# Patient Record
Sex: Male | Born: 1955 | Race: Black or African American | Hispanic: No | Marital: Married | Smoking: Current every day smoker
Health system: Southern US, Community
[De-identification: ages and names within clinical notes are randomized; demographics above are authoritative.]

## PROBLEM LIST (undated history)

## (undated) DIAGNOSIS — Z8719 Personal history of other diseases of the digestive system: Secondary | ICD-10-CM

## (undated) DIAGNOSIS — I517 Cardiomegaly: Secondary | ICD-10-CM

## (undated) DIAGNOSIS — D649 Anemia, unspecified: Secondary | ICD-10-CM

## (undated) DIAGNOSIS — M47812 Spondylosis without myelopathy or radiculopathy, cervical region: Secondary | ICD-10-CM

## (undated) DIAGNOSIS — Z8711 Personal history of peptic ulcer disease: Secondary | ICD-10-CM

## (undated) DIAGNOSIS — Z87891 Personal history of nicotine dependence: Secondary | ICD-10-CM

## (undated) DIAGNOSIS — G5603 Carpal tunnel syndrome, bilateral upper limbs: Secondary | ICD-10-CM

## (undated) DIAGNOSIS — F32A Depression, unspecified: Secondary | ICD-10-CM

## (undated) DIAGNOSIS — N400 Enlarged prostate without lower urinary tract symptoms: Secondary | ICD-10-CM

## (undated) DIAGNOSIS — M75120 Complete rotator cuff tear or rupture of unspecified shoulder, not specified as traumatic: Secondary | ICD-10-CM

## (undated) DIAGNOSIS — N189 Chronic kidney disease, unspecified: Secondary | ICD-10-CM

## (undated) DIAGNOSIS — J45909 Unspecified asthma, uncomplicated: Secondary | ICD-10-CM

## (undated) DIAGNOSIS — F17200 Nicotine dependence, unspecified, uncomplicated: Secondary | ICD-10-CM

## (undated) DIAGNOSIS — F419 Anxiety disorder, unspecified: Secondary | ICD-10-CM

## (undated) DIAGNOSIS — J439 Emphysema, unspecified: Secondary | ICD-10-CM

## (undated) DIAGNOSIS — E785 Hyperlipidemia, unspecified: Secondary | ICD-10-CM

## (undated) DIAGNOSIS — I700 Atherosclerosis of aorta: Secondary | ICD-10-CM

## (undated) DIAGNOSIS — D573 Sickle-cell trait: Secondary | ICD-10-CM

## (undated) DIAGNOSIS — G4730 Sleep apnea, unspecified: Secondary | ICD-10-CM

## (undated) DIAGNOSIS — R200 Anesthesia of skin: Secondary | ICD-10-CM

## (undated) DIAGNOSIS — J449 Chronic obstructive pulmonary disease, unspecified: Secondary | ICD-10-CM

## (undated) DIAGNOSIS — K089 Disorder of teeth and supporting structures, unspecified: Secondary | ICD-10-CM

## (undated) DIAGNOSIS — F329 Major depressive disorder, single episode, unspecified: Secondary | ICD-10-CM

## (undated) HISTORY — DX: Personal history of other diseases of the digestive system: Z87.19

## (undated) HISTORY — DX: Sleep apnea, unspecified: G47.30

## (undated) HISTORY — DX: Complete rotator cuff tear or rupture of unspecified shoulder, not specified as traumatic: M75.120

## (undated) HISTORY — DX: Depression, unspecified: F32.A

## (undated) HISTORY — DX: Anesthesia of skin: R20.0

## (undated) HISTORY — DX: Carpal tunnel syndrome, bilateral upper limbs: G56.03

## (undated) HISTORY — DX: Benign prostatic hyperplasia without lower urinary tract symptoms: N40.0

## (undated) HISTORY — DX: Anemia, unspecified: D64.9

## (undated) HISTORY — PX: SHOULDER ARTHROSCOPY WITH SUBACROMIAL DECOMPRESSION: SHX5684

## (undated) HISTORY — DX: Personal history of peptic ulcer disease: Z87.11

## (undated) HISTORY — DX: Sickle-cell trait: D57.3

## (undated) HISTORY — DX: Major depressive disorder, single episode, unspecified: F32.9

## (undated) HISTORY — DX: Emphysema, unspecified: J43.9

## (undated) HISTORY — DX: Cardiomegaly: I51.7

## (undated) HISTORY — PX: CARPAL TUNNEL RELEASE: SHX101

## (undated) HISTORY — DX: Unspecified asthma, uncomplicated: J45.909

## (undated) HISTORY — DX: Disorder of teeth and supporting structures, unspecified: K08.9

## (undated) HISTORY — DX: Personal history of nicotine dependence: Z87.891

## (undated) HISTORY — DX: Nicotine dependence, unspecified, uncomplicated: F17.200

## (undated) HISTORY — DX: Chronic obstructive pulmonary disease, unspecified: J44.9

## (undated) HISTORY — DX: Spondylosis without myelopathy or radiculopathy, cervical region: M47.812

## (undated) HISTORY — DX: Anxiety disorder, unspecified: F41.9

## (undated) HISTORY — DX: Atherosclerosis of aorta: I70.0

## (undated) HISTORY — DX: Hyperlipidemia, unspecified: E78.5

---

## 2012-08-25 HISTORY — PX: CYSTOURETHROSCOPY: SHX476

## 2012-08-25 HISTORY — PX: TRANSURETHRAL RESECTION OF PROSTATE: SHX73

## 2012-11-30 DIAGNOSIS — N400 Enlarged prostate without lower urinary tract symptoms: Secondary | ICD-10-CM | POA: Insufficient documentation

## 2013-10-03 DIAGNOSIS — F32A Depression, unspecified: Secondary | ICD-10-CM | POA: Insufficient documentation

## 2013-10-03 DIAGNOSIS — F329 Major depressive disorder, single episode, unspecified: Secondary | ICD-10-CM | POA: Insufficient documentation

## 2013-10-03 DIAGNOSIS — G5603 Carpal tunnel syndrome, bilateral upper limbs: Secondary | ICD-10-CM | POA: Insufficient documentation

## 2013-10-03 DIAGNOSIS — F17200 Nicotine dependence, unspecified, uncomplicated: Secondary | ICD-10-CM | POA: Insufficient documentation

## 2014-08-18 DIAGNOSIS — I517 Cardiomegaly: Secondary | ICD-10-CM | POA: Insufficient documentation

## 2014-08-19 DIAGNOSIS — D649 Anemia, unspecified: Secondary | ICD-10-CM | POA: Insufficient documentation

## 2014-09-18 DIAGNOSIS — M75122 Complete rotator cuff tear or rupture of left shoulder, not specified as traumatic: Secondary | ICD-10-CM | POA: Insufficient documentation

## 2014-09-18 DIAGNOSIS — K089 Disorder of teeth and supporting structures, unspecified: Secondary | ICD-10-CM | POA: Insufficient documentation

## 2014-09-18 DIAGNOSIS — M75102 Unspecified rotator cuff tear or rupture of left shoulder, not specified as traumatic: Secondary | ICD-10-CM | POA: Insufficient documentation

## 2014-11-28 DIAGNOSIS — M25519 Pain in unspecified shoulder: Secondary | ICD-10-CM | POA: Insufficient documentation

## 2015-01-09 DIAGNOSIS — G5600 Carpal tunnel syndrome, unspecified upper limb: Secondary | ICD-10-CM | POA: Insufficient documentation

## 2015-01-09 DIAGNOSIS — R200 Anesthesia of skin: Secondary | ICD-10-CM | POA: Insufficient documentation

## 2015-01-09 DIAGNOSIS — M47812 Spondylosis without myelopathy or radiculopathy, cervical region: Secondary | ICD-10-CM | POA: Insufficient documentation

## 2015-01-16 DIAGNOSIS — D573 Sickle-cell trait: Secondary | ICD-10-CM | POA: Insufficient documentation

## 2015-01-25 HISTORY — PX: SHOULDER ARTHROSCOPY WITH SUBACROMIAL DECOMPRESSION: SHX5684

## 2015-01-25 HISTORY — PX: ROTATOR CUFF REPAIR: SHX139

## 2015-08-06 ENCOUNTER — Other Ambulatory Visit: Payer: Self-pay | Admitting: Dentistry

## 2015-08-06 ENCOUNTER — Ambulatory Visit
Admission: RE | Admit: 2015-08-06 | Discharge: 2015-08-06 | Disposition: A | Discharge status: Home / Self Care | Payer: Disability Insurance | Source: Ambulatory Visit | Attending: Dentistry | Admitting: Dentistry

## 2015-08-06 DIAGNOSIS — M25512 Pain in left shoulder: Secondary | ICD-10-CM | POA: Diagnosis present

## 2015-08-06 DIAGNOSIS — M25511 Pain in right shoulder: Secondary | ICD-10-CM

## 2015-08-06 DIAGNOSIS — M19012 Primary osteoarthritis, left shoulder: Secondary | ICD-10-CM | POA: Diagnosis not present

## 2015-08-06 DIAGNOSIS — M19011 Primary osteoarthritis, right shoulder: Secondary | ICD-10-CM | POA: Insufficient documentation

## 2015-08-24 ENCOUNTER — Other Ambulatory Visit: Payer: Self-pay | Admitting: Dentistry

## 2015-08-24 ENCOUNTER — Ambulatory Visit
Admission: RE | Admit: 2015-08-24 | Discharge: 2015-08-24 | Disposition: A | Discharge status: Home / Self Care | Payer: Disability Insurance | Source: Ambulatory Visit | Attending: Dentistry | Admitting: Dentistry

## 2015-08-24 DIAGNOSIS — N419 Inflammatory disease of prostate, unspecified: Secondary | ICD-10-CM | POA: Diagnosis present

## 2015-08-24 DIAGNOSIS — M419 Scoliosis, unspecified: Secondary | ICD-10-CM | POA: Diagnosis not present

## 2015-08-24 DIAGNOSIS — I700 Atherosclerosis of aorta: Secondary | ICD-10-CM | POA: Insufficient documentation

## 2015-08-24 DIAGNOSIS — G43909 Migraine, unspecified, not intractable, without status migrainosus: Secondary | ICD-10-CM | POA: Diagnosis present

## 2015-11-01 ENCOUNTER — Encounter: Payer: Self-pay | Admitting: Family Medicine

## 2015-11-01 ENCOUNTER — Ambulatory Visit (INDEPENDENT_AMBULATORY_CARE_PROVIDER_SITE_OTHER): Payer: Medicaid Other | Admitting: Family Medicine

## 2015-11-01 VITALS — BP 131/76 | HR 80 | Temp 97.3°F | Ht 68.75 in | Wt 167.0 lb

## 2015-11-01 DIAGNOSIS — R972 Elevated prostate specific antigen [PSA]: Secondary | ICD-10-CM | POA: Insufficient documentation

## 2015-11-01 DIAGNOSIS — M75122 Complete rotator cuff tear or rupture of left shoulder, not specified as traumatic: Secondary | ICD-10-CM

## 2015-11-01 DIAGNOSIS — Z5181 Encounter for therapeutic drug level monitoring: Secondary | ICD-10-CM | POA: Diagnosis not present

## 2015-11-01 DIAGNOSIS — D508 Other iron deficiency anemias: Secondary | ICD-10-CM

## 2015-11-01 DIAGNOSIS — Z720 Tobacco use: Secondary | ICD-10-CM

## 2015-11-01 DIAGNOSIS — M21612 Bunion of left foot: Secondary | ICD-10-CM | POA: Insufficient documentation

## 2015-11-01 DIAGNOSIS — F329 Major depressive disorder, single episode, unspecified: Secondary | ICD-10-CM

## 2015-11-01 DIAGNOSIS — E785 Hyperlipidemia, unspecified: Secondary | ICD-10-CM

## 2015-11-01 DIAGNOSIS — Z1211 Encounter for screening for malignant neoplasm of colon: Secondary | ICD-10-CM | POA: Diagnosis not present

## 2015-11-01 DIAGNOSIS — K089 Disorder of teeth and supporting structures, unspecified: Secondary | ICD-10-CM

## 2015-11-01 DIAGNOSIS — N400 Enlarged prostate without lower urinary tract symptoms: Secondary | ICD-10-CM | POA: Diagnosis not present

## 2015-11-01 DIAGNOSIS — Z23 Encounter for immunization: Secondary | ICD-10-CM | POA: Diagnosis not present

## 2015-11-01 DIAGNOSIS — G5601 Carpal tunnel syndrome, right upper limb: Secondary | ICD-10-CM

## 2015-11-01 DIAGNOSIS — F32A Depression, unspecified: Secondary | ICD-10-CM

## 2015-11-01 DIAGNOSIS — F17200 Nicotine dependence, unspecified, uncomplicated: Secondary | ICD-10-CM

## 2015-11-01 MED ORDER — ESCITALOPRAM OXALATE 10 MG PO TABS: 10.0000 mg | ORAL_TABLET | Freq: Every day | ORAL | Status: DC

## 2015-11-01 MED ORDER — ATORVASTATIN CALCIUM 10 MG PO TABS: 10.0000 mg | ORAL_TABLET | Freq: Every day | ORAL | Status: DC

## 2015-11-01 MED ORDER — BUPROPION HCL ER (XL) 150 MG PO TB24: 150.0000 mg | ORAL_TABLET | Freq: Every day | ORAL | Status: DC

## 2015-11-01 NOTE — Assessment & Plan Note (Signed)
will get him a brace; refer to orthopaedic hand specialist

## 2015-11-01 NOTE — Assessment & Plan Note (Addendum)
Refer to urologist; patient opted to skip exam here today (DRE) and will just have the specialist evaluate and monitor him; check PSA when he returns for other labs in the next few days

## 2015-11-01 NOTE — Assessment & Plan Note (Addendum)
Check CBC today and ferritin 

## 2015-11-01 NOTE — Assessment & Plan Note (Signed)
Refer to oral surgeon for extraction, will need new dentures

## 2015-11-01 NOTE — Progress Notes (Signed)
BP 131/76 mmHg  Pulse 80  Temp(Src) 97.3 F (36.3 C)  Ht 5' 8.75" (1.746 m)  Wt 167 lb (75.751 kg)  BMI 24.85 kg/m2  SpO2 99%   Subjective:    Patient ID: Manuel [name], male    DOB: Aug 12, 1956, 59 y.o.   MRN: [id number]  HPI: Manuel [name] is a 59 y.o. male  Chief Complaint  Patient presents with  . Establish Care  . Referral    he is due for a colonoscopy, never had one.   He is here to establish care  He went in for a prostate check, was seeing Dr. Marcello Moores at the cancer center; he has not been back, and he wants to keep a check on it; he would prefer new specialist check his prostate (he declined exam by MD here today); we were able to pull up his old labs in [facility]; in October 2015, his PSA was 4.67, prior to that 4.43 in January 2015; due 6 months after October 2015 for DRE and PSA (which he has missed); he is still having a hard time urinating, still having a hard time urinating ;libido decreased  High cholesterol; just had a sandwich so he's not fasting right now; he has been on statin for about a year; he has been stretching out medicine, not truly on 10 mg daily  Some restlessness at night in his arms and legs at night; hx of anemia  Had rotator cuff, left still bothering him, he had surgery on the right shoulder, is still needing surgery on the left he tells me  Has depression; was on medicine for bipolar but he doesn't have bipolar disorder; it was just a medicine that they sometimes give to people with bipolar d/o but he was on it (to the best he can tell me); he does not know the name of it; never hospitalized for depression, but has seen multiple psychiatrists; can't remember the last medicine; was about 4 years ago since has taken anything  He has never had a colonoscopy and is open to getting that scheduled  Relevant past medical, surgical, family and social history reviewed and updated as indicated  Meds ordered this encounter  Medications  .  omeprazole (PRILOSEC) 20 MG capsule    Sig: Take 20 mg by mouth daily.  . finasteride (PROPECIA) 1 MG tablet    Sig: Take 1 mg by mouth daily.  . promethazine (PHENERGAN) 25 MG tablet    Sig: Take 25 mg by mouth every 6 (six) hours as needed for nausea or vomiting.  . folic acid (FOLVITE) 1 MG tablet    Sig: Take 1 mg by mouth daily.  Marland Kitchen DISCONTD: atorvastatin (LIPITOR) 10 MG tablet    Sig: Take 10 mg by mouth at bedtime.  . tamsulosin (FLOMAX) 0.4 MG CAPS capsule    Sig: Take 0.4 mg by mouth daily.  Marland Kitchen escitalopram (LEXAPRO) 10 MG tablet    Sig: Take 1 tablet (10 mg total) by mouth daily.    Dispense:  30 tablet    Refill:  0  . atorvastatin (LIPITOR) 10 MG tablet    Sig: Take 1 tablet (10 mg total) by mouth at bedtime.    Dispense:  30 tablet    Refill:  0  . buPROPion (WELLBUTRIN XL) 150 MG 24 hr tablet    Sig: Take 1 tablet (150 mg total) by mouth daily.    Dispense:  30 tablet    Refill:  2   Past  Medical History  Diagnosis Date  . Anemia   . Depression   . Sickle cell trait ([place])   . Bilateral hand numbness   . Cervical spondylosis without myelopathy   . Complete tear of rotator cuff     bilateral  . LVH (left ventricular hypertrophy)   . BPH without urinary obstruction   . COPD (chronic obstructive pulmonary disease) ([place])   . Smoker   . Poor dentition   . Bilateral carpal tunnel syndrome     Past Surgical History  Procedure Laterality Date  . Cystourethroscopy  08/25/12    with ureteral cathization w/wo retrograde pyelogram  . Transurethral resection of prostate  08/25/12  . Rotator cuff repair Right 01/25/15  . Shoulder arthroscopy with subacromial decompression Right [id number]   Family History  Problem Relation Age of Onset  . Aneurysm Mother   . Hypertension Mother   . Diabetes Father   . Hypertension Father   . Cancer Maternal Aunt   . Cancer Maternal Uncle   . Diabetes Paternal Uncle   . COPD Neg Hx   . Stroke Neg Hx   . Heart disease Cousin   Leg  amputations in two family members; may have smoked earlier  Allergies and medications reviewed and updated.  Review of Systems Per HPI unless specifically indicated above     Objective:    BP 131/76 mmHg  Pulse 80  Temp(Src) 97.3 F (36.3 C)  Ht 5' 8.75" (1.746 m)  Wt 167 lb (75.751 kg)  BMI 24.85 kg/m2  SpO2 99%  Wt Readings from Last 3 Encounters:  11/01/15 167 lb (75.751 kg)    Physical Exam  Constitutional: He appears well-developed and well-nourished. No distress.  HENT:  Head: Normocephalic and atraumatic.  Mouth/Throat: Abnormal dentition. Dental caries present. No oropharyngeal exudate.  Eyes: EOM are normal. No scleral icterus.  Neck: No thyromegaly present.  Cardiovascular: Normal rate and regular rhythm.   Pulmonary/Chest: Effort normal and breath sounds normal.  Abdominal: He exhibits no distension.  Musculoskeletal: He exhibits no edema.       Left shoulder: He exhibits decreased range of motion and tenderness.       Left foot: There is deformity (bunion left foot).  Neurological: He is alert.  Skin: There is pallor (nailbeds are pale relative to examiner).  Psychiatric: He has a normal mood and affect. His behavior is normal. Judgment and thought content normal.      Assessment & Plan:   Problem List Items Addressed This Visit      Digestive   Dental disease    Refer to oral surgeon for extraction, will need new dentures      Relevant Orders   Ambulatory referral to Oral Maxillofacial Surgery     Nervous and Auditory   Carpal tunnel syndrome    will get him a brace; refer to orthopaedic hand specialist      Relevant Medications   escitalopram (LEXAPRO) 10 MG tablet   buPROPion (WELLBUTRIN XL) 150 MG 24 hr tablet   Other Relevant Orders   Ambulatory referral to Orthopedic Surgery     Musculoskeletal and Integument   Complete rotator cuff rupture of left shoulder    Refer to orthopaedist      Relevant Orders   Ambulatory referral to  Orthopedic Surgery   Bunion, left foot    Refer to podiatrist; left foot is the most affected, but he has smaller bunion on the right as well      Relevant  Orders   Ambulatory referral to Podiatry     Genitourinary   BPH (benign prostatic hyperplasia)   Relevant Medications   tamsulosin (FLOMAX) 0.4 MG CAPS capsule   Benign prostatic hypertrophy without urinary obstruction    Refer to urologist; patient opted to skip exam here today (DRE) and will just have the specialist evaluate and monitor him; check PSA when he returns for other labs in the next few days      Relevant Medications   tamsulosin (FLOMAX) 0.4 MG CAPS capsule   Other Relevant Orders   Ambulatory referral to Urology   PSA (Completed)     Other   Clinical depression    Will start him on medication today with close f/u; he denies true bipolar d/o, but will watch for mania or hypomania, obviously; call before next appt if needed      Relevant Medications   escitalopram (LEXAPRO) 10 MG tablet   buPROPion (WELLBUTRIN XL) 150 MG 24 hr tablet   Current smoker    encourgement given to quit; start wellbutrin      Absolute anemia    Check CBC today and ferritin      Relevant Medications   folic acid (FOLVITE) 1 MG tablet   Other Relevant Orders   CBC with Differential/Platelet (Completed)   Ferritin (Completed)   Elevated PSA   Relevant Orders   Ambulatory referral to Urology   PSA (Completed)   Hyperlipidemia - Primary    Patient will return for fasting lipids in the next few days; he has not been truly on full dose of prescribed statin, having had to stretch the medicine out since he was seen last by his previous provider      Relevant Medications   atorvastatin (LIPITOR) 10 MG tablet   Other Relevant Orders   Lipid Panel w/o Chol/HDL Ratio (Completed)   Medication monitoring encounter   Relevant Orders   Comprehensive metabolic panel (Completed)   Colon cancer screening    Refer to gastroenterologist  for screening colonoscopy      Relevant Orders   Ambulatory referral to Gastroenterology    Other Visit Diagnoses    Needs flu shot        flu vaccine given today    Encounter for immunization           Follow up plan: Return 3-4 weeks, for complete physical and mood recheck, 45 minutes.  An after-visit summary was printed and given to the patient at [email or web address].  Please see the patient instructions which may contain other information and recommendations beyond what is mentioned above in the assessment and plan.  Face-to-face time with patient was more than 30 minutes, >50% time spent counseling and coordination of care  Meds ordered this encounter  Medications  . omeprazole (PRILOSEC) 20 MG capsule    Sig: Take 20 mg by mouth daily.  . finasteride (PROPECIA) 1 MG tablet    Sig: Take 1 mg by mouth daily.  . promethazine (PHENERGAN) 25 MG tablet    Sig: Take 25 mg by mouth every 6 (six) hours as needed for nausea or vomiting.  . folic acid (FOLVITE) 1 MG tablet    Sig: Take 1 mg by mouth daily.  Marland Kitchen DISCONTD: atorvastatin (LIPITOR) 10 MG tablet    Sig: Take 10 mg by mouth at bedtime.  . tamsulosin (FLOMAX) 0.4 MG CAPS capsule    Sig: Take 0.4 mg by mouth daily.  Marland Kitchen escitalopram (LEXAPRO) 10 MG tablet  Sig: Take 1 tablet (10 mg total) by mouth daily.    Dispense:  30 tablet    Refill:  0  . atorvastatin (LIPITOR) 10 MG tablet    Sig: Take 1 tablet (10 mg total) by mouth at bedtime.    Dispense:  30 tablet    Refill:  0  . buPROPion (WELLBUTRIN XL) 150 MG 24 hr tablet    Sig: Take 1 tablet (150 mg total) by mouth daily.    Dispense:  30 tablet    Refill:  2    Orders Placed This Encounter  Procedures  . Flu Vaccine QUAD 36+ mos IM  . PSA  . CBC with Differential/Platelet  . Comprehensive metabolic panel  . Lipid Panel w/o Chol/HDL Ratio  . Ferritin  . Ambulatory referral to Urology  . Ambulatory referral to Orthopedic Surgery  . Ambulatory referral to Oral  Maxillofacial Surgery  . Ambulatory referral to Gastroenterology  . Ambulatory referral to Podiatry

## 2015-11-01 NOTE — Patient Instructions (Addendum)
Start the escitalopram 10 mg daily Continue the other medicine We'll get labs TOMORROW (December 30th) Return in 3-4 weeks for a 45 minute complete physical and medication check Good luck with smoking cessation Start the bupropion to help you quit smoking   Smoking Cessation, Tips for Success If you are ready to quit smoking, congratulations! You have chosen to help yourself be healthier. Cigarettes bring nicotine, tar, carbon monoxide, and other irritants into your body. Your lungs, heart, and blood vessels will be able to work better without these poisons. There are many different ways to quit smoking. Nicotine gum, nicotine patches, a nicotine inhaler, or nicotine nasal spray can help with physical craving. Hypnosis, support groups, and medicines help break the habit of smoking. WHAT THINGS CAN I DO TO MAKE QUITTING EASIER?  Here are some tips to help you quit for good:  Pick a date when you will quit smoking completely. Tell all of your friends and family about your plan to quit on that date.  Do not try to slowly cut down on the number of cigarettes you are smoking. Pick a quit date and quit smoking completely starting on that day.  Throw away all cigarettes.   Clean and remove all ashtrays from your home, work, and car.  On a card, write down your reasons for quitting. Carry the card with you and read it when you get the urge to smoke.  Cleanse your body of nicotine. Drink enough water and fluids to keep your urine clear or pale yellow. Do this after quitting to flush the nicotine from your body.  Learn to predict your moods. Do not let a bad situation be your excuse to have a cigarette. Some situations in your life might tempt you into wanting a cigarette.  Never have "just one" cigarette. It leads to wanting another and another. Remind yourself of your decision to quit.  Change habits associated with smoking. If you smoked while driving or when feeling stressed, try other  activities to replace smoking. Stand up when drinking your coffee. Brush your teeth after eating. Sit in a different chair when you read the paper. Avoid alcohol while trying to quit, and try to drink fewer caffeinated beverages. Alcohol and caffeine may urge you to smoke.  Avoid foods and drinks that can trigger a desire to smoke, such as sugary or spicy foods and alcohol.  Ask people who smoke not to smoke around you.  Have something planned to do right after eating or having a cup of coffee. For example, plan to take a walk or exercise.  Try a relaxation exercise to calm you down and decrease your stress. Remember, you may be tense and nervous for the first 2 weeks after you quit, but this will pass.  Find new activities to keep your hands busy. Play with a pen, coin, or rubber band. Doodle or draw things on paper.  Brush your teeth right after eating. This will help cut down on the craving for the taste of tobacco after meals. You can also try mouthwash.   Use oral substitutes in place of cigarettes. Try using lemon drops, carrots, cinnamon sticks, or chewing gum. Keep them handy so they are available when you have the urge to smoke.  When you have the urge to smoke, try deep breathing.  Designate your home as a nonsmoking area.  If you are a heavy smoker, ask your health care provider about a prescription for nicotine chewing gum. It can ease your withdrawal  from nicotine.  Reward yourself. Set aside the cigarette money you save and buy yourself something nice.  Look for support from others. Join a support group or smoking cessation program. Ask someone at home or at work to help you with your plan to quit smoking.  Always ask yourself, "Do I need this cigarette or is this just a reflex?" Tell yourself, "Today, I choose not to smoke," or "I do not want to smoke." You are reminding yourself of your decision to quit.  Do not replace cigarette smoking with electronic cigarettes  (commonly called e-cigarettes). The safety of e-cigarettes is unknown, and some may contain harmful chemicals.  If you relapse, do not give up! Plan ahead and think about what you will do the next time you get the urge to smoke. HOW WILL I FEEL WHEN I QUIT SMOKING? You may have symptoms of withdrawal because your body is used to nicotine (the addictive substance in cigarettes). You may crave cigarettes, be irritable, feel very hungry, cough often, get headaches, or have difficulty concentrating. The withdrawal symptoms are only temporary. They are strongest when you first quit but will go away within 10-14 days. When withdrawal symptoms occur, stay in control. Think about your reasons for quitting. Remind yourself that these are signs that your body is healing and getting used to being without cigarettes. Remember that withdrawal symptoms are easier to treat than the major diseases that smoking can cause.  Even after the withdrawal is over, expect periodic urges to smoke. However, these cravings are generally short lived and will go away whether you smoke or not. Do not smoke! WHAT RESOURCES ARE AVAILABLE TO HELP ME QUIT SMOKING? Your health care provider can direct you to community resources or hospitals for support, which may include:  Group support.  Education.  Hypnosis.  Therapy.   This information is not intended to replace advice given to you by your health care provider. Make sure you discuss any questions you have with your health care provider.   Document Released: 07/18/2004 Document Revised: 11/10/2014 Document Reviewed: 04/07/2013 Elsevier Interactive Patient Education [employer].

## 2015-11-01 NOTE — Assessment & Plan Note (Signed)
Refer to orthopaedist 

## 2015-11-01 NOTE — Assessment & Plan Note (Addendum)
encourgement given to quit; start wellbutrin

## 2015-11-02 ENCOUNTER — Other Ambulatory Visit: Payer: Medicaid Other

## 2015-11-02 DIAGNOSIS — D508 Other iron deficiency anemias: Secondary | ICD-10-CM

## 2015-11-02 DIAGNOSIS — Z5181 Encounter for therapeutic drug level monitoring: Secondary | ICD-10-CM

## 2015-11-02 DIAGNOSIS — E785 Hyperlipidemia, unspecified: Secondary | ICD-10-CM

## 2015-11-02 DIAGNOSIS — R972 Elevated prostate specific antigen [PSA]: Secondary | ICD-10-CM

## 2015-11-02 DIAGNOSIS — N400 Enlarged prostate without lower urinary tract symptoms: Secondary | ICD-10-CM

## 2015-11-03 LAB — FERRITIN: Ferritin: 33 ng/mL (ref 30–400)

## 2015-11-03 LAB — CBC WITH DIFFERENTIAL/PLATELET
BASOS ABS: 0 10*3/uL (ref 0.0–0.2)
Basos: 1 %
EOS (ABSOLUTE): 0.1 10*3/uL (ref 0.0–0.4)
Eos: 2 %
HEMATOCRIT: 40 % (ref 37.5–51.0)
Hemoglobin: 13.8 g/dL (ref 12.6–17.7)
Immature Grans (Abs): 0 10*3/uL (ref 0.0–0.1)
Immature Granulocytes: 0 %
LYMPHS ABS: 0.8 10*3/uL (ref 0.7–3.1)
Lymphs: 16 %
MCH: 25.8 pg — AB (ref 26.6–33.0)
MCHC: 34.5 g/dL (ref 31.5–35.7)
MCV: 75 fL — ABNORMAL LOW (ref 79–97)
MONOS ABS: 0.5 10*3/uL (ref 0.1–0.9)
Monocytes: 11 %
NEUTROS ABS: 3.3 10*3/uL (ref 1.4–7.0)
Neutrophils: 70 %
Platelets: 201 10*3/uL (ref 150–379)
RBC: 5.35 x10E6/uL (ref 4.14–5.80)
RDW: 15 % (ref 12.3–15.4)
WBC: 4.7 10*3/uL (ref 3.4–10.8)

## 2015-11-03 LAB — COMPREHENSIVE METABOLIC PANEL
ALK PHOS: 83 IU/L (ref 39–117)
ALT: 14 IU/L (ref 0–44)
AST: 19 IU/L (ref 0–40)
Albumin/Globulin Ratio: 1.6 (ref 1.1–2.5)
Albumin: 4 g/dL (ref 3.5–5.5)
BILIRUBIN TOTAL: 0.4 mg/dL (ref 0.0–1.2)
BUN / CREAT RATIO: 11 (ref 9–20)
BUN: 13 mg/dL (ref 6–24)
CHLORIDE: 99 mmol/L (ref 96–106)
CO2: 28 mmol/L (ref 18–29)
CREATININE: 1.22 mg/dL (ref 0.76–1.27)
Calcium: 9 mg/dL (ref 8.7–10.2)
GFR calc Af Amer: 76 mL/min/{1.73_m2} (ref >59)
GFR calc non Af Amer: 65 mL/min/{1.73_m2} (ref >59)
GLOBULIN, TOTAL: 2.5 g/dL (ref 1.5–4.5)
GLUCOSE: 95 mg/dL (ref 65–99)
Potassium: 4 mmol/L (ref 3.5–5.2)
SODIUM: 142 mmol/L (ref 134–144)
Total Protein: 6.5 g/dL (ref 6.0–8.5)

## 2015-11-03 LAB — PSA: PROSTATE SPECIFIC AG, SERUM: 4.8 ng/mL — AB (ref 0.0–4.0)

## 2015-11-03 LAB — LIPID PANEL W/O CHOL/HDL RATIO
CHOLESTEROL TOTAL: 208 mg/dL — AB (ref 100–199)
HDL: 47 mg/dL (ref >39)
LDL CALC: 112 mg/dL — AB (ref 0–99)
TRIGLYCERIDES: 247 mg/dL — AB (ref 0–149)
VLDL Cholesterol Cal: 49 mg/dL — ABNORMAL HIGH (ref 5–40)

## 2015-11-06 ENCOUNTER — Telehealth: Payer: Self-pay | Admitting: Family Medicine

## 2015-11-06 DIAGNOSIS — M545 Low back pain, unspecified: Secondary | ICD-10-CM

## 2015-11-06 DIAGNOSIS — G8929 Other chronic pain: Secondary | ICD-10-CM

## 2015-11-06 NOTE — Telephone Encounter (Signed)
I called about labs, PSA elevated I'll try him back later

## 2015-11-07 NOTE — Telephone Encounter (Signed)
He returned your call, said it's best to reach him after 11:30am.

## 2015-11-07 NOTE — Assessment & Plan Note (Signed)
Refer to gastroenterologist for screening colonoscopy

## 2015-11-07 NOTE — Assessment & Plan Note (Signed)
Patient will return for fasting lipids in the next few days; he has not been truly on full dose of prescribed statin, having had to stretch the medicine out since he was seen last by his previous provider

## 2015-11-07 NOTE — Assessment & Plan Note (Signed)
Will start him on medication today with close f/u; he denies true bipolar d/o, but will watch for mania or hypomania, obviously; call before next appt if needed

## 2015-11-07 NOTE — Assessment & Plan Note (Signed)
Refer to podiatrist; left foot is the most affected, but he has smaller bunion on the right as well

## 2015-11-09 DIAGNOSIS — M545 Low back pain, unspecified: Secondary | ICD-10-CM | POA: Insufficient documentation

## 2015-11-09 DIAGNOSIS — G8929 Other chronic pain: Secondary | ICD-10-CM | POA: Insufficient documentation

## 2015-11-09 MED ORDER — ATORVASTATIN CALCIUM 10 MG PO TABS: 10.0000 mg | ORAL_TABLET | Freq: Every day | ORAL | Status: DC

## 2015-11-09 NOTE — Telephone Encounter (Signed)
I spoke with patient; get back on atorvastatin 10 mg nightly; I sent Rx He is having disc problems, lower back pain, he'd like to see specialist; referral entered I don't think dental referral will be covered by his insurance; several suggestions made

## 2015-11-09 NOTE — Telephone Encounter (Signed)
I left brief msg, calling about labs 

## 2015-11-12 ENCOUNTER — Telehealth: Payer: Self-pay | Admitting: Gastroenterology

## 2015-11-12 NOTE — Telephone Encounter (Signed)
Patient left voice message to set up appointment for a colonoscopy. Please call him after 11:30 M-F to schedule.

## 2015-11-13 ENCOUNTER — Encounter: Payer: Self-pay | Admitting: Urology

## 2015-11-13 ENCOUNTER — Other Ambulatory Visit: Payer: Self-pay

## 2015-11-13 ENCOUNTER — Ambulatory Visit (INDEPENDENT_AMBULATORY_CARE_PROVIDER_SITE_OTHER): Payer: Medicaid Other | Admitting: Urology

## 2015-11-13 ENCOUNTER — Ambulatory Visit: Payer: Medicaid Other | Admitting: Urology

## 2015-11-13 VITALS — BP 118/71 | HR 80 | Ht 70.0 in | Wt 167.0 lb

## 2015-11-13 DIAGNOSIS — Z87448 Personal history of other diseases of urinary system: Secondary | ICD-10-CM

## 2015-11-13 DIAGNOSIS — N401 Enlarged prostate with lower urinary tract symptoms: Secondary | ICD-10-CM

## 2015-11-13 DIAGNOSIS — N289 Disorder of kidney and ureter, unspecified: Secondary | ICD-10-CM | POA: Diagnosis not present

## 2015-11-13 DIAGNOSIS — N138 Other obstructive and reflux uropathy: Secondary | ICD-10-CM

## 2015-11-13 DIAGNOSIS — R972 Elevated prostate specific antigen [PSA]: Secondary | ICD-10-CM | POA: Diagnosis not present

## 2015-11-13 LAB — URINALYSIS, COMPLETE
Bilirubin, UA: NEGATIVE
GLUCOSE, UA: NEGATIVE
Ketones, UA: NEGATIVE
Nitrite, UA: NEGATIVE
PH UA: 5.5 (ref 5.0–7.5)
RBC, UA: NEGATIVE
Specific Gravity, UA: 1.025 (ref 1.005–1.030)
UUROB: 0.2 mg/dL (ref 0.2–1.0)

## 2015-11-13 LAB — MICROSCOPIC EXAMINATION: RBC MICROSCOPIC, UA: NONE SEEN /HPF (ref 0–?)

## 2015-11-13 MED ORDER — FINASTERIDE 5 MG PO TABS: 5.0000 mg | ORAL_TABLET | Freq: Every day | ORAL | Status: DC

## 2015-11-13 MED ORDER — TAMSULOSIN HCL 0.4 MG PO CAPS: 0.4000 mg | ORAL_CAPSULE | Freq: Every day | ORAL | Status: DC

## 2015-11-13 NOTE — Telephone Encounter (Signed)
Gastroenterology Pre-Procedure Review  Request Date:12-25-15 Requesting Physician: Dr.   PATIENT REVIEW QUESTIONS: The patient responded to the following health history questions as indicated:    1. Are you having any GI issues? no 2. Do you have a personal history of Polyps? no 3. Do you have a family history of Colon Cancer or Polyps? no 4. Diabetes Mellitus? no 5. Joint replacements in the past 12 months?no 6. Major health problems in the past 3 months?no 7. Any artificial heart valves, MVP, or defibrillator?no    MEDICATIONS & ALLERGIES:    Patient reports the following regarding taking any anticoagulation/antiplatelet therapy:   Plavix, Coumadin, Eliquis, Xarelto, Lovenox, Pradaxa, Brilinta, or Effient? no Aspirin? yes (sometimes for apin in shoulder)  Patient confirms/reports the following medications:  Current Outpatient Prescriptions  Medication Sig Dispense Refill   atorvastatin (LIPITOR) 10 MG tablet Take 1 tablet (10 mg total) by mouth at bedtime. 30 tablet 4   buPROPion (WELLBUTRIN XL) 150 MG 24 hr tablet Take 1 tablet (150 mg total) by mouth daily. 30 tablet 2   escitalopram (LEXAPRO) 10 MG tablet Take 1 tablet (10 mg total) by mouth daily. 30 tablet 0   finasteride (PROPECIA) 1 MG tablet Take 1 mg by mouth daily.     folic acid (FOLVITE) 1 MG tablet Take 1 mg by mouth daily.     omeprazole (PRILOSEC) 20 MG capsule Take 20 mg by mouth daily.     promethazine (PHENERGAN) 25 MG tablet Take 25 mg by mouth every 6 (six) hours as needed for nausea or vomiting.     tamsulosin (FLOMAX) 0.4 MG CAPS capsule Take 0.4 mg by mouth daily.     No current facility-administered medications for this visit.    Patient confirms/reports the following allergies:  Allergies  Allergen Reactions   Penicillin G Other (See Comments)    No orders of the defined types were placed in this encounter.    AUTHORIZATION INFORMATION Primary Insurance: 1D#: Group #:  Secondary  Insurance: 1D#: Group #:  SCHEDULE INFORMATION: Date: 12-25-15 Time: Location:[facility]

## 2015-11-13 NOTE — Progress Notes (Signed)
11/13/2015 4:38 PM   Manuel [name] 1956/10/10 BP:[id number]  Referring provider: Arnetha Courser, MD [address] [place], [place] [address]  Chief Complaint  Patient presents with  . Benign Prostatic Hypertrophy    referred by Dr. Len Childs    ? [place] patient needs refills on finasteride and Tamsulsoin    HPI: Patient is a 60 year old African American male with a history of gross hematuria, erectile dysfunction and BPH with LUTS who presents today with his wife, Kathlee Nations, to establish care with a local urologist.  History of gross hematuria Patient had an episode of clot retention in 2013.  He underwent a CT Urogram, cystoscopy with bilateral retrogrades and ultmately a TURP with Dr. Darcus Austin at [facility].  No malignancies were discovered.  Hematuria was from his enlarged, friable prostate.  He has not had any gross hematuria since that time.  A too small to characterize lesion was seen in the left midpole of the kidney on the CT Urogram in 2013.    Erectile dysfunction He has been having difficulty with erections since starting the finasteride.   His major complaint is achieving a firm enough erections.  His libido is preserved.   His risk factors for ED are hyperlipidemia, depression, age, BPH and smoking.  He denies any painful erections or curvatures with his erections.   His wife states their sex life it "great."    BPH WITH LUTS His IPSS score today is 12, which is moderate lower urinary tract symptomatology. He is unhappy with his quality life due to his urinary symptoms.  His major complaint today a weak urinary stream.  He has had these symptoms for over three years.  He denies any dysuria, hematuria or suprapubic pain.    He is currently taking tamsulosin and finasteride, but he is taking it sporadically because he needs a refill.  His has had a TURP.  He also denies any recent fevers, chills, nausea or vomiting.   He has a family history of PCa, with father and paternal  father with prostate cancer.         IPSS      11/13/15 1400       International Prostate Symptom Score   How often have you had the sensation of not emptying your bladder? About half the time     How often have you had to urinate less than every two hours? About half the time     How often have you found you stopped and started again several times when you urinated? Less than 1 in 5 times     How often have you found it difficult to postpone urination? Less than 1 in 5 times     How often have you had a weak urinary stream? About half the time     How often have you had to strain to start urination? Not at All     How many times did you typically get up at night to urinate? 1 Time     Total IPSS Score 12     Quality of Life due to urinary symptoms   If you were to spend the rest of your life with your urinary condition just the way it is now how would you feel about that? Unhappy        Score:  1-7 Mild 8-19 Moderate 20-35 Severe   PMH: Past Medical History  Diagnosis Date  . Anemia   . Depression   .  Sickle cell trait ([place])   . Bilateral hand numbness   . Cervical spondylosis without myelopathy   . Complete tear of rotator cuff     bilateral  . LVH (left ventricular hypertrophy)   . BPH without urinary obstruction   . COPD (chronic obstructive pulmonary disease) ([place])   . Smoker   . Poor dentition   . Bilateral carpal tunnel syndrome   . Anxiety   . Asthma   . HLD (hyperlipidemia)   . Sleep apnea   . History of stomach ulcers     Surgical History: Past Surgical History  Procedure Laterality Date  . Cystourethroscopy  08/25/12    with ureteral cathization w/wo retrograde pyelogram  . Transurethral resection of prostate  08/25/12  . Rotator cuff repair Right 01/25/15  . Shoulder arthroscopy with subacromial decompression Right [id number]    Home Medications:    Medication List       This list is accurate as of: 11/13/15  4:38 PM.  Always use your most  recent med list.               atorvastatin 10 MG tablet  Commonly known as:  LIPITOR  Take 1 tablet (10 mg total) by mouth at bedtime.     buPROPion 150 MG 24 hr tablet  Commonly known as:  WELLBUTRIN XL  Take 1 tablet (150 mg total) by mouth daily.     escitalopram 10 MG tablet  Commonly known as:  LEXAPRO  Take 1 tablet (10 mg total) by mouth daily.     finasteride 5 MG tablet  Commonly known as:  PROSCAR  Take 1 tablet (5 mg total) by mouth daily.     folic acid 1 MG tablet  Commonly known as:  FOLVITE  Take 1 mg by mouth daily.     omeprazole 20 MG capsule  Commonly known as:  PRILOSEC  Take 20 mg by mouth daily.     promethazine 25 MG tablet  Commonly known as:  PHENERGAN  Take 25 mg by mouth every 6 (six) hours as needed for nausea or vomiting. Reported on 11/13/2015     tamsulosin 0.4 MG Caps capsule  Commonly known as:  FLOMAX  Take 1 capsule (0.4 mg total) by mouth daily.        Allergies:  Allergies  Allergen Reactions  . Penicillin G Other (See Comments)    Family History: Family History  Problem Relation Age of Onset  . Aneurysm Mother   . Hypertension Mother   . Diabetes Father   . Hypertension Father   . Cancer Maternal Aunt   . Cancer Maternal Uncle   . Diabetes Paternal Uncle   . COPD Neg Hx   . Stroke Neg Hx   . Heart disease Cousin   . Kidney disease Neg Hx   . Prostate cancer Father   . Prostate cancer Paternal Uncle     Social History:  reports that he has been smoking Cigarettes.  He has a 12.5 pack-year smoking history. He has never used smokeless tobacco. He reports that he does not drink alcohol or use illicit drugs.  ROS: UROLOGY Frequent Urination?: Yes Hard to postpone urination?: Yes Burning/pain with urination?: No Get up at night to urinate?: Yes Leakage of urine?: Yes Urine stream starts and stops?: Yes Trouble starting stream?: No Do you have to strain to urinate?: Yes Blood in urine?: No Urinary tract  infection?: No Sexually transmitted disease?: No Injury to kidneys or bladder?:  No Painful intercourse?: No Weak stream?: No Erection problems?: No Penile pain?: No  Gastrointestinal Nausea?: No Vomiting?: No Indigestion/heartburn?: No Diarrhea?: No Constipation?: No  Constitutional Fever: No Night sweats?: No Weight loss?: No Fatigue?: No  Skin Skin rash/lesions?: No Itching?: Yes  Eyes Blurred vision?: Yes Double vision?: Yes  Ears/Nose/Throat Sore throat?: No Sinus problems?: No  Hematologic/Lymphatic Swollen glands?: No Easy bruising?: No  Cardiovascular Leg swelling?: Yes Chest pain?: No  Respiratory Cough?: Yes Shortness of breath?: No  Endocrine Excessive thirst?: No  Musculoskeletal Back pain?: Yes Joint pain?: Yes  Neurological Headaches?: Yes Dizziness?: No  Psychologic Depression?: No Anxiety?: No  Physical Exam: BP 118/71 mmHg  Pulse 80  Ht 5\' 10"  (1.778 m)  Wt 167 lb (75.751 kg)  BMI 23.96 kg/m2  Constitutional: Well nourished. Alert and oriented, No acute distress. HEENT: [place] AT, moist mucus membranes. Trachea midline, no masses. Cardiovascular: No clubbing, cyanosis, or edema. Respiratory: Normal respiratory effort, no increased work of breathing. GI: Abdomen is soft, non tender, non distended, no abdominal masses. Liver and spleen not palpable.  No hernias appreciated.  Stool sample for occult testing is not indicated.   GU: No CVA tenderness.  No bladder fullness or masses.  Patient with circumcised phallus.   Urethral meatus is patent.  No penile discharge. No penile lesions or rashes. Scrotum without lesions, cysts, rashes and/or edema.  Testicles are located scrotally bilaterally. No masses are appreciated in the testicles. Left and right epididymis are normal. Rectal: Patient with  normal sphincter tone. Anus and perineum without scarring or rashes. No rectal masses are appreciated. Prostate is approximately 60 grams (could  not palpated entire gland due to the size of the gland), no nodules are appreciated in the areas palpated.   Seminal vesicles could not be palpated. Skin: No rashes, bruises or suspicious lesions. Lymph: No cervical or inguinal adenopathy. Neurologic: Grossly intact, no focal deficits, moving all 4 extremities. Psychiatric: Normal mood and affect.  Laboratory Data: Lab Results  Component Value Date   WBC 4.7 11/02/2015   HCT 40.0 11/02/2015   MCV 75* 11/02/2015   PLT 201 11/02/2015    Lab Results  Component Value Date   CREATININE 1.22 11/02/2015  PSA History   Lab Results  Component Value Date   PSA 4.8* 11/02/2015     Urinalysis Results for orders placed or performed in visit on 11/13/15  Microscopic Examination  Result Value Ref Range   WBC, UA 6-10 (A) 0 -  5 /hpf   RBC, UA None seen 0 -  2 /hpf   Epithelial Cells (non renal) 0-10 0 - 10 /hpf   Mucus, UA Present (A) Not Estab.   Bacteria, UA Few (A) None seen/Few  Urinalysis, Complete  Result Value Ref Range   Specific Gravity, UA 1.025 1.005 - 1.030   pH, UA 5.5 5.0 - 7.5   Color, UA Yellow Yellow   Appearance Ur Cloudy (A) Clear   Leukocytes, UA Trace (A) Negative   Protein, UA 1+ (A) Negative/Trace   Glucose, UA Negative Negative   Ketones, UA Negative Negative   RBC, UA Negative Negative   Bilirubin, UA Negative Negative   Urobilinogen, Ur 0.2 0.2 - 1.0 mg/dL   Nitrite, UA Negative Negative   Microscopic Examination See below:     Pertinent Imaging: CT abdomen and pelvis with and without IV contrast  Indication: 599.70 Hematuria, unspecified, URINARY RETENTION  Comparison: None  Technique:  CT imaging was performed of the abdomen  and pelvis without and with IV contrast. The patient received intravenous contrast (Isovue-300, 150 mL at 3 mL/sec) without incident.  A genitourinary protocol CT was performed, with noncontrast imaging from the kidneys through the bladder, nephrographic phase imaging  through the kidneys, and excretory phase imaging from the kidneys through the bladder.  Iodinated contrast was used due to the indications for the examination.  This examination was interpreted using coronal reformatted images to optimize visualization of the collecting system and ureters.  If IV contrast and coronal reformatted images had not been utilized, the likelihood of detecting an abnormality relevant to the patient's condition would have been substantially decreased. The most recent serum creatinine is 1.3 mg/dL.  Findings: There are linear nodular opacities in the right middle lobe and right lower lobe with some linear opacities in the left lower lobe suggestive of atelectasis or scarring. The visualized lower mediastinum is unremarkable.  On noncontrast images, there is no evidence of urolithiasis. The kidneys are symmetric in size. There is a Foley catheter terminating within the bladder with high density material layering throughout the bladder compatible with hemorrhage. On postcontrast images, there is symmetric renal enhancement bilaterally. There is a subcentimeter low-attenuation lesion within the mid left kidney (series 4, image 26) that is too small to characterize. No suspicious enhancing masses are identified. On delayed excretory phase images, there is symmetric excretion into the urinary collecting system. There are a couple of small filling defects within the distal right ureter (series 4, image 79 and 82). The distal most aspect of the right ureter is not well opacified, limiting its evaluation. There are no abnormal filling defects identified within the left urinary collecting system. As previously mentioned, there is a large amount of layering hemorrhage within the bladder, limiting the evaluation of the bladder walls. There is a large soft tissue mass emanating from the bladder base extending from the prostate suggestive of marked median lobe  hypertrophy.  The liver, gallbladder, pancreas, adrenal glands, and spleen are unremarkable. No enlarged abdominal or pelvic lymph nodes. The abdominal aorta is of normal caliber with scattered atherosclerotic calcification. The major aortic branch vessels appear patent.  The small bowel is normal in caliber without evidence of obstruction. There is a normal appendix. There are colonic diverticula of the descending and sigmoid colon. The colon is otherwise normal in appearance without focal wall thickening or surrounding inflammatory changes. No free intraperitoneal air or fluid.  No aggressive appearing osseous lesions are identified. Tiny sclerotic foci within the right femoral head and neck likely represent bone islands. Incidental note of bilateral synovial herniation pits of the femoral necks.  Impression: 1. Marked median prostatic lobe hypertrophy with a large amount of hemorrhage seen layering within the bladder, limiting its evaluation. Recommend direct visualization to exclude an underlying malignancy. 2. Suggestion of small filling defects of the distal right ureter which could either be related to peristalsis or additional clot; consider direct visualization at time of cystoscopy to exclude underlying malignancy.  Aleene Davidson, [place] was notified by Dr. Vinnie Langton of impression #1,2 at [place] on 08/24/2012.  The radiology resident preliminary report (sticky note) was reviewed prior to this dictation and there are no substantiative differences between the preliminary report and the final impressions in this report.  I have reviewed the images and agree with the above findings.  Electronically Reviewed by:  Jule Economy, MD Electronically Reviewed on:  08/24/2012 9:38 AM  Electronically Signed by:  Domingo Mend, MD Electronically Signed on:  08/24/2012 4:04 PM  Assessment & Plan:    1. History of gross hematuria:   Patient had a hematuria work up in 2013 that consisted of a  CT Urogram, cystoscopy with bilateral retrogrades and ultimately a TURP after experiencing clot retention.  No malignancies were discovered.  He has not had further episodes of gross hematuria.  An UA in 12/01/2013 was negative for hematuria and today's UA is negative for hematuria.  We will continue to monitor with annual microscopic UA's.  He will report any episodes of gross hematuria.  - Urinalysis, Complete  2. Erectile dysfunction:   Patient states his has been having difficulty with erections since starting his finasteride, but recently he has been taking the medication sporadically.  His wife states their sex life is "great."  I would like to address this issue again after the patient has been back on his medications consistenly.  He will RTC in 3 months for a SHIM.  3. BPH (benign prostatic hyperplasia) with LUTS:   IPSS score is 12/5.  I have given him refills on the tamsulosin and the finasteride He will RTC in 3 months for an IPSS score, PSA and PVR.  4. Elevated PSA:   His last PSA's have been over 4.00 ng/mL.  I would like to get a PSA when he returns in 3 months to start to establish a trend while he is on finasteride consistently.    5. Left renal lesion:   Patient had a too small to characterized lesion in his mid pole of the left kidney seen on CT Urogram in 2013.  We will obtain a RUS to evaluate this area.    Return in about 3 months (around 02/11/2016) for IPSS score, PVR and RUS report.  These notes generated with voice recognition software. I apologize for typographical errors.  Zara Council, [facility] [address], [place], [place] [address] [phone]

## 2015-11-13 NOTE — Telephone Encounter (Signed)
Orders done

## 2015-11-23 ENCOUNTER — Encounter: Payer: Medicaid Other | Admitting: Family Medicine

## 2015-12-06 ENCOUNTER — Ambulatory Visit (INDEPENDENT_AMBULATORY_CARE_PROVIDER_SITE_OTHER): Payer: Medicaid Other | Admitting: Family Medicine

## 2015-12-06 ENCOUNTER — Telehealth: Payer: Self-pay

## 2015-12-06 ENCOUNTER — Encounter: Payer: Self-pay | Admitting: Family Medicine

## 2015-12-06 VITALS — BP 117/76 | HR 82 | Temp 97.4°F | Ht 69.0 in | Wt 167.0 lb

## 2015-12-06 DIAGNOSIS — F17200 Nicotine dependence, unspecified, uncomplicated: Secondary | ICD-10-CM

## 2015-12-06 DIAGNOSIS — Z720 Tobacco use: Secondary | ICD-10-CM

## 2015-12-06 DIAGNOSIS — Z0000 Encounter for general adult medical examination without abnormal findings: Secondary | ICD-10-CM

## 2015-12-06 DIAGNOSIS — R079 Chest pain, unspecified: Secondary | ICD-10-CM | POA: Diagnosis not present

## 2015-12-06 DIAGNOSIS — Z1211 Encounter for screening for malignant neoplasm of colon: Secondary | ICD-10-CM | POA: Diagnosis not present

## 2015-12-06 MED ORDER — ASPIRIN EC 81 MG PO TBEC: 81.0000 mg | DELAYED_RELEASE_TABLET | Freq: Every day | ORAL | Status: DC

## 2015-12-06 NOTE — Telephone Encounter (Signed)
They need Medicaid authorization for him to now see [facility] neurology for never conduction testing for carpal tunnel.

## 2015-12-06 NOTE — Patient Instructions (Addendum)
I do encourage you to quit smoking Call [phone] to sign up for smoking cessation classes You can call 1-800-QUIT-NOW to talk with a smoking cessation coach  Start 81 mg coated baby aspirin daily now; don't wait until after your colonoscopy after all; if you develop abdominal pain or dark / bloody stools, STOP the aspirin I would like you start the aspirin now, since the risk of a heart attack is morrisomeore w that a bleed  The meloxicam 15 mg pill is for arthritis, but let's have you stop that for now too until after the colonoscopy The bupropion is for mood but will also help you quit smoking  We'll refer you to a cardiologist to have your heart checked out If you develop significant chest pain, with or without shortness of breath, nausea, neck pain or arm pain, then call 911  Try to limit saturated fats in your diet (bologna, hot dogs, barbeque, cheeseburgers, hamburgers, steak, bacon, sausage, cheese, etc.) and get more fresh fruits, vegetables, and whole grains   Health Maintenance, Male A healthy lifestyle and preventative care can promote health and wellness.  Maintain regular health, dental, and eye exams.  Eat a healthy diet. Foods like vegetables, fruits, whole grains, low-fat dairy products, and lean protein foods contain the nutrients you need and are low in calories. Decrease your intake of foods high in solid fats, added sugars, and salt. Get information about a proper diet from your health care provider, if necessary.  Regular physical exercise is one of the most important things you can do for your health. Most adults should get at least 150 minutes of moderate-intensity exercise (any activity that increases your heart rate and causes you to sweat) each week. In addition, most adults need muscle-strengthening exercises on 2 or more days a week.   Maintain a healthy weight. The body mass index (BMI) is a screening tool to identify possible weight problems. It provides an  estimate of body fat based on height and weight. Your health care provider can find your BMI and can help you achieve or maintain a healthy weight. For males 20 years and older:  A BMI below 18.5 is considered underweight.  A BMI of 18.5 to 24.9 is normal.  A BMI of 25 to 29.9 is considered overweight.  A BMI of 30 and above is considered obese.  Maintain normal blood lipids and cholesterol by exercising and minimizing your intake of saturated fat. Eat a balanced diet with plenty of fruits and vegetables. Blood tests for lipids and cholesterol should begin at age 71 and be repeated every 5 years. If your lipid or cholesterol levels are high, you are over age 9, or you are at high risk for heart disease, you may need your cholesterol levels checked more frequently.Ongoing high lipid and cholesterol levels should be treated with medicines if diet and exercise are not working.  If you smoke, find out from your health care provider how to quit. If you do not use tobacco, do not start.  Lung cancer screening is recommended for adults aged 55-80 years who are at high risk for developing lung cancer because of a history of smoking. A yearly low-dose CT scan of the lungs is recommended for people who have at least a 30-pack-year history of smoking and are current smokers or have quit within the past 15 years. A pack year of smoking is smoking an average of 1 pack of cigarettes a day for 1 year (for example, a 30-pack-year history  of smoking could mean smoking 1 pack a day for 30 years or 2 packs a day for 15 years). Yearly screening should continue until the smoker has stopped smoking for at least 15 years. Yearly screening should be stopped for people who develop a health problem that would prevent them from having lung cancer treatment.  If you choose to drink alcohol, do not have more than 2 drinks per day. One drink is considered to be 12 oz (360 mL) of beer, 5 oz (150 mL) of wine, or 1.5 oz (45 mL) of  liquor.  Avoid the use of street drugs. Do not share needles with anyone. Ask for help if you need support or instructions about stopping the use of drugs.  High blood pressure causes heart disease and increases the risk of stroke. High blood pressure is more likely to develop in:  People who have blood pressure in the end of the normal range (100-139/85-89 mm Hg).  People who are overweight or obese.  People who are African American.  If you are 52-58 years of age, have your blood pressure checked every 3-5 years. If you are 78 years of age or older, have your blood pressure checked every year. You should have your blood pressure measured twice--once when you are at a hospital or clinic, and once when you are not at a hospital or clinic. Record the average of the two measurements. To check your blood pressure when you are not at a hospital or clinic, you can use:  An automated blood pressure machine at a pharmacy.  A home blood pressure monitor.  If you are 36-49 years old, ask your health care provider if you should take aspirin to prevent heart disease.  Diabetes screening involves taking a blood sample to check your fasting blood sugar level. This should be done once every 3 years after age 33 if you are at a normal weight and without risk factors for diabetes. Testing should be considered at a younger age or be carried out more frequently if you are overweight and have at least 1 risk factor for diabetes.  Colorectal cancer can be detected and often prevented. Most routine colorectal cancer screening begins at the age of 67 and continues through age 7. However, your health care provider may recommend screening at an earlier age if you have risk factors for colon cancer. On a yearly basis, your health care provider may provide home test kits to check for hidden blood in the stool. A small camera at the end of a tube may be used to directly examine the colon (sigmoidoscopy or colonoscopy)  to detect the earliest forms of colorectal cancer. Talk to your health care provider about this at age 59 when routine screening begins. A direct exam of the colon should be repeated every 5-10 years through age 14, unless early forms of precancerous polyps or small growths are found.  People who are at an increased risk for hepatitis B should be screened for this virus. You are considered at high risk for hepatitis B if:  You were born in a country where hepatitis B occurs often. Talk with your health care provider about which countries are considered high risk.  Your parents were born in a high-risk country and you have not received a shot to protect against hepatitis B (hepatitis B vaccine).  You have HIV or AIDS.  You use needles to inject street drugs.  You live with, or have sex with, someone who has hepatitis  B.  You are a man who has sex with other men (MSM).  You get hemodialysis treatment.  You take certain medicines for conditions like cancer, organ transplantation, and autoimmune conditions.  Hepatitis C blood testing is recommended for all people born from [birth year] through 1963 and any individual with known risk factors for hepatitis C.  Healthy men should no longer receive prostate-specific antigen (PSA) blood tests as part of routine cancer screening. Talk to your health care provider about prostate cancer screening.  Testicular cancer screening is not recommended for adolescents or adult males who have no symptoms. Screening includes self-exam, a health care provider exam, and other screening tests. Consult with your health care provider about any symptoms you have or any concerns you have about testicular cancer.  Practice safe sex. Use condoms and avoid high-risk sexual practices to reduce the spread of sexually transmitted infections (STIs).  You should be screened for STIs, including gonorrhea and chlamydia if:  You are sexually active and are younger than 24  years.  You are older than 24 years, and your health care provider tells you that you are at risk for this type of infection.  Your sexual activity has changed since you were last screened, and you are at an increased risk for chlamydia or gonorrhea. Ask your health care provider if you are at risk.  If you are at risk of being infected with HIV, it is recommended that you take a prescription medicine daily to prevent HIV infection. This is called pre-exposure prophylaxis (PrEP). You are considered at risk if:  You are a man who has sex with other men (MSM).  You are a heterosexual man who is sexually active with multiple partners.  You take drugs by injection.  You are sexually active with a partner who has HIV.  Talk with your health care provider about whether you are at high risk of being infected with HIV. If you choose to begin PrEP, you should first be tested for HIV. You should then be tested every 3 months for as long as you are taking PrEP.  Use sunscreen. Apply sunscreen liberally and repeatedly throughout the day. You should seek shade when your shadow is shorter than you. Protect yourself by wearing long sleeves, pants, a wide-brimmed hat, and sunglasses year round whenever you are outdoors.  Tell your health care provider of new moles or changes in moles, especially if there is a change in shape or color. Also, tell your health care provider if a mole is larger than the size of a pencil eraser.  A one-time screening for abdominal aortic aneurysm (AAA) and surgical repair of large AAAs by ultrasound is recommended for men aged 57-75 years who are current or former smokers.  Stay current with your vaccines (immunizations).   This information is not intended to replace advice given to you by your health care provider. Make sure you discuss any questions you have with your health care provider.   Document Released: 04/17/2008 Document Revised: 11/10/2014 Document Reviewed:  03/17/2011 Elsevier Interactive Patient Education [employer].

## 2015-12-06 NOTE — Progress Notes (Signed)
Patient ID: Manuel [name], male   DOB: February 02, 1956, 60 y.o.   MRN: [id number]  Subjective:   Manuel [name] is a 60 y.o. male here for a complete physical exam  He is going to see a back specialist; arthritis in the back and the knees; he shakes at night; he did not start any medicine, wanted him to talk to me about arthritis medicine first  USPSTF grade A and B recommendations Alcohol:  no Depression:  Depression screen PHQ 2/9 12/06/2015  Decreased Interest 1  Down, Depressed, Hopeless 1  PHQ - 2 Score 2  Altered sleeping 3  Tired, decreased energy 3  Change in appetite 0  Feeling bad or failure about yourself  1  Trouble concentrating 1  Moving slowly or fidgety/restless 0  Suicidal thoughts 1  PHQ-9 Score 11  Difficult doing work/chores Somewhat difficult  upset about recent wife's dx of HPV and some issues between them; discussed mother's death in 2013/02/03; he feels much better already knowing that the HPV does not implicate that he has stepped out on his wife since their marriage, and wife is here to hear this news today   Hypertension: controlled today Obesity: n/a Tobacco use: taking Chantix, but got nauseated so quit; no hx of eating d/o or seizures HIV, hep B, hep C: check with next labs STD testing and prevention (chl/gon/syphilis): n/a Lipids: recheck March Glucose: March Colorectal cancer: colonoscopy in February soon Lung cancer: chest CT Osteoporosis: n/a AAA: n/a Aspirin: aspirin Diet: good Exercise: active Skin cancer: no worrisome moles   Past Medical History  Diagnosis Date  . Anemia   . Depression   . Sickle cell trait ([place])   . Bilateral hand numbness   . Cervical spondylosis without myelopathy   . Complete tear of rotator cuff     bilateral  . LVH (left ventricular hypertrophy)   . BPH without urinary obstruction   . COPD (chronic obstructive pulmonary disease) ([place])   . Smoker   . Poor dentition   . Bilateral carpal tunnel syndrome   .  Anxiety   . Asthma   . HLD (hyperlipidemia)   . Sleep apnea   . History of stomach ulcers   . Personal history of tobacco use, presenting hazards to health 12/21/2015   Past Surgical History  Procedure Laterality Date  . Cystourethroscopy  08/25/12    with ureteral cathization w/wo retrograde pyelogram  . Transurethral resection of prostate  08/25/12  . Rotator cuff repair Right 01/25/15  . Shoulder arthroscopy with subacromial decompression Right [id number]   Family History  Problem Relation Age of Onset  . Aneurysm Mother   . Hypertension Mother   . Diabetes Father   . Hypertension Father   . Cancer Maternal Aunt   . Cancer Maternal Uncle   . Diabetes Paternal Uncle   . COPD Neg Hx   . Stroke Neg Hx   . Heart disease Cousin   . Kidney disease Neg Hx   . Prostate cancer Father   . Prostate cancer Paternal Uncle    Social History  Substance Use Topics  . Smoking status: Current Every Day Smoker -- 1.50 packs/day for 35 years    Types: Cigarettes  . Smokeless tobacco: Never Used  . Alcohol Use: No   Review of Systems  Constitutional: Positive for unexpected weight change (some weight gain over last year, 20 pounds).  Cardiovascular: Positive for chest pain (happens with depression, stress, exertion).  Gastrointestinal: Negative for abdominal pain  and blood in stool.  Musculoskeletal: Positive for back pain and arthralgias (back and knees).  Skin:       Nicked his scalp left side, shaving this morning  Psychiatric/Behavioral: Positive for dysphoric mood.   Objective:   Filed Vitals:   12/06/15 1453  BP: 117/76  Pulse: 82  Temp: 97.4 F (36.3 C)  Height: 5\' 9"  (1.753 m)  Weight: 167 lb (75.751 kg)  SpO2: 100%   Body mass index is 24.65 kg/(m^2). Wt Readings from Last 3 Encounters:  12/21/15 160 lb (72.576 kg)  12/06/15 167 lb (75.751 kg)  11/13/15 167 lb (75.751 kg)   Physical Exam  Constitutional: He appears well-developed and well-nourished. No distress.   HENT:  Head: Normocephalic and atraumatic.  Nose: Nose normal.  Mouth/Throat: Oropharynx is clear and moist.  Eyes: EOM are normal. No scleral icterus.  Neck: No JVD present. No thyromegaly present.  Cardiovascular: Normal rate, regular rhythm and normal heart sounds.   Pulmonary/Chest: Effort normal and breath sounds normal. No respiratory distress. He has no wheezes. He has no rales.  Abdominal: Soft. Bowel sounds are normal. He exhibits no distension. There is no tenderness. There is no guarding.  Genitourinary:  Per urologist  Musculoskeletal: Normal range of motion. He exhibits no edema.  Lymphadenopathy:    He has no cervical adenopathy.  Neurological: He is alert. He displays normal reflexes. He exhibits normal muscle tone. Coordination normal.  Skin: Skin is warm and dry. No rash noted. He is not diaphoretic. No erythema. No pallor.  Blister on the instep of the left foot; nick on left side of scalp, dried blood on bandage  Psychiatric: He has a normal mood and affect. His behavior is normal. Judgment and thought content normal.     Assessment/Plan:   Problem List Items Addressed This Visit      Other   Current smoker    Chest CT (low dose) for lung cancer screening; encouraged cessation, see AVS      Relevant Orders   CT CHEST LUNG CA SCREEN LOW DOSE W/O CM   Colon cancer screening    Colonoscopy coming up in February       Other Visit Diagnoses    Preventative health care    -  Primary    Chest pain, unspecified chest pain type        EKG done today; refer to cardiology; male, middle-aged, smoker, risk factors; aspirin daily, call 911 if recurs    Relevant Orders    EKG 12-Lead (Completed)    Ambulatory referral to Cardiology       Meds ordered this encounter  Medications  . DISCONTD: meloxicam (MOBIC) 15 MG tablet    Sig: Take 15 mg by mouth daily.  Marland Kitchen aspirin EC 81 MG tablet    Sig: Take 1 tablet (81 mg total) by mouth daily.   Follow up plan: Return  in about 5 weeks (around 01/07/2016) for fasting labs and visit; 1 year for next CPE.  An after-visit summary was printed and given to the patient at [facility].  Please see the patient instructions which may contain other information and recommendations beyond what is mentioned above in the assessment and plan.  Orders Placed This Encounter  Procedures  . CT CHEST LUNG CA SCREEN LOW DOSE W/O CM  . Ambulatory referral to Cardiology  . EKG 12-Lead

## 2015-12-06 NOTE — Assessment & Plan Note (Addendum)
Chest CT (low dose) for lung cancer screening; encouraged cessation, see AVS

## 2015-12-07 ENCOUNTER — Ambulatory Visit: Payer: Medicaid Other

## 2015-12-07 ENCOUNTER — Encounter: Payer: Medicaid Other | Admitting: Sports Medicine

## 2015-12-07 DIAGNOSIS — M79673 Pain in unspecified foot: Secondary | ICD-10-CM

## 2015-12-10 ENCOUNTER — Ambulatory Visit: Payer: Disability Insurance | Admitting: Cardiovascular Disease

## 2015-12-10 ENCOUNTER — Encounter: Payer: Self-pay | Admitting: *Deleted

## 2015-12-11 NOTE — Progress Notes (Signed)
This encounter was created in error - please disregard.

## 2015-12-21 ENCOUNTER — Ambulatory Visit: Payer: Medicaid Other | Admitting: Sports Medicine

## 2015-12-21 ENCOUNTER — Inpatient Hospital Stay: Payer: Medicaid Other | Attending: Family Medicine | Admitting: Family Medicine

## 2015-12-21 ENCOUNTER — Ambulatory Visit
Admission: RE | Admit: 2015-12-21 | Discharge: 2015-12-21 | Disposition: A | Discharge status: Home / Self Care | Payer: Medicaid Other | Source: Ambulatory Visit | Attending: Family Medicine | Admitting: Family Medicine

## 2015-12-21 ENCOUNTER — Encounter: Payer: Self-pay | Admitting: Family Medicine

## 2015-12-21 DIAGNOSIS — R918 Other nonspecific abnormal finding of lung field: Secondary | ICD-10-CM | POA: Insufficient documentation

## 2015-12-21 DIAGNOSIS — Z87891 Personal history of nicotine dependence: Secondary | ICD-10-CM | POA: Diagnosis not present

## 2015-12-21 DIAGNOSIS — Z122 Encounter for screening for malignant neoplasm of respiratory organs: Secondary | ICD-10-CM

## 2015-12-21 HISTORY — DX: Personal history of nicotine dependence: Z87.891

## 2015-12-21 NOTE — Progress Notes (Signed)
In accordance with CMS guidelines, patient has meet eligibility criteria including age, absence of signs or symptoms of lung cancer, the specific calculation of cigarette smoking pack-years was 52.5 years and is a current smoker.   A shared decision-making session was conducted prior to the performance of CT scan. This includes one or more decision aids, includes benefits and harms of screening, follow-up diagnostic testing, over-diagnosis, false positive rate, and total radiation exposure.  Counseling on the importance of adherence to annual lung cancer LDCT screening, impact of co-morbidities, and ability or willingness to undergo diagnosis and treatment is imperative for compliance of the program.  Counseling on the importance of continued smoking cessation for former smokers; the importance of smoking cessation for current smokers and information about tobacco cessation interventions have been given to patient including the [place] at [facility], 1800 quit [place], as well as [place] specific smoking cessation programs.  Written order for lung cancer screening with LDCT has been given to the patient and any and all questions have been answered to the best of my abilities.   Yearly follow up will be scheduled by Burgess Estelle, Thoracic Navigator.

## 2015-12-23 NOTE — Assessment & Plan Note (Signed)
Colonoscopy coming up in February

## 2015-12-24 ENCOUNTER — Telehealth: Payer: Self-pay | Admitting: *Deleted

## 2015-12-24 NOTE — Telephone Encounter (Signed)
Notified patient of LDCT lung cancer screening results of Lung Rads 1 finding with recommendation for 12 month follow up imaging. Also notified of incidental finding noted below. Patient verbalizes understanding.   IMPRESSION: No suspicious pulmonary nodules. Lung-RADS Category 1, negative. Continue annual screening with low-dose chest CT without contrast in 12 months.  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

## 2015-12-25 ENCOUNTER — Ambulatory Visit
Admission: RE | Admit: 2015-12-25 | Discharge: 2015-12-25 | Disposition: A | Discharge status: Home / Self Care | Payer: Medicaid Other | Source: Ambulatory Visit | Attending: Gastroenterology | Admitting: Gastroenterology

## 2015-12-25 ENCOUNTER — Ambulatory Visit: Payer: Medicaid Other | Admitting: Anesthesiology

## 2015-12-25 ENCOUNTER — Encounter
Admission: RE | Disposition: A | Discharge status: Home / Self Care | Payer: Self-pay | Source: Ambulatory Visit | Attending: Gastroenterology

## 2015-12-25 ENCOUNTER — Encounter: Payer: Self-pay | Admitting: Anesthesiology

## 2015-12-25 DIAGNOSIS — D124 Benign neoplasm of descending colon: Secondary | ICD-10-CM | POA: Diagnosis not present

## 2015-12-25 DIAGNOSIS — J45909 Unspecified asthma, uncomplicated: Secondary | ICD-10-CM | POA: Insufficient documentation

## 2015-12-25 DIAGNOSIS — F329 Major depressive disorder, single episode, unspecified: Secondary | ICD-10-CM | POA: Diagnosis not present

## 2015-12-25 DIAGNOSIS — G4730 Sleep apnea, unspecified: Secondary | ICD-10-CM | POA: Insufficient documentation

## 2015-12-25 DIAGNOSIS — D120 Benign neoplasm of cecum: Secondary | ICD-10-CM | POA: Insufficient documentation

## 2015-12-25 DIAGNOSIS — K621 Rectal polyp: Secondary | ICD-10-CM | POA: Diagnosis not present

## 2015-12-25 DIAGNOSIS — Z79899 Other long term (current) drug therapy: Secondary | ICD-10-CM | POA: Diagnosis not present

## 2015-12-25 DIAGNOSIS — F17210 Nicotine dependence, cigarettes, uncomplicated: Secondary | ICD-10-CM | POA: Insufficient documentation

## 2015-12-25 DIAGNOSIS — N400 Enlarged prostate without lower urinary tract symptoms: Secondary | ICD-10-CM | POA: Diagnosis not present

## 2015-12-25 DIAGNOSIS — D573 Sickle-cell trait: Secondary | ICD-10-CM | POA: Insufficient documentation

## 2015-12-25 DIAGNOSIS — F419 Anxiety disorder, unspecified: Secondary | ICD-10-CM | POA: Insufficient documentation

## 2015-12-25 DIAGNOSIS — E785 Hyperlipidemia, unspecified: Secondary | ICD-10-CM | POA: Insufficient documentation

## 2015-12-25 DIAGNOSIS — J449 Chronic obstructive pulmonary disease, unspecified: Secondary | ICD-10-CM | POA: Insufficient documentation

## 2015-12-25 DIAGNOSIS — Z8719 Personal history of other diseases of the digestive system: Secondary | ICD-10-CM | POA: Diagnosis not present

## 2015-12-25 DIAGNOSIS — G709 Myoneural disorder, unspecified: Secondary | ICD-10-CM | POA: Insufficient documentation

## 2015-12-25 DIAGNOSIS — Z7982 Long term (current) use of aspirin: Secondary | ICD-10-CM | POA: Diagnosis not present

## 2015-12-25 DIAGNOSIS — Z1211 Encounter for screening for malignant neoplasm of colon: Secondary | ICD-10-CM | POA: Insufficient documentation

## 2015-12-25 DIAGNOSIS — Z880 Allergy status to penicillin: Secondary | ICD-10-CM | POA: Insufficient documentation

## 2015-12-25 HISTORY — PX: COLONOSCOPY WITH PROPOFOL: SHX5780

## 2015-12-25 SURGERY — COLONOSCOPY WITH PROPOFOL
Anesthesia: General

## 2015-12-25 MED ORDER — SODIUM CHLORIDE 0.9 % IV SOLN
INTRAVENOUS | Status: DC
  Administered 2015-12-25: 1000 mL via INTRAVENOUS

## 2015-12-25 MED ORDER — PROPOFOL 500 MG/50ML IV EMUL
INTRAVENOUS | Status: DC | PRN
  Administered 2015-12-25: 100 ug/kg/min via INTRAVENOUS

## 2015-12-25 MED ORDER — PHENYLEPHRINE HCL 10 MG/ML IJ SOLN
INTRAMUSCULAR | Status: DC | PRN
Start: 2015-12-25 — End: 2015-12-25
  Administered 2015-12-25 (×2): 100 ug via INTRAVENOUS

## 2015-12-25 MED ORDER — SODIUM CHLORIDE 0.9 % IV SOLN
INTRAVENOUS | Status: DC
  Administered 2015-12-25: 10:00:00 via INTRAVENOUS

## 2015-12-25 NOTE — Op Note (Signed)
[facility] Patient Name: Manuel [name] Procedure Date: 12/25/2015 10:11 AM MRN: [id number] Account #: [id number] Date of Birth: 05-02-1956 Admit Type: Outpatient Age: 60 Room: [facility] ENDO ROOM 4 Gender: Male Note Status: Finalized Procedure:            Colonoscopy Indications:          Screening for colorectal malignant neoplasm Providers:            Lucilla Lame, MD Referring MD:         Arnetha Courser (Referring MD) Medicines:            Propofol per Anesthesia Complications:        No immediate complications. Procedure:            Pre-Anesthesia Assessment:                       - Prior to the procedure, a History and Physical was                        performed, and patient medications and allergies were                        reviewed. The patient's tolerance of previous                        anesthesia was also reviewed. The risks and benefits of                        the procedure and the sedation options and risks were                        discussed with the patient. All questions were                        answered, and informed consent was obtained. Prior                        Anticoagulants: The patient has taken no previous                        anticoagulant or antiplatelet agents. ASA Grade                        Assessment: II - A patient with mild systemic disease.                        After reviewing the risks and benefits, the patient was                        deemed in satisfactory condition to undergo the                        procedure.                       After obtaining informed consent, the colonoscope was                        passed under direct vision. Throughout the procedure,  the patient's blood pressure, pulse, and oxygen                        saturations were monitored continuously. The                        Colonoscope was introduced through the anus and   advanced to the the cecum, identified by appendiceal                        orifice and ileocecal valve. The colonoscopy was                        performed without difficulty. The patient tolerated the                        procedure well. The quality of the bowel preparation                        was good. Findings:      The perianal and digital rectal examinations were normal.      A 3 mm polyp was found in the ileocecal valve. The polyp was sessile.       The polyp was removed with a cold biopsy forceps. Resection and       retrieval were complete.      A 7 mm polyp was found in the descending colon. The polyp was sessile.       The polyp was removed with a cold snare. Resection and retrieval were       complete.      A 3 mm polyp was found in the rectum. The polyp was sessile. The polyp       was removed with a cold biopsy forceps. Resection and retrieval were       complete. Impression:           - One 3 mm polyp at the ileocecal valve, removed with a                        cold biopsy forceps. Resected and retrieved.                       - One 7 mm polyp in the descending colon, removed with                        a cold snare. Resected and retrieved.                       - One 3 mm polyp in the rectum, removed with a cold                        biopsy forceps. Resected and retrieved. Recommendation:       - Await pathology results.                       - Repeat colonoscopy in 5 years if polyp adenoma and 10                        years if hyperplastic Procedure Code(s):    --- Professional ---  45385, Colonoscopy, flexible; with removal of tumor(s),                        polyp(s), or other lesion(s) by snare technique                       45380, 59, Colonoscopy, flexible; with biopsy, single                        or multiple Diagnosis Code(s):    --- Professional ---                       Z12.11, Encounter for screening for malignant neoplasm                         of colon                       D12.0, Benign neoplasm of cecum                       D12.4, Benign neoplasm of descending colon CPT copyright 2016 American Medical Association. All rights reserved. The codes documented in this report are preliminary and upon coder review may  be revised to meet current compliance requirements. Lucilla Lame, MD 12/25/2015 10:39:40 AM This report has been signed electronically. Number of Addenda: 0 Note Initiated On: 12/25/2015 10:11 AM Scope Withdrawal Time: 0 hours 12 minutes 42 seconds  Total Procedure Duration: 0 hours 14 minutes 48 seconds       [facility]

## 2015-12-25 NOTE — Anesthesia Postprocedure Evaluation (Signed)
Anesthesia Post Note  Patient: Manuel [name]  Procedure(s) Performed: Procedure(s) (LRB): COLONOSCOPY WITH PROPOFOL (N/A)  Patient location during evaluation: Endoscopy Anesthesia Type: General Level of consciousness: awake and alert Pain management: pain level controlled Vital Signs Assessment: post-procedure vital signs reviewed and stable Respiratory status: spontaneous breathing, nonlabored ventilation, respiratory function stable and patient connected to nasal cannula oxygen Cardiovascular status: blood pressure returned to baseline and stable Postop Assessment: no signs of nausea or vomiting Anesthetic complications: no    Last Vitals:  Filed Vitals:   12/25/15 1106 12/25/15 1116  BP: 116/81 130/85  Pulse: 56 54  Temp:    Resp: 18 16    Last Pain: There were no vitals filed for this visit.               Precious Haws Piscitello

## 2015-12-25 NOTE — H&P (Signed)
[facility]  [address] [place], [place] [address] Phone: [phone] Fax : [phone]  Primary Care Physician:  Enid Derry, MD Primary Gastroenterologist:  Dr. Allen Norris  Pre-Procedure History & Physical: HPI:  Manuel [name] is a 60 y.o. male is here for a screening colonoscopy.   Past Medical History  Diagnosis Date  . Anemia   . Depression   . Sickle cell trait ([place])   . Bilateral hand numbness   . Cervical spondylosis without myelopathy   . Complete tear of rotator cuff     bilateral  . LVH (left ventricular hypertrophy)   . BPH without urinary obstruction   . COPD (chronic obstructive pulmonary disease) ([place])   . Smoker   . Poor dentition   . Bilateral carpal tunnel syndrome   . Anxiety   . Asthma   . HLD (hyperlipidemia)   . Sleep apnea   . History of stomach ulcers   . Personal history of tobacco use, presenting hazards to health 12/21/2015    Past Surgical History  Procedure Laterality Date  . Cystourethroscopy  08/25/12    with ureteral cathization w/wo retrograde pyelogram  . Transurethral resection of prostate  08/25/12  . Rotator cuff repair Right 01/25/15  . Shoulder arthroscopy with subacromial decompression Right 032416    Prior to Admission medications   Medication Sig Start Date End Date Taking? Authorizing Provider  aspirin EC 81 MG tablet Take 1 tablet (81 mg total) by mouth daily. 12/06/15  Yes Arnetha Courser, MD  atorvastatin (LIPITOR) 10 MG tablet Take 1 tablet (10 mg total) by mouth at bedtime. 11/09/15  Yes Arnetha Courser, MD  buPROPion (WELLBUTRIN XL) 150 MG 24 hr tablet Take 1 tablet (150 mg total) by mouth daily. 11/01/15  Yes Arnetha Courser, MD  escitalopram (LEXAPRO) 10 MG tablet Take 1 tablet (10 mg total) by mouth daily. 11/01/15  Yes Arnetha Courser, MD  finasteride (PROSCAR) 5 MG tablet Take 1 tablet (5 mg total) by mouth daily. 11/13/15  Yes Shannon A McGowan, PA-C  folic acid (FOLVITE) 1 MG tablet Take 1 mg by  mouth daily.   Yes Historical Provider, MD  omeprazole (PRILOSEC) 20 MG capsule Take 20 mg by mouth daily.   Yes Historical Provider, MD  promethazine (PHENERGAN) 25 MG tablet Take 25 mg by mouth every 6 (six) hours as needed for nausea or vomiting. Reported on 11/13/2015   Yes Historical Provider, MD  tamsulosin (FLOMAX) 0.4 MG CAPS capsule Take 1 capsule (0.4 mg total) by mouth daily. 11/13/15  Yes Shannon A McGowan, PA-C    Allergies as of 11/13/2015 - Review Complete 11/13/2015  Allergen Reaction Noted  . Penicillin g Other (See Comments) 10/31/2015    Family History  Problem Relation Age of Onset  . Aneurysm Mother   . Hypertension Mother   . Diabetes Father   . Hypertension Father   . Cancer Maternal Aunt   . Cancer Maternal Uncle   . Diabetes Paternal Uncle   . COPD Neg Hx   . Stroke Neg Hx   . Heart disease Cousin   . Kidney disease Neg Hx   . Prostate cancer Father   . Prostate cancer Paternal Uncle     Social History   Social History  . Marital Status: Married    Spouse Name: N/A  . Number of Children: N/A  . Years of Education: N/A   Occupational History  . Not on file.   Social  History Main Topics  . Smoking status: Current Every Day Smoker -- 1.50 packs/day for 35 years    Types: Cigarettes  . Smokeless tobacco: Never Used  . Alcohol Use: No  . 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 Sexual Activity: Not on file   Other Topics Concern  . Not on file   Social History Narrative    Review of Systems: See HPI, otherwise negative ROS  Physical Exam: BP 112/88 mmHg  Pulse 80  Temp(Src) 96.9 F (36.1 C) (Tympanic)  Resp 16  Ht 5\' 9"  (1.753 m)  Wt 160 lb (72.576 kg)  BMI 23.62 kg/m2  SpO2 100% General:   Alert,  pleasant and cooperative in NAD Head:  Normocephalic and atraumatic. Neck:  Supple; no masses or thyromegaly. Lungs:  Clear throughout to auscultation.    Heart:  Regular rate and rhythm. Abdomen:  Soft, nontender and  nondistended. Normal bowel sounds, without guarding, and without rebound.   Neurologic:  Alert and  oriented x4;  grossly normal neurologically.  Impression/Plan: [name] is now here to undergo a screening colonoscopy.  Risks, benefits, and alternatives regarding colonoscopy have been reviewed with the patient.  Questions have been answered.  All parties agreeable.

## 2015-12-25 NOTE — Anesthesia Preprocedure Evaluation (Signed)
Anesthesia Evaluation  Patient identified by MRN, date of birth, ID band Patient awake    Reviewed: Allergy & Precautions, H&P , NPO status , Patient's Chart, lab work & pertinent test results  History of Anesthesia Complications Negative for: history of anesthetic complications  Airway Mallampati: III  TM Distance: >3 FB Neck ROM: limited    Dental  (+) Poor Dentition, Chipped, Missing   Pulmonary neg shortness of breath, asthma , sleep apnea , COPD, Current Smoker,    Pulmonary exam normal breath sounds clear to auscultation       Cardiovascular Exercise Tolerance: Good (-) angina(-) Past MI and (-) DOE Normal cardiovascular exam Rhythm:regular Rate:Normal     Neuro/Psych PSYCHIATRIC DISORDERS Anxiety Depression  Neuromuscular disease    GI/Hepatic negative GI ROS, Neg liver ROS,   Endo/Other  negative endocrine ROS  Renal/GU Renal disease  negative genitourinary   Musculoskeletal  (+) Arthritis ,   Abdominal   Peds  Hematology negative hematology ROS (+)   Anesthesia Other Findings Past Medical History:   Anemia                                                       Depression                                                   Sickle cell trait (H[facility])                                      Bilateral hand numbness                                      Cervical spondylosis without myelopathy                      Complete tear of rotator cuff                                  Comment:bilateral   LVH (left ventricular hypertrophy)                           BPH without urinary obstruction                              COPD (chronic obstructive pulmonary disease) (*              Smoker                                                       Poor dentition  Bilateral carpal tunnel syndrome                             Anxiety                                                     Asthma                                                       HLD (hyperlipidemia)                                         Sleep apnea                                                  History of stomach ulcers                                    Personal history of tobacco use, presenting ha* 12/21/2015   Past Surgical History:   CYSTOURETHROSCOPY                                08/25/12       Comment:with ureteral cathization w/wo retrograde               pyelogram   TRANSURETHRAL RESECTION OF PROSTATE              08/25/12     ROTATOR CUFF REPAIR                             Right 01/25/15      SHOULDER ARTHROSCOPY WITH SUBACROMIAL DECOMPRE* Right 032416      BMI    Body Mass Index   23.61 kg/m 2      Reproductive/Obstetrics negative OB ROS                             Anesthesia Physical Anesthesia Plan  ASA: III  Anesthesia Plan: General   Post-op Pain Management:    Induction:   Airway Management Planned:   Additional Equipment:   Intra-op Plan:   Post-operative Plan:   Informed Consent: I have reviewed the patients History and Physical, chart, labs and discussed the procedure including the risks, benefits and alternatives for the proposed anesthesia with the patient or authorized representative who has indicated his/her understanding and acceptance.   Dental Advisory Given  Plan Discussed with: Anesthesiologist, CRNA and Surgeon  Anesthesia Plan Comments:         Anesthesia Quick Evaluation

## 2015-12-25 NOTE — Transfer of Care (Signed)
Immediate Anesthesia Transfer of Care Note  Patient: Manuel [name]  Procedure(s) Performed: Procedure(s): COLONOSCOPY WITH PROPOFOL (N/A)  Patient Location: PACU  Anesthesia Type:General  Level of Consciousness: awake, alert  and oriented  Airway & Oxygen Therapy: Patient Spontanous Breathing and Patient connected to nasal cannula oxygen  Post-op Assessment: Report given to RN and Post -op Vital signs reviewed and stable  Post vital signs: Reviewed and stable  Last Vitals:  Filed Vitals:   12/25/15 0923 12/25/15 1046  BP: 112/88 94/51  Pulse: 80 51  Temp: 36.1 C 36.1 C  Resp: 16 14    Complications: No apparent anesthesia complications

## 2015-12-26 ENCOUNTER — Encounter: Payer: Self-pay | Admitting: Gastroenterology

## 2015-12-26 LAB — SURGICAL PATHOLOGY

## 2015-12-27 ENCOUNTER — Encounter: Payer: Self-pay | Admitting: Gastroenterology

## 2016-01-07 ENCOUNTER — Ambulatory Visit: Payer: Medicaid Other | Admitting: Family Medicine

## 2016-01-10 ENCOUNTER — Encounter: Payer: Self-pay | Admitting: Family Medicine

## 2016-01-10 ENCOUNTER — Ambulatory Visit (INDEPENDENT_AMBULATORY_CARE_PROVIDER_SITE_OTHER): Payer: Medicaid Other | Admitting: Family Medicine

## 2016-01-10 VITALS — BP 124/77 | HR 75 | Temp 97.2°F | Ht 69.0 in | Wt 167.0 lb

## 2016-01-10 DIAGNOSIS — R5383 Other fatigue: Secondary | ICD-10-CM | POA: Diagnosis not present

## 2016-01-10 DIAGNOSIS — H538 Other visual disturbances: Secondary | ICD-10-CM

## 2016-01-10 DIAGNOSIS — F329 Major depressive disorder, single episode, unspecified: Secondary | ICD-10-CM | POA: Diagnosis not present

## 2016-01-10 DIAGNOSIS — M25512 Pain in left shoulder: Secondary | ICD-10-CM

## 2016-01-10 DIAGNOSIS — F32A Depression, unspecified: Secondary | ICD-10-CM

## 2016-01-10 DIAGNOSIS — I517 Cardiomegaly: Secondary | ICD-10-CM

## 2016-01-10 MED ORDER — ESCITALOPRAM OXALATE 10 MG PO TABS: 15.0000 mg | ORAL_TABLET | Freq: Every day | ORAL | Status: DC

## 2016-01-10 NOTE — Progress Notes (Signed)
BP 124/77 mmHg  Pulse 75  Temp(Src) 97.2 F (36.2 C)  Ht 5\' 9"  (1.753 m)  Wt 167 lb (75.751 kg)  BMI 24.65 kg/m2  SpO2 99%   Subjective:    Patient ID: Manuel [name], male    DOB: [birth year], 58 y.o.   MRN: [id number]  HPI: Kruse Shoupe is a 60 y.o. male  Chief Complaint  Patient presents with  . Follow-up    He states he's just here to follow up   He has pain in his left shoulder; suspects torn rotator cuff, wear and tear over the years; used to pressure wash houses, carried ladders  Blurred vision; so tired all the time; would like to see eye doctor  He saw the dentist and they are going to send him to a dentist on [place]; going to pull teeth  He had the colonoscopy; polypectomy negative for cancer  Yesterday he went to [facility] for his hand; he does have some issues, not carpal tunnel; saw orthopaedist already  Goes to foot doctor soonabout the blisters and problems with his right knee; anxious to get to the foot doctor  Relevant past medical, surgical, family and social history reviewed and updated as indicated. Interim medical history since our last visit reviewed. Allergies and medications reviewed and updated.  Review of Systems  Per HPI unless specifically indicated above     Objective:    BP 124/77 mmHg  Pulse 75  Temp(Src) 97.2 F (36.2 C)  Ht 5\' 9"  (1.753 m)  Wt 167 lb (75.751 kg)  BMI 24.65 kg/m2  SpO2 99%  Wt Readings from Last 3 Encounters:  01/10/16 167 lb (75.751 kg)  12/25/15 160 lb (72.576 kg)  12/21/15 160 lb (72.576 kg)    Physical Exam  Constitutional: He appears well-developed and well-nourished. No distress.  Eyes: EOM are normal. No scleral icterus.  Cardiovascular: Normal rate and regular rhythm.   Pulmonary/Chest: Effort normal and breath sounds normal.  Abdominal: He exhibits no distension.  Musculoskeletal:       Left shoulder: He exhibits decreased range of motion and tenderness.  Neurological: He is alert.   Psychiatric: His speech is normal and behavior is normal. His mood appears anxious. His affect is not blunt and not labile. Cognition and memory are not impaired. He does not express impulsivity or inappropriate judgment. He exhibits a depressed mood (mildly).  Good eye contact with examiner      Assessment & Plan:   Problem List Items Addressed This Visit      Cardiovascular and Mediastinum   Left ventricular hypertrophy    And chest pain; referred in February to cardiologist; having staff check on status of referral        Other   Clinical depression    Increase SSRI with close f/u; call with any problems in the meantime      Relevant Medications   escitalopram (LEXAPRO) 10 MG tablet   Pain in shoulder    Suspect rotator cuff injury; will have staff check on status of ortho referral       Other Visit Diagnoses    Other fatigue    -  Primary    Relevant Orders    T3, free (Completed)    T4, free (Completed)    TSH (Completed)    VITAMIN D 25 Hydroxy (Vit-D Deficiency, Fractures) (Completed)    CBC with Differential/Platelet (Completed)    Vitamin B12 (Completed)    Basic metabolic panel (Completed)  Blurred vision        Relevant Orders    Ambulatory referral to Optometry       Follow up plan: Return in about 4 weeks (around 02/07/2016) for medication follow-up.  Meds ordered this encounter  Medications  . meloxicam (MOBIC) 15 MG tablet    Sig: Take 15 mg by mouth daily.    Refill:  0  . escitalopram (LEXAPRO) 10 MG tablet    Sig: Take 1.5 tablets (15 mg total) by mouth daily.    Dispense:  45 tablet    Refill:  2   Face-to-face time with patient was more than 25 minutes, >50% time spent counseling and coordination of care  An after-visit summary was printed and given to the patient at [facility].  Please see the patient instructions which may contain other information and recommendations beyond what is mentioned above in the assessment and plan.

## 2016-01-10 NOTE — Patient Instructions (Addendum)
[facility] front staff -- please help patient work with referral coordinator or office manager today to follow-up on appointments for his rotator cuff (orthopaedist) and chest pain (cardiology) ---------------------------------------------  Let's increase your escitalopram (Lexapro) to 15 mg a day (that will be one and one-half pills each day) Return in 4 weeks I'll refer you to the eye doctor We'll see what your labs show If you have not heard anything from my staff in a week about any orders/referrals/studies from today, please contact us here to follow-up [phone]

## 2016-01-11 LAB — T4, FREE: FREE T4: 1.2 ng/dL (ref 0.82–1.77)

## 2016-01-11 LAB — CBC WITH DIFFERENTIAL/PLATELET
Basophils Absolute: 0 10*3/uL (ref 0.0–0.2)
Basos: 0 %
EOS (ABSOLUTE): 0.2 10*3/uL (ref 0.0–0.4)
EOS: 3 %
HEMATOCRIT: 38.7 % (ref 37.5–51.0)
HEMOGLOBIN: 13.1 g/dL (ref 12.6–17.7)
Immature Grans (Abs): 0 10*3/uL (ref 0.0–0.1)
Immature Granulocytes: 0 %
LYMPHS ABS: 2.5 10*3/uL (ref 0.7–3.1)
Lymphs: 38 %
MCH: 24.9 pg — AB (ref 26.6–33.0)
MCHC: 33.9 g/dL (ref 31.5–35.7)
MCV: 74 fL — AB (ref 79–97)
MONOS ABS: 0.5 10*3/uL (ref 0.1–0.9)
Monocytes: 7 %
NEUTROS ABS: 3.5 10*3/uL (ref 1.4–7.0)
Neutrophils: 52 %
Platelets: 230 10*3/uL (ref 150–379)
RBC: 5.26 x10E6/uL (ref 4.14–5.80)
RDW: 15.1 % (ref 12.3–15.4)
WBC: 6.8 10*3/uL (ref 3.4–10.8)

## 2016-01-11 LAB — BASIC METABOLIC PANEL
BUN/Creatinine Ratio: 11 (ref 9–20)
BUN: 15 mg/dL (ref 6–24)
CALCIUM: 9 mg/dL (ref 8.7–10.2)
CO2: 26 mmol/L (ref 18–29)
Chloride: 101 mmol/L (ref 96–106)
Creatinine, Ser: 1.33 mg/dL — ABNORMAL HIGH (ref 0.76–1.27)
GFR calc Af Amer: 68 mL/min/{1.73_m2} (ref >59)
GFR, EST NON AFRICAN AMERICAN: 59 mL/min/{1.73_m2} — AB (ref >59)
GLUCOSE: 79 mg/dL (ref 65–99)
Potassium: 3.8 mmol/L (ref 3.5–5.2)
Sodium: 142 mmol/L (ref 134–144)

## 2016-01-11 LAB — T3, FREE: T3 FREE: 2.7 pg/mL (ref 2.0–4.4)

## 2016-01-11 LAB — VITAMIN D 25 HYDROXY (VIT D DEFICIENCY, FRACTURES): Vit D, 25-Hydroxy: 12.7 ng/mL — ABNORMAL LOW (ref 30.0–100.0)

## 2016-01-11 LAB — TSH: TSH: 0.842 u[IU]/mL (ref 0.450–4.500)

## 2016-01-11 LAB — VITAMIN B12: Vitamin B-12: 265 pg/mL (ref 211–946)

## 2016-01-14 ENCOUNTER — Telehealth: Payer: Self-pay | Admitting: Family Medicine

## 2016-01-14 DIAGNOSIS — E559 Vitamin D deficiency, unspecified: Secondary | ICD-10-CM

## 2016-01-14 DIAGNOSIS — E538 Deficiency of other specified B group vitamins: Secondary | ICD-10-CM | POA: Insufficient documentation

## 2016-01-14 DIAGNOSIS — N182 Chronic kidney disease, stage 2 (mild): Secondary | ICD-10-CM | POA: Insufficient documentation

## 2016-01-14 MED ORDER — VITAMIN D (ERGOCALCIFEROL) 1.25 MG (50000 UNIT) PO CAPS: 50000.0000 [IU] | ORAL_CAPSULE | ORAL | Status: DC

## 2016-01-14 NOTE — Telephone Encounter (Signed)
Patient notified

## 2016-01-14 NOTE — Telephone Encounter (Signed)
Please let pt know that his vit D level is low; start prescription vitamin D once a week for 8 weeks, then vitamin D3 OTC 1,000 iu daily His B12 is low too; start 500 or 1000 mcg of vitamin B12 daily; both the vit B12 and vit D will help give him energy His kidney function has declined just a little since the last time it was checked; stay hydrated, drink 64 ounces of water a day, and avoid NSAIDs Recheck all these labs in 3 months

## 2016-01-14 NOTE — Assessment & Plan Note (Signed)
Check labs mid-June

## 2016-01-18 ENCOUNTER — Ambulatory Visit (INDEPENDENT_AMBULATORY_CARE_PROVIDER_SITE_OTHER): Payer: Medicaid Other | Admitting: Sports Medicine

## 2016-01-18 ENCOUNTER — Encounter: Payer: Self-pay | Admitting: Sports Medicine

## 2016-01-18 ENCOUNTER — Ambulatory Visit (INDEPENDENT_AMBULATORY_CARE_PROVIDER_SITE_OTHER): Payer: Medicaid Other

## 2016-01-18 DIAGNOSIS — M216X9 Other acquired deformities of unspecified foot: Secondary | ICD-10-CM

## 2016-01-18 DIAGNOSIS — M2142 Flat foot [pes planus] (acquired), left foot: Secondary | ICD-10-CM | POA: Diagnosis not present

## 2016-01-18 DIAGNOSIS — M79673 Pain in unspecified foot: Secondary | ICD-10-CM

## 2016-01-18 DIAGNOSIS — M2040 Other hammer toe(s) (acquired), unspecified foot: Secondary | ICD-10-CM

## 2016-01-18 DIAGNOSIS — M21619 Bunion of unspecified foot: Secondary | ICD-10-CM

## 2016-01-18 DIAGNOSIS — M2141 Flat foot [pes planus] (acquired), right foot: Secondary | ICD-10-CM | POA: Diagnosis not present

## 2016-01-18 DIAGNOSIS — G5750 Tarsal tunnel syndrome, unspecified lower limb: Secondary | ICD-10-CM | POA: Diagnosis not present

## 2016-01-18 DIAGNOSIS — M25579 Pain in unspecified ankle and joints of unspecified foot: Secondary | ICD-10-CM

## 2016-01-18 MED ORDER — TRIAMCINOLONE ACETONIDE 10 MG/ML IJ SUSP: 10.0000 mg | Freq: Once | INTRAMUSCULAR | Status: DC

## 2016-01-18 NOTE — Progress Notes (Signed)
Patient ID: Isamar Brumfield, male   DOB: [birth year], 58 y.o.   MRN: [id number] Subjective: Javed Juhasz is a 60 y.o. male patient who presents to office for evaluation of Left>Right foot pain. Patient complains of progressive pain especially over the last year in both feet that starts as fatigue in the medial arch, pain over the bunion, hammertoes that even travels up to sides of ankle. 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 Also admits that his legs and feet feel restless at end of the day. Admits to skin changes from his feet rubbing in his shoes. Patient denies any other pedal complaints.   Current smoker 0.5ppd   Patient Active Problem List   Diagnosis Date Noted  . Vitamin D deficiency 01/14/2016  . Chronic kidney disease (CKD), stage II (mild) 01/14/2016  . Vitamin B12 deficiency 01/14/2016  . Benign neoplasm of cecum   . Benign neoplasm of descending colon   . Special screening for malignant neoplasms, colon   . Personal history of tobacco use, presenting hazards to health 12/21/2015  . Renal lesion 11/13/2015  . History of hematuria 11/13/2015  . Chronic lower back pain 11/09/2015  . BPH (benign prostatic hyperplasia) 11/01/2015  . Elevated PSA 11/01/2015  . Hyperlipidemia 11/01/2015  . Medication monitoring encounter 11/01/2015  . Colon cancer screening 11/01/2015  . Bunion, left foot 11/01/2015  . Sickle cell trait ([place]) 01/16/2015  . Cervical spondylosis without myelopathy 01/09/2015  . Carpal tunnel syndrome 01/09/2015  . Hand numbness 01/09/2015  . Pain in shoulder 11/28/2014  . Complete rotator cuff rupture of left shoulder 09/18/2014  . Dental disease 09/18/2014  . Absolute anemia 08/19/2014  . Left ventricular hypertrophy 08/18/2014  . Clinical depression 10/03/2013  . Current smoker 10/03/2013  . Carpal tunnel  syndrome of right wrist 10/03/2013  . Benign prostatic hypertrophy without urinary obstruction 11/30/2012   Current Outpatient Prescriptions on File Prior to Visit  Medication Sig Dispense Refill  . aspirin EC 81 MG tablet Take 1 tablet (81 mg total) by mouth daily.    Marland Kitchen atorvastatin (LIPITOR) 10 MG tablet Take 1 tablet (10 mg total) by mouth at bedtime. 30 tablet 4  . buPROPion (WELLBUTRIN XL) 150 MG 24 hr tablet Take 1 tablet (150 mg total) by mouth daily. 30 tablet 2  . escitalopram (LEXAPRO) 10 MG tablet Take 1.5 tablets (15 mg total) by mouth daily. 45 tablet 2  . finasteride (PROSCAR) 5 MG tablet Take 1 tablet (5 mg total) by mouth daily. 90 tablet 3  . folic acid (FOLVITE) 1 MG tablet Take 1 mg by mouth daily.    . meloxicam (MOBIC) 15 MG tablet Take 15 mg by mouth daily.  0  . omeprazole (PRILOSEC) 20 MG capsule Take 20 mg by mouth daily.    . promethazine (PHENERGAN) 25 MG tablet Take 25 mg by mouth every 6 (six) hours as needed for nausea or vomiting. Reported on 11/13/2015    . tamsulosin (FLOMAX) 0.4 MG CAPS capsule Take 1 capsule (0.4 mg total) by mouth daily. 90 capsule 3  . Vitamin D, Ergocalciferol, (DRISDOL) 50000 units CAPS capsule Take 1 capsule (50,000 Units total) by mouth every 7 (seven) days. To build up your vitamin D level 4 capsule 1   No current facility-administered medications on file prior to visit.  Allergies  Allergen Reactions  . Penicillin G Other (See Comments)    Objective:  General: Alert and oriented x3 in no acute distress  Dermatology: No open lesions bilateral lower extremities, no webspace macerations, no ecchymosis bilateral, all nails x 10 are short and thickened but well manicured.  Vascular: Dorsalis Pedis and Posterior Tibial pedal pulses 1/4, Capillary Fill Time 3 seconds, (+) scant pedal hair growth bilateral, no edema bilateral lower extremities, Temperature gradient within normal limits.  Neurology: Gross sensation intact via light  touch bilateral. (-) Tinels sign bilateral.  Musculoskeletal: Mild tenderness with palpation to sinus tarsi bilateral, no tenderness with palpation along medial arch, medial fascial band bilateral, no tenderness along Posterior tibial tendon course with medial soft tissue buldge noted, Significant tracking bunion Left>right with hammertoe 2-5 with no pain to palpation or on range, there is mild decreased ankle rom with knee extending  vs flexed resembling gastroc equnius bilateral, Subtalar joint range of motion is within normal limits, there is no 1st ray hypermobility noted bilateral, there is medial arch collapse Left>Right on weightbearing exam,moderate RF valgus Left>Right, + "too-many toes" sign appreciated, unable to perform heel rise test due to pain at sinus tarsi bilateral.    Xrays Right/Left foot:  Normal osseous mineralization. Diffuse joint space narrowing suggestive of arthritis at midtarsal, STJ, posterior ankle. No fracture/dislocation/boney destruction. No 1st ray elevatus present. Increased Talar head uncovering present. Anterior break in cyma line with midtarsal breach present. Increased Talar declination present. Decreased calcaneal inclination present consistent with Pes Planus. Significant bunion and hammer toe deformity. No soft tissue abnormalities or radiopaque foreign bodies.   Assessment and Plan: Problem List Items Addressed This Visit    None    Visit Diagnoses    Foot pain, unspecified laterality    -  Primary    Relevant Orders    DG Foot 2 Views Left    DG Foot 2 Views Right    Sinus tarsi syndrome, unspecified laterality        Relevant Medications    triamcinolone acetonide (KENALOG) 10 MG/ML injection 10 mg    Bunion        Hammer toe, unspecified laterality        Pes planus of both feet           -Complete examination performed -Xrays reviewed -Discussed treatement options; discussed bunion, hammertoe and significant pes planus deformity contributing  to Sinus tarsi pain and general foot pain;conservative and Surgical; No surgical intervention will be considered until patient stops smoking -After oral consent and aseptic prep, injected a mixture containing 1 ml of 2%  plain lidocaine, 1 ml 0.5% plain marcaine, 0.5 ml of kenalog 10 and 0.5 ml of dexamethasone phosphate into sinus tarsi bilateral. Post-injection care discussed with patient.  -Recommend good supportive shoes daily and OTC inserts; If no relief will Rx orthotics from [facility] with Mobic of which he already has -Recommend Epsom salt soaks followed by icing as needed  -Patient to return to office in 6 weeks/ as needed or sooner if condition worsens.  Landis Martins, DPM

## 2016-01-22 NOTE — Assessment & Plan Note (Signed)
Increase SSRI with close f/u; call with any problems in the meantime

## 2016-01-22 NOTE — Assessment & Plan Note (Signed)
And chest pain; referred in February to cardiologist; having staff check on status of referral

## 2016-01-22 NOTE — Assessment & Plan Note (Signed)
Suspect rotator cuff injury; will have staff check on status of ortho referral

## 2016-01-31 ENCOUNTER — Encounter: Payer: Self-pay | Admitting: Family Medicine

## 2016-01-31 NOTE — Progress Notes (Signed)
RE: chest CT  Received: 3 days ago    Lieutenant Diego, RN  Arnetha Courser, MD           Got it, thanks       Previous Messages     ----- Message -----   From: Arnetha Courser, MD   Sent: 01/22/2016 11:32 AM    To: Lieutenant Diego, RN  Subject: chest CT                     Low dose chest CT for screening

## 2016-02-04 ENCOUNTER — Telehealth: Payer: Self-pay | Admitting: Family Medicine

## 2016-02-04 ENCOUNTER — Telehealth: Payer: Self-pay

## 2016-02-04 ENCOUNTER — Other Ambulatory Visit: Payer: Medicaid Other

## 2016-02-04 DIAGNOSIS — R972 Elevated prostate specific antigen [PSA]: Secondary | ICD-10-CM

## 2016-02-04 NOTE — Telephone Encounter (Signed)
Tried calling patient to tell him of his [facility] appointment. No answer. Left message for patient to return my phone call.   Appointment is 03-05-2016 at 2:15 at [facility].

## 2016-02-04 NOTE — Telephone Encounter (Signed)
errenous °

## 2016-02-05 ENCOUNTER — Ambulatory Visit: Payer: Medicaid Other

## 2016-02-05 LAB — PSA: PROSTATE SPECIFIC AG, SERUM: 6.1 ng/mL — AB (ref 0.0–4.0)

## 2016-02-06 NOTE — Telephone Encounter (Signed)
Still unable to get in touch with patient. Will send letter regarding appointment with Dr. Nolen Mu information or if the patient wants to stay here to contact our office.

## 2016-02-07 ENCOUNTER — Ambulatory Visit: Payer: Medicaid Other | Admitting: Urology

## 2016-02-07 ENCOUNTER — Encounter: Payer: Self-pay | Admitting: Urology

## 2016-02-11 ENCOUNTER — Ambulatory Visit: Payer: Medicaid Other | Admitting: Urology

## 2016-02-14 ENCOUNTER — Ambulatory Visit
Admission: RE | Admit: 2016-02-14 | Discharge: 2016-02-14 | Disposition: A | Discharge status: Home / Self Care | Payer: Medicaid Other | Source: Ambulatory Visit | Attending: Urology | Admitting: Urology

## 2016-02-14 DIAGNOSIS — N289 Disorder of kidney and ureter, unspecified: Secondary | ICD-10-CM

## 2016-02-14 DIAGNOSIS — Z09 Encounter for follow-up examination after completed treatment for conditions other than malignant neoplasm: Secondary | ICD-10-CM | POA: Diagnosis not present

## 2016-02-14 DIAGNOSIS — Z87448 Personal history of other diseases of urinary system: Secondary | ICD-10-CM | POA: Diagnosis not present

## 2016-02-14 DIAGNOSIS — N400 Enlarged prostate without lower urinary tract symptoms: Secondary | ICD-10-CM | POA: Diagnosis not present

## 2016-02-14 IMAGING — US US RENAL
1 series · 14 of 25 positions shown · non-contrast
Comparison: None.

CLINICAL DATA: Patient with history of left renal cyst.

EXAM:
RENAL / URINARY TRACT ULTRASOUND COMPLETE

[Series 1: us renal · 0.26mm/px · 14 of 31 slices shown]
[im 1/31]
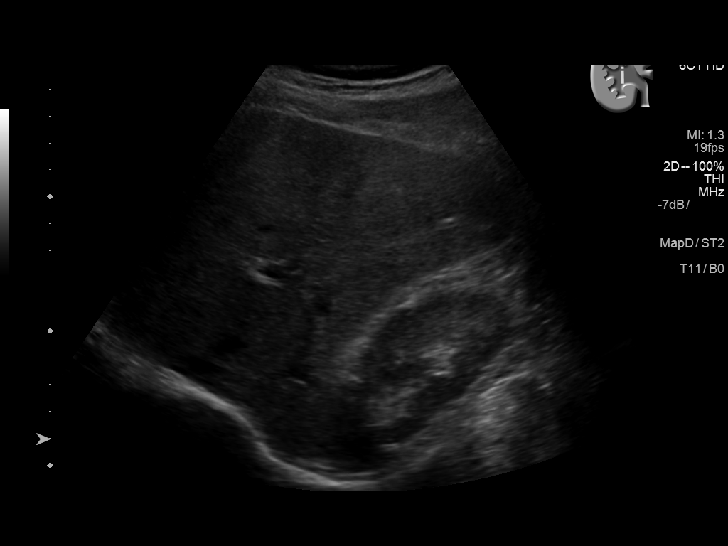
[im 3/31]
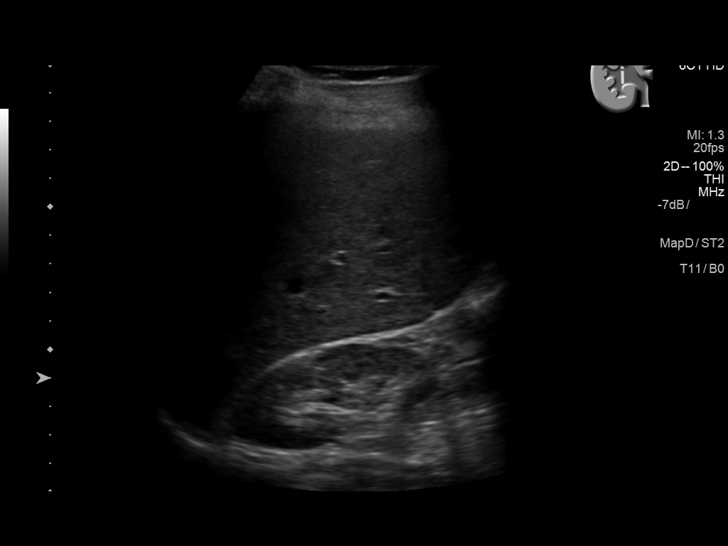
[im 6/31]
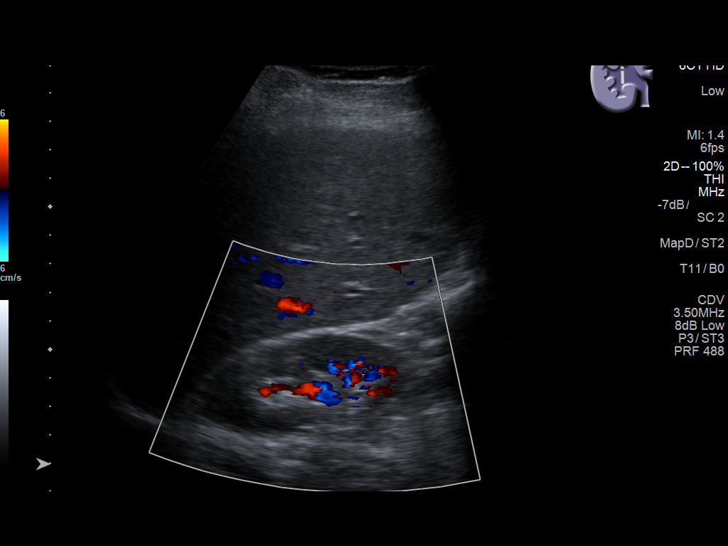
[im 8/31]
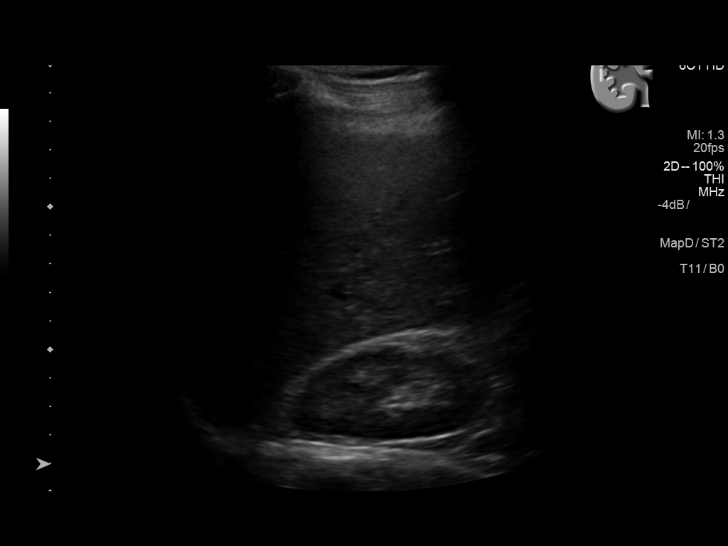
[im 11/31]
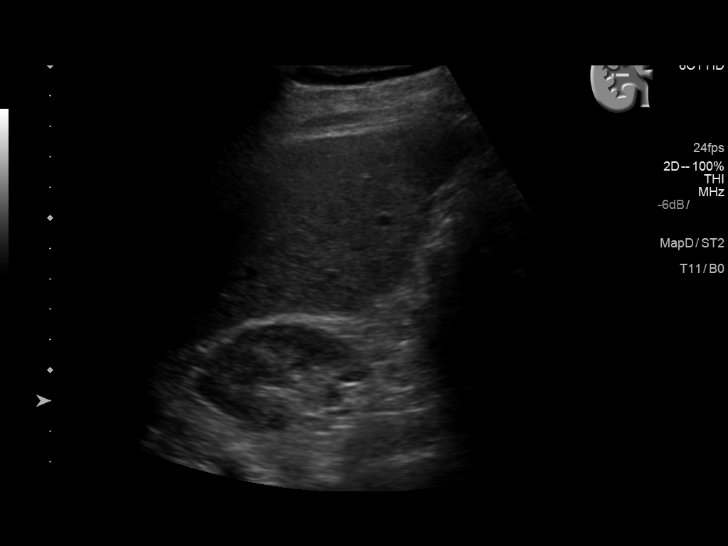
[im 12/31]
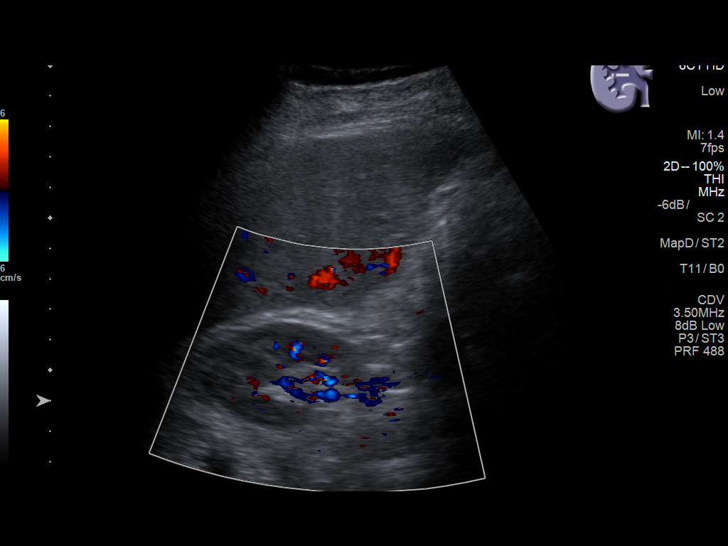
[im 14/31]
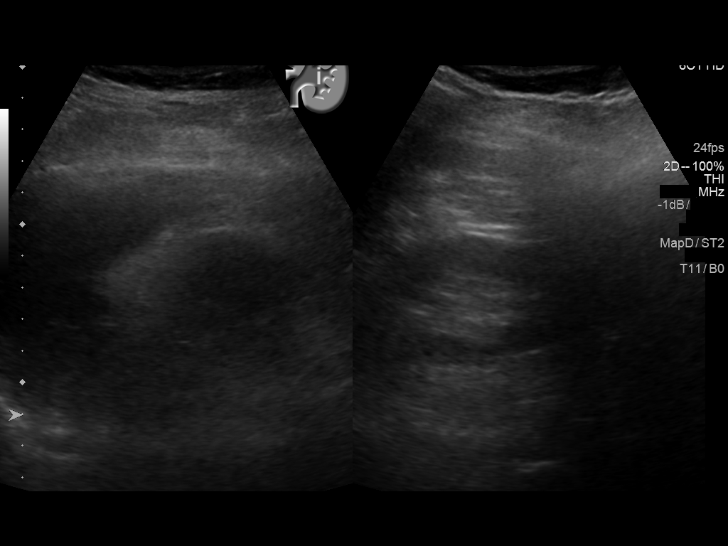
[im 17/31]
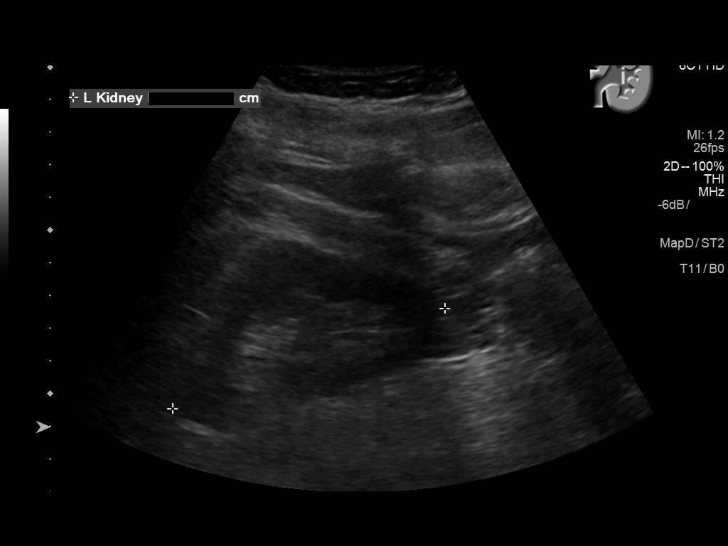
[im 19/31]
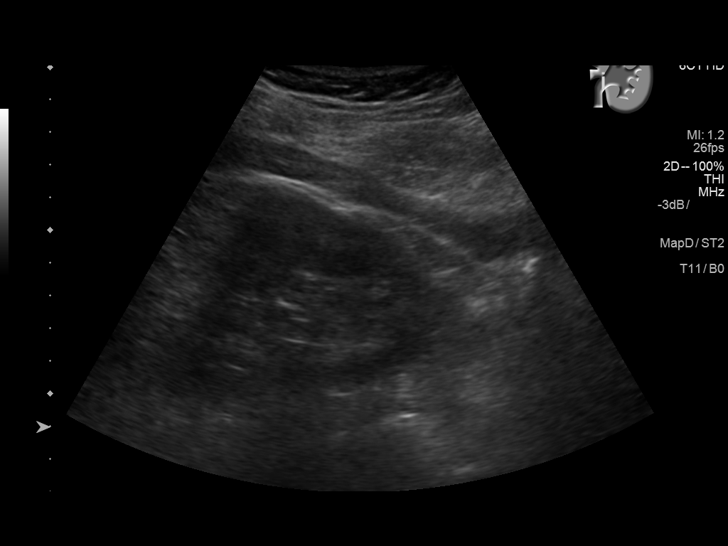
[im 21/31]
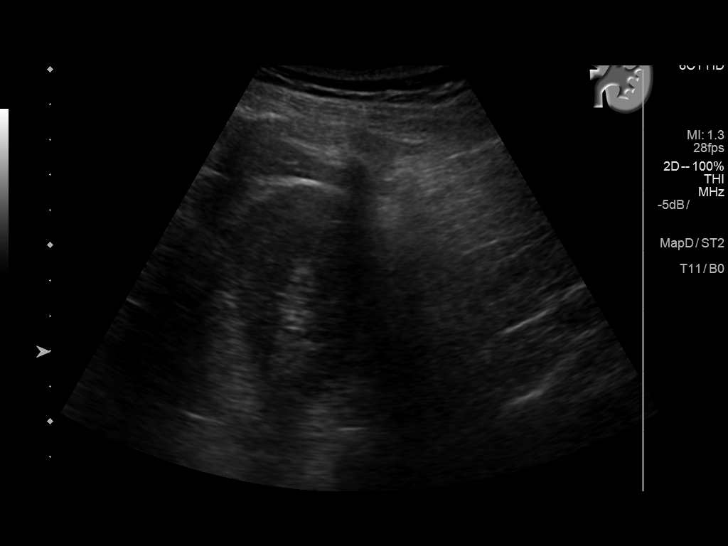
[im 23/31]
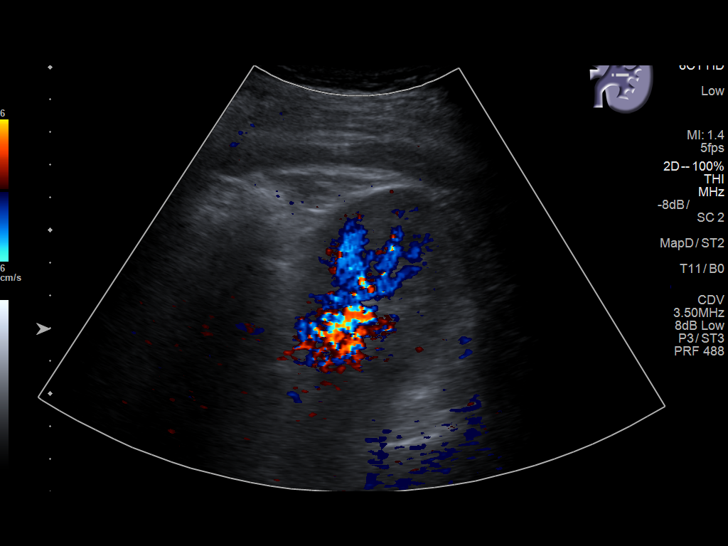
[im 26/31]
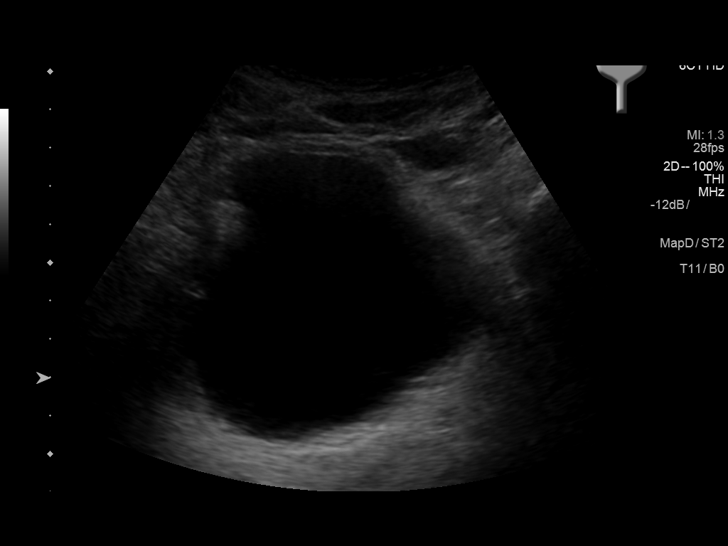
[im 28/31]
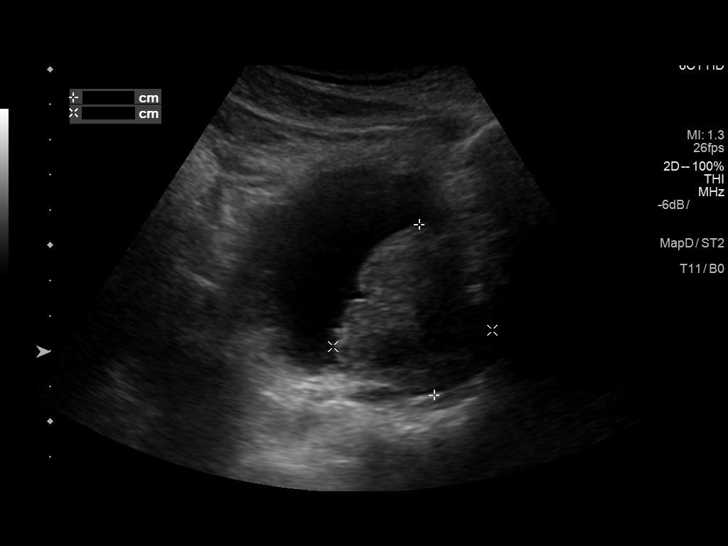
[im 31/31]
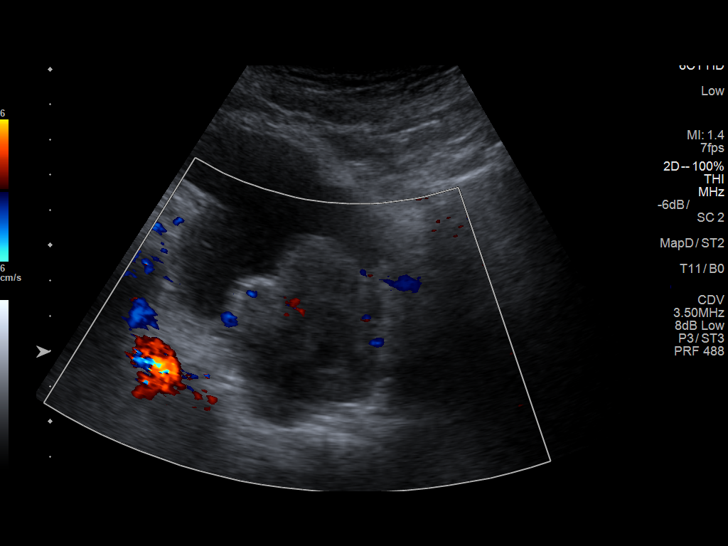

[14 of 25 positions shown; findings below may reference images not displayed]

FINDINGS: Right Kidney:

Length: 7.8 cm. Echogenicity within normal limits. No mass or
hydronephrosis visualized.

Left Kidney:

Length: 8.9 cm. Echogenicity within normal limits. No mass or
hydronephrosis visualized.

Bladder:

Prostate is markedly enlarged.
IMPRESSION: Enlarged prostate.

No hydronephrosis.

## 2016-02-19 ENCOUNTER — Encounter: Payer: Self-pay | Admitting: Urology

## 2016-02-19 ENCOUNTER — Ambulatory Visit (INDEPENDENT_AMBULATORY_CARE_PROVIDER_SITE_OTHER): Payer: Medicaid Other | Admitting: Family Medicine

## 2016-02-19 ENCOUNTER — Encounter: Payer: Self-pay | Admitting: Family Medicine

## 2016-02-19 ENCOUNTER — Ambulatory Visit (INDEPENDENT_AMBULATORY_CARE_PROVIDER_SITE_OTHER): Payer: Medicaid Other | Admitting: Urology

## 2016-02-19 VITALS — BP 130/66 | HR 60 | Temp 98.1°F | Resp 16 | Ht 69.0 in | Wt 171.5 lb

## 2016-02-19 VITALS — BP 154/75 | HR 66 | Ht 68.0 in | Wt 171.7 lb

## 2016-02-19 DIAGNOSIS — E538 Deficiency of other specified B group vitamins: Secondary | ICD-10-CM

## 2016-02-19 DIAGNOSIS — N182 Chronic kidney disease, stage 2 (mild): Secondary | ICD-10-CM

## 2016-02-19 DIAGNOSIS — M75122 Complete rotator cuff tear or rupture of left shoulder, not specified as traumatic: Secondary | ICD-10-CM | POA: Diagnosis not present

## 2016-02-19 DIAGNOSIS — D573 Sickle-cell trait: Secondary | ICD-10-CM

## 2016-02-19 DIAGNOSIS — F32A Depression, unspecified: Secondary | ICD-10-CM

## 2016-02-19 DIAGNOSIS — E559 Vitamin D deficiency, unspecified: Secondary | ICD-10-CM

## 2016-02-19 DIAGNOSIS — R972 Elevated prostate specific antigen [PSA]: Secondary | ICD-10-CM | POA: Diagnosis not present

## 2016-02-19 DIAGNOSIS — M25512 Pain in left shoulder: Secondary | ICD-10-CM

## 2016-02-19 DIAGNOSIS — N401 Enlarged prostate with lower urinary tract symptoms: Secondary | ICD-10-CM

## 2016-02-19 DIAGNOSIS — Z87448 Personal history of other diseases of urinary system: Secondary | ICD-10-CM

## 2016-02-19 DIAGNOSIS — N400 Enlarged prostate without lower urinary tract symptoms: Secondary | ICD-10-CM

## 2016-02-19 DIAGNOSIS — N289 Disorder of kidney and ureter, unspecified: Secondary | ICD-10-CM | POA: Diagnosis not present

## 2016-02-19 DIAGNOSIS — E785 Hyperlipidemia, unspecified: Secondary | ICD-10-CM

## 2016-02-19 DIAGNOSIS — N138 Other obstructive and reflux uropathy: Secondary | ICD-10-CM

## 2016-02-19 DIAGNOSIS — F329 Major depressive disorder, single episode, unspecified: Secondary | ICD-10-CM

## 2016-02-19 LAB — BLADDER SCAN AMB NON-IMAGING: SCAN RESULT: 22

## 2016-02-19 NOTE — Progress Notes (Signed)
BP 130/66 mmHg  Pulse 60  Temp(Src) 98.1 F (36.7 C) (Oral)  Resp 16  Ht 5\' 9"  (1.753 m)  Wt 171 lb 8 oz (77.792 kg)  BMI 25.31 kg/m2  SpO2 99%   Subjective:    Patient ID: Manuel [name], male    DOB: Mar 16, 1956, 61 y.o.   MRN: [id number]  HPI: Manuel [name] is a 60 y.o. male  Chief Complaint  Patient presents with  . Medication Refill  . Hyperlipidemia  . Depression  . Benign Prostatic Hypertrophy   Patient is here for f/u; he is known to me from my previous practice He has not heard about shoulder appointment; left shoulder pain, ongoing for months He sees urologist today about his prostate; urinating okay; it's enlarged He had the EMG for his right hand; he didn't see a whole lot to worry about right now and no need for surgery; does have arthritis  He had his teeth pulled and goes back soon for partials; no fevers  Trying to do a good job with his diet; on the cholesterol pill now, tolerating well  Now taking vitamins too  Mood is pretty much the same; having some issues with some wife; wife has not yet started seeing psychiatrist; there is stress from her memory loss and mood disorder; he continues to be supportive, takes her to appointments  Depression screen [facility] 2/9 02/19/2016 12/06/2015  Decreased Interest 0 1  Down, Depressed, Hopeless 1 1  PHQ - 2 Score 1 2  Altered sleeping - 3  Tired, decreased energy - 3  Change in appetite - 0  Feeling bad or failure about yourself  - 1  Trouble concentrating - 1  Moving slowly or fidgety/restless - 0  Suicidal thoughts - 1  PHQ-9 Score - 11  Difficult doing work/chores - Somewhat difficult   Relevant past medical, surgical, family and social history reviewed Interim medical history since our last visit reviewed. Allergies and medications reviewed  Review of Systems Per HPI unless specifically indicated above     Objective:    BP 130/66 mmHg  Pulse 60  Temp(Src) 98.1 F (36.7 C) (Oral)  Resp 16  Ht 5'  9" (1.753 m)  Wt 171 lb 8 oz (77.792 kg)  BMI 25.31 kg/m2  SpO2 99%  Wt Readings from Last 3 Encounters:  02/22/16 169 lb 4 oz (76.771 kg)  02/19/16 171 lb 11.2 oz (77.883 kg)  02/19/16 171 lb 8 oz (77.792 kg)    Physical Exam  Constitutional: He appears well-developed and well-nourished. No distress.  HENT:  Head: Normocephalic and atraumatic.  Eyes: EOM are normal. No scleral icterus.  Neck: No thyromegaly present.  Cardiovascular: Normal rate and regular rhythm.   Pulmonary/Chest: Effort normal and breath sounds normal.  Abdominal: Soft. Bowel sounds are normal. He exhibits no distension.  Musculoskeletal: He exhibits no edema.       Left shoulder: He exhibits decreased range of motion, tenderness and pain. He exhibits no swelling and no deformity.  Neurological: Coordination normal.  Skin: Skin is warm and dry. No pallor.  Psychiatric: He has a normal mood and affect. His behavior is normal. Judgment and thought content normal. His mood appears not anxious. His affect is not blunt, not labile and not inappropriate. He is not slowed and not withdrawn. Cognition and memory are not impaired. He does not express impulsivity or inappropriate judgment. He expresses no homicidal and no suicidal ideation.  Very cooperative, good eye contact with examiner;  attentive to wife; calm   Results for orders placed or performed in visit on 02/04/16  PSA  Result Value Ref Range   Prostate Specific Ag, Serum 6.1 (H) 0.0 - 4.0 ng/mL   Vitamin D level January 10, 2016 was 12.7 Vitamin B12 level was 265 Creatinine was 1.33, GFR 68 TSH was 0.842     Assessment & Plan:   Problem List Items Addressed This Visit      Digestive   Vitamin B12 deficiency    Supplement with OTC        Musculoskeletal and Integument   Complete rotator cuff rupture of left shoulder    Patient needs to see ortho; staff to check on status of referral entered earlier in December 2016        Genitourinary   Chronic  kidney disease (CKD), stage II (mild)    Limit NSAIDs, hydration        Other   Clinical depression    Encouraged counseling with home stress and caretaker issues; supportive listening provided      Elevated PSA    Following with urologist      Hyperlipidemia    Continue statin; will check lipids and sgpt with next labs; healthy eating, limit saturated fats      Pain in shoulder - Primary    Referral was put in while I was working at my last practice; will ask staff to help him see where that referral stands; he has rotator tuff tear and needs to see ortho      Sickle cell trait ([place])    With mild microcytosis      Vitamin D deficiency    Prescription vitamin D once a week for 8 weeks, then OTC 1000 iu daily         Follow up plan: Return in about 3 months (around 05/20/2016) for routine follow-up.  An after-visit summary was printed and given to the patient at [facility] [facility].  Please see the patient instructions which may contain other information and recommendations beyond what is mentioned above in the assessment and plan.  Meds ordered this encounter  Medications  . HYDROcodone-acetaminophen (NORCO/VICODIN) 5-325 MG tablet    Sig: Take 1 tablet by mouth as needed.    Refill:  0

## 2016-02-19 NOTE — Progress Notes (Addendum)
5:08 PM   Manuel [name] 11-Feb-1956 [id number]  Referring provider: Arnetha Courser, MD [address] [place], [place] [address]  Chief Complaint  Patient presents with  . Benign Prostatic Hypertrophy    3 month follow up  . Elevated PSA    HPI: Patient is a 60 year old African-American male with a history of gross hematuria, erectile dysfunction and BPH with LUTS who presents today to discuss his renal ultrasound report, repeat an I PSS score, PVR and PSA.  History of gross hematuria Patient had an episode of clot retention in 2013.  He underwent a CT Urogram, cystoscopy with bilateral retrogrades and ultimately a TURP with Dr. Darcus Austin at [facility].  No malignancies were discovered.  Hematuria was from his enlarged, friable prostate.  He has not had any gross hematuria since that time.  A too small to characterize lesion was seen in the left midpole of the kidney on the CT Urogram in 2013.  Renal ultrasound completed on 02/14/2016 noted no renal masses, no hydronephrosis and an enlarged prostate.  I did review the films with the patient and his wife.  His UA today was unremarkable.  Erectile dysfunction His SHIM score is 20, which is mild erectile dysfunction.   He has been having difficulty with erections since starting the finasteride.   His major complaint is achieving a firm enough erections.  His libido is preserved.   His risk factors for ED are hyperlipidemia, depression, age, BPH and smoking.  He denies any painful erections or curvatures with his erections.   His wife states their sex life is "great."        SHIM      02/19/16 1653       SHIM: Over the last 6 months:   How do you rate your confidence that you could get and keep an erection? Moderate     When you had erections with sexual stimulation, how often were your erections hard enough for penetration (entering your partner)? Sometimes (about half the time)     During sexual intercourse, how often were  you able to maintain your erection after you had penetrated (entered) your partner? Slightly Difficult     During sexual intercourse, how difficult was it to maintain your erection to completion of intercourse? Not Difficult     When you attempted sexual intercourse, how often was it satisfactory for you? Not Difficult     SHIM Total Score   SHIM 20        Score: 1-7 Severe ED 8-11 Moderate ED 12-16 Mild-Moderate ED 17-21 Mild ED 22-25 No ED   BPH WITH LUTS His IPSS score today is 15, which is moderate lower urinary tract symptomatology. He is mostly dissatisfied with his quality life due to his urinary symptoms.  His PVR was 22 mL. His previous I PSS score was 12/5.  His major complaint today a weak urinary stream.  He has had these symptoms for over three years.  He doesn't do that he does not drink a lot of fluids during the day.   He has been taking his tamsulosin and finasteride daily since it was refilled his last visit 3 months ago.  He denies any dysuria, hematuria or suprapubic pain.  His has had a TURP.  He also denies any recent fevers, chills, nausea or vomiting.   He has a family history of PCa, with father and paternal father with prostate cancer.  IPSS      02/19/16 1600       International Prostate Symptom Score   How often have you had the sensation of not emptying your bladder? About half the time     How often have you had to urinate less than every two hours? Not at All     How often have you found you stopped and started again several times when you urinated? Less than half the time     How often have you found it difficult to postpone urination? More than half the time     How often have you had a weak urinary stream? Almost always     How often have you had to strain to start urination? Not at All     How many times did you typically get up at night to urinate? 1 Time     Total IPSS Score 15     Quality of Life due to urinary symptoms   If you were to  spend the rest of your life with your urinary condition just the way it is now how would you feel about that? Mostly Disatisfied        Score:  1-7 Mild 8-19 Moderate 20-35 Severe   PMH: Past Medical History  Diagnosis Date  . Anemia   . Depression   . Sickle cell trait ([place])   . Bilateral hand numbness   . Cervical spondylosis without myelopathy   . Complete tear of rotator cuff     bilateral  . LVH (left ventricular hypertrophy)   . BPH without urinary obstruction   . COPD (chronic obstructive pulmonary disease) ([place])   . Smoker   . Poor dentition   . Bilateral carpal tunnel syndrome   . Anxiety   . Asthma   . HLD (hyperlipidemia)   . Sleep apnea   . History of stomach ulcers   . Personal history of tobacco use, presenting hazards to health 12/21/2015    Surgical History: Past Surgical History  Procedure Laterality Date  . Cystourethroscopy  08/25/12    with ureteral cathization w/wo retrograde pyelogram  . Transurethral resection of prostate  08/25/12  . Rotator cuff repair Right 01/25/15  . Shoulder arthroscopy with subacromial decompression Right [id number]  . Colonoscopy with propofol N/A 12/25/2015    Procedure: COLONOSCOPY WITH PROPOFOL;  Surgeon: Lucilla Lame, MD;  Location: [facility] ENDOSCOPY;  Service: Endoscopy;  Laterality: N/A;    Home Medications:    Medication List       This list is accurate as of: 02/19/16  5:08 PM.  Always use your most recent med list.               aspirin EC 81 MG tablet  Take 1 tablet (81 mg total) by mouth daily.     atorvastatin 10 MG tablet  Commonly known as:  LIPITOR  Take 1 tablet (10 mg total) by mouth at bedtime.     buPROPion 150 MG 24 hr tablet  Commonly known as:  WELLBUTRIN XL  Take 1 tablet (150 mg total) by mouth daily.     escitalopram 10 MG tablet  Commonly known as:  LEXAPRO  Take 1.5 tablets (15 mg total) by mouth daily.     finasteride 5 MG tablet  Commonly known as:  PROSCAR  Take 1 tablet (5 mg  total) by mouth daily.     folic acid 1 MG tablet  Commonly known as:  FOLVITE  Take 1 mg  by mouth daily.     HYDROcodone-acetaminophen 5-325 MG tablet  Commonly known as:  NORCO/VICODIN  Take 1 tablet by mouth as needed.     omeprazole 20 MG capsule  Commonly known as:  PRILOSEC  Take 20 mg by mouth daily. Reported on 02/19/2016     promethazine 25 MG tablet  Commonly known as:  PHENERGAN  Take 25 mg by mouth every 6 (six) hours as needed for nausea or vomiting. Reported on 02/19/2016     tamsulosin 0.4 MG Caps capsule  Commonly known as:  FLOMAX  Take 1 capsule (0.4 mg total) by mouth daily.     Vitamin D (Ergocalciferol) 50000 units Caps capsule  Commonly known as:  DRISDOL  Take 1 capsule (50,000 Units total) by mouth every 7 (seven) days. To build up your vitamin D level        Allergies:  Allergies  Allergen Reactions  . Penicillin G Other (See Comments)    Family History: Family History  Problem Relation Age of Onset  . Aneurysm Mother   . Hypertension Mother   . Diabetes Father   . Hypertension Father   . Cancer Maternal Aunt   . Cancer Maternal Uncle   . Diabetes Paternal Uncle   . COPD Neg Hx   . Stroke Neg Hx   . Heart disease Cousin   . Kidney disease Neg Hx   . Prostate cancer Father   . Prostate cancer Paternal Uncle     Social History:  reports that he has been smoking Cigarettes.  He has a 17.5 pack-year smoking history. He has never used smokeless tobacco. He reports that he does not drink alcohol or use illicit drugs.  ROS: UROLOGY Frequent Urination?: No Hard to postpone urination?: No Burning/pain with urination?: No Get up at night to urinate?: Yes Leakage of urine?: No Urine stream starts and stops?: No Trouble starting stream?: No Do you have to strain to urinate?: No Blood in urine?: No Urinary tract infection?: No Sexually transmitted disease?: No Injury to kidneys or bladder?: No Painful intercourse?: No Weak stream?:  Yes Erection problems?: No Penile pain?: No  Gastrointestinal Nausea?: No Vomiting?: No Indigestion/heartburn?: No Diarrhea?: No Constipation?: No  Constitutional Fever: No Night sweats?: No Weight loss?: No Fatigue?: No  Skin Skin rash/lesions?: No Itching?: No  Eyes Blurred vision?: Yes Double vision?: No  Ears/Nose/Throat Sore throat?: No Sinus problems?: No  Hematologic/Lymphatic Swollen glands?: No Easy bruising?: No  Cardiovascular Leg swelling?: No Chest pain?: No  Respiratory Cough?: Yes Shortness of breath?: No  Endocrine Excessive thirst?: No  Musculoskeletal Back pain?: Yes Joint pain?: No  Neurological Headaches?: Yes Dizziness?: No  Psychologic Depression?: Yes Anxiety?: No  Physical Exam: BP 154/75 mmHg  Pulse 66  Ht 5\' 8"  (1.727 m)  Wt 171 lb 11.2 oz (77.883 kg)  BMI 26.11 kg/m2  Constitutional: Well nourished. Alert and oriented, No acute distress. HEENT: [place] AT, moist mucus membranes. Trachea midline, no masses. Cardiovascular: No clubbing, cyanosis, or edema. Respiratory: Normal respiratory effort, no increased work of breathing. GI: Abdomen is soft, non tender, non distended, no abdominal masses. Liver and spleen not palpable.  No hernias appreciated.  Stool sample for occult testing is not indicated.   GU: No CVA tenderness.  No bladder fullness or masses.  Patient with circumcised phallus.   Urethral meatus is patent.  No penile discharge. No penile lesions or rashes. Scrotum without lesions, cysts, rashes and/or edema.  Testicles are located scrotally bilaterally. No  masses are appreciated in the testicles. Left and right epididymis are normal. Rectal: Patient with  normal sphincter tone. Anus and perineum without scarring or rashes. No rectal masses are appreciated. Prostate is approximately 60 grams (could not palpated entire gland due to the size of the gland), no nodules are appreciated in the areas palpated.   Seminal  vesicles could not be palpated. Skin: No rashes, bruises or suspicious lesions. Lymph: No cervical or inguinal adenopathy. Neurologic: Grossly intact, no focal deficits, moving all 4 extremities. Psychiatric: Normal mood and affect.  Laboratory Data: Lab Results  Component Value Date   WBC 6.8 01/10/2016   HCT 38.7 01/10/2016   MCV 74* 01/10/2016   PLT 230 01/10/2016    Lab Results  Component Value Date   CREATININE 1.33* 01/10/2016  PSA History  4.43 ng/mL on 12/01/2013  4.67 ng/mL on 08/28/2014  4.80 ng/mL on 11/02/2015  6.10 ng/mL on 02/04/2016   Urinalysis Results for orders placed or performed in visit on 02/19/16  Microscopic Examination  Result Value Ref Range   WBC, UA 0-5 0 -  5 /hpf   RBC, UA 0-2 0 -  2 /hpf   Epithelial Cells (non renal) 0-10 0 - 10 /hpf   Renal Epithel, UA 0-10 (A) None seen /hpf   Bacteria, UA None seen None seen/Few  Urinalysis, Complete  Result Value Ref Range   Specific Gravity, UA 1.015 1.005 - 1.030   pH, UA 5.5 5.0 - 7.5   Color, UA Yellow Yellow   Appearance Ur Clear Clear   Leukocytes, UA Negative Negative   Protein, UA Negative Negative/Trace   Glucose, UA Negative Negative   Ketones, UA Negative Negative   RBC, UA Negative Negative   Bilirubin, UA Negative Negative   Urobilinogen, Ur 0.2 0.2 - 1.0 mg/dL   Nitrite, UA Negative Negative   Microscopic Examination See below:     Pertinent Imaging: CLINICAL DATA: Patient with history of left renal cyst.  EXAM: RENAL / URINARY TRACT ULTRASOUND COMPLETE  COMPARISON: None.  FINDINGS: Right Kidney:  Length: 7.8 cm. Echogenicity within normal limits. No mass or hydronephrosis visualized.  Left Kidney:  Length: 8.9 cm. Echogenicity within normal limits. No mass or hydronephrosis visualized.  Bladder:  Prostate is markedly enlarged.  IMPRESSION: Enlarged prostate.  No hydronephrosis.   Electronically Signed  By: Lovey Newcomer M.D.  On:  02/14/2016 15:09  Results for [facility], [place] (MRN [id number]) as of 02/24/2016 07:10  Ref. Range 02/19/2016 16:52  Scan Result Unknown 22   Assessment & Plan:    1. History of gross hematuria:   Patient had a hematuria work up in 2013 that consisted of a CT Urogram, cystoscopy with bilateral retrogrades and ultimately a TURP after experiencing clot retention.  No malignancies were discovered.  He has not had further episodes of gross hematuria.  Today's UA is negative for hematuria.  Renal ultrasound completed on 02/14/2016 did not note any renal masses, urinary calculi or hydronephrosis.  We will continue to monitor with annual microscopic UA's.  He will report any episodes of gross hematuria.    - Urinalysis, Complete  2. Erectile dysfunction:   SHIM score is 20.  Patient states his has been having difficulty with erections since starting his finasteride.  His wife states their sex life is "great."   He will RTC in 12 months for a SHIM score and exam.  3. BPH (benign prostatic hyperplasia) with LUTS:   IPSS score is 15/4.  He will  try to increase his fluid intake to see if he can improve his weak urinary stream. He will continue the finasteride and tamsulosin.  His recent PSA has returned elevated at 6.1. I am repeating it today to rule out laboratory error. If the PSA continues to rise despite the patient taking finasteride, he may need to undergo a prostate biopsy in the future.  4. Elevated PSA:   Patient's PSA has actually returned higher than it was 3 months ago, 4.8 to 6.1, this is with the patient taking finasteride. The PSA should have declined. I repeated a PSA today to rule out laboratory error. If the PSA returns elevated, we will pursue a prostate biopsy.  5. Left renal lesion:   No lesions seen on recent ultrasound. We will continue to monitor UA's yearly.  Return for follow up pending PSA results.  These notes generated with voice recognition software. I apologize for  typographical errors.  Zara Council, [facility] [address] [place], [place] [address] [phone]

## 2016-02-19 NOTE — Patient Instructions (Addendum)
Please take the prescription vitamin D every week for 8 weeks today, then the over-the-counter Please work with Roselyn Reef about your shoulder doctor appointment I will recommend couples counseling, and you can call to schedule a visit that fits your schedule

## 2016-02-20 LAB — URINALYSIS, COMPLETE
BILIRUBIN UA: NEGATIVE
GLUCOSE, UA: NEGATIVE
Ketones, UA: NEGATIVE
Leukocytes, UA: NEGATIVE
NITRITE UA: NEGATIVE
PH UA: 5.5 (ref 5.0–7.5)
Protein, UA: NEGATIVE
RBC UA: NEGATIVE
Specific Gravity, UA: 1.015 (ref 1.005–1.030)
UUROB: 0.2 mg/dL (ref 0.2–1.0)

## 2016-02-20 LAB — MICROSCOPIC EXAMINATION: BACTERIA UA: NONE SEEN

## 2016-02-20 LAB — PSA: PROSTATE SPECIFIC AG, SERUM: 5.1 ng/mL — AB (ref 0.0–4.0)

## 2016-02-22 ENCOUNTER — Ambulatory Visit (INDEPENDENT_AMBULATORY_CARE_PROVIDER_SITE_OTHER): Payer: Medicaid Other | Admitting: Cardiovascular Disease

## 2016-02-22 ENCOUNTER — Encounter: Payer: Self-pay | Admitting: Cardiovascular Disease

## 2016-02-22 VITALS — BP 132/84 | HR 72 | Ht 70.0 in | Wt 169.2 lb

## 2016-02-22 DIAGNOSIS — R011 Cardiac murmur, unspecified: Secondary | ICD-10-CM

## 2016-02-22 DIAGNOSIS — R079 Chest pain, unspecified: Secondary | ICD-10-CM | POA: Diagnosis not present

## 2016-02-22 NOTE — Progress Notes (Signed)
Cardiology Office Note   Date:  02/22/2016   ID:  Manuel [name], [place] 10-24-1956, MRN [id number]  PCP:  Enid Derry, MD  Cardiologist:   Kathlyn Sacramento, MD   Chief Complaint  Patient presents with  . other    Ref by Dr. Sanda Klein for history of heart murmur & chest pain. Pt. c/o chest pain, shortness of breath and fatigue. Meds reviewed by the patient verbally.       History of Present Illness: Manuel [name] is a 60 y.o. male who was referred by Dr. Sanda Klein for evaluation of chest pain and a heart murmur.  He has no previous cardiac history but was told about a heart murmur before his prostate surgery. He has known history of hyperlipidemia, tobacco use and family history of premature coronary artery disease. He used to be much more active in the past working out at [employer]. However, he stopped doing that over the last few years. He has experienced intermittent episodes of left sided chest pain described as strain and aching which can happen at rest or with physical activities especially with lifting and less with walking. The pain usually lasts for about 30 minutes and has no other associations. He has mild exertional dyspnea. He continues to smoke half a pack per day but used to smoke more in the past. No previous cardiac testing.   Past Medical History  Diagnosis Date  . Anemia   . Depression   . Sickle cell trait ([place])   . Bilateral hand numbness   . Cervical spondylosis without myelopathy   . Complete tear of rotator cuff     bilateral  . LVH (left ventricular hypertrophy)   . BPH without urinary obstruction   . COPD (chronic obstructive pulmonary disease) ([place])   . Smoker   . Poor dentition   . Bilateral carpal tunnel syndrome   . Anxiety   . Asthma   . HLD (hyperlipidemia)   . Sleep apnea   . History of stomach ulcers   . Personal history of tobacco use, presenting hazards to health 12/21/2015    Past Surgical History  Procedure Laterality Date  .  Cystourethroscopy  08/25/12    with ureteral cathization w/wo retrograde pyelogram  . Transurethral resection of prostate  08/25/12  . Rotator cuff repair Right 01/25/15  . Shoulder arthroscopy with subacromial decompression Right [id number]  . Colonoscopy with propofol N/A 12/25/2015    Procedure: COLONOSCOPY WITH PROPOFOL;  Surgeon: Lucilla Lame, MD;  Location: [facility] ENDOSCOPY;  Service: Endoscopy;  Laterality: N/A;     Current Outpatient Prescriptions  Medication Sig Dispense Refill  . aspirin EC 81 MG tablet Take 1 tablet (81 mg total) by mouth daily.    Marland Kitchen atorvastatin (LIPITOR) 10 MG tablet Take 1 tablet (10 mg total) by mouth at bedtime. 30 tablet 4  . buPROPion (WELLBUTRIN XL) 150 MG 24 hr tablet Take 1 tablet (150 mg total) by mouth daily. 30 tablet 2  . escitalopram (LEXAPRO) 10 MG tablet Take 1.5 tablets (15 mg total) by mouth daily. 45 tablet 2  . finasteride (PROSCAR) 5 MG tablet Take 1 tablet (5 mg total) by mouth daily. 90 tablet 3  . folic acid (FOLVITE) 1 MG tablet Take 1 mg by mouth daily.    Marland Kitchen HYDROcodone-acetaminophen (NORCO/VICODIN) 5-325 MG tablet Take 1 tablet by mouth as needed.  0  . omeprazole (PRILOSEC) 20 MG capsule Take 20 mg by mouth daily. Reported on 02/19/2016    .  promethazine (PHENERGAN) 25 MG tablet Take 25 mg by mouth every 6 (six) hours as needed for nausea or vomiting. Reported on 02/19/2016    . tamsulosin (FLOMAX) 0.4 MG CAPS capsule Take 1 capsule (0.4 mg total) by mouth daily. 90 capsule 3  . Vitamin D, Ergocalciferol, (DRISDOL) 50000 units CAPS capsule Take 1 capsule (50,000 Units total) by mouth every 7 (seven) days. To build up your vitamin D level 4 capsule 1   No current facility-administered medications for this visit.    Allergies:   Penicillin g    Social History:  The patient  reports that he has been smoking Cigarettes.  He has a 17.5 pack-year smoking history. He has never used smokeless tobacco. He reports that he does not drink alcohol or use  illicit drugs.   Family History:  The patient's family history includes Aneurysm in his mother; Cancer in his maternal aunt and maternal uncle; Diabetes in his father and paternal uncle; Heart disease in his cousin; Hypertension in his father and mother; Prostate cancer in his father and paternal uncle. There is no history of COPD, Stroke, or Kidney disease.    ROS:  Please see the history of present illness.   Otherwise, review of systems are positive for none.   All other systems are reviewed and negative.    PHYSICAL EXAM: VS:  BP 132/84 mmHg  Pulse 72  Ht 5\' 10"  (1.778 m)  Wt 169 lb 4 oz (76.771 kg)  BMI 24.28 kg/m2 , BMI Body mass index is 24.28 kg/(m^2). GEN: Well nourished, well developed, in no acute distress HEENT: normal Neck: no JVD, carotid bruits, or masses Cardiac: RRR; no murmurs, rubs, or gallops,no edema  Respiratory:  clear to auscultation bilaterally, normal work of breathing GI: soft, nontender, nondistended, + BS MS: no deformity or atrophy Skin: warm and dry, no rash Neuro:  Strength and sensation are intact Psych: euthymic mood, full affect   EKG:  EKG is ordered today. The ekg ordered today demonstrates normal sinus rhythm with no significant ST or T wave changes.   Recent Labs: 11/02/2015: ALT 14 01/10/2016: BUN 15; Creatinine, Ser 1.33*; Platelets 230; Potassium 3.8; Sodium 142; TSH 0.842    Lipid Panel    Component Value Date/Time   CHOL 208* 11/02/2015 1303   TRIG 247* 11/02/2015 1303   HDL 47 11/02/2015 1303   LDLCALC 112* 11/02/2015 1303      Wt Readings from Last 3 Encounters:  02/22/16 169 lb 4 oz (76.771 kg)  02/19/16 171 lb 11.2 oz (77.883 kg)  02/19/16 171 lb 8 oz (77.792 kg)       ASSESSMENT AND PLAN:  1.  Chest pain: The chest pain is overall atypical with some anginal and some atypical features. It is not always consistent with exertion and also it happens frequently at rest. He has multiple risk factors for coronary artery  disease. Based on ECG is normal. I recommend evaluation with a stress echocardiogram. I do not hear cardiac murmurs by physical exam.  2. Hyperlipidemia: Continue treatment with atorvastatin.  3. Tobacco use: We provided him with smoking cessation instructions.    Disposition:   FU with me as needed.   Signed,  Kathlyn Sacramento, MD  02/22/2016 2:22 PM    [facility]

## 2016-02-22 NOTE — Patient Instructions (Addendum)
Medication Instructions:  Your physician recommends that you continue on your current medications as directed. Please refer to the Current Medication list given to you today.   Labwork: none  Testing/Procedures: Your physician has requested that you have a stress echocardiogram. For further information please visit [email or web address]. Please follow instruction sheet as given.  Monday, May 1, 4pm No smoking 4 hours prior    Follow-Up: Your physician recommends that you schedule a follow-up appointment as needed with Dr. Fletcher Anon.   Any Other Special Instructions Will Be Listed Below (If Applicable).     If you need a refill on your cardiac medications before your next appointment, please call your pharmacy.  Exercise Stress Echocardiogram An exercise stress echocardiogram is a heart (cardiac) test used to check the function of your heart. This test may also be called an exercise stress echocardiography or stress echo. This stress test will check how well your heart muscle and valves are working and determine if your heart muscle is getting enough blood. You will exercise on a treadmill to naturally increase or stress the functioning of your heart.  An echocardiogram uses sound waves (ultrasound) to produce an image of your heart. If your heart does not work normally, it may indicate coronary artery disease with poor coronary blood supply. The coronary arteries are the arteries that bring blood and oxygen to your heart. LET [facility] CARE PROVIDER KNOW ABOUT:  Any allergies you have.  All medicines you are taking, including vitamins, herbs, eye drops, creams, and over-the-counter medicines.  Previous problems you or members of your family have had with the use of anesthetics.  Any blood disorders you have.  Previous surgeries you have had.  Medical conditions you have.  Possibility of pregnancy, if this applies. RISKS AND COMPLICATIONS Generally, this is a safe procedure.  However, as with any procedure, complications can occur. Possible complications can include:  You develop pain or pressure in the following areas:  Chest.  Jaw or neck.  Between your shoulder blades.  Radiating down your left arm.  Dizziness or lightheadedness.  Shortness of breath.  Increased or irregular heartbeat.  Nausea or vomiting.  Heart attack (rare). BEFORE THE PROCEDURE  Avoid all forms of caffeine for 24 hours before your test or as directed by your health care provider. This includes coffee, tea (even decaffeinated tea), caffeinated sodas, chocolate, cocoa, and certain pain medicines.  Follow your health care provider's instructions regarding eating and drinking before the test.  Take your medicines as directed at regular times with water unless instructed otherwise. Exceptions may include:  If you have diabetes, ask how you are to take your insulin or pills. It is common to adjust insulin dosing the morning of the test.  If you are taking beta-blocker medicines, it is important to talk to your health care provider about these medicines well before the date of your test. Taking beta-blocker medicines may interfere with the test. In some cases, these medicines need to be changed or stopped 24 hours or more before the test.  If you wear a nitroglycerin patch, it may need to be removed prior to the test. Ask your health care provider if the patch should be removed before the test.  If you use an inhaler for any breathing condition, bring it with you to the test.  If you are an outpatient, bring a snack so you can eat right after the stress phase of the test.  Do not smoke for 4 hours prior to the  test or as directed by your health care provider.  Wear loose-fitting clothes and comfortable shoes for the test. This test involves walking on a treadmill. PROCEDURE   Multiple electrodes will be put on your chest. If needed, small areas of your chest may be shaved to  get better contact with the electrodes. Once the electrodes are attached to your body, multiple wires will be attached to the electrodes, and your heart rate will be monitored.  You will have an echocardiogram done at rest.  To produce this image of your heart, gel is applied to your chest, and a wand-like tool (transducer) is moved over the chest. The transducer sends the sound waves through the chest to create the moving images of your heart.  You may need an IV to receive a medication that improves the quality of the pictures.  You will then walk on a treadmill. The treadmill will be started at a slow pace. The treadmill speed and incline will gradually be increased to raise your heart rate.  At the peak of exercise, the treadmill will be stopped. You will lie down immediately on a bed so that a second echocardiogram can be done to visualize your heart's motion with exercise.  The test usually takes 30-60 minutes to complete. AFTER THE PROCEDURE  Your heart rate and blood pressure will be monitored after the test.  You may return to your normal schedule, including diet, activities, and medicines, unless your health care provider tells you otherwise.   This information is not intended to replace advice given to you by your health care provider. Make sure you discuss any questions you have with your health care provider.   Document Released: 10/24/2004 Document Revised: 10/25/2013 Document Reviewed: 06/27/2013 Elsevier Interactive Patient Education 2016 [place] Can Quit Smoking If you are ready to quit smoking or are thinking about it, congratulations! You have chosen to help yourself be healthier and live longer! There are lots of different ways to quit smoking. Nicotine gum, nicotine patches, a nicotine inhaler, or nicotine nasal spray can help with physical craving. Hypnosis, support groups, and medicines help break the habit of smoking. TIPS TO GET OFF AND STAY OFF  CIGARETTES  Learn to predict your moods. Do not let a bad situation be your excuse to have a cigarette. Some situations in your life might tempt you to have a cigarette.  Ask friends and co-workers not to smoke around you.  Make your home smoke-free.  Never have "just one" cigarette. It leads to wanting another and another. Remind yourself of your decision to quit.  On a card, make a list of your reasons for not smoking. Read it at least the same number of times a day as you have a cigarette. Tell yourself everyday, "I do not want to smoke. I choose not to smoke."  Ask someone at home or work to help you with your plan to quit smoking.  Have something planned after you eat or have a cup of coffee. Take a walk or get other exercise to perk you up. This will help to keep you from overeating.  Try a relaxation exercise to calm you down and decrease your stress. Remember, you may be tense and nervous the first two weeks after you quit. This will pass.  Find new activities to keep your hands busy. Play with a pen, coin, or rubber band. Doodle or draw things on paper.  Brush your teeth right after eating. This will help cut down  the craving for the taste of tobacco after meals. You can try mouthwash too.  Try gum, breath mints, or diet candy to keep something in your mouth. IF YOU SMOKE AND WANT TO QUIT:  Do not stock up on cigarettes. Never buy a carton. Wait until one pack is finished before you buy another.  Never carry cigarettes with you at work or at home.  Keep cigarettes as far away from you as possible. Leave them with someone else.  Never carry matches or a lighter with you.  Ask yourself, "Do I need this cigarette or is this just a reflex?"  Bet with someone that you can quit. Put cigarette money in a piggy bank every morning. If you smoke, you give up the money. If you do not smoke, by the end of the week, you keep the money.  Keep trying. It takes 21 days to change a  habit!  Talk to your doctor about using medicines to help you quit. These include nicotine replacement gum, lozenges, or skin patches.   This information is not intended to replace advice given to you by your health care provider. Make sure you discuss any questions you have with your health care provider.   Document Released: 08/16/2009 Document Revised: 01/12/2012 Document Reviewed: 08/16/2009 Elsevier Interactive Patient Education [employer].

## 2016-02-24 ENCOUNTER — Telehealth: Payer: Self-pay | Admitting: Urology

## 2016-02-24 DIAGNOSIS — R972 Elevated prostate specific antigen [PSA]: Secondary | ICD-10-CM

## 2016-02-24 NOTE — Telephone Encounter (Signed)
Patient's PSA did decrease.  I would like it repeated in 3 months.

## 2016-02-28 NOTE — Telephone Encounter (Signed)
Spoke with pt in reference to PSA. Pt voiced understanding. Pt made lab appt in July for PSA. Orders placed.

## 2016-02-29 ENCOUNTER — Encounter: Payer: Self-pay | Admitting: Sports Medicine

## 2016-02-29 ENCOUNTER — Ambulatory Visit (INDEPENDENT_AMBULATORY_CARE_PROVIDER_SITE_OTHER): Payer: Medicaid Other | Admitting: Sports Medicine

## 2016-02-29 DIAGNOSIS — M25579 Pain in unspecified ankle and joints of unspecified foot: Secondary | ICD-10-CM

## 2016-02-29 DIAGNOSIS — G5750 Tarsal tunnel syndrome, unspecified lower limb: Secondary | ICD-10-CM | POA: Diagnosis not present

## 2016-02-29 DIAGNOSIS — M79673 Pain in unspecified foot: Secondary | ICD-10-CM

## 2016-02-29 DIAGNOSIS — M21619 Bunion of unspecified foot: Secondary | ICD-10-CM

## 2016-02-29 DIAGNOSIS — M2040 Other hammer toe(s) (acquired), unspecified foot: Secondary | ICD-10-CM

## 2016-02-29 DIAGNOSIS — M2141 Flat foot [pes planus] (acquired), right foot: Secondary | ICD-10-CM

## 2016-02-29 DIAGNOSIS — M216X9 Other acquired deformities of unspecified foot: Secondary | ICD-10-CM | POA: Diagnosis not present

## 2016-02-29 DIAGNOSIS — M2142 Flat foot [pes planus] (acquired), left foot: Secondary | ICD-10-CM

## 2016-02-29 MED ORDER — METHYLPREDNISOLONE 4 MG PO TBPK: ORAL_TABLET | ORAL | Status: DC

## 2016-02-29 MED ORDER — DICLOFENAC SODIUM 75 MG PO TBEC: 75.0000 mg | DELAYED_RELEASE_TABLET | Freq: Two times a day (BID) | ORAL | Status: DC

## 2016-03-01 NOTE — Progress Notes (Signed)
Patient ID: Manuel [name], male   DOB: [birth year], 58 y.o.   MRN: [id number]  Subjective: Manuel [name] is a 60 y.o. male patient who returns to office for evaluation of Left>Right foot pain. Patient states the injection seemed to helped. Still has some pain with long work days however pain is a little better; reports that he has been soaking and icing and using OTC which help. Reports that he has been having more knee pain and swelling and thinks this is also affecting his feet. Patient denies any other pedal complaints.   Current smoker 0.5ppd   Patient Active Problem List   Diagnosis Date Noted  . Vitamin D deficiency 01/14/2016  . Chronic kidney disease (CKD), stage II (mild) 01/14/2016  . Vitamin B12 deficiency 01/14/2016  . Benign neoplasm of cecum   . Benign neoplasm of descending colon   . Special screening for malignant neoplasms, colon   . Personal history of tobacco use, presenting hazards to health 12/21/2015  . Renal lesion 11/13/2015  . History of hematuria 11/13/2015  . Chronic lower back pain 11/09/2015  . BPH (benign prostatic hyperplasia) 11/01/2015  . Elevated PSA 11/01/2015  . Hyperlipidemia 11/01/2015  . Medication monitoring encounter 11/01/2015  . Colon cancer screening 11/01/2015  . Bunion, left foot 11/01/2015  . Sickle cell trait ([place]) 01/16/2015  . Cervical spondylosis without myelopathy 01/09/2015  . Carpal tunnel syndrome 01/09/2015  . Hand numbness 01/09/2015  . Pain in shoulder 11/28/2014  . Complete rotator cuff rupture of left shoulder 09/18/2014  . Dental disease 09/18/2014  . Absolute anemia 08/19/2014  . Left ventricular hypertrophy 08/18/2014  . Clinical depression 10/03/2013  . Current smoker 10/03/2013  . Carpal tunnel syndrome of right wrist 10/03/2013  . Benign prostatic hypertrophy without urinary obstruction 11/30/2012   Current Outpatient Prescriptions on File Prior to Visit  Medication Sig Dispense Refill  . aspirin EC 81 MG  tablet Take 1 tablet (81 mg total) by mouth daily.    Marland Kitchen atorvastatin (LIPITOR) 10 MG tablet Take 1 tablet (10 mg total) by mouth at bedtime. 30 tablet 4  . buPROPion (WELLBUTRIN XL) 150 MG 24 hr tablet Take 1 tablet (150 mg total) by mouth daily. 30 tablet 2  . escitalopram (LEXAPRO) 10 MG tablet Take 1.5 tablets (15 mg total) by mouth daily. 45 tablet 2  . finasteride (PROSCAR) 5 MG tablet Take 1 tablet (5 mg total) by mouth daily. 90 tablet 3  . folic acid (FOLVITE) 1 MG tablet Take 1 mg by mouth daily.    Marland Kitchen HYDROcodone-acetaminophen (NORCO/VICODIN) 5-325 MG tablet Take 1 tablet by mouth as needed.  0  . omeprazole (PRILOSEC) 20 MG capsule Take 20 mg by mouth daily. Reported on 02/19/2016    . promethazine (PHENERGAN) 25 MG tablet Take 25 mg by mouth every 6 (six) hours as needed for nausea or vomiting. Reported on 02/19/2016    . tamsulosin (FLOMAX) 0.4 MG CAPS capsule Take 1 capsule (0.4 mg total) by mouth daily. 90 capsule 3  . Vitamin D, Ergocalciferol, (DRISDOL) 50000 units CAPS capsule Take 1 capsule (50,000 Units total) by mouth every 7 (seven) days. To build up your vitamin D level 4 capsule 1   No current facility-administered medications on file prior to visit.   Allergies  Allergen Reactions  . Penicillin G Other (See Comments)    Objective:  General: Alert and oriented x3 in no acute distress  Dermatology: No open lesions bilateral lower extremities, no webspace macerations, no  ecchymosis bilateral, all nails x 10 are short and thickened but well manicured.  Vascular: Dorsalis Pedis and Posterior Tibial pedal pulses 1/4, Capillary Fill Time 3 seconds, (+) scant pedal hair growth bilateral, no edema bilateral lower extremities, Temperature gradient within normal limits.  Neurology: Gross sensation intact via light touch bilateral. (-) Tinels sign bilateral.  Musculoskeletal: No tenderness with palpation to sinus tarsi bilateral, no tenderness with palpation along medial arch,  medial fascial band bilateral, no tenderness along Posterior tibial tendon course with medial soft tissue buldge noted, Significant tracking bunion Left>right with hammertoe 2-5 with no pain to palpation or on range, there is mild decreased ankle rom with knee extending  vs flexed resembling gastroc equnius bilateral, Subtalar joint range of motion is within normal limits, Pes planus bilateral.   Assessment and Plan: Problem List Items Addressed This Visit    None    Visit Diagnoses    Sinus tarsi syndrome, unspecified laterality    -  Primary    Relevant Medications    methylPREDNISolone (MEDROL DOSEPAK) 4 MG TBPK tablet    diclofenac (VOLTAREN) 75 MG EC tablet    Bunion        Relevant Medications    methylPREDNISolone (MEDROL DOSEPAK) 4 MG TBPK tablet    diclofenac (VOLTAREN) 75 MG EC tablet    Hammer toe, unspecified laterality        Relevant Medications    methylPREDNISolone (MEDROL DOSEPAK) 4 MG TBPK tablet    diclofenac (VOLTAREN) 75 MG EC tablet    Pes planus of both feet        Relevant Medications    methylPREDNISolone (MEDROL DOSEPAK) 4 MG TBPK tablet    diclofenac (VOLTAREN) 75 MG EC tablet    Foot pain, unspecified laterality        Relevant Medications    methylPREDNISolone (MEDROL DOSEPAK) 4 MG TBPK tablet    diclofenac (VOLTAREN) 75 MG EC tablet       -Complete examination performed -Re-Discussed treatement options; discussed bunion, hammertoe and significant pes planus deformity contributing to Sinus tarsi pain and general foot pain;conservative and Surgical; No surgical intervention will be considered until patient stops smoking; Patient reports that he is afraid to have surgery.  -Rx Voltaren to take once dose pack is completed to see if this would offer him some additional symptomatic relief since he is having knee pain as well; Advised ortho eval.  -Recommend good supportive shoes daily and OTC inserts -Recommend cont with Epsom salt soaks followed by icing as  needed  -Patient to return to office as needed or sooner if condition worsens.  Landis Martins, DPM

## 2016-03-03 ENCOUNTER — Other Ambulatory Visit: Payer: Medicaid Other

## 2016-03-03 ENCOUNTER — Telehealth: Payer: Self-pay | Admitting: Cardiovascular Disease

## 2016-03-03 NOTE — Telephone Encounter (Signed)
Pt returned call to confirm 4pm stress echo today. He understands to refrain from smoking and caffeine.

## 2016-03-03 NOTE — Telephone Encounter (Signed)
Called to confirm stress echo today. No answer.  Left message on pt VM to call the office.

## 2016-03-15 NOTE — Assessment & Plan Note (Signed)
Continue statin; will check lipids and sgpt with next labs; healthy eating, limit saturated fats

## 2016-03-15 NOTE — Assessment & Plan Note (Signed)
Encouraged counseling with home stress and caretaker issues; supportive listening provided

## 2016-03-15 NOTE — Assessment & Plan Note (Signed)
Limit NSAIDs, hydration

## 2016-03-15 NOTE — Assessment & Plan Note (Signed)
Following with urologist. 

## 2016-03-15 NOTE — Assessment & Plan Note (Signed)
Supplement with OTC

## 2016-03-15 NOTE — Assessment & Plan Note (Addendum)
Patient needs to see ortho; staff to check on status of referral entered earlier in December 2016

## 2016-03-15 NOTE — Assessment & Plan Note (Signed)
Prescription vitamin D once a week for 8 weeks, then OTC 1000 iu daily

## 2016-03-15 NOTE — Assessment & Plan Note (Signed)
With mild microcytosis

## 2016-03-15 NOTE — Assessment & Plan Note (Addendum)
Referral was put in while I was working at my last practice; will ask staff to help him see where that referral stands; he has rotator tuff tear and needs to see ortho

## 2016-04-01 NOTE — Telephone Encounter (Signed)
Spoke w/ pt.  Reviewed instructions for Stress ECHO. He verbalizes understanding and will be here @ 1:00 tomorrow.

## 2016-04-02 ENCOUNTER — Ambulatory Visit (INDEPENDENT_AMBULATORY_CARE_PROVIDER_SITE_OTHER): Payer: Medicaid Other

## 2016-04-02 DIAGNOSIS — R079 Chest pain, unspecified: Secondary | ICD-10-CM | POA: Diagnosis not present

## 2016-04-02 LAB — ECHOCARDIOGRAM STRESS TEST
CHL CUP MPHR: 162 {beats}/min
CHL CUP RESTING HR STRESS: 89 {beats}/min
CSEPEDS: 48 s
Estimated workload: 11.4 METS
Exercise duration (min): 9 min
Peak HR: 139 {beats}/min
Percent HR: 85 %

## 2016-04-21 ENCOUNTER — Other Ambulatory Visit: Payer: Self-pay | Admitting: Family Medicine

## 2016-04-21 DIAGNOSIS — E559 Vitamin D deficiency, unspecified: Secondary | ICD-10-CM

## 2016-04-21 DIAGNOSIS — E538 Deficiency of other specified B group vitamins: Secondary | ICD-10-CM

## 2016-04-21 DIAGNOSIS — N182 Chronic kidney disease, stage 2 (mild): Secondary | ICD-10-CM

## 2016-04-21 NOTE — Assessment & Plan Note (Signed)
Check B12 

## 2016-04-21 NOTE — Assessment & Plan Note (Signed)
Check level 

## 2016-04-21 NOTE — Assessment & Plan Note (Signed)
Check BMP .  

## 2016-04-21 NOTE — Telephone Encounter (Signed)
Please ask pt to come in for labs sometime this week or early next; we'll see if he still needs the vit D Rx or if we can switch to OTC I deleted the [facility] future orders and entered new [facility] future orders; thank you

## 2016-04-22 NOTE — Telephone Encounter (Signed)
Left pt voicemail

## 2016-05-08 ENCOUNTER — Other Ambulatory Visit: Payer: Medicaid Other

## 2016-05-08 ENCOUNTER — Telehealth: Payer: Self-pay | Admitting: Family Medicine

## 2016-05-08 ENCOUNTER — Other Ambulatory Visit: Payer: Self-pay

## 2016-05-08 DIAGNOSIS — E559 Vitamin D deficiency, unspecified: Secondary | ICD-10-CM

## 2016-05-08 DIAGNOSIS — E538 Deficiency of other specified B group vitamins: Secondary | ICD-10-CM

## 2016-05-08 DIAGNOSIS — N182 Chronic kidney disease, stage 2 (mild): Secondary | ICD-10-CM

## 2016-05-08 DIAGNOSIS — R972 Elevated prostate specific antigen [PSA]: Secondary | ICD-10-CM

## 2016-05-08 LAB — BASIC METABOLIC PANEL WITH GFR
BUN: 12 mg/dL (ref 7–25)
CO2: 26 mmol/L (ref 20–31)
Calcium: 8.7 mg/dL (ref 8.6–10.3)
Chloride: 104 mmol/L (ref 98–110)
Creat: 1.17 mg/dL (ref 0.70–1.33)
GFR, EST AFRICAN AMERICAN: 79 mL/min (ref >=60)
GFR, EST NON AFRICAN AMERICAN: 68 mL/min (ref >=60)
GLUCOSE: 76 mg/dL (ref 65–99)
POTASSIUM: 4 mmol/L (ref 3.5–5.3)
SODIUM: 140 mmol/L (ref 135–146)

## 2016-05-08 LAB — VITAMIN B12: Vitamin B-12: 314 pg/mL (ref 200–1100)

## 2016-05-08 NOTE — Telephone Encounter (Signed)
Patient is requesting a return call. Would like to speak with pertaining to stress and anxiety. He will be upstairs giving bloodwork and will come back down to speak with you.

## 2016-05-08 NOTE — Telephone Encounter (Signed)
I spoke with patient here in the hallway; he and his wife have separated; he has upcoming appt; encouragement, supportive listening provided

## 2016-05-09 ENCOUNTER — Other Ambulatory Visit: Payer: Self-pay

## 2016-05-09 ENCOUNTER — Telehealth: Payer: Self-pay

## 2016-05-09 LAB — PSA: Prostate Specific Ag, Serum: 4.7 ng/mL — ABNORMAL HIGH (ref 0.0–4.0)

## 2016-05-09 LAB — VITAMIN D 25 HYDROXY (VIT D DEFICIENCY, FRACTURES): VIT D 25 HYDROXY: 34 ng/mL (ref 30–100)

## 2016-05-09 NOTE — Telephone Encounter (Signed)
Spoke with pt in reference to PSA. Pt voiced understanding. Pt was transferred to the front to make f/u appt.

## 2016-05-09 NOTE — Telephone Encounter (Signed)
-----   Message from Nori Riis, PA-C sent at 05/09/2016  8:17 AM EDT ----- Patient's PSA decreased again.  I would like to see him in three months for UA, IPSS, SHIM and PSA.

## 2016-05-13 ENCOUNTER — Telehealth: Payer: Self-pay | Admitting: Family Medicine

## 2016-05-13 NOTE — Telephone Encounter (Signed)
Can you please get a heads up for this and try to help him?; I have to go straight from here to [facility] tonight? Thank

## 2016-05-13 NOTE — Telephone Encounter (Signed)
I have tried to call pt several times and have left multiple voicemails

## 2016-05-13 NOTE — Telephone Encounter (Signed)
Pt is asking for Dr Sanda Klein to call him. He states its very important

## 2016-05-15 ENCOUNTER — Telehealth: Payer: Self-pay | Admitting: Family Medicine

## 2016-05-15 NOTE — Telephone Encounter (Signed)
PT SAID THAT THE DR KNOWS HIS SITUATION AND HE IS ON HIS WAY TO SOCIAL SERVICES, BUT HE NEEDS TO SPEAK WITH THE DR ASAP.

## 2016-05-15 NOTE — Telephone Encounter (Signed)
I called, left msg

## 2016-05-20 ENCOUNTER — Ambulatory Visit (INDEPENDENT_AMBULATORY_CARE_PROVIDER_SITE_OTHER): Payer: Medicaid Other | Admitting: Family Medicine

## 2016-05-20 NOTE — Telephone Encounter (Signed)
I left another message; he was supposed to have had an appointment with me today and it appears it was cancelled (?); I explained that I'm here for him and please reschedule appt or call if anything I can help him with

## 2016-06-02 ENCOUNTER — Encounter: Payer: Self-pay | Admitting: Family Medicine

## 2016-06-02 ENCOUNTER — Ambulatory Visit (INDEPENDENT_AMBULATORY_CARE_PROVIDER_SITE_OTHER): Payer: Medicaid Other | Admitting: Family Medicine

## 2016-06-02 VITALS — BP 122/80 | HR 77 | Temp 98.1°F | Resp 16 | Wt 162.0 lb

## 2016-06-02 DIAGNOSIS — S90822D Blister (nonthermal), left foot, subsequent encounter: Secondary | ICD-10-CM

## 2016-06-02 DIAGNOSIS — R05 Cough: Secondary | ICD-10-CM | POA: Diagnosis not present

## 2016-06-02 DIAGNOSIS — F329 Major depressive disorder, single episode, unspecified: Secondary | ICD-10-CM | POA: Diagnosis not present

## 2016-06-02 DIAGNOSIS — F17200 Nicotine dependence, unspecified, uncomplicated: Secondary | ICD-10-CM

## 2016-06-02 DIAGNOSIS — R059 Cough, unspecified: Secondary | ICD-10-CM | POA: Insufficient documentation

## 2016-06-02 DIAGNOSIS — Z720 Tobacco use: Secondary | ICD-10-CM | POA: Diagnosis not present

## 2016-06-02 DIAGNOSIS — M25512 Pain in left shoulder: Secondary | ICD-10-CM

## 2016-06-02 DIAGNOSIS — R0602 Shortness of breath: Secondary | ICD-10-CM

## 2016-06-02 DIAGNOSIS — S90822A Blister (nonthermal), left foot, initial encounter: Secondary | ICD-10-CM | POA: Insufficient documentation

## 2016-06-02 DIAGNOSIS — F32A Depression, unspecified: Secondary | ICD-10-CM

## 2016-06-02 NOTE — Assessment & Plan Note (Signed)
Encouraged patient to quit smoking.  

## 2016-06-02 NOTE — Progress Notes (Signed)
BP 122/80   Pulse 77   Temp 98.1 F (36.7 C) (Oral)   Resp 16   Wt 162 lb (73.5 kg)   SpO2 97%   BMI 23.24 kg/m    Subjective:    Patient ID: Manuel [name], male    DOB: Mar 15, 1956, 60 y.o.   MRN: [id number]  HPI: Manuel [name] is a 60 y.o. male  Chief Complaint  Patient presents with  . Follow-up   He is going through a separation with his wife; he is not seeing a Social worker; he says he needs to worry about his blisters on his feet and rotator cuff; was referred to orthopaedist but not able to go because of personal issues and schedule; would like another referral; however, looking at his medicines, he has bottle of meloxicam from [facility] (ortho)  He has blisters on the bottom and the side of the left foot only; he saw podiatrist; problem has been going on almost 3 years; podiatrist said bolts would be needed and pt not interested in surgery  Social worker wants him checked for COPD; he had a chest CT in February 2017: IMPRESSION: No suspicious pulmonary nodules. Lung-RADS Category 1, negative. Continue annual screening with low-dose chest CT without contrast in 12 months.  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  Electronically Signed   By: Julian Hy M.D.   On: 12/24/2015 08:25  He is having shortness of breath and cough; coughs more at night; trying to quit smoking; not smoking late in the evening Smoking 1 pack every 2-3 days  Low vitamin B12 was low, 314 in July; he is not taking any B12 supplementation  He has bottle of both diclofenac and meloxicam; I explained he cannot take both of those together; he says he has actually not been taking the diclofenac; the meloxicam was from 05/26/2016; he saw an ortho for back and shoulder, Reche Dixon; he has an empty bottle of hydrocodone and asked about getting a refill; I did not prescribe that to him (and I  explained he'll need to talk to ortho about that)  Depression screen [facility] 2/9 06/02/2016 06/02/2016 02/19/2016 12/06/2015  Decreased Interest 1 0 0 1  Down, Depressed, Hopeless 3 1 1 1   PHQ - 2 Score 4 1 1 2   Altered sleeping 3 - - 3  Tired, decreased energy 3 - - 3  Change in appetite 1 - - 0  Feeling bad or failure about yourself  1 - - 1  Trouble concentrating 1 - - 1  Moving slowly or fidgety/restless 1 - - 0  Suicidal thoughts 0 - - 1  PHQ-9 Score 14 - - 11  Difficult doing work/chores Very difficult - - Somewhat difficult  no thoughts of self-harm or hurting anyone else  Relevant past medical, surgical, family and social history reviewed Past Medical History:  Diagnosis Date  . Anemia   . Anxiety   . Asthma   . Bilateral carpal tunnel syndrome   . Bilateral hand numbness   . BPH without urinary obstruction   . Cervical spondylosis without myelopathy   . Complete tear of rotator cuff    bilateral  . COPD (chronic obstructive pulmonary disease) ([place])   . Depression   . History of stomach ulcers   . HLD (hyperlipidemia)   . LVH (left ventricular hypertrophy)   . Personal history of tobacco use, presenting hazards to health 12/21/2015  .  Poor dentition   . Sickle cell trait (HCC)   . Sleep apnea   . Smoker    Past Surgical History:  Procedure Laterality Date  . COLONOSCOPY WITH PROPOFOL N/A 12/25/2015   Procedure: COLONOSCOPY WITH PROPOFOL;  Surgeon: Midge Minium, MD;  Location: [facility] ENDOSCOPY;  Service: Endoscopy;  Laterality: N/A;  . CYSTOURETHROSCOPY  08/25/12   with ureteral cathization w/wo retrograde pyelogram  . ROTATOR CUFF REPAIR Right 01/25/15  . SHOULDER ARTHROSCOPY WITH SUBACROMIAL DECOMPRESSION Right [id number]  . TRANSURETHRAL RESECTION OF PROSTATE  08/25/12   Family History  Problem Relation Age of Onset  . Aneurysm Mother   . Hypertension Mother   . Diabetes Father   . Hypertension Father   . Cancer Maternal Aunt   . Cancer Maternal Uncle   . Diabetes  Paternal Uncle   . COPD Neg Hx   . Stroke Neg Hx   . Heart disease Cousin   . Kidney disease Neg Hx   . Prostate cancer Father   . Prostate cancer Paternal Uncle    Social History  Substance Use Topics  . Smoking status: Current Every Day Smoker    Packs/day: 0.50    Years: 35.00    Types: Cigarettes  . Smokeless tobacco: Never Used  . Alcohol use No    Interim medical history since last visit reviewed. Allergies and medications reviewed  Review of Systems Per HPI unless specifically indicated above     Objective:    BP 122/80   Pulse 77   Temp 98.1 F (36.7 C) (Oral)   Resp 16   Wt 162 lb (73.5 kg)   SpO2 97%   BMI 23.24 kg/m   Wt Readings from Last 3 Encounters:  06/02/16 162 lb (73.5 kg)  02/22/16 169 lb 4 oz (76.8 kg)  02/19/16 171 lb 11.2 oz (77.9 kg)    Physical Exam  Constitutional: He appears well-developed and well-nourished. No distress.  HENT:  Head: Normocephalic and atraumatic.  Eyes: EOM are normal. No scleral icterus.  Neck: No thyromegaly present.  Cardiovascular: Normal rate and regular rhythm.   Pulmonary/Chest: Effort normal and breath sounds normal.  Abdominal: Soft. Bowel sounds are normal. He exhibits no distension.  Musculoskeletal: He exhibits no edema.       Left shoulder: He exhibits decreased range of motion. He exhibits no swelling and no deformity.  Neurological: Coordination normal.  Skin: Skin is warm and dry. He is not diaphoretic. No pallor.  4-5 blisters on the sides and sole of the left foot; no large bullae; no scale  Psychiatric: His behavior is normal. Judgment and thought content normal. His mood appears not anxious. His affect is not blunt, not labile and not inappropriate. He is not slowed and not withdrawn. Cognition and memory are not impaired. He does not express impulsivity or inappropriate judgment. He exhibits a depressed mood. He expresses no homicidal and no suicidal ideation.  Very cooperative, good eye contact  with examiner   Results for orders placed or performed in visit on 05/08/16  PSA  Result Value Ref Range   Prostate Specific Ag, Serum 4.7 (H) 0.0 - 4.0 ng/mL  managed by urologist     Assessment & Plan:   Problem List Items Addressed This Visit      Musculoskeletal and Integument   Friction blisters of sole of left foot    Refer to dermatologist      Relevant Orders   Ambulatory referral to Dermatology     Other  Shortness of breath - Primary    Will get PFTs to see if COPD      Pain in shoulder    Patient suspects rotator cuff injury; will put in another referral to orthopaedist      Current smoker    Encouraged patient to quit smoking      Cough    Check PFTs      Clinical depression    Refer to psychologist; stressed importance of taking care of himself; if any thoughts of self-harm, seek medical help immediately; weight loss noted, which I suspect is related to stress, living alone now; will see him for close f/u      Relevant Orders   Ambulatory referral to Psychology    Other Visit Diagnoses   None.     Follow up plan: Return in about 4 weeks (around 06/30/2016) for follow-up.  An after-visit summary was printed and given to the patient at [facility].  Please see the patient instructions which may contain other information and recommendations beyond what is mentioned above in the assessment and plan.  Meds ordered this encounter  Medications  . meloxicam (MOBIC) 15 MG tablet    Refill:  0  . cyanocobalamin 500 MCG tablet    Sig: Take 1 tablet (500 mcg total) by mouth daily.   Medications Discontinued During This Encounter  Medication Reason  . folic acid (FOLVITE) 1 MG tablet   . methylPREDNISolone (MEDROL DOSEPAK) 4 MG TBPK tablet   . Vitamin D, Ergocalciferol, (DRISDOL) 50000 units CAPS capsule    Orders Placed This Encounter  Procedures  . Ambulatory referral to Psychology  . Ambulatory referral to Dermatology

## 2016-06-02 NOTE — Assessment & Plan Note (Signed)
Check PFTs

## 2016-06-02 NOTE — Assessment & Plan Note (Addendum)
Refer to psychologist; stressed importance of taking care of himself; if any thoughts of self-harm, seek medical help immediately; weight loss noted, which I suspect is related to stress, living alone now; will see him for close f/u

## 2016-06-02 NOTE — Assessment & Plan Note (Signed)
Refer to dermatologist 

## 2016-06-02 NOTE — Assessment & Plan Note (Signed)
Patient suspects rotator cuff injury; will put in another referral to orthopaedist

## 2016-06-02 NOTE — Patient Instructions (Addendum)
If you are concerned about your wife, you have to call Adult Protective Services; please call today The law requires anyone who believes an elderly individual or disabled adult is being abused, neglected, or exploited and is in need of protective services to report this concern to the [place]. If you have concerns that a disabled and/or elderly adult is not being cared for properly, being mistreated, or is unsafe, please call our office and make an Adult Protective Services referral. To report abuse or neglect of an adult, please call our Adult Abuse Hotline at 416-314-9560) [phone] or by visiting our office located at [phone] [place], [place], [place] [address]. Night, holiday, and weekend reporting may be made through contact with the [place] (0000000, [facility] [employer] ([id number] or the [place] [phone]. These agencies are always open and willing to contact our on-call worker. Our worker will contact you regarding your report.  Do start vitamin B12 500 mcg once a day (that's over-the-counter)  Do NOT take meloxicam AND diclofenac; that can damage your kidneys Do call your orthopaedist about your back and shoulder, Reche Dixon Phone: [phone]  I do encourage you to quit smoking Call [phone] to sign up for smoking cessation classes You can call 1-800-QUIT-NOW to talk with a smoking cessation coach  We'll have you start to work with a counselor about what you're going through; if you have any dark thoughts or thoughts or hurting yourself or others, call 911  We'll get your breathing tests scheduled If you have not heard anything from my staff in a week about any orders/referrals/studies from today, please contact us here to follow-up [phone]   Shoulder Pain The shoulder is the joint that connects your arms to your body. The bones that form the shoulder joint include  the upper arm bone (humerus), the shoulder blade (scapula), and the collarbone (clavicle). The top of the humerus is shaped like a ball and fits into a rather flat socket on the scapula (glenoid cavity). A combination of muscles and strong, fibrous tissues that connect muscles to bones (tendons) support your shoulder joint and hold the ball in the socket. Small, fluid-filled sacs (bursae) are located in different areas of the joint. They act as cushions between the bones and the overlying soft tissues and help reduce friction between the gliding tendons and the bone as you move your arm. Your shoulder joint allows a wide range of motion in your arm. This range of motion allows you to do things like scratch your back or throw a ball. However, this range of motion also makes your shoulder more prone to pain from overuse and injury. Causes of shoulder pain can originate from both injury and overuse and usually can be grouped in the following four categories:  Redness, swelling, and pain (inflammation) of the tendon (tendinitis) or the bursae (bursitis).  Instability, such as a dislocation of the joint.  Inflammation of the joint (arthritis).  Broken bone (fracture). HOME CARE INSTRUCTIONS   Apply ice to the sore area.  Put ice in a plastic bag.  Place a towel between your skin and the bag.  Leave the ice on for 15-20 minutes, 3-4 times per day for the first 2 days, or as directed by your health care provider.  Stop using cold packs if they do not help with the pain.  If you have a shoulder sling or immobilizer, wear it as long as your caregiver  instructs. Only remove it to shower or bathe. Move your arm as little as possible, but keep your hand moving to prevent swelling.  Squeeze a soft ball or foam pad as much as possible to help prevent swelling.  Only take over-the-counter or prescription medicines for pain, discomfort, or fever as directed by your caregiver. SEEK MEDICAL CARE IF:    Your shoulder pain increases, or new pain develops in your arm, hand, or fingers.  Your hand or fingers become cold and numb.  Your pain is not relieved with medicines. SEEK IMMEDIATE MEDICAL CARE IF:   Your arm, hand, or fingers are numb or tingling.  Your arm, hand, or fingers are significantly swollen or turn white or blue. MAKE SURE YOU:   Understand these instructions.  Will watch your condition.  Will get help right away if you are not doing well or get worse.   This information is not intended to replace advice given to you by your health care provider. Make sure you discuss any questions you have with your health care provider.   Document Released: 07/30/2005 Document Revised: 11/10/2014 Document Reviewed: 02/12/2015 Elsevier Interactive Patient Education 2016 [employer].  Steps to Quit Smoking  Smoking tobacco can be harmful to your health and can affect almost every organ in your body. Smoking puts you, and those around you, at risk for developing many serious chronic diseases. Quitting smoking is difficult, but it is one of the best things that you can do for your health. It is never too late to quit. WHAT ARE THE BENEFITS OF QUITTING SMOKING? When you quit smoking, you lower your risk of developing serious diseases and conditions, such as:  Lung cancer or lung disease, such as COPD.  Heart disease.  Stroke.  Heart attack.  Infertility.  Osteoporosis and bone fractures. Additionally, symptoms such as coughing, wheezing, and shortness of breath may get better when you quit. You may also find that you get sick less often because your body is stronger at fighting off colds and infections. If you are pregnant, quitting smoking can help to reduce your chances of having a baby of low birth weight. HOW DO I GET READY TO QUIT? When you decide to quit smoking, create a plan to make sure that you are successful. Before you quit:  Pick a date to quit. Set a date  within the next two weeks to give you time to prepare.  Write down the reasons why you are quitting. Keep this list in places where you will see it often, such as on your bathroom mirror or in your car or wallet.  Identify the people, places, things, and activities that make you want to smoke (triggers) and avoid them. Make sure to take these actions:  Throw away all cigarettes at home, at work, and in your car.  Throw away smoking accessories, such as Scientist, research (medical).  Clean your car and make sure to empty the ashtray.  Clean your home, including curtains and carpets.  Tell your family, friends, and coworkers that you are quitting. Support from your loved ones can make quitting easier.  Talk with your health care provider about your options for quitting smoking.  Find out what treatment options are covered by your health insurance. WHAT STRATEGIES CAN I USE TO QUIT SMOKING?  Talk with your healthcare provider about different strategies to quit smoking. Some strategies include:  Quitting smoking altogether instead of gradually lessening how much you smoke over a period of time. Research  shows that quitting "cold [place]" is more successful than gradually quitting.  Attending in-person counseling to help you build problem-solving skills. You are more likely to have success in quitting if you attend several counseling sessions. Even short sessions of 10 minutes can be effective.  Finding resources and support systems that can help you to quit smoking and remain smoke-free after you quit. These resources are most helpful when you use them often. They can include:  Online chats with a Social worker.  Telephone quitlines.  Printed Furniture conservator/restorer.  Support groups or group counseling.  Text messaging programs.  Mobile phone applications.  Taking medicines to help you quit smoking. (If you are pregnant or breastfeeding, talk with your health care provider first.) Some medicines  contain nicotine and some do not. Both types of medicines help with cravings, but the medicines that include nicotine help to relieve withdrawal symptoms. Your health care provider may recommend:  Nicotine patches, gum, or lozenges.  Nicotine inhalers or sprays.  Non-nicotine medicine that is taken by mouth. Talk with your health care provider about combining strategies, such as taking medicines while you are also receiving in-person counseling. Using these two strategies together makes you more likely to succeed in quitting than if you used either strategy on its own. If you are pregnant or breastfeeding, talk with your health care provider about finding counseling or other support strategies to quit smoking. Do not take medicine to help you quit smoking unless told to do so by your health care provider. WHAT THINGS CAN I DO TO MAKE IT EASIER TO QUIT? Quitting smoking might feel overwhelming at first, but there is a lot that you can do to make it easier. Take these important actions:  Reach out to your family and friends and ask that they support and encourage you during this time. Call telephone quitlines, reach out to support groups, or work with a counselor for support.  Ask people who smoke to avoid smoking around you.  Avoid places that trigger you to smoke, such as bars, parties, or smoke-break areas at work.  Spend time around people who do not smoke.  Lessen stress in your life, because stress can be a smoking trigger for some people. To lessen stress, try:  Exercising regularly.  Deep-breathing exercises.  Yoga.  Meditating.  Performing a body scan. This involves closing your eyes, scanning your body from head to toe, and noticing which parts of your body are particularly tense. Purposefully relax the muscles in those areas.  Download or purchase mobile phone or tablet apps (applications) that can help you stick to your quit plan by providing reminders, tips, and  encouragement. There are many free apps, such as QuitGuide from the [employer] Office manager for Disease Control and Prevention). You can find other support for quitting smoking (smoking cessation) through smokefree.gov and other websites. HOW WILL I FEEL WHEN I QUIT SMOKING? Within the first 24 hours of quitting smoking, you may start to feel some withdrawal symptoms. These symptoms are usually most noticeable 2-3 days after quitting, but they usually do not last beyond 2-3 weeks. Changes or symptoms that you might experience include:  Mood swings.  Restlessness, anxiety, or irritation.  Difficulty concentrating.  Dizziness.  Strong cravings for sugary foods in addition to nicotine.  Mild weight gain.  Constipation.  Nausea.  Coughing or a sore throat.  Changes in how your medicines work in your body.  A depressed mood.  Difficulty sleeping (insomnia). After the first 2-3 weeks of quitting,  you may start to notice more positive results, such as:  Improved sense of smell and taste.  Decreased coughing and sore throat.  Slower heart rate.  Lower blood pressure.  Clearer skin.  The ability to breathe more easily.  Fewer sick days. Quitting smoking is very challenging for most people. Do not get discouraged if you are not successful the first time. Some people need to make many attempts to quit before they achieve long-term success. Do your best to stick to your quit plan, and talk with your health care provider if you have any questions or concerns.   This information is not intended to replace advice given to you by your health care provider. Make sure you discuss any questions you have with your health care provider.   Document Released: 10/14/2001 Document Revised: 03/06/2015 Document Reviewed: 03/06/2015 Elsevier Interactive Patient Education [employer].

## 2016-06-02 NOTE — Assessment & Plan Note (Signed)
Will get PFTs to see if COPD

## 2016-06-17 NOTE — Telephone Encounter (Signed)
COMPLETED

## 2016-06-25 ENCOUNTER — Telehealth: Payer: Self-pay | Admitting: Family Medicine

## 2016-06-26 NOTE — Telephone Encounter (Signed)
I spoke w/patient He just gave some info, she's home now; wife spoke too

## 2016-06-30 ENCOUNTER — Ambulatory Visit: Payer: Medicaid Other | Admitting: Family Medicine

## 2016-07-14 ENCOUNTER — Ambulatory Visit (INDEPENDENT_AMBULATORY_CARE_PROVIDER_SITE_OTHER): Payer: Medicaid Other | Admitting: Urology

## 2016-07-14 ENCOUNTER — Encounter: Payer: Self-pay | Admitting: Urology

## 2016-07-14 VITALS — BP 123/86 | HR 78 | Ht 70.0 in | Wt 161.9 lb

## 2016-07-14 DIAGNOSIS — N138 Other obstructive and reflux uropathy: Secondary | ICD-10-CM

## 2016-07-14 DIAGNOSIS — N529 Male erectile dysfunction, unspecified: Secondary | ICD-10-CM

## 2016-07-14 DIAGNOSIS — Z87448 Personal history of other diseases of urinary system: Secondary | ICD-10-CM

## 2016-07-14 DIAGNOSIS — N401 Enlarged prostate with lower urinary tract symptoms: Secondary | ICD-10-CM | POA: Diagnosis not present

## 2016-07-14 DIAGNOSIS — N528 Other male erectile dysfunction: Secondary | ICD-10-CM | POA: Diagnosis not present

## 2016-07-14 DIAGNOSIS — R972 Elevated prostate specific antigen [PSA]: Secondary | ICD-10-CM

## 2016-07-14 LAB — MICROSCOPIC EXAMINATION: Bacteria, UA: NONE SEEN

## 2016-07-14 LAB — URINALYSIS, COMPLETE
BILIRUBIN UA: NEGATIVE
GLUCOSE, UA: NEGATIVE
KETONES UA: NEGATIVE
Nitrite, UA: NEGATIVE
PROTEIN UA: NEGATIVE
RBC UA: NEGATIVE
SPEC GRAV UA: 1.015 (ref 1.005–1.030)
Urobilinogen, Ur: 0.2 mg/dL (ref 0.2–1.0)
pH, UA: 5.5 (ref 5.0–7.5)

## 2016-07-14 NOTE — Progress Notes (Signed)
2:19 PM   Manuel [name] 09-Jun-1956 BP:[id number]  Referring provider: Arnetha Courser, MD [address] [name], [name] [address]  Chief Complaint  Patient presents with  . Benign Prostatic Hypertrophy    6 month follow up   . Elevated PSA    HPI: Patient is a 60 year old African-American male with a history of gross hematuria, erectile dysfunction and BPH with LUTS who presents today for a 6 months follow up.    History of gross hematuria Patient had an episode of clot retention in 2013.  He underwent a CT Urogram, cystoscopy with bilateral retrogrades and ultimately a TURP with Dr. Darcus Austin at [facility].  No malignancies were discovered.  Hematuria was from his enlarged, friable prostate.  He has not had any gross hematuria since that time.  A too small to characterize lesion was seen in the left midpole of the kidney on the CT Urogram in 2013.  Renal ultrasound completed on 02/14/2016 noted no renal masses, no hydronephrosis and an enlarged prostate.  His UA today was unremarkable.  Erectile dysfunction His SHIM score is 7, which is severe erectile dysfunction.   His previous SHIM score was 20.  He has been having difficulty with erections since starting the finasteride.   His major complaint is now having the confidence to achieve an erections.  His libido is preserved.   His risk factors for ED are hyperlipidemia, depression, age, BPH and smoking.  He denies any painful erections or curvatures with his erections.   He states that his wife has bi-polar disease and has left him at this time.       SHIM    Row Name 07/14/16 1359         SHIM: Over the last 6 months:   How do you rate your confidence that you could get and keep an erection? Very Low     When you had erections with sexual stimulation, how often were your erections hard enough for penetration (entering your partner)? A Few Times (much less than half the time)     During sexual intercourse, how often  were you able to maintain your erection after you had penetrated (entered) your partner? Very Difficult     During sexual intercourse, how difficult was it to maintain your erection to completion of intercourse? Extremely Difficult     When you attempted sexual intercourse, how often was it satisfactory for you? Extremely Difficult       SHIM Total Score   SHIM 7        Score: 1-7 Severe ED 8-11 Moderate ED 12-16 Mild-Moderate ED 17-21 Mild ED 22-25 No ED   BPH WITH LUTS His IPSS score today is 5, which is mild lower urinary tract symptomatology. He is mixed with his quality life due to his urinary symptoms.  His previous PVR was 22 mL. His previous I PSS score was 15/4.  His major complaint today a weak urinary stream.  He has had these symptoms for over three years.  He doesn't do that he does not drink a lot of fluids during the day.   He has been taking his tamsulosin and finasteride daily.   He denies any dysuria, hematuria or suprapubic pain.  His has had a TURP.  He also denies any recent fevers, chills, nausea or vomiting.   He has a family history of PCa, with father and paternal father with prostate cancer.  IPSS    Row Name 07/14/16 1300         International Prostate Symptom Score   How often have you had the sensation of not emptying your bladder? Less than 1 in 5     How often have you had to urinate less than every two hours? Less than 1 in 5 times     How often have you found you stopped and started again several times when you urinated? Less than 1 in 5 times     How often have you found it difficult to postpone urination? Less than 1 in 5 times     How often have you had a weak urinary stream? Not at All     How often have you had to strain to start urination? Not at All     How many times did you typically get up at night to urinate? 1 Time     Total IPSS Score 5       Quality of Life due to urinary symptoms   If you were to spend the rest of your life  with your urinary condition just the way it is now how would you feel about that? Mixed        Score:  1-7 Mild 8-19 Moderate 20-35 Severe   PMH: Past Medical History:  Diagnosis Date  . Anemia   . Anxiety   . Asthma   . Bilateral carpal tunnel syndrome   . Bilateral hand numbness   . BPH without urinary obstruction   . Cervical spondylosis without myelopathy   . Complete tear of rotator cuff    bilateral  . COPD (chronic obstructive pulmonary disease) ([place])   . Depression   . History of stomach ulcers   . HLD (hyperlipidemia)   . LVH (left ventricular hypertrophy)   . Personal history of tobacco use, presenting hazards to health 12/21/2015  . Poor dentition   . Sickle cell trait ([place])   . Sleep apnea   . Smoker     Surgical History: Past Surgical History:  Procedure Laterality Date  . COLONOSCOPY WITH PROPOFOL N/A 12/25/2015   Procedure: COLONOSCOPY WITH PROPOFOL;  Surgeon: Lucilla Lame, MD;  Location: [facility] END[facility];  Service: Endoscopy;  Laterality: N/A;  . CYSTOURETHROSCOPY  08/25/12   with ureteral cathization w/wo retrograde pyelogram  . ROTATOR CUFF REPAIR Right 01/25/15  . SHOULDER ARTHROSCOPY WITH SUBACROMIAL DECOMPRESSION Right 032416  . TRANSURETHRAL RESECTION OF PROSTATE  08/25/12    Home Medications:    Medication List       Accurate as of 07/14/16  2:19 PM. Always use your most recent med list.          aspirin EC 81 MG tablet Take 1 tablet (81 mg total) by mouth daily.   atorvastatin 10 MG tablet Commonly known as:  LIPITOR Take 1 tablet (10 mg total) by mouth at bedtime.   buPROPion 150 MG 24 hr tablet Commonly known as:  WELLBUTRIN XL Take 1 tablet (150 mg total) by mouth daily.   cyanocobalamin 500 MCG tablet Take 1 tablet (500 mcg total) by mouth daily.   diclofenac 75 MG EC tablet Commonly known as:  VOLTAREN Take 1 tablet (75 mg total) by mouth 2 (two) times daily.   escitalopram 10 MG tablet Commonly known as:  LEXAPRO Take  1.5 tablets (15 mg total) by mouth daily.   finasteride 5 MG tablet Commonly known as:  PROSCAR Take 1 tablet (5 mg total) by mouth  daily.   HYDROcodone-acetaminophen 5-325 MG tablet Commonly known as:  NORCO/VICODIN Take 1 tablet by mouth as needed.   meloxicam 15 MG tablet Commonly known as:  MOBIC   omeprazole 20 MG capsule Commonly known as:  PRILOSEC Take 20 mg by mouth daily. Reported on 02/19/2016   promethazine 25 MG tablet Commonly known as:  PHENERGAN Take 25 mg by mouth every 6 (six) hours as needed for nausea or vomiting. Reported on 02/19/2016   tamsulosin 0.4 MG Caps capsule Commonly known as:  FLOMAX Take 1 capsule (0.4 mg total) by mouth daily.       Allergies:  Allergies  Allergen Reactions  . Penicillin G Other (See Comments)    Family History: Family History  Problem Relation Age of Onset  . Aneurysm Mother   . Hypertension Mother   . Diabetes Father   . Hypertension Father   . Prostate cancer Father   . Cancer Maternal Aunt   . Cancer Maternal Uncle   . Diabetes Paternal Uncle   . Heart disease Cousin   . Prostate cancer Paternal Uncle   . COPD Neg Hx   . Stroke Neg Hx   . Kidney disease Neg Hx     Social History:  reports that he has been smoking Cigarettes.  He has a 17.50 pack-year smoking history. He has never used smokeless tobacco. He reports that he does not drink alcohol or use drugs.  ROS: UROLOGY Frequent Urination?: No Hard to postpone urination?: No Burning/pain with urination?: No Get up at night to urinate?: No Leakage of urine?: No Urine stream starts and stops?: No Trouble starting stream?: No Do you have to strain to urinate?: No Blood in urine?: No Urinary tract infection?: No Sexually transmitted disease?: No Injury to kidneys or bladder?: No Painful intercourse?: No Weak stream?: No Erection problems?: No Penile pain?: No  Gastrointestinal Nausea?: No Vomiting?: No Indigestion/heartburn?: No Diarrhea?:  No Constipation?: No  Constitutional Fever: No Night sweats?: No Weight loss?: No Fatigue?: No  Skin Skin rash/lesions?: Yes Itching?: No  Eyes Blurred vision?: Yes Double vision?: No  Ears/Nose/Throat Sore throat?: No Sinus problems?: No  Hematologic/Lymphatic Swollen glands?: No Easy bruising?: No  Cardiovascular Leg swelling?: Yes Chest pain?: No  Respiratory Cough?: Yes Shortness of breath?: Yes  Endocrine Excessive thirst?: No  Musculoskeletal Back pain?: Yes Joint pain?: Yes  Neurological Headaches?: No Dizziness?: No  Psychologic Depression?: No Anxiety?: No  Physical Exam: BP 123/86   Pulse 78   Ht 5\' 10"  (1.778 m)   Wt 161 lb 14.4 oz (73.4 kg)   BMI 23.23 kg/m   Constitutional: Well nourished. Alert and oriented, No acute distress. HEENT: [place] AT, moist mucus membranes. Trachea midline, no masses. Cardiovascular: No clubbing, cyanosis, or edema. Respiratory: Normal respiratory effort, no increased work of breathing. GI: Abdomen is soft, non tender, non distended, no abdominal masses. Liver and spleen not palpable.  No hernias appreciated.  Stool sample for occult testing is not indicated.   GU: No CVA tenderness.  No bladder fullness or masses.  Patient with circumcised phallus.   Urethral meatus is patent.  No penile discharge. No penile lesions or rashes. Scrotum without lesions, cysts, rashes and/or edema.  Testicles are located scrotally bilaterally. No masses are appreciated in the testicles. Left and right epididymis are normal. Rectal: Patient with  normal sphincter tone. Anus and perineum without scarring or rashes. No rectal masses are appreciated. Prostate is approximately 60 grams (could not palpated entire gland due  to the size of the gland), no nodules are appreciated in the areas palpated.   Seminal vesicles could not be palpated. Skin: No rashes, bruises or suspicious lesions. Lymph: No cervical or inguinal  adenopathy. Neurologic: Grossly intact, no focal deficits, moving all 4 extremities. Psychiatric: Normal mood and affect.  Laboratory Data: Lab Results  Component Value Date   WBC 6.8 01/10/2016   HCT 38.7 01/10/2016   MCV 74 (L) 01/10/2016   PLT 230 01/10/2016    Lab Results  Component Value Date   CREATININE 1.17 05/08/2016  PSA History  4.43 ng/mL on 12/01/2013  4.67 ng/mL on 08/28/2014  4.80 ng/mL on 11/02/2015  6.10 ng/mL on 02/04/2016  5.10 ng/mL on 02/19/2016  4.70 ng.mL on 05/08/2016   Urinalysis Results for orders placed or performed in visit on 07/14/16  Microscopic Examination  Result Value Ref Range   WBC, UA 0-5 0 - 5 /hpf   RBC, UA 0-2 0 - 2 /hpf   Epithelial Cells (non renal) 0-10 0 - 10 /hpf   Bacteria, UA None seen None seen/Few  Urinalysis, Complete  Result Value Ref Range   Specific Gravity, UA 1.015 1.005 - 1.030   pH, UA 5.5 5.0 - 7.5   Color, UA Yellow Yellow   Appearance Ur Clear Clear   Leukocytes, UA Trace (A) Negative   Protein, UA Negative Negative/Trace   Glucose, UA Negative Negative   Ketones, UA Negative Negative   RBC, UA Negative Negative   Bilirubin, UA Negative Negative   Urobilinogen, Ur 0.2 0.2 - 1.0 mg/dL   Nitrite, UA Negative Negative   Microscopic Examination See below:   PSA  Result Value Ref Range   Prostate Specific Ag, Serum 5.6 (H) 0.0 - 4.0 ng/mL    Assessment & Plan:    1. History of gross hematuria:   Patient had a hematuria work up in 2013 that consisted of a CT Urogram, cystoscopy with bilateral retrogrades and ultimately a TURP after experiencing clot retention.  No malignancies were discovered.  He has not had further episodes of gross hematuria.  Today's UA is negative for hematuria.  Renal ultrasound completed on 02/14/2016 did not note any renal masses, urinary calculi or hydronephrosis.  We will continue to monitor with annual microscopic UA's.  He will report any episodes of gross hematuria.    -  Urinalysis, Complete  2. Erectile dysfunction:   SHIM score is 7.  Patient states his has been having difficulty with erections since starting his finasteride.  His wife have left him recently.  He will RTC in 6 months for a SHIM score and exam.  3. BPH (benign prostatic hyperplasia) with LUTS:    - IPSS score is 5/3, it is improving  - Continue conservative management, avoiding bladder irritants and timed voiding's  - Continue tamsulosin 0.4 mg daily and finasteride 5 mg daily  - RTC in 6 months for IPSS, PSA and exam   - PSA  4. Elevated PSA:   Recent PSA was 4.7 in 05/2016.  Repeated today.  If stable or reduced, he will follow up in 6 months for repeated PSA and exam.  Return in about 6 months (around 01/11/2017) for IPSS, SHIM, PSA and exam.  These notes generated with voice recognition software. I apologize for typographical errors.  Zara Council, PA-C  [facility] [address] [place], [place] [address] [phone]  Addendum:  Patient's PSA returned elevated at 5.6.  We double checked with the  patient and he was not taking his finasteride as he had indicated during his office visit.  He is asked to restart the finasteride and take it consistently.  He will RTC in one month for a repeat PSA.

## 2016-07-15 ENCOUNTER — Telehealth: Payer: Self-pay | Admitting: Family Medicine

## 2016-07-15 LAB — PSA: Prostate Specific Ag, Serum: 5.6 ng/mL — ABNORMAL HIGH (ref 0.0–4.0)

## 2016-07-15 NOTE — Telephone Encounter (Signed)
-----   Message from Nori Riis, PA-C sent at 07/15/2016  8:00 AM EDT ----- Patient's PSA has increased.  Is he taking his finasteride?

## 2016-07-15 NOTE — Telephone Encounter (Signed)
Left message on machine for patient to return call

## 2016-07-15 NOTE — Telephone Encounter (Signed)
Patient notified. He states he just started back on the Finasteride yesterday. He states he spoke to you about it yesterday and he will continue the medication until his next visit with you.

## 2016-07-17 NOTE — Telephone Encounter (Signed)
I need to have his PSA recheck after 30 days of taking the finasteride consistently.

## 2016-07-18 NOTE — Telephone Encounter (Signed)
Left message for patient to return call.

## 2016-07-23 ENCOUNTER — Telehealth: Payer: Self-pay | Admitting: Urology

## 2016-07-23 DIAGNOSIS — R972 Elevated prostate specific antigen [PSA]: Secondary | ICD-10-CM

## 2016-07-23 NOTE — Telephone Encounter (Signed)
Please call the patient and tell him he needs to take his finasteride and we will recheck his PSA in one month.

## 2016-07-25 NOTE — Telephone Encounter (Signed)
Spoke to pt and he said he has been taking it every night   michelle

## 2016-07-25 NOTE — Telephone Encounter (Signed)
LMOM

## 2016-07-26 NOTE — Telephone Encounter (Signed)
His PSA has not reduced with the addition of finasteride.  Would you add a free and total PSA to his blood work?

## 2016-07-29 NOTE — Telephone Encounter (Signed)
LMOM Pt needs to come back in for more lab work. [facility] threw out blood.

## 2016-07-30 NOTE — Telephone Encounter (Signed)
Spoke with pt in reference to PSA and needing more blood. Pt stated that he would return on Thursday or Friday. Orders placed.

## 2016-08-01 ENCOUNTER — Ambulatory Visit (INDEPENDENT_AMBULATORY_CARE_PROVIDER_SITE_OTHER): Payer: Medicaid Other | Admitting: Family Medicine

## 2016-08-01 ENCOUNTER — Other Ambulatory Visit: Payer: Medicaid Other

## 2016-08-01 ENCOUNTER — Encounter: Payer: Self-pay | Admitting: Family Medicine

## 2016-08-01 DIAGNOSIS — M75122 Complete rotator cuff tear or rupture of left shoulder, not specified as traumatic: Secondary | ICD-10-CM | POA: Diagnosis not present

## 2016-08-01 DIAGNOSIS — E559 Vitamin D deficiency, unspecified: Secondary | ICD-10-CM

## 2016-08-01 DIAGNOSIS — G8929 Other chronic pain: Secondary | ICD-10-CM

## 2016-08-01 DIAGNOSIS — R972 Elevated prostate specific antigen [PSA]: Secondary | ICD-10-CM

## 2016-08-01 DIAGNOSIS — M545 Low back pain: Secondary | ICD-10-CM | POA: Diagnosis not present

## 2016-08-01 DIAGNOSIS — S90822D Blister (nonthermal), left foot, subsequent encounter: Secondary | ICD-10-CM

## 2016-08-01 NOTE — Patient Instructions (Addendum)
Check on referral up front with [place] Do take vitamin D3 one thousand international units (1000 iu) once a day Take vitamin B12 500 or 1000 mcg once a day I've put in a referral to the orthopaedist about your back and shoulder

## 2016-08-01 NOTE — Assessment & Plan Note (Signed)
Check on referral to dermatologist that was made in July

## 2016-08-01 NOTE — Progress Notes (Signed)
BP 110/74 (BP Location: Right Arm, Patient Position: Sitting, Cuff Size: Large)   Pulse 65   Temp 97.8 F (36.6 C) (Oral)   Resp 18   Wt 160 lb 3 oz (72.7 kg)   SpO2 99%   BMI 22.98 kg/m    Subjective:    Patient ID: Manuel [name], male    DOB: [birth year], 60 y.o.   MRN: [id number]  HPI: Manuel [name] is a 60 y.o. male  Chief Complaint  Patient presents with  . Medication Refill   Patient is here for follow-up and medication refill; he is here with his wife today He was going to go to dermatologist for rash on the foot, but did not get appt He has chronic left shoulder pain; limited ROM; he has had the right shoulder repaired, but the left is ongoing problem and source of pain and limitation ---------------------------------------- August 06, 2015 ordered by Nikki Dom, MD CLINICAL DATA:  60 year old male with bilateral shoulder pain. Initial encounter.  EXAM: LEFT SHOULDER - 2+ VIEW  COMPARISON:  None.  FINDINGS: No glenohumeral joint dislocation. Proximal left humerus intact. Left scapula and clavicle appear intact. Moderate degenerative changes at the left acromioclavicular joint. There is mild inferior glenoid spurring and subchondral sclerosis. Negative visualized left ribs and lung parenchyma.  IMPRESSION: Mild-to-moderate degenerative changes. No acute osseous abnormality identified about the left shoulder.  Electronically Signed   By: Genevie Ann M.D.   On: 08/06/2015 15:27 -------------------------------------------------- EXAM: RIGHT SHOULDER - 2+ VIEW  COMPARISON:  None.  FINDINGS: No acute bony or joint abnormality. Glenohumeral acromioclavicular degenerative change present. No evidence fracture or dislocation.  IMPRESSION: 1. No acute abnormality. 2. Acromioclavicular and glenohumeral degenerative change.  Electronically Signed   By: Marcello Moores  Register   On: 08/06/2015 15:43 ------------------------------- He was going to  the spine center in [place]; he had an MRI over a year ago; pain in the lower back  Hx of vit D deficiency, up from 12.7 to 34 on last check; his mood is doing much better  Depression screen [facility] 2/9 08/01/2016 06/02/2016 06/02/2016 02/19/2016 12/06/2015  Decreased Interest 0 1 0 0 1  Down, Depressed, Hopeless 0 3 1 1 1   PHQ - 2 Score 0 4 1 1 2   Altered sleeping - 3 - - 3  Tired, decreased energy - 3 - - 3  Change in appetite - 1 - - 0  Feeling bad or failure about yourself  - 1 - - 1  Trouble concentrating - 1 - - 1  Moving slowly or fidgety/restless - 1 - - 0  Suicidal thoughts - 0 - - 1  PHQ-9 Score - 14 - - 11  Difficult doing work/chores - Very difficult - - Somewhat difficult   Relevant past medical, surgical, family and social history reviewed Past Medical History:  Diagnosis Date  . Anemia   . Anxiety   . Asthma   . Bilateral carpal tunnel syndrome   . Bilateral hand numbness   . BPH without urinary obstruction   . Cervical spondylosis without myelopathy   . Complete tear of rotator cuff    bilateral  . COPD (chronic obstructive pulmonary disease) ([place])   . Depression   . History of stomach ulcers   . HLD (hyperlipidemia)   . LVH (left ventricular hypertrophy)   . Personal history of tobacco use, presenting hazards to health 12/21/2015  . Poor dentition   . Sickle cell trait ([place])   . Sleep apnea   .  Smoker    Past Surgical History:  Procedure Laterality Date  . COLONOSCOPY WITH PROPOFOL N/A 12/25/2015   Procedure: COLONOSCOPY WITH PROPOFOL;  Surgeon: Lucilla Lame, MD;  Location: [facility] ENDOSCOPY;  Service: Endoscopy;  Laterality: N/A;  . CYSTOURETHROSCOPY  08/25/12   with ureteral cathization w/wo retrograde pyelogram  . ROTATOR CUFF REPAIR Right 01/25/15  . SHOULDER ARTHROSCOPY WITH SUBACROMIAL DECOMPRESSION Right 032416  . TRANSURETHRAL RESECTION OF PROSTATE  08/25/12   Family History  Problem Relation Age of Onset  . Aneurysm Mother   . Hypertension Mother   .  Diabetes Father   . Hypertension Father   . Prostate cancer Father   . Cancer Maternal Aunt   . Cancer Maternal Uncle   . Diabetes Paternal Uncle   . Heart disease Cousin   . Prostate cancer Paternal Uncle   . COPD Neg Hx   . Stroke Neg Hx   . Kidney disease Neg Hx    Social History  Substance Use Topics  . Smoking status: Current Every Day Smoker    Packs/day: 0.50    Years: 35.00    Types: Cigarettes  . Smokeless tobacco: Never Used  . Alcohol use No   Interim medical history since last visit reviewed. Allergies and medications reviewed  Review of Systems Per HPI unless specifically indicated above     Objective:    BP 110/74 (BP Location: Right Arm, Patient Position: Sitting, Cuff Size: Large)   Pulse 65   Temp 97.8 F (36.6 C) (Oral)   Resp 18   Wt 160 lb 3 oz (72.7 kg)   SpO2 99%   BMI 22.98 kg/m   Wt Readings from Last 3 Encounters:  08/01/16 160 lb 3 oz (72.7 kg)  07/14/16 161 lb 14.4 oz (73.4 kg)  06/02/16 162 lb (73.5 kg)    Physical Exam  Constitutional: He appears well-developed and well-nourished. No distress.  HENT:  Mouth/Throat: He has dentures.  Cardiovascular: Normal rate and regular rhythm.   Pulmonary/Chest: Effort normal and breath sounds normal.  Musculoskeletal:       Left shoulder: He exhibits decreased range of motion, tenderness and pain. He exhibits no deformity and normal strength.       Lumbar back: He exhibits decreased range of motion and tenderness. He exhibits no deformity.  Pain with rotation at the waist and then extension  Psychiatric: His mood appears not anxious. He does not exhibit a depressed mood.      Assessment & Plan:   Problem List Items Addressed This Visit      Musculoskeletal and Integument   Friction blisters of sole of left foot    Check on referral to dermatologist that was made in July      Complete rotator cuff rupture of left shoulder    Refer to orthopaedist      Relevant Orders   Ambulatory  referral to Orthopedic Surgery     Other   Vitamin D deficiency    Much improved after prescription vitamin D, back to normal range; improvement in mood as well      Chronic lower back pain    Refer to orthopaedist      Relevant Orders   Ambulatory referral to Orthopedic Surgery    Other Visit Diagnoses   None.     Follow up plan: Return in about 3 months (around 10/31/2016).  An after-visit summary was printed and given to the patient at [facility].  Please see the patient instructions which may contain  other information and recommendations beyond what is mentioned above in the assessment and plan.  Orders Placed This Encounter  Procedures  . Ambulatory referral to Orthopedic Surgery

## 2016-08-01 NOTE — Assessment & Plan Note (Signed)
Refer to orthopaedist 

## 2016-08-02 LAB — PSA, TOTAL AND FREE
PSA FREE PCT: 12.7 %
PSA FREE: 0.52 ng/mL
Prostate Specific Ag, Serum: 4.1 ng/mL — ABNORMAL HIGH (ref 0.0–4.0)

## 2016-08-03 NOTE — Assessment & Plan Note (Signed)
Much improved after prescription vitamin D, back to normal range; improvement in mood as well

## 2016-08-04 ENCOUNTER — Telehealth: Payer: Self-pay

## 2016-08-04 NOTE — Telephone Encounter (Signed)
LMOM

## 2016-08-04 NOTE — Telephone Encounter (Signed)
-----   Message from Nori Riis, PA-C sent at 08/02/2016 12:36 PM EDT ----- Patient's free and total PSA indicates that he has a 24% probability of having prostate cancer.  Most urologist suggest undergoing a biopsy when the probability reaches 25% or higher.  He is very close to this percentage.  He may choose to undergo a biopsy at this time or choose to follow his PSA more closely.  If he chooses to follow the PSA more closely, I suggest PSA's and DRE's every four months for the next year.

## 2016-08-05 NOTE — Telephone Encounter (Signed)
LMOM

## 2016-08-06 NOTE — Telephone Encounter (Signed)
LMOM- will send a certified letter.

## 2016-08-07 ENCOUNTER — Telehealth: Payer: Self-pay

## 2016-08-07 NOTE — Telephone Encounter (Signed)
Certified letter sent. Tracking number [id number]

## 2016-08-12 ENCOUNTER — Telehealth: Payer: Self-pay

## 2016-08-12 NOTE — Telephone Encounter (Signed)
Pt called in reference to certified letter sent. Pt stated that he had several questions and is concerned about the results. Pt requested an appt with a provider. Pt was transferred to the front to make appt.

## 2016-08-19 ENCOUNTER — Encounter: Payer: Self-pay | Admitting: Urology

## 2016-08-19 ENCOUNTER — Ambulatory Visit (INDEPENDENT_AMBULATORY_CARE_PROVIDER_SITE_OTHER): Payer: Medicaid Other | Admitting: Urology

## 2016-08-19 VITALS — BP 117/74 | HR 69 | Ht 70.0 in | Wt 160.7 lb

## 2016-08-19 DIAGNOSIS — R972 Elevated prostate specific antigen [PSA]: Secondary | ICD-10-CM | POA: Diagnosis not present

## 2016-08-19 NOTE — Progress Notes (Signed)
2:48 PM   Manuel [name] [birth year] BP:[id number]  Referring provider: Arnetha Courser, MD [address] [place],  [address]  Chief Complaint  Patient presents with  . Follow-up    HPI: Patient is a 60 year old African-American male with presents with his sister and girlfriend to discuss recent PSA results. a history of gross hematuria, erectile dysfunction and BPH with LUTS who presents today for a 6 months follow up.    Elevated PSA Patient's PSA has been variable over the last two years ranging from 4 to 6.  He has been inconsistent in taking his finasteride due to erectile dysfunction.  Because of this, it is difficult to be sure what his true PSA value is at this time.  His free and total PSA noted a 24% probability of having prostate cancer.  PCPT risk notes a 25% chance of a high grade cancer, 32% low grade cancer and a 43% chance of no cancer found on biopsy.  His father and paternal grandfather had been diagnosed with prostate cancer.    History of gross hematuria Patient had an episode of clot retention in 2013.  He underwent a CT Urogram, cystoscopy with bilateral retrogrades and ultimately a TURP with Dr. Darcus Austin at [facility].  No malignancies were discovered.  Hematuria was from his enlarged, friable prostate.  He has not had any gross hematuria since that time.  A too small to characterize lesion was seen in the left midpole of the kidney on the CT Urogram in 2013.  Renal ultrasound completed on 02/14/2016 noted no renal masses, no hydronephrosis and an enlarged prostate.  His UA today at his last visit was unremarkable.  Erectile dysfunction His SHIM score is 7, which is severe erectile dysfunction.   His previous SHIM score was 20.  He has been having difficulty with erections since starting the finasteride.   His major complaint is now having the confidence to achieve an erections.  His libido is preserved.   His risk factors for ED are hyperlipidemia,  depression, age, BPH and smoking.  He denies any painful erections or curvatures with his erections.   He states that his wife has bi-polar disease and has left him at this time.       SHIM    Row Name 07/14/16 1359         SHIM: Over the last 6 months:   How do you rate your confidence that you could get and keep an erection? Very Low     When you had erections with sexual stimulation, how often were your erections hard enough for penetration (entering your partner)? A Few Times (much less than half the time)     During sexual intercourse, how often were you able to maintain your erection after you had penetrated (entered) your partner? Very Difficult     During sexual intercourse, how difficult was it to maintain your erection to completion of intercourse? Extremely Difficult     When you attempted sexual intercourse, how often was it satisfactory for you? Extremely Difficult       SHIM Total Score   SHIM 7        Score: 1-7 Severe ED 8-11 Moderate ED 12-16 Mild-Moderate ED 17-21 Mild ED 22-25 No ED   BPH WITH LUTS His IPSS score today is 5, which is mild lower urinary tract symptomatology. He is mixed with his quality life due to his urinary symptoms.  His previous PVR was 22  mL. His previous I PSS score was 15/4.  His major complaint today a weak urinary stream.  He has had these symptoms for over three years.  He doesn't do that he does not drink a lot of fluids during the day.   He has been taking his tamsulosin and finasteride daily.   He denies any dysuria, hematuria or suprapubic pain.  His has had a TURP.  He also denies any recent fevers, chills, nausea or vomiting.   He has a family history of PCa, with father and paternal grand father with prostate cancer.         IPSS    Row Name 07/14/16 1300         International Prostate Symptom Score   How often have you had the sensation of not emptying your bladder? Less than 1 in 5     How often have you had to urinate  less than every two hours? Less than 1 in 5 times     How often have you found you stopped and started again several times when you urinated? Less than 1 in 5 times     How often have you found it difficult to postpone urination? Less than 1 in 5 times     How often have you had a weak urinary stream? Not at All     How often have you had to strain to start urination? Not at All     How many times did you typically get up at night to urinate? 1 Time     Total IPSS Score 5       Quality of Life due to urinary symptoms   If you were to spend the rest of your life with your urinary condition just the way it is now how would you feel about that? Mixed        Score:  1-7 Mild 8-19 Moderate 20-35 Severe   PMH: Past Medical History:  Diagnosis Date  . Anemia   . Anxiety   . Asthma   . Bilateral carpal tunnel syndrome   . Bilateral hand numbness   . BPH without urinary obstruction   . Cervical spondylosis without myelopathy   . Complete tear of rotator cuff    bilateral  . COPD (chronic obstructive pulmonary disease) ([place])   . Depression   . History of stomach ulcers   . HLD (hyperlipidemia)   . LVH (left ventricular hypertrophy)   . Personal history of tobacco use, presenting hazards to health 12/21/2015  . Poor dentition   . Sickle cell trait ([place])   . Sleep apnea   . Smoker     Surgical History: Past Surgical History:  Procedure Laterality Date  . COLONOSCOPY WITH PROPOFOL N/A 12/25/2015   Procedure: COLONOSCOPY WITH PROPOFOL;  Surgeon: Lucilla Lame, MD;  Location: [facility] END[facility];  Service: Endoscopy;  Laterality: N/A;  . CYSTOURETHROSCOPY  08/25/12   with ureteral cathization w/wo retrograde pyelogram  . ROTATOR CUFF REPAIR Right 01/25/15  . SHOULDER ARTHROSCOPY WITH SUBACROMIAL DECOMPRESSION Right 032416  . TRANSURETHRAL RESECTION OF PROSTATE  08/25/12    Home Medications:    Medication List       Accurate as of 08/19/16  2:48 PM. Always use your most recent med  list.          aspirin EC 81 MG tablet Take 1 tablet (81 mg total) by mouth daily.   atorvastatin 10 MG tablet Commonly known as:  LIPITOR Take 1 tablet (10  mg total) by mouth at bedtime.   buPROPion 150 MG 24 hr tablet Commonly known as:  WELLBUTRIN XL Take 1 tablet (150 mg total) by mouth daily.   cyanocobalamin 500 MCG tablet Take 1 tablet (500 mcg total) by mouth daily.   diclofenac 75 MG EC tablet Commonly known as:  VOLTAREN Take 1 tablet (75 mg total) by mouth 2 (two) times daily.   escitalopram 10 MG tablet Commonly known as:  LEXAPRO Take 1.5 tablets (15 mg total) by mouth daily.   finasteride 5 MG tablet Commonly known as:  PROSCAR Take 1 tablet (5 mg total) by mouth daily.   HYDROcodone-acetaminophen 5-325 MG tablet Commonly known as:  NORCO/VICODIN Take 1 tablet by mouth as needed.   meloxicam 15 MG tablet Commonly known as:  MOBIC   omeprazole 20 MG capsule Commonly known as:  PRILOSEC Take 20 mg by mouth daily. Reported on 02/19/2016   promethazine 25 MG tablet Commonly known as:  PHENERGAN Take 25 mg by mouth every 6 (six) hours as needed for nausea or vomiting. Reported on 02/19/2016   tamsulosin 0.4 MG Caps capsule Commonly known as:  FLOMAX Take 1 capsule (0.4 mg total) by mouth daily.       Allergies:  Allergies  Allergen Reactions  . Penicillin G Other (See Comments)    Family History: Family History  Problem Relation Age of Onset  . Aneurysm Mother   . Hypertension Mother   . Diabetes Father   . Hypertension Father   . Prostate cancer Father   . Cancer Maternal Aunt   . Cancer Maternal Uncle   . Diabetes Paternal Uncle   . Heart disease Cousin   . Prostate cancer Paternal Uncle   . COPD Neg Hx   . Stroke Neg Hx   . Kidney disease Neg Hx     Social History:  reports that he has been smoking Cigarettes.  He has a 17.50 pack-year smoking history. He has never used smokeless tobacco. He reports that he does not drink alcohol  or use drugs.  ROS: UROLOGY Frequent Urination?: No Hard to postpone urination?: No Burning/pain with urination?: No Get up at night to urinate?: No Leakage of urine?: No Urine stream starts and stops?: No Trouble starting stream?: No Do you have to strain to urinate?: No Blood in urine?: No Urinary tract infection?: No Sexually transmitted disease?: No Injury to kidneys or bladder?: No Painful intercourse?: No Weak stream?: No Erection problems?: No Penile pain?: No  Gastrointestinal Nausea?: No Vomiting?: No Indigestion/heartburn?: No Diarrhea?: No Constipation?: No  Constitutional Fever: No Night sweats?: No Weight loss?: No Fatigue?: No  Skin Skin rash/lesions?: No Itching?: No  Eyes Blurred vision?: Yes Double vision?: Yes  Ears/Nose/Throat Sore throat?: No Sinus problems?: No  Hematologic/Lymphatic Swollen glands?: No Easy bruising?: No  Cardiovascular Leg swelling?: Yes Chest pain?: No  Respiratory Cough?: No Shortness of breath?: No  Endocrine Excessive thirst?: No  Musculoskeletal Back pain?: Yes Joint pain?: Yes  Neurological Headaches?: Yes Dizziness?: No  Psychologic Depression?: Yes Anxiety?: Yes  Physical Exam: BP 117/74 (BP Location: Right Arm, Patient Position: Sitting, Cuff Size: Normal)   Pulse 69   Ht 5\' 10"  (1.778 m)   Wt 160 lb 11.2 oz (72.9 kg)   BMI 23.06 kg/m   Constitutional: Well nourished. Alert and oriented, No acute distress. HEENT: [place] AT, moist mucus membranes. Trachea midline, no masses. Cardiovascular: No clubbing, cyanosis, or edema. Respiratory: Normal respiratory effort, no increased work of breathing.  Skin: No rashes, bruises or suspicious lesions. Lymph: No cervical or inguinal adenopathy. Neurologic: Grossly intact, no focal deficits, moving all 4 extremities. Psychiatric: Normal mood and affect.  Laboratory Data: Lab Results  Component Value Date   WBC 6.8 01/10/2016   HCT 38.7  01/10/2016   MCV 74 (L) 01/10/2016   PLT 230 01/10/2016    Lab Results  Component Value Date   CREATININE 1.17 05/08/2016  PSA History  4.43 ng/mL on 12/01/2013  4.67 ng/mL on 08/28/2014  4.80 ng/mL on 11/02/2015  6.10 ng/mL on 02/04/2016  5.10 ng/mL on 02/19/2016  4.70 ng.mL on 05/08/2016   Urinalysis Results for orders placed or performed in visit on 08/01/16  PSA, total and free  Result Value Ref Range   Prostate Specific Ag, Serum 4.1 (H) 0.0 - 4.0 ng/mL   PSA, Free 0.52 N/A ng/mL   PSA, Free Pct 12.7 %    Assessment & Plan:    1.  Elevated PSA  - I offered to repeat the PSA again at today's visit, but he and his girlfriend had intercourse last evening  - I discussed the free and total PSA results and the PCPT Risk calculations with the patient, girlfriend and sister - the sister has encouraged the patient to undergo a prostate biopsy at this time - he agrees  - Patient will be schedule for a TRUSPBx of prostate.  The procedure is explained and the risks involved, such as blood in urine, blood in stool, blood in semen, infection, urinary retention, and on rare occasions sepsis and death in 2% of individuals undergoing a biopsy. Patient understands the risks as explained to him and he wishes to proceed.    - He is instructed to discontinue the ASA, meloxicam and diclofenac ten days prior to the biopsy  2. History of gross hematuria  - Not addressed at this visit  3. Erectile dysfunction  - Not addressed at this visit  4. BPH (benign prostatic hyperplasia) with LUTS  - Not addressed at this visit   Return for schedule TRUSPBx of prostate.  These notes generated with voice recognition software. I apologize for typographical errors.  Zara Council, [facility] [address], [place], [place] [address] [phone]

## 2016-09-22 ENCOUNTER — Other Ambulatory Visit: Payer: Self-pay | Admitting: Urology

## 2016-09-22 ENCOUNTER — Other Ambulatory Visit: Payer: Medicaid Other | Admitting: Urology

## 2016-09-22 ENCOUNTER — Encounter: Payer: Self-pay | Admitting: Urology

## 2016-09-22 ENCOUNTER — Telehealth: Payer: Self-pay

## 2016-09-22 NOTE — Telephone Encounter (Signed)
Pt called flustered about not "getting his prep kit" over the weekend for prostate bx. Reinforced with pt he is having a prostate bx today and will need to get a fleets enema and administer it by 1:30. Pt stated that hes just flustered and doesn't understanding. Pt then stated that he would call back about if he will keep appt.

## 2016-09-29 ENCOUNTER — Ambulatory Visit: Payer: Medicaid Other

## 2016-10-07 ENCOUNTER — Ambulatory Visit (INDEPENDENT_AMBULATORY_CARE_PROVIDER_SITE_OTHER): Payer: Medicaid Other | Admitting: Urology

## 2016-10-07 ENCOUNTER — Encounter: Payer: Self-pay | Admitting: Urology

## 2016-10-07 VITALS — BP 124/83 | HR 64 | Ht 70.0 in | Wt 157.4 lb

## 2016-10-07 DIAGNOSIS — R972 Elevated prostate specific antigen [PSA]: Secondary | ICD-10-CM

## 2016-10-07 NOTE — Progress Notes (Signed)
Prostate Biopsy Procedure   Informed consent was obtained after discussing risks/benefits of the procedure.  A time out was performed to ensure correct patient identity.  Pre-Procedure: - Last PSA Level: No results found for: PSA - Gentamicin given prophylactically - Levaquin 500 mg administered PO -Transrectal Ultrasound performed revealing a 51.7 gm prostate -No significant hypoechoic or median lobe noted  Procedure: - Prostate block performed using 10 cc 1% lidocaine and biopsies taken from sextant areas, a total of 12 under ultrasound guidance.  Post-Procedure: - Patient tolerated the procedure well - He was counseled to seek immediate medical attention if experiences any severe pain, significant bleeding, or fevers - Return in one week to discuss biopsy results

## 2016-10-09 LAB — PATHOLOGY REPORT

## 2016-10-13 ENCOUNTER — Other Ambulatory Visit: Payer: Self-pay | Admitting: Urology

## 2016-10-13 ENCOUNTER — Ambulatory Visit: Payer: Medicaid Other | Admitting: Urology

## 2016-10-31 ENCOUNTER — Encounter: Payer: Self-pay | Admitting: Family Medicine

## 2016-10-31 ENCOUNTER — Ambulatory Visit (INDEPENDENT_AMBULATORY_CARE_PROVIDER_SITE_OTHER): Payer: Medicaid Other | Admitting: Family Medicine

## 2016-10-31 VITALS — BP 122/80 | HR 71 | Temp 98.0°F | Resp 14 | Wt 165.0 lb

## 2016-10-31 DIAGNOSIS — S90822D Blister (nonthermal), left foot, subsequent encounter: Secondary | ICD-10-CM

## 2016-10-31 DIAGNOSIS — E782 Mixed hyperlipidemia: Secondary | ICD-10-CM | POA: Diagnosis not present

## 2016-10-31 DIAGNOSIS — Z5181 Encounter for therapeutic drug level monitoring: Secondary | ICD-10-CM

## 2016-10-31 DIAGNOSIS — G8929 Other chronic pain: Secondary | ICD-10-CM

## 2016-10-31 DIAGNOSIS — M545 Low back pain, unspecified: Secondary | ICD-10-CM

## 2016-10-31 DIAGNOSIS — Z1159 Encounter for screening for other viral diseases: Secondary | ICD-10-CM | POA: Diagnosis not present

## 2016-10-31 DIAGNOSIS — E559 Vitamin D deficiency, unspecified: Secondary | ICD-10-CM

## 2016-10-31 DIAGNOSIS — N182 Chronic kidney disease, stage 2 (mild): Secondary | ICD-10-CM

## 2016-10-31 DIAGNOSIS — R972 Elevated prostate specific antigen [PSA]: Secondary | ICD-10-CM

## 2016-10-31 DIAGNOSIS — F17200 Nicotine dependence, unspecified, uncomplicated: Secondary | ICD-10-CM

## 2016-10-31 DIAGNOSIS — Z23 Encounter for immunization: Secondary | ICD-10-CM

## 2016-10-31 LAB — COMPLETE METABOLIC PANEL WITH GFR
ALBUMIN: 3.8 g/dL (ref 3.6–5.1)
ALK PHOS: 70 U/L (ref 40–115)
ALT: 13 U/L (ref 9–46)
AST: 21 U/L (ref 10–35)
BUN: 9 mg/dL (ref 7–25)
CALCIUM: 8.9 mg/dL (ref 8.6–10.3)
CO2: 31 mmol/L (ref 20–31)
Chloride: 101 mmol/L (ref 98–110)
Creat: 1.36 mg/dL — ABNORMAL HIGH (ref 0.70–1.33)
GFR, EST AFRICAN AMERICAN: 66 mL/min (ref >=60)
GFR, EST NON AFRICAN AMERICAN: 57 mL/min — AB (ref >=60)
Glucose, Bld: 84 mg/dL (ref 65–99)
POTASSIUM: 4.8 mmol/L (ref 3.5–5.3)
Sodium: 140 mmol/L (ref 135–146)
Total Bilirubin: 0.3 mg/dL (ref 0.2–1.2)
Total Protein: 6.6 g/dL (ref 6.1–8.1)

## 2016-10-31 LAB — CBC WITH DIFFERENTIAL/PLATELET
BASOS ABS: 0 {cells}/uL (ref 0–200)
Basophils Relative: 0 %
Eosinophils Absolute: 116 cells/uL (ref 15–500)
Eosinophils Relative: 2 %
HEMATOCRIT: 40.7 % (ref 38.5–50.0)
HEMOGLOBIN: 13.3 g/dL (ref 13.2–17.1)
LYMPHS ABS: 2204 {cells}/uL (ref 850–3900)
LYMPHS PCT: 38 %
MCH: 24.6 pg — AB (ref 27.0–33.0)
MCHC: 32.7 g/dL (ref 32.0–36.0)
MCV: 75.4 fL — ABNORMAL LOW (ref 80.0–100.0)
MONO ABS: 464 {cells}/uL (ref 200–950)
MPV: 9.1 fL (ref 7.5–12.5)
Monocytes Relative: 8 %
NEUTROS PCT: 52 %
Neutro Abs: 3016 cells/uL (ref 1500–7800)
Platelets: 240 10*3/uL (ref 140–400)
RBC: 5.4 MIL/uL (ref 4.20–5.80)
RDW: 15.5 % — ABNORMAL HIGH (ref 11.0–15.0)
WBC: 5.8 10*3/uL (ref 3.8–10.8)

## 2016-10-31 LAB — LIPID PANEL
CHOL/HDL RATIO: 4.5 ratio (ref <5.0)
Cholesterol: 213 mg/dL — ABNORMAL HIGH (ref <200)
HDL: 47 mg/dL (ref >40)
LDL CALC: 134 mg/dL — AB (ref <100)
TRIGLYCERIDES: 160 mg/dL — AB (ref <150)
VLDL: 32 mg/dL — ABNORMAL HIGH (ref <30)

## 2016-10-31 MED ORDER — BUPROPION HCL ER (XL) 150 MG PO TB24: 150.0000 mg | ORAL_TABLET | Freq: Every day | ORAL | 5 refills | Status: DC

## 2016-10-31 NOTE — Assessment & Plan Note (Signed)
Refer to podiatrist 

## 2016-10-31 NOTE — Assessment & Plan Note (Signed)
Refer back to [employer]

## 2016-10-31 NOTE — Progress Notes (Signed)
BP 122/80   Pulse 71   Temp 98 F (36.7 C) (Oral)   Resp 14   Wt 165 lb (74.8 kg)   SpO2 98%   BMI 23.68 kg/m    Subjective:    Patient ID: Manuel [name], male    DOB: 02-06-1956, 60 y.o.   MRN: [id number]  HPI: Manuel [name] is a 60 y.o. male  Chief Complaint  Patient presents with  . Follow-up   He has had a prostate biopsy just recently; no cancer he says He has depression and is taking 15 mg of lexapro and working well Smoking just 1/2 ppd; no smoking during the night; barely smokes at work Taking statin for cholesterol; needs refill; (however, refill history suggests that he would have run out many months ago); maybe eats eggs every now and then; used to eat them every day, but cut back Ate lunch, but just nibbles today Need to see someone about both feet with his blisters; was not able to make the appointment He did see Reche Dixon about his back but does not remember what he said; wonders if new MRI needed; trouble lifting heavy things; every now and then has pain shooting down in the legs He held the aspirin while having the prostate biopsy and will ask urologist to see if and when okay to start back on  aspirin  Depression screen [facility] 2/9 10/31/2016 08/01/2016 06/02/2016 06/02/2016 02/19/2016  Decreased Interest 0 0 1 0 0  Down, Depressed, Hopeless 0 0 3 1 1   PHQ - 2 Score 0 0 4 1 1   Altered sleeping - - 3 - -  Tired, decreased energy - - 3 - -  Change in appetite - - 1 - -  Feeling bad or failure about yourself  - - 1 - -  Trouble concentrating - - 1 - -  Moving slowly or fidgety/restless - - 1 - -  Suicidal thoughts - - 0 - -  PHQ-9 Score - - 14 - -  Difficult doing work/chores - - Very difficult - -   Relevant past medical, surgical, family and social history reviewed Past Medical History:  Diagnosis Date  . Anemia   . Anxiety   . Asthma   . Bilateral carpal tunnel syndrome   . Bilateral hand numbness   . BPH without urinary obstruction   . Cervical  spondylosis without myelopathy   . Complete tear of rotator cuff    bilateral  . COPD (chronic obstructive pulmonary disease) ([place])   . Depression   . History of stomach ulcers   . HLD (hyperlipidemia)   . LVH (left ventricular hypertrophy)   . Personal history of tobacco use, presenting hazards to health 12/21/2015  . Poor dentition   . Sickle cell trait ([place])   . Sleep apnea   . Smoker    Past Surgical History:  Procedure Laterality Date  . COLONOSCOPY WITH PROPOFOL N/A 12/25/2015   Procedure: COLONOSCOPY WITH PROPOFOL;  Surgeon: Lucilla Lame, MD;  Location: [facility] END[facility];  Service: Endoscopy;  Laterality: N/A;  . CYSTOURETHROSCOPY  08/25/12   with ureteral cathization w/wo retrograde pyelogram  . ROTATOR CUFF REPAIR Right 01/25/15  . SHOULDER ARTHROSCOPY WITH SUBACROMIAL DECOMPRESSION Right 032416  . TRANSURETHRAL RESECTION OF PROSTATE  08/25/12   Family History  Problem Relation Age of Onset  . Aneurysm Mother   . Hypertension Mother   . Diabetes Father   . Hypertension Father   . Prostate cancer Father   .  Cancer Maternal Aunt   . Cancer Maternal Uncle   . Diabetes Paternal Uncle   . Heart disease Cousin   . Prostate cancer Paternal Uncle   . COPD Neg Hx   . Stroke Neg Hx   . Kidney disease Neg Hx    Social History  Substance Use Topics  . Smoking status: Current Every Day Smoker    Packs/day: 0.50    Years: 35.00    Types: Cigarettes  . Smokeless tobacco: Never Used  . Alcohol use No  MD note: discussed cessation and he is interested in [place]  Interim medical history since last visit reviewed. Allergies and medications reviewed  Review of Systems Per HPI unless specifically indicated above     Objective:    BP 122/80   Pulse 71   Temp 98 F (36.7 C) (Oral)   Resp 14   Wt 165 lb (74.8 kg)   SpO2 98%   BMI 23.68 kg/m   Wt Readings from Last 3 Encounters:  10/31/16 165 lb (74.8 kg)  10/07/16 157 lb 6.4 oz (71.4 kg)  08/19/16 160 lb 11.2 oz  (72.9 kg)    Physical Exam  Constitutional: He appears well-developed and well-nourished. No distress.  HENT:  Mouth/Throat: Mucous membranes are normal. He has dentures.  Eyes: No scleral icterus.  Cardiovascular: Normal rate and regular rhythm.   Pulmonary/Chest: Effort normal and breath sounds normal.  Abdominal: Soft. He exhibits no distension.  Neurological: He is alert. He displays no tremor. Gait normal.  Skin: Skin is warm. No pallor.  Psychiatric: He has a normal mood and affect. His behavior is normal. Judgment and thought content normal.      Assessment & Plan:   Problem List Items Addressed This Visit      Musculoskeletal and Integument   Friction blisters of sole of left foot    Refer to podiatrist      Relevant Orders   Ambulatory referral to Podiatry     Genitourinary   Chronic kidney disease (CKD), stage II (mild)    Monitor; avoid NSAIDs; I removed naproxen and diclofenac from med list        Other   Vitamin D deficiency    Take 1,000 iu daily OTC; reviewed last two lab results with him      Need for hepatitis C screening test    Discussed one-time hep C screening recommendation for individuals born between 1945-1965 per USPSTF guidelines; patient agrees with testing; Hep C Ab ordered       Relevant Orders   Hepatitis C Antibody (Completed)   Medication monitoring encounter    Check sgpt      Relevant Orders   CBC with Differential/Platelet (Completed)   COMPLETE METABOLIC PANEL WITH GFR (Completed)   Hyperlipidemia - Primary    Limit eggs, meat, cheese, check lipids; start back on statin      Relevant Medications   atorvastatin (LIPITOR) 10 MG tablet   Other Relevant Orders   Lipid panel (Completed)   Elevated PSA    Managed by urologist      Current smoker    Urged smoking cessation; spent 3 minutes discussing smoking cessation; he does not sound like he needs nicotine replacement; bupropion should also help; refilled that as well        Chronic lower back pain    Refer back to Reche Dixon      Relevant Orders   Ambulatory referral to Orthopedic Surgery    Other Visit  Diagnoses    Needs flu shot       Relevant Orders   Flu Vaccine QUAD 36+ mos PF IM (Fluarix & Fluzone Quad PF) (Completed)      Follow up plan: Return in about 3 months (around 01/29/2017) for follow-up.  An after-visit summary was printed and given to the patient at [place].  Please see the patient instructions which may contain other information and recommendations beyond what is mentioned above in the assessment and plan.  Meds ordered this encounter  Medications  . buPROPion (WELLBUTRIN XL) 150 MG 24 hr tablet    Sig: Take 1 tablet (150 mg total) by mouth daily.    Dispense:  30 tablet    Refill:  5  . atorvastatin (LIPITOR) 10 MG tablet    Sig: Take 1 tablet (10 mg total) by mouth at bedtime.    Dispense:  30 tablet    Refill:  5    Orders Placed This Encounter  Procedures  . Flu Vaccine QUAD 36+ mos PF IM (Fluarix & Fluzone Quad PF)  . Hepatitis C Antibody  . Lipid panel  . CBC with Differential/Platelet  . COMPLETE METABOLIC PANEL WITH GFR  . Ambulatory referral to Podiatry  . Ambulatory referral to Orthopedic Surgery

## 2016-10-31 NOTE — Assessment & Plan Note (Signed)
Monitor; avoid NSAIDs; I removed naproxen and diclofenac from med list

## 2016-10-31 NOTE — Patient Instructions (Signed)
Try to limit saturated fats in your diet (bologna, hot dogs, barbeque, cheeseburgers, hamburgers, steak, bacon, sausage, cheese, etc.) and get more fresh fruits, vegetables, and whole grains I do encourage you to quit smoking Call [phone] to sign up for smoking cessation classes You can call 1-800-QUIT-NOW to talk with a smoking cessation coach We'll get labs and contact you about the results I've put in referrals to the back doctor and the foot doctor If you have not heard anything from my staff in a week about any orders/referrals/studies from today, please contact us here to follow-up [phone]

## 2016-10-31 NOTE — Assessment & Plan Note (Addendum)
Limit eggs, meat, cheese, check lipids; start back on statin

## 2016-10-31 NOTE — Assessment & Plan Note (Signed)
Discussed one-time hep C screening recommendation for individuals born between 1945-1965 per USPSTF guidelines; patient agrees with testing; Hep C Ab ordered 

## 2016-10-31 NOTE — Assessment & Plan Note (Signed)
Take 1,000 iu daily OTC; reviewed last two lab results with him

## 2016-10-31 NOTE — Assessment & Plan Note (Signed)
Check sgpt 

## 2016-11-01 ENCOUNTER — Other Ambulatory Visit: Payer: Self-pay | Admitting: Family Medicine

## 2016-11-01 DIAGNOSIS — Z5181 Encounter for therapeutic drug level monitoring: Secondary | ICD-10-CM

## 2016-11-01 DIAGNOSIS — E782 Mixed hyperlipidemia: Secondary | ICD-10-CM

## 2016-11-01 LAB — HEPATITIS C ANTIBODY: HCV AB: NEGATIVE

## 2016-11-01 MED ORDER — ATORVASTATIN CALCIUM 10 MG PO TABS: 10.0000 mg | ORAL_TABLET | Freq: Every day | ORAL | 5 refills | Status: DC

## 2016-11-01 NOTE — Assessment & Plan Note (Signed)
Start back on statin and recheck labs in 6 weeks

## 2016-11-01 NOTE — Assessment & Plan Note (Signed)
Check sgpt in 6 weeks 

## 2016-11-01 NOTE — Assessment & Plan Note (Signed)
Managed by urologist 

## 2016-11-01 NOTE — Progress Notes (Signed)
Order labs for 6 weeks after getting back on statin

## 2016-11-01 NOTE — Assessment & Plan Note (Signed)
Urged smoking cessation; spent 3 minutes discussing smoking cessation; he does not sound like he needs nicotine replacement; bupropion should also help; refilled that as well

## 2016-11-14 ENCOUNTER — Telehealth: Payer: Self-pay

## 2016-11-14 NOTE — Telephone Encounter (Signed)
Patient called and stated Monday he saw Dr. Reche Dixon. They were not able to see him Monday because they did not have anything about his condition and back pain on file.  Patient  Will need to have records realesed from [facility] in [place] to to go to [place] so you can do a follow up with him and then get a schedule an appointment with Dr. Prescott Parma. This is what they suggest for him to do. I went ahead and called [facility] [phone] in [place] to have them faxed over the form he need to sign in order to have them to release the information to [place]. I will call him and ask him to come by our office and have him sign the form and we can go form there.

## 2016-11-18 ENCOUNTER — Ambulatory Visit: Payer: Medicaid Other | Admitting: Podiatry

## 2016-11-21 ENCOUNTER — Ambulatory Visit: Payer: Medicaid Other | Admitting: Podiatry

## 2016-12-03 ENCOUNTER — Telehealth: Payer: Self-pay | Admitting: *Deleted

## 2016-12-03 DIAGNOSIS — Z87891 Personal history of nicotine dependence: Secondary | ICD-10-CM

## 2016-12-03 NOTE — Telephone Encounter (Signed)
Notified patient that annual lung cancer screening low dose CT scan is due. Confirmed that patient is within the age range of 55-77, and asymptomatic, (no signs or symptoms of lung cancer). Patient denies illness that would prevent curative treatment for lung cancer if found. The patient is a current smoker, with a 53 pack year history. The shared decision making visit was done 12/21/15. Patient is agreeable for CT scan being scheduled.

## 2016-12-09 ENCOUNTER — Ambulatory Visit: Payer: Medicaid Other | Admitting: Podiatry

## 2016-12-22 ENCOUNTER — Ambulatory Visit
Admission: RE | Admit: 2016-12-22 | Discharge: 2016-12-22 | Disposition: A | Discharge status: Home / Self Care | Payer: Medicaid Other | Source: Ambulatory Visit | Attending: Oncology | Admitting: Oncology

## 2016-12-22 DIAGNOSIS — J929 Pleural plaque without asbestos: Secondary | ICD-10-CM | POA: Insufficient documentation

## 2016-12-22 DIAGNOSIS — J439 Emphysema, unspecified: Secondary | ICD-10-CM | POA: Diagnosis not present

## 2016-12-22 DIAGNOSIS — I700 Atherosclerosis of aorta: Secondary | ICD-10-CM | POA: Diagnosis not present

## 2016-12-22 DIAGNOSIS — I2510 Atherosclerotic heart disease of native coronary artery without angina pectoris: Secondary | ICD-10-CM | POA: Diagnosis not present

## 2016-12-22 DIAGNOSIS — Z87891 Personal history of nicotine dependence: Secondary | ICD-10-CM | POA: Diagnosis present

## 2017-01-02 ENCOUNTER — Other Ambulatory Visit: Payer: Self-pay

## 2017-01-02 ENCOUNTER — Encounter: Payer: Self-pay | Admitting: *Deleted

## 2017-01-02 ENCOUNTER — Other Ambulatory Visit: Payer: Self-pay | Admitting: Family Medicine

## 2017-01-02 DIAGNOSIS — R972 Elevated prostate specific antigen [PSA]: Secondary | ICD-10-CM

## 2017-01-02 MED ORDER — METRONIDAZOLE 500 MG PO TABS: 500.0000 mg | ORAL_TABLET | Freq: Two times a day (BID) | ORAL | 0 refills | Status: AC

## 2017-01-02 NOTE — Progress Notes (Signed)
Explained findings of wife's swab; need to treat them both

## 2017-01-04 ENCOUNTER — Other Ambulatory Visit: Payer: Self-pay | Admitting: Urology

## 2017-01-04 DIAGNOSIS — N138 Other obstructive and reflux uropathy: Secondary | ICD-10-CM

## 2017-01-04 DIAGNOSIS — N401 Enlarged prostate with lower urinary tract symptoms: Principal | ICD-10-CM

## 2017-01-05 ENCOUNTER — Other Ambulatory Visit: Payer: Self-pay | Admitting: *Deleted

## 2017-01-05 ENCOUNTER — Other Ambulatory Visit: Payer: Medicaid Other

## 2017-01-05 ENCOUNTER — Telehealth: Payer: Self-pay | Admitting: Urology

## 2017-01-05 DIAGNOSIS — N138 Other obstructive and reflux uropathy: Secondary | ICD-10-CM

## 2017-01-05 DIAGNOSIS — N401 Enlarged prostate with lower urinary tract symptoms: Principal | ICD-10-CM

## 2017-01-05 DIAGNOSIS — R972 Elevated prostate specific antigen [PSA]: Secondary | ICD-10-CM

## 2017-01-05 MED ORDER — FINASTERIDE 5 MG PO TABS: 5.0000 mg | ORAL_TABLET | Freq: Every day | ORAL | 3 refills | Status: DC

## 2017-01-05 NOTE — Telephone Encounter (Signed)
Patient came in today for labs. Patient states ran out of finasteride, per [place] refill medication. Ordered.

## 2017-01-06 LAB — PSA: Prostate Specific Ag, Serum: 4.1 ng/mL — ABNORMAL HIGH (ref 0.0–4.0)

## 2017-01-11 NOTE — Progress Notes (Deleted)
7:47 PM   Manuel [name] 01-06-1956 [id number]  Referring provider: Arnetha Courser, MD [address] [place], [place] [address]  No chief complaint on file.   HPI: Patient is a 61 year old African-American male with presents with an elevated PSA, a history of gross hematuria, erectile dysfunction and BPH with LUTS who presents today for a 6 months follow up.    Elevated PSA Patient's PSA has been variable over the last two years ranging from 4 to 6.  He has been inconsistent in taking his finasteride due to erectile dysfunction.  Because of this, it is difficult to be sure what his true PSA value is at this time.  His free and total PSA noted a 24% probability of having prostate cancer.  PCPT risk notes a 25% chance of a high grade cancer, 32% low grade cancer and a 43% chance of no cancer found on biopsy.  His father and paternal grandfather had been diagnosed with prostate cancer.  He underwent a TRUSPBx of prostate on 10/07/2016 with Dr. Noah Delaine.  Findings were 51.7 gram prostate and pathology was negative for malignancy.    History of gross hematuria Patient had an episode of clot retention in 2013.  He underwent a CT Urogram, cystoscopy with bilateral retrogrades and ultimately a TURP with Dr. Darcus Austin at [facility].  No malignancies were discovered.  Hematuria was from his enlarged, friable prostate.  He has not had any gross hematuria since that time.  A too small to characterize lesion was seen in the left midpole of the kidney on the CT Urogram in 2013.  Renal ultrasound completed on 02/14/2016 noted no renal masses, no hydronephrosis and an enlarged prostate.  His UA today ***  Erectile dysfunction His SHIM score is ***, which is *** erectile dysfunction.   His previous SHIM score was 7.  He has been having difficulty with erections since starting the finasteride.   His major complaint is now having the confidence to achieve an erections.  His libido is preserved.    His risk factors for ED are hyperlipidemia, depression, age, BPH and smoking.  He denies any painful erections or curvatures with his erections.   He states that his wife has bi-polar disease and has left him at this time.     Score: 1-7 Severe ED 8-11 Moderate ED 12-16 Mild-Moderate ED 17-21 Mild ED 22-25 No ED   BPH WITH LUTS His IPSS score today is ***, which is *** lower urinary tract symptomatology. He is *** with his quality life due to his urinary symptoms.  His previous PVR was 22 mL. His previous I PSS score was 5/3.  His major complaint today a weak urinary stream.  He has had these symptoms for over three years.  He doesn't do that he does not drink a lot of fluids during the day.   He has been taking his tamsulosin and finasteride daily.   He denies any dysuria, hematuria or suprapubic pain.  His has had a TURP.  He also denies any recent fevers, chills, nausea or vomiting.   He has a family history of PCa, with father and paternal grand father with prostate cancer.       Score:  1-7 Mild 8-19 Moderate 20-35 Severe   PMH: Past Medical History:  Diagnosis Date  . Anemia   . Anxiety   . Asthma   . Bilateral carpal tunnel syndrome   . Bilateral hand numbness   . BPH  without urinary obstruction   . Cervical spondylosis without myelopathy   . Complete tear of rotator cuff    bilateral  . COPD (chronic obstructive pulmonary disease) ([place])   . Depression   . History of stomach ulcers   . HLD (hyperlipidemia)   . LVH (left ventricular hypertrophy)   . Personal history of tobacco use, presenting hazards to health 12/21/2015  . Poor dentition   . Sickle cell trait ([place])   . Sleep apnea   . Smoker     Surgical History: Past Surgical History:  Procedure Laterality Date  . COLONOSCOPY WITH PROPOFOL N/A 12/25/2015   Procedure: COLONOSCOPY WITH PROPOFOL;  Surgeon: Lucilla Lame, MD;  Location: [facility];  Service: Endoscopy;  Laterality: N/A;  . CYSTOURETHROSCOPY   08/25/12   with ureteral cathization w/wo retrograde pyelogram  . ROTATOR CUFF REPAIR Right 01/25/15  . SHOULDER ARTHROSCOPY WITH SUBACROMIAL DECOMPRESSION Right 032416  . TRANSURETHRAL RESECTION OF PROSTATE  08/25/12    Home Medications:  Allergies as of 01/12/2017      Reactions   Penicillin G Other (See Comments)      Medication List       Accurate as of 01/11/17  7:47 PM. Always use your most recent med list.          aspirin EC 81 MG tablet Take 1 tablet (81 mg total) by mouth daily.   atorvastatin 10 MG tablet Commonly known as:  LIPITOR Take 1 tablet (10 mg total) by mouth at bedtime.   buPROPion 150 MG 24 hr tablet Commonly known as:  WELLBUTRIN XL Take 1 tablet (150 mg total) by mouth daily.   cyanocobalamin 500 MCG tablet Take 1 tablet (500 mcg total) by mouth daily.   escitalopram 10 MG tablet Commonly known as:  LEXAPRO Take 1.5 tablets (15 mg total) by mouth daily.   finasteride 5 MG tablet Commonly known as:  PROSCAR Take 1 tablet (5 mg total) by mouth daily.   HYDROcodone-acetaminophen 5-325 MG tablet Commonly known as:  NORCO/VICODIN Take 1 tablet by mouth as needed.   tamsulosin 0.4 MG Caps capsule Commonly known as:  FLOMAX Take 1 capsule (0.4 mg total) by mouth daily.       Allergies:  Allergies  Allergen Reactions  . Penicillin G Other (See Comments)    Family History: Family History  Problem Relation Age of Onset  . Aneurysm Mother   . Hypertension Mother   . Diabetes Father   . Hypertension Father   . Prostate cancer Father   . Cancer Maternal Aunt   . Cancer Maternal Uncle   . Diabetes Paternal Uncle   . Heart disease Cousin   . Prostate cancer Paternal Uncle   . COPD Neg Hx   . Stroke Neg Hx   . Kidney disease Neg Hx     Social History:  reports that he has been smoking Cigarettes.  He has a 17.50 pack-year smoking history. He has never used smokeless tobacco. He reports that he does not drink alcohol or use  drugs.  ROS:                                        Physical Exam: There were no vitals taken for this visit.  Constitutional: Well nourished. Alert and oriented, No acute distress. HEENT: [place] AT, moist mucus membranes. Trachea midline, no masses. Cardiovascular: No clubbing, cyanosis, or edema.  Respiratory: Normal respiratory effort, no increased work of breathing. Skin: No rashes, bruises or suspicious lesions. Lymph: No cervical or inguinal adenopathy. Neurologic: Grossly intact, no focal deficits, moving all 4 extremities. Psychiatric: Normal mood and affect.  Laboratory Data: Lab Results  Component Value Date   WBC 5.8 10/31/2016   HGB 13.3 10/31/2016   HCT 40.7 10/31/2016   MCV 75.4 (L) 10/31/2016   PLT 240 10/31/2016    Lab Results  Component Value Date   CREATININE 1.36 (H) 10/31/2016  PSA History  4.43 ng/mL on 12/01/2013  4.67 ng/mL on 08/28/2014  4.80 ng/mL on 11/02/2015  6.10 ng/mL on 02/04/2016  5.10 ng/mL on 02/19/2016  4.70 ng.mL on 05/08/2016   Results for orders placed or performed in visit on 01/05/17  PSA  Result Value Ref Range   Prostate Specific Ag, Serum 4.1 (H) 0.0 - 4.0 ng/mL   Urinalysis ***  Assessment & Plan:    1.  Elevated PSA  - PSA is stable at 4  - RTC in 6 months for PSA and exam  2. History of gross hematuria  - UA today ***  3. Erectile dysfunction  - SHIM score is ***, it is stable, worsening, improving  - I explained to the patient that in order to achieve an erection it takes good functioning of the nervous system (parasympathetic, sympathetic, sensory and motor), good blood flow into the erectile tissue of the penis and a desire to have sex  - I explained that conditions like diabetes, hypertension, coronary artery disease, peripheral vascular disease, smoking, alcohol consumption, age, sleep apnea and BPH can diminish the ability to have an erection  - We discussed trying a *** different PDE5  inhibitor, intra-urethral suppositories, intracavernous vasoactive drug injection therapy, vacuum constriction device and penile prosthesis implantation  - He would like to try ***  - Continue Cialis, Viagra, Levitra, Stendra  - RTC in *** months for repeat SHIM score and exam    4. BPH with LUTS  - IPSS score is ***, it is stable/improving/worsening  - Continue conservative management, avoiding bladder irritants and timed voiding's  - most bothersome symptoms is/are ***  - Initiate alpha-blocker (***), discussed side effects  - Initiate 5 alpha reductase inhibitor (***), discussed side effects  - Continue tamsulosin 0.4 mg daily and finasteride 5 mg daily; refills given  - Cannot tolerate medication or medication failure, schedule cystoscopy ***  - RTC in *** months for IPSS, PSA, PVR and exam      No Follow-up on file.  These notes generated with voice recognition software. I apologize for typographical errors.  Zara Council, [facility] [address] [place], [place] [address] [phone]

## 2017-01-12 ENCOUNTER — Ambulatory Visit: Payer: Medicaid Other | Admitting: Urology

## 2017-01-13 ENCOUNTER — Ambulatory Visit: Payer: Medicaid Other | Admitting: Podiatry

## 2017-01-20 ENCOUNTER — Ambulatory Visit: Payer: Medicaid Other | Admitting: Podiatry

## 2017-01-29 ENCOUNTER — Ambulatory Visit (INDEPENDENT_AMBULATORY_CARE_PROVIDER_SITE_OTHER): Payer: Medicaid Other | Admitting: Family Medicine

## 2017-01-29 ENCOUNTER — Encounter: Payer: Self-pay | Admitting: Family Medicine

## 2017-01-29 DIAGNOSIS — N182 Chronic kidney disease, stage 2 (mild): Secondary | ICD-10-CM | POA: Diagnosis not present

## 2017-01-29 DIAGNOSIS — I517 Cardiomegaly: Secondary | ICD-10-CM

## 2017-01-29 DIAGNOSIS — F17200 Nicotine dependence, unspecified, uncomplicated: Secondary | ICD-10-CM | POA: Diagnosis not present

## 2017-01-29 DIAGNOSIS — A5903 Trichomonal cystitis and urethritis: Secondary | ICD-10-CM | POA: Insufficient documentation

## 2017-01-29 DIAGNOSIS — E782 Mixed hyperlipidemia: Secondary | ICD-10-CM | POA: Diagnosis not present

## 2017-01-29 DIAGNOSIS — D573 Sickle-cell trait: Secondary | ICD-10-CM

## 2017-01-29 DIAGNOSIS — Z5181 Encounter for therapeutic drug level monitoring: Secondary | ICD-10-CM

## 2017-01-29 DIAGNOSIS — E538 Deficiency of other specified B group vitamins: Secondary | ICD-10-CM

## 2017-01-29 LAB — COMPLETE METABOLIC PANEL WITH GFR
ALT: 12 U/L (ref 9–46)
AST: 19 U/L (ref 10–35)
Albumin: 3.7 g/dL (ref 3.6–5.1)
Alkaline Phosphatase: 60 U/L (ref 40–115)
BUN: 10 mg/dL (ref 7–25)
CHLORIDE: 103 mmol/L (ref 98–110)
CO2: 25 mmol/L (ref 20–31)
Calcium: 9 mg/dL (ref 8.6–10.3)
Creat: 1.56 mg/dL — ABNORMAL HIGH (ref 0.70–1.33)
GFR, EST AFRICAN AMERICAN: 56 mL/min — AB (ref >=60)
GFR, EST NON AFRICAN AMERICAN: 48 mL/min — AB (ref >=60)
Glucose, Bld: 84 mg/dL (ref 65–99)
POTASSIUM: 4.1 mmol/L (ref 3.5–5.3)
Sodium: 140 mmol/L (ref 135–146)
Total Bilirubin: 0.3 mg/dL (ref 0.2–1.2)
Total Protein: 6.6 g/dL (ref 6.1–8.1)

## 2017-01-29 LAB — LIPID PANEL
Cholesterol: 193 mg/dL (ref <200)
HDL: 44 mg/dL (ref >40)
LDL CALC: 120 mg/dL — AB (ref <100)
TRIGLYCERIDES: 146 mg/dL (ref <150)
Total CHOL/HDL Ratio: 4.4 Ratio (ref <5.0)
VLDL: 29 mg/dL (ref <30)

## 2017-01-29 NOTE — Progress Notes (Signed)
BP 124/82   Pulse 99   Temp 98 F (36.7 C) (Oral)   Resp 16   Wt 168 lb 11.2 oz (76.5 kg)   SpO2 97%   BMI 24.21 kg/m    Subjective:    Patient ID: Manuel [name], male    DOB: 01-30-1956, 61 y.o.   MRN: [id number]  HPI: Manuel [name] is a 61 y.o. male  Chief Complaint  Patient presents with  . Follow-up   Patient is here with his wife Having daily migraines; "really bad migraines"; he changed glassess and then the headaches started; started that very day; he has bifocals; he leaves them off when he doesn't need them on, but doctor told him to keep them on his face; head hurts across the temples, like a band across the front and sides; 7 out of 10 at times; doesn't bother him at work as much; drinking enough water; drinks soft drinks his wife says, but he only has one drink of water at work; he has sickle cell trait  Wife was recently diagnosed with trich; he was treated with metronidazole; no discharge  BP controlled today; hx of LVH  Taking prostate medicine; seeing urologistl; elev PSA  High cholesterol; not many eggs anymore after we talked; taking statin; due for labs today  Cr was 1.36, GFR 66; taking some OTC meds at times for headaches  Taking vitamins; last B12 was less than 400  Depression screen [facility] 2/9 01/29/2017 10/31/2016 08/01/2016 06/02/2016 06/02/2016  Decreased Interest 0 0 0 1 0  Down, Depressed, Hopeless 1 0 0 3 1  PHQ - 2 Score 1 0 0 4 1  Altered sleeping - - - 3 -  Tired, decreased energy - - - 3 -  Change in appetite - - - 1 -  Feeling bad or failure about yourself  - - - 1 -  Trouble concentrating - - - 1 -  Moving slowly or fidgety/restless - - - 1 -  Suicidal thoughts - - - 0 -  PHQ-9 Score - - - 14 -  Difficult doing work/chores - - - Very difficult -    Relevant past medical, surgical, family and social history reviewed Past Medical History:  Diagnosis Date  . Anemia   . Anxiety   . Asthma   . Bilateral carpal tunnel syndrome   .  Bilateral hand numbness   . BPH without urinary obstruction   . Cervical spondylosis without myelopathy   . Complete tear of rotator cuff    bilateral  . COPD (chronic obstructive pulmonary disease) ([place])   . Depression   . History of stomach ulcers   . HLD (hyperlipidemia)   . LVH (left ventricular hypertrophy)   . Personal history of tobacco use, presenting hazards to health 12/21/2015  . Poor dentition   . Sickle cell trait ([place])   . Sleep apnea   . Smoker    Past Surgical History:  Procedure Laterality Date  . COLONOSCOPY WITH PROPOFOL N/A 12/25/2015   Procedure: COLONOSCOPY WITH PROPOFOL;  Surgeon: Lucilla Lame, MD;  Location: [facility];  Service: Endoscopy;  Laterality: N/A;  . CYSTOURETHROSCOPY  08/25/12   with ureteral cathization w/wo retrograde pyelogram  . ROTATOR CUFF REPAIR Right 01/25/15  . SHOULDER ARTHROSCOPY WITH SUBACROMIAL DECOMPRESSION Right 032416  . TRANSURETHRAL RESECTION OF PROSTATE  08/25/12   Family History  Problem Relation Age of Onset  . Aneurysm Mother   . Hypertension Mother   . Diabetes Father   .  Hypertension Father   . Prostate cancer Father   . Cancer Maternal Aunt   . Cancer Maternal Uncle   . Diabetes Paternal Uncle   . Heart disease Cousin   . Prostate cancer Paternal Uncle   . COPD Neg Hx   . Stroke Neg Hx   . Kidney disease Neg Hx    Social History  Substance Use Topics  . Smoking status: Current Every Day Smoker    Packs/day: 0.50    Years: 35.00    Types: Cigarettes  . Smokeless tobacco: Never Used  . Alcohol use No    Interim medical history since last visit reviewed. Allergies and medications reviewed  Review of Systems Per HPI unless specifically indicated above     Objective:    BP 124/82   Pulse 99   Temp 98 F (36.7 C) (Oral)   Resp 16   Wt 168 lb 11.2 oz (76.5 kg)   SpO2 97%   BMI 24.21 kg/m   Wt Readings from Last 3 Encounters:  01/29/17 168 lb 11.2 oz (76.5 kg)  12/22/16 167 lb (75.8 kg)    10/31/16 165 lb (74.8 kg)    Physical Exam  Constitutional: He appears well-developed and well-nourished. No distress.  HENT:  Mouth/Throat: Mucous membranes are normal. He has dentures.  Eyes: No scleral icterus.  Cardiovascular: Normal rate and regular rhythm.   Pulmonary/Chest: Effort normal and breath sounds normal.  Abdominal: Soft. He exhibits no distension.  Neurological: He is alert. He displays no tremor. Gait normal.  Skin: Skin is warm. No pallor.  Psychiatric: He has a normal mood and affect. His behavior is normal. Judgment and thought content normal.  He has worn the glasses at different levels during our exam  Results for orders placed or performed in visit on 01/05/17  PSA  Result Value Ref Range   Prostate Specific Ag, Serum 4.1 (H) 0.0 - 4.0 ng/mL      Assessment & Plan:   Problem List Items Addressed This Visit      Cardiovascular and Mediastinum   LVH (left ventricular hypertrophy)    Controlled BP, avoid salt; quit smoking; healthy weight        Genitourinary   Trichomonal urethritis in male    Check urine today      Relevant Orders   Trichomonas vaginalis RNA, Ql,Males   Chronic kidney disease (CKD), stage II (mild)    Check Cr, hydrate, avoid NSAIDs      Relevant Orders   COMPLETE METABOLIC PANEL WITH GFR     Other   Vitamin B12 deficiency    Encouraged adequate B12 supplementation      Sickle cell trait ([place])    Reviewed CBC      Medication monitoring encounter    Check labs      Relevant Orders   COMPLETE METABOLIC PANEL WITH GFR   Hyperlipidemia    Check lipids today; limit saturated fats      Relevant Orders   Lipid panel   Current smoker    Urged patient to quit smoking          Follow up plan: Return in about 3 months (around 05/01/2017).  An after-visit summary was printed and given to the patient at [facility].  Please see the patient instructions which may contain other information and recommendations beyond  what is mentioned above in the assessment and plan.  No orders of the defined types were placed in this encounter.   Orders Placed  This Encounter  Procedures  . COMPLETE METABOLIC PANEL WITH GFR  . Lipid panel

## 2017-01-29 NOTE — Assessment & Plan Note (Signed)
Encouraged adequate B12 supplementation

## 2017-01-29 NOTE — Assessment & Plan Note (Signed)
Check lipids today; limit saturated fats 

## 2017-01-29 NOTE — Assessment & Plan Note (Signed)
Controlled BP, avoid salt; quit smoking; healthy weight

## 2017-01-29 NOTE — Patient Instructions (Addendum)
I do encourage you to quit smoking Call [phone] to sign up for smoking cessation classes You can call 1-800-QUIT-NOW to talk with a smoking cessation coach  Do wear your glasses at the same distance from your eyes regularly Try to drink 64 ounces of water through the day If your headaches persist after those changes, then let me know  Try to limit saturated fats in your diet (bologna, hot dogs, barbeque, cheeseburgers, hamburgers, steak, bacon, sausage, cheese, etc.) and get more fresh fruits, vegetables, and whole grains  Get at least 6 mcg of vitamin B12  Steps to Quit Smoking Smoking tobacco can be bad for your health. It can also affect almost every organ in your body. Smoking puts you and people around you at risk for many serious long-lasting (chronic) diseases. Quitting smoking is hard, but it is one of the best things that you can do for your health. It is never too late to quit. What are the benefits of quitting smoking? When you quit smoking, you lower your risk for getting serious diseases and conditions. They can include:  Lung cancer or lung disease.  Heart disease.  Stroke.  Heart attack.  Not being able to have children (infertility).  Weak bones (osteoporosis) and broken bones (fractures). If you have coughing, wheezing, and shortness of breath, those symptoms may get better when you quit. You may also get sick less often. If you are pregnant, quitting smoking can help to lower your chances of having a baby of low birth weight. What can I do to help me quit smoking? Talk with your doctor about what can help you quit smoking. Some things you can do (strategies) include:  Quitting smoking totally, instead of slowly cutting back how much you smoke over a period of time.  Going to in-person counseling. You are more likely to quit if you go to many counseling sessions.  Using resources and support systems, such as:  Online chats with a Social worker.  Phone  quitlines.  Printed Furniture conservator/restorer.  Support groups or group counseling.  Text messaging programs.  Mobile phone apps or applications.  Taking medicines. Some of these medicines may have nicotine in them. If you are pregnant or breastfeeding, do not take any medicines to quit smoking unless your doctor says it is okay. Talk with your doctor about counseling or other things that can help you. Talk with your doctor about using more than one strategy at the same time, such as taking medicines while you are also going to in-person counseling. This can help make quitting easier. What things can I do to make it easier to quit? Quitting smoking might feel very hard at first, but there is a lot that you can do to make it easier. Take these steps:  Talk to your family and friends. Ask them to support and encourage you.  Call phone quitlines, reach out to support groups, or work with a Social worker.  Ask people who smoke to not smoke around you.  Avoid places that make you want (trigger) to smoke, such as:  Bars.  Parties.  Smoke-break areas at work.  Spend time with people who do not smoke.  Lower the stress in your life. Stress can make you want to smoke. Try these things to help your stress:  Getting regular exercise.  Deep-breathing exercises.  Yoga.  Meditating.  Doing a body scan. To do this, close your eyes, focus on one area of your body at a time from head to toe,  and notice which parts of your body are tense. Try to relax the muscles in those areas.  Download or buy apps on your mobile phone or tablet that can help you stick to your quit plan. There are many free apps, such as QuitGuide from the [employer] Office manager for Disease Control and Prevention). You can find more support from smokefree.gov and other websites. This information is not intended to replace advice given to you by your health care provider. Make sure you discuss any questions you have with your health care  provider. Document Released: 08/16/2009 Document Revised: 06/17/2016 Document Reviewed: 03/06/2015 Elsevier Interactive Patient Education  2017 [employer].

## 2017-01-29 NOTE — Assessment & Plan Note (Signed)
Check Cr, hydrate, avoid NSAIDs

## 2017-01-29 NOTE — Assessment & Plan Note (Signed)
Urged patient to quit smoking 

## 2017-01-29 NOTE — Assessment & Plan Note (Signed)
Check urine today 

## 2017-01-29 NOTE — Assessment & Plan Note (Signed)
Check labs 

## 2017-01-29 NOTE — Assessment & Plan Note (Signed)
Reviewed CBC

## 2017-01-30 LAB — TRICHOMONAS VAGINALIS RNA, QL,MALES: Trichomonas vaginalis RNA: NOT DETECTED

## 2017-02-02 ENCOUNTER — Other Ambulatory Visit: Payer: Self-pay

## 2017-02-02 DIAGNOSIS — N289 Disorder of kidney and ureter, unspecified: Secondary | ICD-10-CM

## 2017-02-03 NOTE — Progress Notes (Deleted)
2:23 PM   Manuel [name] [birth year] [id number]  Referring provider: Arnetha Courser, MD [address] [place], [place] [address]  No chief complaint on file.   HPI: Patient is a 61 year old African-American male with presents with an elevated PSA, a history of gross hematuria, erectile dysfunction and BPH with LUTS who presents today for a 6 months follow up.    Elevated PSA Patient's PSA has been variable over the last two years ranging from 4 to 6.  He has been inconsistent in taking his finasteride due to erectile dysfunction.  Because of this, it is difficult to be sure what his true PSA value is at this time.  His free and total PSA noted a 24% probability of having prostate cancer.  PCPT risk notes a 25% chance of a high grade cancer, 32% low grade cancer and a 43% chance of no cancer found on biopsy.  His father and paternal grandfather had been diagnosed with prostate cancer.  He underwent a TRUSPBx of prostate on 10/07/2016 with Dr. Noah Delaine.  Findings were 51.7 gram prostate and pathology was negative for malignancy.  PSA was 4.1 ng/mL on 01/05/2017.    History of gross hematuria Patient had an episode of clot retention in 2013.  He underwent a CT Urogram, cystoscopy with bilateral retrogrades and ultimately a TURP with Dr. Darcus Austin at [facility].  No malignancies were discovered.  Hematuria was from his enlarged, friable prostate.  He has not had any gross hematuria since that time.  A too small to characterize lesion was seen in the left midpole of the kidney on the CT Urogram in 2013.  Renal ultrasound completed on 02/14/2016 noted no renal masses, no hydronephrosis and an enlarged prostate.  His UA today ***  Erectile dysfunction His SHIM score is ***, which is *** erectile dysfunction.   His previous SHIM score was 7.  He has been having difficulty with erections since starting the finasteride.   His major complaint is now having the confidence to achieve an  erections.  His libido is preserved.   His risk factors for ED are hyperlipidemia, depression, age, BPH and smoking.  He denies any painful erections or curvatures with his erections.   He states that his wife has bi-polar disease and has left him at this time.     Score: 1-7 Severe ED 8-11 Moderate ED 12-16 Mild-Moderate ED 17-21 Mild ED 22-25 No ED   BPH WITH LUTS His IPSS score today is ***, which is *** lower urinary tract symptomatology. He is *** with his quality life due to his urinary symptoms.  His previous PVR was 22 mL. His previous I PSS score was 5/3.  His major complaint today a weak urinary stream.  He has had these symptoms for over three years.  He doesn't do that he does not drink a lot of fluids during the day.   He has been taking his tamsulosin and finasteride daily.   He denies any dysuria, hematuria or suprapubic pain.  His has had a TURP.  He also denies any recent fevers, chills, nausea or vomiting.   He has a family history of PCa, with father and paternal grand father with prostate cancer.       Score:  1-7 Mild 8-19 Moderate 20-35 Severe   PMH: Past Medical History:  Diagnosis Date  . Anemia   . Anxiety   . Asthma   . Bilateral carpal tunnel syndrome   .  Bilateral hand numbness   . BPH without urinary obstruction   . Cervical spondylosis without myelopathy   . Complete tear of rotator cuff    bilateral  . COPD (chronic obstructive pulmonary disease) ([place])   . Depression   . History of stomach ulcers   . HLD (hyperlipidemia)   . LVH (left ventricular hypertrophy)   . Personal history of tobacco use, presenting hazards to health 12/21/2015  . Poor dentition   . Sickle cell trait ([place])   . Sleep apnea   . Smoker     Surgical History: Past Surgical History:  Procedure Laterality Date  . COLONOSCOPY WITH PROPOFOL N/A 12/25/2015   Procedure: COLONOSCOPY WITH PROPOFOL;  Surgeon: Lucilla Lame, MD;  Location: [facility];  Service: Endoscopy;   Laterality: N/A;  . CYSTOURETHROSCOPY  08/25/12   with ureteral cathization w/wo retrograde pyelogram  . ROTATOR CUFF REPAIR Right 01/25/15  . SHOULDER ARTHROSCOPY WITH SUBACROMIAL DECOMPRESSION Right [id number]  . TRANSURETHRAL RESECTION OF PROSTATE  08/25/12    Home Medications:  Allergies as of 02/04/2017      Reactions   Penicillin G Other (See Comments)      Medication List       Accurate as of 02/03/17  2:23 PM. Always use your most recent med list.          aspirin EC 81 MG tablet Take 1 tablet (81 mg total) by mouth daily.   atorvastatin 10 MG tablet Commonly known as:  LIPITOR Take 1 tablet (10 mg total) by mouth at bedtime.   buPROPion 150 MG 24 hr tablet Commonly known as:  WELLBUTRIN XL Take 1 tablet (150 mg total) by mouth daily.   cyanocobalamin 500 MCG tablet Take 1 tablet (500 mcg total) by mouth daily.   escitalopram 10 MG tablet Commonly known as:  LEXAPRO Take 1.5 tablets (15 mg total) by mouth daily.   finasteride 5 MG tablet Commonly known as:  PROSCAR Take 1 tablet (5 mg total) by mouth daily.   HYDROcodone-acetaminophen 5-325 MG tablet Commonly known as:  NORCO/VICODIN Take 1 tablet by mouth as needed.   tamsulosin 0.4 MG Caps capsule Commonly known as:  FLOMAX Take 1 capsule (0.4 mg total) by mouth daily.       Allergies:  Allergies  Allergen Reactions  . Penicillin G Other (See Comments)    Family History: Family History  Problem Relation Age of Onset  . Aneurysm Mother   . Hypertension Mother   . Diabetes Father   . Hypertension Father   . Prostate cancer Father   . Cancer Maternal Aunt   . Cancer Maternal Uncle   . Diabetes Paternal Uncle   . Heart disease Cousin   . Prostate cancer Paternal Uncle   . COPD Neg Hx   . Stroke Neg Hx   . Kidney disease Neg Hx     Social History:  reports that he has been smoking Cigarettes.  He has a 17.50 pack-year smoking history. He has never used smokeless tobacco. He reports that he  does not drink alcohol or use drugs.  ROS:                                        Physical Exam: There were no vitals taken for this visit.  Constitutional: Well nourished. Alert and oriented, No acute distress. HEENT: [place] AT, moist mucus membranes. Trachea midline, no  masses. Cardiovascular: No clubbing, cyanosis, or edema. Respiratory: Normal respiratory effort, no increased work of breathing. GI: Abdomen is soft, non tender, non distended, no abdominal masses. Liver and spleen not palpable.  No hernias appreciated.  Stool sample for occult testing is not indicated.   GU: No CVA tenderness.  No bladder fullness or masses.  Patient with circumcised/uncircumcised phallus. ***Foreskin easily retracted***  Urethral meatus is patent.  No penile discharge. No penile lesions or rashes. Scrotum without lesions, cysts, rashes and/or edema.  Testicles are located scrotally bilaterally. No masses are appreciated in the testicles. Left and right epididymis are normal. Rectal: Patient with  normal sphincter tone. Anus and perineum without scarring or rashes. No rectal masses are appreciated. Prostate is approximately *** grams, *** nodules are appreciated. Seminal vesicles are normal. Skin: No rashes, bruises or suspicious lesions. Lymph: No cervical or inguinal adenopathy. Neurologic: Grossly intact, no focal deficits, moving all 4 extremities. Psychiatric: Normal mood and affect.  Laboratory Data: Lab Results  Component Value Date   WBC 5.8 10/31/2016   HGB 13.3 10/31/2016   HCT 40.7 10/31/2016   MCV 75.4 (L) 10/31/2016   PLT 240 10/31/2016    Lab Results  Component Value Date   CREATININE 1.56 (H) 01/29/2017  PSA History  4.43 ng/mL on 12/01/2013  4.67 ng/mL on 08/28/2014  4.80 ng/mL on 11/02/2015  6.10 ng/mL on 02/04/2016  5.10 ng/mL on 02/19/2016  4.70 ng/mL on 05/08/2016  4.10 ng/mL on 01/05/2017   Results for orders placed or performed in visit on  01/29/17  COMPLETE METABOLIC PANEL WITH GFR  Result Value Ref Range   Sodium 140 135 - 146 mmol/L   Potassium 4.1 3.5 - 5.3 mmol/L   Chloride 103 98 - 110 mmol/L   CO2 25 20 - 31 mmol/L   Glucose, Bld 84 65 - 99 mg/dL   BUN 10 7 - 25 mg/dL   Creat 1.56 (H) 0.70 - 1.33 mg/dL   Total Bilirubin 0.3 0.2 - 1.2 mg/dL   Alkaline Phosphatase 60 40 - 115 U/L   AST 19 10 - 35 U/L   ALT 12 9 - 46 U/L   Total Protein 6.6 6.1 - 8.1 g/dL   Albumin 3.7 3.6 - 5.1 g/dL   Calcium 9.0 8.6 - 10.3 mg/dL   GFR, Est African American 56 (L) >=60 mL/min   GFR, Est Non African American 48 (L) >=60 mL/min  Lipid panel  Result Value Ref Range   Cholesterol 193 <200 mg/dL   Triglycerides 146 <150 mg/dL   HDL 44 >40 mg/dL   Total CHOL/HDL Ratio 4.4 <5.0 Ratio   VLDL 29 <30 mg/dL   LDL Cholesterol 120 (H) <100 mg/dL  Trichomonas vaginalis RNA, Ql,Males  Result Value Ref Range   Trichomonas vaginalis RNA Not Detected Not Detected   Urinalysis ***  Assessment & Plan:    1.  Elevated PSA  - PSA is stable at 4  - RTC in 6 months for PSA and exam  2. History of gross hematuria  - UA today ***  3. Erectile dysfunction  - SHIM score is ***, it is stable, worsening, improving  - I explained to the patient that in order to achieve an erection it takes good functioning of the nervous system (parasympathetic, sympathetic, sensory and motor), good blood flow into the erectile tissue of the penis and a desire to have sex  - I explained that conditions like diabetes, hypertension, coronary artery disease, peripheral vascular disease, smoking, alcohol  consumption, age, sleep apnea and BPH can diminish the ability to have an erection  - We discussed trying a *** different PDE5 inhibitor, intra-urethral suppositories, intracavernous vasoactive drug injection therapy, vacuum constriction device and penile prosthesis implantation  - He would like to try ***  - Continue Cialis, Viagra, Levitra, Stendra  - RTC in ***  months for repeat SHIM score and exam    4. BPH with LUTS  - IPSS score is ***, it is stable/improving/worsening  - Continue conservative management, avoiding bladder irritants and timed voiding's  - most bothersome symptoms is/are ***  - Initiate alpha-blocker (***), discussed side effects  - Initiate 5 alpha reductase inhibitor (***), discussed side effects  - Continue tamsulosin 0.4 mg daily and finasteride 5 mg daily; refills given  - Cannot tolerate medication or medication failure, schedule cystoscopy ***  - RTC in *** months for IPSS, PSA, PVR and exam      No Follow-up on file.  These notes generated with voice recognition software. I apologize for typographical errors.  Zara Council, [facility] [address] [place], [place] [address] [phone]

## 2017-02-04 ENCOUNTER — Ambulatory Visit: Payer: Medicaid Other | Admitting: Urology

## 2017-02-04 ENCOUNTER — Encounter: Payer: Self-pay | Admitting: Urology

## 2017-02-06 ENCOUNTER — Encounter: Payer: Self-pay | Admitting: Podiatry

## 2017-02-06 ENCOUNTER — Ambulatory Visit (INDEPENDENT_AMBULATORY_CARE_PROVIDER_SITE_OTHER): Payer: Medicaid Other | Admitting: Podiatry

## 2017-02-06 DIAGNOSIS — B353 Tinea pedis: Secondary | ICD-10-CM | POA: Diagnosis not present

## 2017-02-06 MED ORDER — KETOCONAZOLE 2 % EX CREA: 1.0000 "application " | TOPICAL_CREAM | Freq: Every day | CUTANEOUS | 1 refills | Status: DC

## 2017-02-06 NOTE — Progress Notes (Signed)
   Subjective:  Patient presents today for evaluation and treatment of blisters and calluses on the bottom of his feet bilateral. Patient states is very painful when walking and standing. Patient works at a [employer] and his feet are wet during the day.    Objective/Physical Exam General: The patient is alert and oriented x3 in no acute distress.  Dermatology: Small blister lesions noted diffusely throughout the bilateral weightbearing surfaces of the feet consistent with a blistering type tinea pedis.  Vascular: Palpable pedal pulses bilaterally. No edema or erythema noted. Capillary refill within normal limits.  Neurological: Epicritic and protective threshold grossly intact bilaterally.   Musculoskeletal Exam: Hallux abductovalgus with bunion deformity noted clinically with rigid hammertoe contracture digits 2-5 bilateral.    Assessment: #1 blistering tinea pedis bilateral #2 pruritus of skin bilateral #3 generalized foot pain #4 hallux abductovalgus with bunion deformity #5 hammertoes digits 2-5 bilateral   Plan of Care:  #1 Patient was evaluated. #2 prescription for ketoconazole 2% cream #3 recommend over-the-counter insoles at [employer] #4 return to clinic when necessary   Edrick Kins, DPM [facility]  Dr. Edrick Kins, [place]                                        [place], [place] [address]                Office [phone]  Fax [phone]

## 2017-02-11 ENCOUNTER — Telehealth: Payer: Self-pay

## 2017-02-11 NOTE — Telephone Encounter (Signed)
-----   Message from Nori Riis, PA-C sent at 02/11/2017  7:42 AM EDT ----- Patient missed his follow up appointment.  Please have him reschedule.

## 2017-02-11 NOTE — Telephone Encounter (Signed)
LMOM

## 2017-02-12 NOTE — Telephone Encounter (Signed)
Patient called back to reschd  Manuel [name]

## 2017-02-19 ENCOUNTER — Ambulatory Visit (INDEPENDENT_AMBULATORY_CARE_PROVIDER_SITE_OTHER): Payer: Medicaid Other | Admitting: Urology

## 2017-02-19 ENCOUNTER — Encounter: Payer: Self-pay | Admitting: Urology

## 2017-02-19 ENCOUNTER — Other Ambulatory Visit: Payer: Self-pay

## 2017-02-19 VITALS — BP 120/78 | HR 80 | Ht 70.0 in | Wt 165.3 lb

## 2017-02-19 DIAGNOSIS — N401 Enlarged prostate with lower urinary tract symptoms: Secondary | ICD-10-CM

## 2017-02-19 DIAGNOSIS — N469 Male infertility, unspecified: Secondary | ICD-10-CM

## 2017-02-19 DIAGNOSIS — N529 Male erectile dysfunction, unspecified: Secondary | ICD-10-CM | POA: Diagnosis not present

## 2017-02-19 DIAGNOSIS — R972 Elevated prostate specific antigen [PSA]: Secondary | ICD-10-CM

## 2017-02-19 DIAGNOSIS — N289 Disorder of kidney and ureter, unspecified: Secondary | ICD-10-CM

## 2017-02-19 DIAGNOSIS — Z87448 Personal history of other diseases of urinary system: Secondary | ICD-10-CM

## 2017-02-19 DIAGNOSIS — N138 Other obstructive and reflux uropathy: Secondary | ICD-10-CM

## 2017-02-19 LAB — BASIC METABOLIC PANEL WITH GFR
BUN: 10 mg/dL (ref 7–25)
CALCIUM: 9.1 mg/dL (ref 8.6–10.3)
CO2: 26 mmol/L (ref 20–31)
CREATININE: 1.46 mg/dL — AB (ref 0.70–1.33)
Chloride: 105 mmol/L (ref 98–110)
GFR, EST AFRICAN AMERICAN: 60 mL/min (ref >=60)
GFR, EST NON AFRICAN AMERICAN: 52 mL/min — AB (ref >=60)
GLUCOSE: 89 mg/dL (ref 65–99)
Potassium: 3.9 mmol/L (ref 3.5–5.3)
SODIUM: 139 mmol/L (ref 135–146)

## 2017-02-19 NOTE — Progress Notes (Signed)
4:52 PM   Manuel [name] 11-03-1956 [id number]  Referring provider: Arnetha Courser, MD [address] [place], [place] [address]  Chief Complaint  Patient presents with  . Benign Prostatic Hypertrophy    6 month follow up   . Elevated PSA    HPI: Patient is a 61 year old African-American male with presents with an elevated PSA, a history of gross hematuria, erectile dysfunction and BPH with LUTS who presents today for a 6 months follow up.    Elevated PSA Patient's PSA has been variable over the last two years ranging from 4 to 6.  He has been inconsistent in taking his finasteride due to erectile dysfunction.  Because of this, it is difficult to be sure what his true PSA value is at this time.  His free and total PSA noted a 24% probability of having prostate cancer.  PCPT risk notes a 25% chance of a high grade cancer, 32% low grade cancer and a 43% chance of no cancer found on biopsy.  His father and paternal grandfather had been diagnosed with prostate cancer.  He underwent a TRUSPBx of prostate on 10/07/2016 with Dr. Noah Delaine.  Findings were 51.7 gram prostate and pathology was negative for malignancy.  PSA was 4.1 ng/mL on 01/05/2017.    History of gross hematuria Patient had an episode of clot retention in 2013.  He underwent a CT Urogram, cystoscopy with bilateral retrogrades and ultimately a TURP with Dr. Darcus Austin at [facility].  No malignancies were discovered.  Hematuria was from his enlarged, friable prostate.  He has not had any gross hematuria since that time.  A too small to characterize lesion was seen in the left midpole of the kidney on the CT Urogram in 2013.  Renal ultrasound completed on 02/14/2016 noted no renal masses, no hydronephrosis and an enlarged prostate.  He denies any gross hematuria.    Erectile dysfunction His SHIM score is 18, which is mild erectile dysfunction.   His previous SHIM score was 7.  He has been having difficulty with erections  since starting the finasteride.   His major complaint is now having the confidence to achieve an erections.  His libido is preserved.   His risk factors for ED are hyperlipidemia, depression, age, BPH and smoking.  He denies any painful erections or curvatures with his erections.   He states that his wife has bi-polar disease and has left him at this time.       SHIM    Row Name 02/19/17 1630         SHIM: Over the last 6 months:   How do you rate your confidence that you could get and keep an erection? Moderate     When you had erections with sexual stimulation, how often were your erections hard enough for penetration (entering your partner)? Most Times (much more than half the time)     During sexual intercourse, how often were you able to maintain your erection after you had penetrated (entered) your partner? Sometimes (about half the time)     During sexual intercourse, how difficult was it to maintain your erection to completion of intercourse? Slightly Difficult     When you attempted sexual intercourse, how often was it satisfactory for you? Most Times (much more than half the time)       SHIM Total Score   SHIM 18        Score: 1-7 Severe ED 8-11 Moderate ED 12-16  Mild-Moderate ED 17-21 Mild ED 22-25 No ED   BPH WITH LUTS His IPSS score today is 10, which is moderate lower urinary tract symptomatology. He is mixed with his quality life due to his urinary symptoms.  His previous PVR was 22 mL. His previous I PSS score was 5/3.  His major complaint today a weak urinary stream.  He has had these symptoms for over three years.  He doesn't do that he does not drink a lot of fluids during the day.   He has been taking his tamsulosin and finasteride daily.   He denies any dysuria, hematuria or suprapubic pain.  His has had a TURP.  He also denies any recent fevers, chills, nausea or vomiting.   He has a family history of PCa, with father and paternal grand father with prostate cancer.          IPSS    Row Name 02/19/17 1600         International Prostate Symptom Score   How often have you had the sensation of not emptying your bladder? About half the time     How often have you had to urinate less than every two hours? About half the time     How often have you found you stopped and started again several times when you urinated? Not at All     How often have you found it difficult to postpone urination? Less than 1 in 5 times     How often have you had a weak urinary stream? Less than half the time     How often have you had to strain to start urination? Not at All     How many times did you typically get up at night to urinate? 1 Time     Total IPSS Score 10       Quality of Life due to urinary symptoms   If you were to spend the rest of your life with your urinary condition just the way it is now how would you feel about that? Mixed        Score:  1-7 Mild 8-19 Moderate 20-35 Severe  Patient was also concerned about his fertility status. He states that he had never been able to father children and would like a semen analysis performed.  PMH: Past Medical History:  Diagnosis Date  . Anemia   . Anxiety   . Asthma   . Bilateral carpal tunnel syndrome   . Bilateral hand numbness   . BPH without urinary obstruction   . Cervical spondylosis without myelopathy   . Complete tear of rotator cuff    bilateral  . COPD (chronic obstructive pulmonary disease) ([place])   . Depression   . History of stomach ulcers   . HLD (hyperlipidemia)   . LVH (left ventricular hypertrophy)   . Personal history of tobacco use, presenting hazards to health 12/21/2015  . Poor dentition   . Sickle cell trait ([place])   . Sleep apnea   . Smoker     Surgical History: Past Surgical History:  Procedure Laterality Date  . COLONOSCOPY WITH PROPOFOL N/A 12/25/2015   Procedure: COLONOSCOPY WITH PROPOFOL;  Surgeon: Lucilla Lame, MD;  Location: [facility] ENDOSCOPY;  Service: Endoscopy;   Laterality: N/A;  . CYSTOURETHROSCOPY  08/25/12   with ureteral cathization w/wo retrograde pyelogram  . ROTATOR CUFF REPAIR Right 01/25/15  . SHOULDER ARTHROSCOPY WITH SUBACROMIAL DECOMPRESSION Right 032416  . TRANSURETHRAL RESECTION OF PROSTATE  08/25/12  Home Medications:  Allergies as of 02/19/2017      Reactions   Penicillin G Other (See Comments)      Medication List       Accurate as of 02/19/17  4:52 PM. Always use your most recent med list.          aspirin EC 81 MG tablet Take 1 tablet (81 mg total) by mouth daily.   atorvastatin 10 MG tablet Commonly known as:  LIPITOR Take 1 tablet (10 mg total) by mouth at bedtime.   buPROPion 150 MG 24 hr tablet Commonly known as:  WELLBUTRIN XL Take 1 tablet (150 mg total) by mouth daily.   cyanocobalamin 500 MCG tablet Take 1 tablet (500 mcg total) by mouth daily.   escitalopram 10 MG tablet Commonly known as:  LEXAPRO Take 1.5 tablets (15 mg total) by mouth daily.   finasteride 5 MG tablet Commonly known as:  PROSCAR Take 1 tablet (5 mg total) by mouth daily.   HYDROcodone-acetaminophen 5-325 MG tablet Commonly known as:  NORCO/VICODIN Take 1 tablet by mouth as needed.   ketoconazole 2 % cream Commonly known as:  NIZORAL Apply 1 application topically daily.   tamsulosin 0.4 MG Caps capsule Commonly known as:  FLOMAX Take 1 capsule (0.4 mg total) by mouth daily.       Allergies:  Allergies  Allergen Reactions  . Penicillin G Other (See Comments)    Family History: Family History  Problem Relation Age of Onset  . Aneurysm Mother   . Hypertension Mother   . Diabetes Father   . Hypertension Father   . Prostate cancer Father   . Cancer Maternal Aunt   . Cancer Maternal Uncle   . Diabetes Paternal Uncle   . Heart disease Cousin   . Prostate cancer Paternal Uncle   . COPD Neg Hx   . Stroke Neg Hx   . Kidney disease Neg Hx     Social History:  reports that he has been smoking Cigarettes.  He  has a 17.50 pack-year smoking history. He has never used smokeless tobacco. He reports that he does not drink alcohol or use drugs.  ROS: UROLOGY Frequent Urination?: No Hard to postpone urination?: No Burning/pain with urination?: No Get up at night to urinate?: No Leakage of urine?: No Urine stream starts and stops?: No Trouble starting stream?: No Do you have to strain to urinate?: No Blood in urine?: No Urinary tract infection?: No Sexually transmitted disease?: No Injury to kidneys or bladder?: No Painful intercourse?: No Weak stream?: No Erection problems?: No Penile pain?: No  Gastrointestinal Nausea?: No Vomiting?: No Indigestion/heartburn?: No Diarrhea?: No Constipation?: No  Constitutional Fever: No Night sweats?: No Weight loss?: No Fatigue?: No  Skin Skin rash/lesions?: No Itching?: No  Eyes Blurred vision?: No Double vision?: No  Ears/Nose/Throat Sore throat?: No Sinus problems?: No  Hematologic/Lymphatic Swollen glands?: No Easy bruising?: No  Cardiovascular Leg swelling?: No Chest pain?: No  Respiratory Cough?: No Shortness of breath?: No  Endocrine Excessive thirst?: No  Musculoskeletal Back pain?: No Joint pain?: No  Neurological Headaches?: No Dizziness?: No  Psychologic Depression?: No Anxiety?: No  Physical Exam: BP 120/78   Pulse 80   Ht 5\' 10"  (1.778 m)   Wt 165 lb 4.8 oz (75 kg)   BMI 23.72 kg/m   Constitutional: Well nourished. Alert and oriented, No acute distress. HEENT: [place] AT, moist mucus membranes. Trachea midline, no masses. Cardiovascular: No clubbing, cyanosis, or edema. Respiratory: Normal  respiratory effort, no increased work of breathing. GI: Abdomen is soft, non tender, non distended, no abdominal masses. Liver and spleen not palpable.  No hernias appreciated.  Stool sample for occult testing is not indicated.   GU: No CVA tenderness.  No bladder fullness or masses.  Patient with circumcised  phallus.  Urethral meatus is patent.  No penile discharge. No penile lesions or rashes. Scrotum without lesions, cysts, rashes and/or edema.  Testicles are located scrotally bilaterally. No masses are appreciated in the testicles. Left and right epididymis are normal. Rectal: Patient with  normal sphincter tone. Anus and perineum without scarring or rashes. No rectal masses are appreciated. Prostate is approximately 55 grams, no nodules are appreciated. Seminal vesicles are normal. Skin: No rashes, bruises or suspicious lesions. Lymph: No cervical or inguinal adenopathy. Neurologic: Grossly intact, no focal deficits, moving all 4 extremities. Psychiatric: Normal mood and affect.  Laboratory Data: Lab Results  Component Value Date   WBC 5.8 10/31/2016   HGB 13.3 10/31/2016   HCT 40.7 10/31/2016   MCV 75.4 (L) 10/31/2016   PLT 240 10/31/2016    Lab Results  Component Value Date   CREATININE 1.56 (H) 01/29/2017  PSA History  4.43 ng/mL on 12/01/2013  4.67 ng/mL on 08/28/2014  4.80 ng/mL on 11/02/2015  6.10 ng/mL on 02/04/2016  5.10 ng/mL on 02/19/2016  4.70 ng/mL on 05/08/2016  4.10 ng/mL on 01/05/2017   Results for orders placed or performed in visit on 01/29/17  COMPLETE METABOLIC PANEL WITH GFR  Result Value Ref Range   Sodium 140 135 - 146 mmol/L   Potassium 4.1 3.5 - 5.3 mmol/L   Chloride 103 98 - 110 mmol/L   CO2 25 20 - 31 mmol/L   Glucose, Bld 84 65 - 99 mg/dL   BUN 10 7 - 25 mg/dL   Creat 1.56 (H) 0.70 - 1.33 mg/dL   Total Bilirubin 0.3 0.2 - 1.2 mg/dL   Alkaline Phosphatase 60 40 - 115 U/L   AST 19 10 - 35 U/L   ALT 12 9 - 46 U/L   Total Protein 6.6 6.1 - 8.1 g/dL   Albumin 3.7 3.6 - 5.1 g/dL   Calcium 9.0 8.6 - 10.3 mg/dL   GFR, Est African American 56 (L) >=60 mL/min   GFR, Est Non African American 48 (L) >=60 mL/min  Lipid panel  Result Value Ref Range   Cholesterol 193 <200 mg/dL   Triglycerides 146 <150 mg/dL   HDL 44 >40 mg/dL   Total CHOL/HDL Ratio  4.4 <5.0 Ratio   VLDL 29 <30 mg/dL   LDL Cholesterol 120 (H) <100 mg/dL  Trichomonas vaginalis RNA, Ql,Males  Result Value Ref Range   Trichomonas vaginalis RNA Not Detected Not Detected    Assessment & Plan:    1.  Elevated PSA  - PSA is stable at 4  - RTC in 6 months for PSA and exam  2. History of gross hematuria  - patient does not endorse gross hematuria  - RTC in 6 months for recheck on UA  3. Erectile dysfunction  - SHIM score is 18, it is improving  - RTC in 6 months for repeat SHIM score and exam    4. BPH with LUTS  - IPSS score is 10/3, it is stable/improving/worsening  - Continue conservative management, avoiding bladder irritants and timed voiding's  - most bothersome symptoms is/are Incomplete emptying and frequency  - Continue tamsulosin 0.4 mg daily and finasteride 5 mg daily  -  RTC in 6 months for IPSS, PSA and exam   5. Infertility  - Explained to the patient that the medications tamsulosin and finasteride inhibit ejaculation, I asked if he was wanting to have children and he stated no he just wanted to see if he could father children.  I advised the patient not to have a semen analysis unless he was seriously considering having children and did advise him that with his prostate issues likelihood of him being able to father children was low.  He stated he wanted to pursue the semen analysis anyway, he stated it was "a man thing"  Return in about 6 months (around 08/21/2017) for IPSS, SHIM, PSA and exam.  These notes generated with voice recognition software. I apologize for typographical errors.  Zara Council, [facility] [address], [place], [place] [address] [phone]

## 2017-02-23 ENCOUNTER — Other Ambulatory Visit: Payer: Self-pay

## 2017-02-23 DIAGNOSIS — N183 Chronic kidney disease, stage 3 unspecified: Secondary | ICD-10-CM

## 2017-05-07 ENCOUNTER — Encounter: Payer: Self-pay | Admitting: Family Medicine

## 2017-05-07 ENCOUNTER — Ambulatory Visit (INDEPENDENT_AMBULATORY_CARE_PROVIDER_SITE_OTHER): Payer: Medicaid Other | Admitting: Family Medicine

## 2017-05-07 DIAGNOSIS — Z5181 Encounter for therapeutic drug level monitoring: Secondary | ICD-10-CM | POA: Diagnosis not present

## 2017-05-07 DIAGNOSIS — E782 Mixed hyperlipidemia: Secondary | ICD-10-CM | POA: Diagnosis not present

## 2017-05-07 DIAGNOSIS — F17200 Nicotine dependence, unspecified, uncomplicated: Secondary | ICD-10-CM | POA: Diagnosis not present

## 2017-05-07 DIAGNOSIS — F3341 Major depressive disorder, recurrent, in partial remission: Secondary | ICD-10-CM | POA: Diagnosis not present

## 2017-05-07 DIAGNOSIS — R972 Elevated prostate specific antigen [PSA]: Secondary | ICD-10-CM

## 2017-05-07 DIAGNOSIS — D573 Sickle-cell trait: Secondary | ICD-10-CM

## 2017-05-07 DIAGNOSIS — E559 Vitamin D deficiency, unspecified: Secondary | ICD-10-CM

## 2017-05-07 DIAGNOSIS — E538 Deficiency of other specified B group vitamins: Secondary | ICD-10-CM

## 2017-05-07 DIAGNOSIS — I517 Cardiomegaly: Secondary | ICD-10-CM | POA: Diagnosis not present

## 2017-05-07 DIAGNOSIS — N182 Chronic kidney disease, stage 2 (mild): Secondary | ICD-10-CM | POA: Diagnosis not present

## 2017-05-07 NOTE — Patient Instructions (Addendum)
Try to limit saturated fats in your diet (bologna, hot dogs, barbeque, cheeseburgers, hamburgers, steak, bacon, sausage, cheese, etc.) and get more fresh fruits, vegetables, and whole grains Return for fasting labs on or after September 30th I do encourage you to quit smoking Call [phone] to sign up for smoking cessation classes You can call 1-800-QUIT-NOW to talk with a smoking cessation coach  Steps to Quit Smoking Smoking tobacco can be bad for your health. It can also affect almost every organ in your body. Smoking puts you and people around you at risk for many serious long-lasting (chronic) diseases. Quitting smoking is hard, but it is one of the best things that you can do for your health. It is never too late to quit. What are the benefits of quitting smoking? When you quit smoking, you lower your risk for getting serious diseases and conditions. They can include:  Lung cancer or lung disease.  Heart disease.  Stroke.  Heart attack.  Not being able to have children (infertility).  Weak bones (osteoporosis) and broken bones (fractures).  If you have coughing, wheezing, and shortness of breath, those symptoms may get better when you quit. You may also get sick less often. If you are pregnant, quitting smoking can help to lower your chances of having a baby of low birth weight. What can I do to help me quit smoking? Talk with your doctor about what can help you quit smoking. Some things you can do (strategies) include:  Quitting smoking totally, instead of slowly cutting back how much you smoke over a period of time.  Going to in-person counseling. You are more likely to quit if you go to many counseling sessions.  Using resources and support systems, such as: ? Database administrator with a Social worker. ? Phone quitlines. ? Careers information officer. ? Support groups or group counseling. ? Text messaging programs. ? Mobile phone apps or applications.  Taking medicines. Some of  these medicines may have nicotine in them. If you are pregnant or breastfeeding, do not take any medicines to quit smoking unless your doctor says it is okay. Talk with your doctor about counseling or other things that can help you.  Talk with your doctor about using more than one strategy at the same time, such as taking medicines while you are also going to in-person counseling. This can help make quitting easier. What things can I do to make it easier to quit? Quitting smoking might feel very hard at first, but there is a lot that you can do to make it easier. Take these steps:  Talk to your family and friends. Ask them to support and encourage you.  Call phone quitlines, reach out to support groups, or work with a Social worker.  Ask people who smoke to not smoke around you.  Avoid places that make you want (trigger) to smoke, such as: ? Bars. ? Parties. ? Smoke-break areas at work.  Spend time with people who do not smoke.  Lower the stress in your life. Stress can make you want to smoke. Try these things to help your stress: ? Getting regular exercise. ? Deep-breathing exercises. ? Yoga. ? Meditating. ? Doing a body scan. To do this, close your eyes, focus on one area of your body at a time from head to toe, and notice which parts of your body are tense. Try to relax the muscles in those areas.  Download or buy apps on your mobile phone or tablet that can help you stick  to your quit plan. There are many free apps, such as QuitGuide from the [employer] Office manager for Disease Control and Prevention). You can find more support from smokefree.gov and other websites.  This information is not intended to replace advice given to you by your health care provider. Make sure you discuss any questions you have with your health care provider. Document Released: 08/16/2009 Document Revised: 06/17/2016 Document Reviewed: 03/06/2015 Elsevier Interactive Patient Education  2018 [employer].

## 2017-05-07 NOTE — Progress Notes (Signed)
BP 122/76   Pulse 69   Temp 98 F (36.7 C) (Oral)   Resp 16   Wt 161 lb 12.8 oz (73.4 kg)   SpO2 97%   BMI 23.22 kg/m    Subjective:    Patient ID: Manuel [name], male    DOB: [birth year], 61 y.o.   MRN: [id number]  HPI: Manuel [name] is a 61 y.o. male  Chief Complaint  Patient presents with  . Follow-up    3 month   HPI Here for f/u No medical excitement since last visit High cholesterol; eats out a lot; not much chicken, a lot of fast food; doesn't cook at home much Taking statin Vit B12 deficiency, still taking that vitamin Enlarged prostate, seeing Zara Council, last visit in April; urinating okay Mood is doing well on the medicine, but not taking too much He is down to smoking 1/2 ppd, maybe 1/3 ppd Quit the bupropion for the most part CKD; has not seen nephrologist; tyelnol prn and NO NSAIDs; not a good water drinker  Depression screen [facility] 2/9 05/07/2017 01/29/2017 10/31/2016 08/01/2016 06/02/2016  Decreased Interest 0 0 0 0 1  Down, Depressed, Hopeless 0 1 0 0 3  PHQ - 2 Score 0 1 0 0 4  Altered sleeping - - - - 3  Tired, decreased energy - - - - 3  Change in appetite - - - - 1  Feeling bad or failure about yourself  - - - - 1  Trouble concentrating - - - - 1  Moving slowly or fidgety/restless - - - - 1  Suicidal thoughts - - - - 0  PHQ-9 Score - - - - 14  Difficult doing work/chores - - - - Very difficult    Relevant past medical, surgical, family and social history reviewed Past Medical History:  Diagnosis Date  . Anemia   . Anxiety   . Asthma   . Bilateral carpal tunnel syndrome   . Bilateral hand numbness   . BPH without urinary obstruction   . Cervical spondylosis without myelopathy   . Complete tear of rotator cuff    bilateral  . COPD (chronic obstructive pulmonary disease) ([place])   . Depression   . History of stomach ulcers   . HLD (hyperlipidemia)   . LVH (left ventricular hypertrophy)   . Personal history of tobacco use, presenting  hazards to health 12/21/2015  . Poor dentition   . Sickle cell trait ([place])   . Sleep apnea   . Smoker    Past Surgical History:  Procedure Laterality Date  . COLONOSCOPY WITH PROPOFOL N/A 12/25/2015   Procedure: COLONOSCOPY WITH PROPOFOL;  Surgeon: Lucilla Lame, MD;  Location: [facility] ENDOSCOPY;  Service: Endoscopy;  Laterality: N/A;  . CYSTOURETHROSCOPY  08/25/12   with ureteral cathization w/wo retrograde pyelogram  . ROTATOR CUFF REPAIR Right 01/25/15  . SHOULDER ARTHROSCOPY WITH SUBACROMIAL DECOMPRESSION Right 032416  . TRANSURETHRAL RESECTION OF PROSTATE  08/25/12   Family History  Problem Relation Age of Onset  . Aneurysm Mother   . Hypertension Mother   . Diabetes Father   . Hypertension Father   . Prostate cancer Father   . Cancer Maternal Aunt   . Cancer Maternal Uncle   . Diabetes Paternal Uncle   . Heart disease Cousin   . Prostate cancer Paternal Uncle   . COPD Neg Hx   . Stroke Neg Hx   . Kidney disease Neg Hx    Social History  Social History  . Marital status: Married    Spouse name: N/A  . Number of children: N/A  . Years of education: N/A   Occupational History  . Not on file.   Social History Main Topics  . Smoking status: Current Every Day Smoker    Packs/day: 0.50    Years: 35.00    Types: Cigarettes  . Smokeless tobacco: Never Used  . Alcohol use No  . 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 Sexual activity: Yes   Other Topics Concern  . Not on file   Social History Narrative  . No narrative on file    Interim medical history since last visit reviewed. Allergies and medications reviewed  Review of Systems Per HPI unless specifically indicated above     Objective:    BP 122/76   Pulse 69   Temp 98 F (36.7 C) (Oral)   Resp 16   Wt 161 lb 12.8 oz (73.4 kg)   SpO2 97%   BMI 23.22 kg/m   Wt Readings from Last 3 Encounters:  05/07/17 161 lb 12.8 oz (73.4 kg)  02/19/17 165 lb 4.8 oz (75 kg)  01/29/17 168 lb 11.2  oz (76.5 kg)    Physical Exam  Constitutional: He appears well-developed and well-nourished. No distress.  HENT:  Mouth/Throat: Mucous membranes are normal. He has dentures.  Eyes: No scleral icterus.  Cardiovascular: Normal rate and regular rhythm.   Pulmonary/Chest: Effort normal and breath sounds normal.  Abdominal: Soft. He exhibits no distension.  Neurological: He is alert. He displays no tremor. Gait normal.  Skin: Skin is warm. No pallor.  Psychiatric: He has a normal mood and affect. His behavior is normal. Judgment and thought content normal.    Results for orders placed or performed in visit on 55/73/22  BASIC METABOLIC PANEL WITH GFR  Result Value Ref Range   Sodium 139 135 - 146 mmol/L   Potassium 3.9 3.5 - 5.3 mmol/L   Chloride 105 98 - 110 mmol/L   CO2 26 20 - 31 mmol/L   Glucose, Bld 89 65 - 99 mg/dL   BUN 10 7 - 25 mg/dL   Creat 1.46 (H) 0.70 - 1.33 mg/dL   Calcium 9.1 8.6 - 10.3 mg/dL   GFR, Est African American 60 >=60 mL/min   GFR, Est Non African American 52 (L) >=60 mL/min      Assessment & Plan:   Problem List Items Addressed This Visit      Cardiovascular and Mediastinum   LVH (left ventricular hypertrophy)    Keep BP controlled; adequate BP today        Genitourinary   Chronic kidney disease (CKD), stage II (mild)    Refer back to nephrolgoist; avoid NSAIDs      Relevant Orders   Ambulatory referral to Nephrology     Other   Vitamin D deficiency    Check level with other labs in late Sept      Relevant Orders   VITAMIN D 25 Hydroxy (Vit-D Deficiency, Fractures)   Vitamin B12 deficiency    Check level in late Sept      Relevant Orders   B12   Sickle cell trait (HCC)    Noted, stable      Medication monitoring encounter    Monitor renal function, liver function      Relevant Orders   CBC with Differential/Platelet   COMPLETE METABOLIC PANEL WITH GFR  Hyperlipidemia    Return for fasting labs in late Sept; limit saturated  fats      Relevant Orders   Lipid panel   Elevated PSA    Followed by urologist      Depression    Wellbutrin, which may also help with smoking cessation      Current smoker    Encouraged smoking cessation; more than 3 minutes spent counseling, see AVS          Follow up plan: Return in about 3 months (around 08/07/2017) for follow-up visit with Dr. Sanda Klein; have fasting labs done 08/02/17 or just after.  An after-visit summary was printed and given to the patient at [facility].  Please see the patient instructions which may contain other information and recommendations beyond what is mentioned above in the assessment and plan.  No orders of the defined types were placed in this encounter.   Orders Placed This Encounter  Procedures  . CBC with Differential/Platelet  . COMPLETE METABOLIC PANEL WITH GFR  . Lipid panel  . VITAMIN D 25 Hydroxy (Vit-D Deficiency, Fractures)  . B12  . Ambulatory referral to Nephrology

## 2017-05-07 NOTE — Assessment & Plan Note (Signed)
Refer back to nephrolgoist; avoid NSAIDs

## 2017-05-12 ENCOUNTER — Encounter: Payer: Self-pay | Admitting: Family Medicine

## 2017-05-12 NOTE — Assessment & Plan Note (Signed)
Monitor renal function, liver function

## 2017-05-12 NOTE — Assessment & Plan Note (Addendum)
Encouraged smoking cessation; more than 3 minutes spent counseling, see AVS

## 2017-05-12 NOTE — Assessment & Plan Note (Signed)
Keep BP controlled; adequate BP today

## 2017-05-12 NOTE — Assessment & Plan Note (Signed)
Check level in late Sept

## 2017-05-12 NOTE — Assessment & Plan Note (Signed)
Return for fasting labs in late Sept; limit saturated fats

## 2017-05-12 NOTE — Assessment & Plan Note (Signed)
Followed by urologist.

## 2017-05-12 NOTE — Assessment & Plan Note (Signed)
Check level with other labs in late Sept

## 2017-05-12 NOTE — Assessment & Plan Note (Signed)
Noted, stable. ?

## 2017-05-12 NOTE — Assessment & Plan Note (Signed)
Wellbutrin, which may also help with smoking cessation

## 2017-08-07 ENCOUNTER — Ambulatory Visit: Payer: Medicaid Other | Admitting: Family Medicine

## 2017-08-12 ENCOUNTER — Encounter: Payer: Self-pay | Admitting: Urology

## 2017-08-12 ENCOUNTER — Other Ambulatory Visit: Payer: Medicaid Other

## 2017-08-18 ENCOUNTER — Ambulatory Visit (INDEPENDENT_AMBULATORY_CARE_PROVIDER_SITE_OTHER): Payer: Medicaid Other | Admitting: Family Medicine

## 2017-08-18 ENCOUNTER — Encounter: Payer: Self-pay | Admitting: Family Medicine

## 2017-08-18 VITALS — BP 122/68 | HR 74 | Temp 97.7°F | Resp 14 | Wt 164.9 lb

## 2017-08-18 DIAGNOSIS — F17200 Nicotine dependence, unspecified, uncomplicated: Secondary | ICD-10-CM | POA: Diagnosis not present

## 2017-08-18 DIAGNOSIS — N182 Chronic kidney disease, stage 2 (mild): Secondary | ICD-10-CM | POA: Diagnosis not present

## 2017-08-18 DIAGNOSIS — D573 Sickle-cell trait: Secondary | ICD-10-CM

## 2017-08-18 DIAGNOSIS — R972 Elevated prostate specific antigen [PSA]: Secondary | ICD-10-CM

## 2017-08-18 DIAGNOSIS — E782 Mixed hyperlipidemia: Secondary | ICD-10-CM | POA: Diagnosis not present

## 2017-08-18 DIAGNOSIS — Z23 Encounter for immunization: Secondary | ICD-10-CM

## 2017-08-18 DIAGNOSIS — E538 Deficiency of other specified B group vitamins: Secondary | ICD-10-CM

## 2017-08-18 DIAGNOSIS — E559 Vitamin D deficiency, unspecified: Secondary | ICD-10-CM | POA: Diagnosis not present

## 2017-08-18 DIAGNOSIS — Z5181 Encounter for therapeutic drug level monitoring: Secondary | ICD-10-CM

## 2017-08-18 MED ORDER — ATORVASTATIN CALCIUM 10 MG PO TABS: 10.0000 mg | ORAL_TABLET | Freq: Every day | ORAL | 5 refills | Status: DC

## 2017-08-18 MED ORDER — SERTRALINE HCL 50 MG PO TABS: 50.0000 mg | ORAL_TABLET | Freq: Every day | ORAL | 3 refills | Status: DC

## 2017-08-18 MED ORDER — BUPROPION HCL ER (XL) 150 MG PO TB24: 150.0000 mg | ORAL_TABLET | Freq: Every day | ORAL | 5 refills | Status: DC

## 2017-08-18 NOTE — Patient Instructions (Addendum)
I do encourage you to quit smoking Call [phone] to sign up for smoking cessation classes You can call 1-800-QUIT-NOW to talk with a smoking cessation coach   Steps to Quit Smoking Smoking tobacco can be bad for your health. It can also affect almost every organ in your body. Smoking puts you and people around you at risk for many serious long-lasting (chronic) diseases. Quitting smoking is hard, but it is one of the best things that you can do for your health. It is never too late to quit. What are the benefits of quitting smoking? When you quit smoking, you lower your risk for getting serious diseases and conditions. They can include:  Lung cancer or lung disease.  Heart disease.  Stroke.  Heart attack.  Not being able to have children (infertility).  Weak bones (osteoporosis) and broken bones (fractures).  If you have coughing, wheezing, and shortness of breath, those symptoms may get better when you quit. You may also get sick less often. If you are pregnant, quitting smoking can help to lower your chances of having a baby of low birth weight. What can I do to help me quit smoking? Talk with your doctor about what can help you quit smoking. Some things you can do (strategies) include:  Quitting smoking totally, instead of slowly cutting back how much you smoke over a period of time.  Going to in-person counseling. You are more likely to quit if you go to many counseling sessions.  Using resources and support systems, such as: ? Online chats with a counselor. ? Phone quitlines. ? Printed self-help materials. ? Support groups or group counseling. ? Text messaging programs. ? Mobile phone apps or applications.  Taking medicines. Some of these medicines may have nicotine in them. If you are pregnant or breastfeeding, do not take any medicines to quit smoking unless your doctor says it is okay. Talk with your doctor about counseling or other things that can help you.  Talk  with your doctor about using more than one strategy at the same time, such as taking medicines while you are also going to in-person counseling. This can help make quitting easier. What things can I do to make it easier to quit? Quitting smoking might feel very hard at first, but there is a lot that you can do to make it easier. Take these steps:  Talk to your family and friends. Ask them to support and encourage you.  Call phone quitlines, reach out to support groups, or work with a counselor.  Ask people who smoke to not smoke around you.  Avoid places that make you want (trigger) to smoke, such as: ? Bars. ? Parties. ? Smoke-break areas at work.  Spend time with people who do not smoke.  Lower the stress in your life. Stress can make you want to smoke. Try these things to help your stress: ? Getting regular exercise. ? Deep-breathing exercises. ? Yoga. ? Meditating. ? Doing a body scan. To do this, close your eyes, focus on one area of your body at a time from head to toe, and notice which parts of your body are tense. Try to relax the muscles in those areas.  Download or buy apps on your mobile phone or tablet that can help you stick to your quit plan. There are many free apps, such as QuitGuide from the CDC (Centers for Disease Control and Prevention). You can find more support from smokefree.gov and other websites.  This information is not   intended to replace advice given to you by your health care provider. Make sure you discuss any questions you have with your health care provider. Document Released: 08/16/2009 Document Revised: 06/17/2016 Document Reviewed: 03/06/2015 Elsevier Interactive Patient Education  2018 Elsevier Inc.  

## 2017-08-18 NOTE — Progress Notes (Signed)
BP 122/68   Pulse 74   Temp 97.7 F (36.5 C) (Oral)   Resp 14   Wt 164 lb 14.4 oz (74.8 kg)   SpO2 97%   BMI 23.66 kg/m    Subjective:    Patient ID: Manuel [name], male    DOB: December 21, 1955, 61 y.o.   MRN: [id number]  HPI: Manuel [name] is a 61 y.o. male  Chief Complaint  Patient presents with  . Follow-up    HPI He is here for f/u His wife's traumatic brain injury is hard for him; he looks after her; he's tired Energy level is down; sleeping is not good because of her sleep schedule He is on bupropion and escitalopram for mood CKD stage 2; avoiding NSAIDs He has high cholesterol; was supposed to be taking cholesterol medicine, but stopped in July Lab Results  Component Value Date   CHOL 193 01/29/2017   HDL 44 01/29/2017   LDLCALC 120 (H) 01/29/2017   TRIG 146 01/29/2017   CHOLHDL 4.4 01/29/2017  He is smoking; smoking 1/2 ppd He sees urologist for prostate issues He has sickle cell trait His visit was abruptly stopped, as his wife was sent straight to the ER  Depression screen [facility] 2/9 08/18/2017 05/07/2017 01/29/2017 10/31/2016 08/01/2016  Decreased Interest 0 0 0 0 0  Down, Depressed, Hopeless 0 0 1 0 0  PHQ - 2 Score 0 0 1 0 0  Altered sleeping - - - - -  Tired, decreased energy - - - - -  Change in appetite - - - - -  Feeling bad or failure about yourself  - - - - -  Trouble concentrating - - - - -  Moving slowly or fidgety/restless - - - - -  Suicidal thoughts - - - - -  PHQ-9 Score - - - - -  Difficult doing work/chores - - - - -    Relevant past medical, surgical, family and social history reviewed Past Medical History:  Diagnosis Date  . Anemia   . Anxiety   . Asthma   . Bilateral carpal tunnel syndrome   . Bilateral hand numbness   . BPH without urinary obstruction   . Cervical spondylosis without myelopathy   . Complete tear of rotator cuff    bilateral  . COPD (chronic obstructive pulmonary disease) ([place])   . Depression   . History of  stomach ulcers   . HLD (hyperlipidemia)   . LVH (left ventricular hypertrophy)   . Personal history of tobacco use, presenting hazards to health 12/21/2015  . Poor dentition   . Sickle cell trait ([place])   . Sleep apnea   . Smoker    Past Surgical History:  Procedure Laterality Date  . COLONOSCOPY WITH PROPOFOL N/A 12/25/2015   Procedure: COLONOSCOPY WITH PROPOFOL;  Surgeon: Lucilla Lame, MD;  Location: [facility] ENDOSCOPY;  Service: Endoscopy;  Laterality: N/A;  . CYSTOURETHROSCOPY  08/25/12   with ureteral cathization w/wo retrograde pyelogram  . ROTATOR CUFF REPAIR Right 01/25/15  . SHOULDER ARTHROSCOPY WITH SUBACROMIAL DECOMPRESSION Right 032416  . TRANSURETHRAL RESECTION OF PROSTATE  08/25/12   Family History  Problem Relation Age of Onset  . Aneurysm Mother   . Hypertension Mother   . Diabetes Father   . Hypertension Father   . Prostate cancer Father   . Cancer Maternal Aunt   . Cancer Maternal Uncle   . Diabetes Paternal Uncle   . Heart disease Cousin   . Prostate  cancer Paternal Uncle   . COPD Neg Hx   . Stroke Neg Hx   . Kidney disease Neg Hx    Social History   Social History  . Marital status: Married    Spouse name: N/A  . Number of children: N/A  . Years of education: N/A   Occupational History  . Not on file.   Social History Main Topics  . Smoking status: Current Every Day Smoker    Packs/day: 0.50    Years: 35.00    Types: Cigarettes  . Smokeless tobacco: Never Used  . Alcohol use No  . 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 Sexual activity: Yes   Other Topics Concern  . Not on file   Social History Narrative  . No narrative on file    Interim medical history since last visit reviewed. Allergies and medications reviewed  Review of Systems Per HPI unless specifically indicated above     Objective:    BP 122/68   Pulse 74   Temp 97.7 F (36.5 C) (Oral)   Resp 14   Wt 164 lb 14.4 oz (74.8 kg)   SpO2 97%   BMI 23.66 kg/m    Wt Readings from Last 3 Encounters:  08/18/17 164 lb 14.4 oz (74.8 kg)  05/07/17 161 lb 12.8 oz (73.4 kg)  02/19/17 165 lb 4.8 oz (75 kg)    Physical Exam  Constitutional: He appears well-developed and well-nourished. No distress.  Eyes: No scleral icterus.  Cardiovascular: Normal rate and regular rhythm.   Pulmonary/Chest: Effort normal and breath sounds normal.  Neurological: He is alert.  Skin: No pallor.  Psychiatric: He has a normal mood and affect.      Assessment & Plan:   Problem List Items Addressed This Visit      Genitourinary   Chronic kidney disease (CKD), stage II (mild)    Monitor Cr; avoid NSAIDs        Other   Vitamin D deficiency    Check level, given patient's fatigue, and add additional supplement if needed      Relevant Orders   VITAMIN D 25 Hydroxy (Vit-D Deficiency, Fractures)   Vitamin B12 deficiency    Check level, given patient's fatigue, and add additional supplement if needed      Relevant Orders   B12   Sickle cell trait (HCC)    Noted, unchanged      Relevant Orders   CBC with Differential/Platelet   Medication monitoring encounter    Monitor liver and kidneys      Relevant Orders   CBC with Differential/Platelet   COMPLETE METABOLIC PANEL WITH GFR   Hyperlipidemia - Primary    Patient quit taking statin on his own; he will return for labs in the near future; appt was stopped, as his wife had to go to the ER      Relevant Medications   atorvastatin (LIPITOR) 10 MG tablet   Other Relevant Orders   Lipid panel   Elevated PSA    Monitored by urologist      Current smoker    Continues to smoke; I am here to help if/when he wants to quit; bupropion should help       Other Visit Diagnoses    Needs flu shot           Follow up plan: No Follow-up on file.  An after-visit summary was printed and given to the patient  at [place].  Please see the patient instructions which may contain other information and  recommendations beyond what is mentioned above in the assessment and plan.  Meds ordered this encounter  Medications  . atorvastatin (LIPITOR) 10 MG tablet    Sig: Take 1 tablet (10 mg total) by mouth at bedtime.    Dispense:  30 tablet    Refill:  5  . sertraline (ZOLOFT) 50 MG tablet    Sig: Take 1 tablet (50 mg total) by mouth daily.    Dispense:  30 tablet    Refill:  3  . buPROPion (WELLBUTRIN XL) 150 MG 24 hr tablet    Sig: Take 1 tablet (150 mg total) by mouth daily.    Dispense:  30 tablet    Refill:  5    Orders Placed This Encounter  Procedures  . CBC with Differential/Platelet  . COMPLETE METABOLIC PANEL WITH GFR  . Lipid panel  . VITAMIN D 25 Hydroxy (Vit-D Deficiency, Fractures)  . B12

## 2017-08-20 ENCOUNTER — Ambulatory Visit: Payer: Medicaid Other | Admitting: Urology

## 2017-08-21 ENCOUNTER — Other Ambulatory Visit: Payer: Medicaid Other

## 2017-08-21 DIAGNOSIS — R972 Elevated prostate specific antigen [PSA]: Secondary | ICD-10-CM

## 2017-08-22 LAB — PSA: Prostate Specific Ag, Serum: 4.8 ng/mL — ABNORMAL HIGH (ref 0.0–4.0)

## 2017-08-24 ENCOUNTER — Ambulatory Visit (INDEPENDENT_AMBULATORY_CARE_PROVIDER_SITE_OTHER): Payer: Medicaid Other | Admitting: Family Medicine

## 2017-08-24 ENCOUNTER — Encounter: Payer: Self-pay | Admitting: Family Medicine

## 2017-08-24 VITALS — BP 118/80 | HR 76 | Temp 97.8°F | Resp 16 | Wt 163.5 lb

## 2017-08-24 DIAGNOSIS — E559 Vitamin D deficiency, unspecified: Secondary | ICD-10-CM

## 2017-08-24 DIAGNOSIS — Z23 Encounter for immunization: Secondary | ICD-10-CM

## 2017-08-24 DIAGNOSIS — E538 Deficiency of other specified B group vitamins: Secondary | ICD-10-CM

## 2017-08-24 DIAGNOSIS — Z5181 Encounter for therapeutic drug level monitoring: Secondary | ICD-10-CM | POA: Diagnosis not present

## 2017-08-24 DIAGNOSIS — E782 Mixed hyperlipidemia: Secondary | ICD-10-CM

## 2017-08-24 DIAGNOSIS — D573 Sickle-cell trait: Secondary | ICD-10-CM | POA: Diagnosis not present

## 2017-08-24 DIAGNOSIS — R972 Elevated prostate specific antigen [PSA]: Secondary | ICD-10-CM

## 2017-08-24 NOTE — Assessment & Plan Note (Signed)
stable °

## 2017-08-24 NOTE — Assessment & Plan Note (Signed)
Patient quit taking statin on his own; he will return for labs in the near future; appt was stopped, as his wife had to go to the ER

## 2017-08-24 NOTE — Assessment & Plan Note (Signed)
Check level, given patient's fatigue, and add additional supplement if needed

## 2017-08-24 NOTE — Assessment & Plan Note (Signed)
Monitor Cr; avoid NSAIDs 

## 2017-08-24 NOTE — Assessment & Plan Note (Signed)
Monitored by urologist 

## 2017-08-24 NOTE — Assessment & Plan Note (Signed)
Managed by urologist 

## 2017-08-24 NOTE — Assessment & Plan Note (Signed)
Noted, unchanged

## 2017-08-24 NOTE — Assessment & Plan Note (Signed)
Check labs today, replace if indicated

## 2017-08-24 NOTE — Progress Notes (Signed)
BP 118/80   Pulse 76   Temp 97.8 F (36.6 C) (Oral)   Resp 16   Wt 163 lb 8 oz (74.2 kg)   SpO2 98%   BMI 23.46 kg/m    Subjective:    Patient ID: Manuel [name], male    DOB: [birth year], 61 y.o.   MRN: [id number]  HPI: Manuel [name] is a 61 y.o. male  Chief Complaint  Patient presents with  . Follow-up    HPI Patient here with his wife They have a cardiology appt to go to at 4 pm so we're respectful of the time until they need to leave today Here for flu shot High cholesterol; on statin He is here for labs today; see last visit when they were ordered but patient had to leave abruptly Low energy was a concern at the last visit, so we're drawing labs today Still smoking Seeing urologist for prostate issues; he says this is very important to him and he will follow up to see his specialist; he is not taking any chances  Depression screen [facility] 2/9 08/24/2017 08/18/2017 05/07/2017 01/29/2017 10/31/2016  Decreased Interest 0 0 0 0 0  Down, Depressed, Hopeless 0 0 0 1 0  PHQ - 2 Score 0 0 0 1 0  Altered sleeping - - - - -  Tired, decreased energy - - - - -  Change in appetite - - - - -  Feeling bad or failure about yourself  - - - - -  Trouble concentrating - - - - -  Moving slowly or fidgety/restless - - - - -  Suicidal thoughts - - - - -  PHQ-9 Score - - - - -  Difficult doing work/chores - - - - -    Relevant past medical, surgical, family and social history reviewed Past Medical History:  Diagnosis Date  . Anemia   . Anxiety   . Asthma   . Bilateral carpal tunnel syndrome   . Bilateral hand numbness   . BPH without urinary obstruction   . Cervical spondylosis without myelopathy   . Complete tear of rotator cuff    bilateral  . COPD (chronic obstructive pulmonary disease) ([place])   . Depression   . History of stomach ulcers   . HLD (hyperlipidemia)   . LVH (left ventricular hypertrophy)   . Personal history of tobacco use, presenting hazards to health  12/21/2015  . Poor dentition   . Sickle cell trait ([place])   . Sleep apnea   . Smoker    Past Surgical History:  Procedure Laterality Date  . COLONOSCOPY WITH PROPOFOL N/A 12/25/2015   Procedure: COLONOSCOPY WITH PROPOFOL;  Surgeon: Lucilla Lame, MD;  Location: [facility] ENDOSCOPY;  Service: Endoscopy;  Laterality: N/A;  . CYSTOURETHROSCOPY  08/25/12   with ureteral cathization w/wo retrograde pyelogram  . ROTATOR CUFF REPAIR Right 01/25/15  . SHOULDER ARTHROSCOPY WITH SUBACROMIAL DECOMPRESSION Right 032416  . TRANSURETHRAL RESECTION OF PROSTATE  08/25/12   Family History  Problem Relation Age of Onset  . Aneurysm Mother   . Hypertension Mother   . Diabetes Father   . Hypertension Father   . Prostate cancer Father   . Cancer Maternal Aunt   . Cancer Maternal Uncle   . Diabetes Paternal Uncle   . Heart disease Cousin   . Prostate cancer Paternal Uncle   . COPD Neg Hx   . Stroke Neg Hx   . Kidney disease Neg Hx    Social  History   Social History  . Marital status: Married    Spouse name: N/A  . Number of children: N/A  . Years of education: N/A   Occupational History  . Not on file.   Social History Main Topics  . Smoking status: Current Every Day Smoker    Packs/day: 0.50    Years: 35.00    Types: Cigarettes  . Smokeless tobacco: Never Used  . Alcohol use No  . 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 Sexual activity: Yes   Other Topics Concern  . Not on file   Social History Narrative  . No narrative on file    Interim medical history since last visit reviewed. Allergies and medications reviewed  Review of Systems Per HPI unless specifically indicated above     Objective:    BP 118/80   Pulse 76   Temp 97.8 F (36.6 C) (Oral)   Resp 16   Wt 163 lb 8 oz (74.2 kg)   SpO2 98%   BMI 23.46 kg/m   Wt Readings from Last 3 Encounters:  08/24/17 163 lb 8 oz (74.2 kg)  08/18/17 164 lb 14.4 oz (74.8 kg)  05/07/17 161 lb 12.8 oz (73.4 kg)      Physical Exam  Constitutional: He appears well-developed and well-nourished. No distress.  Eyes: No scleral icterus.  Cardiovascular: Normal rate and regular rhythm.   Pulmonary/Chest: Effort normal and breath sounds normal.  Abdominal: He exhibits no distension.  Neurological: He is alert.  Skin: No pallor.  Psychiatric: He has a normal mood and affect.    Results for orders placed or performed in visit on 08/21/17  PSA  Result Value Ref Range   Prostate Specific Ag, Serum 4.8 (H) 0.0 - 4.0 ng/mL      Assessment & Plan:   Problem List Items Addressed This Visit      Other   Vitamin D deficiency    Check labs today, replace if indicated      Vitamin B12 deficiency    Check labs today, replace if indicated      Sickle cell trait ([place])    stable      Medication monitoring encounter   Hyperlipidemia - Primary   Elevated PSA    Managed by urologist       Other Visit Diagnoses    Needs flu shot       Relevant Orders   Flu Vaccine QUAD 6+ mos PF IM (Fluarix Quad PF) (Completed)       Follow up plan: Return in about 6 months (around 02/22/2018) for twenty minute follow-up with fasting labs.  An after-visit summary was printed and given to the patient at [place].  Please see the patient instructions which may contain other information and recommendations beyond what is mentioned above in the assessment and plan.  No orders of the defined types were placed in this encounter.   Orders Placed This Encounter  Procedures  . Flu Vaccine QUAD 6+ mos PF IM (Fluarix Quad PF)

## 2017-08-24 NOTE — Assessment & Plan Note (Signed)
Continues to smoke; I am here to help if/when he wants to quit; bupropion should help

## 2017-08-24 NOTE — Patient Instructions (Addendum)
You received the flu shot today; it should protect you against the flu virus over the coming months; it will take about two weeks for antibodies to develop; do try to stay away from hospitals, nursing homes, and daycares during peak flu season; taking extra vitamin C daily during flu season may help you avoid getting sick We'll contact you about the lab results  Try to limit saturated fats in your diet (bologna, hot dogs, barbeque, cheeseburgers, hamburgers, steak, bacon, sausage, cheese, etc.) and get more fresh fruits, vegetables, and whole grains  I do encourage you to quit smoking Call [phone] to sign up for smoking cessation classes You can call 1-800-QUIT-NOW to talk with a smoking cessation coach  Health Risks of Smoking Smoking cigarettes is very bad for your health. Tobacco smoke has over 200 known poisons in it. It contains the poisonous gases nitrogen oxide and carbon monoxide. There are over 60 chemicals in tobacco smoke that cause cancer. Smoking is difficult to quit because a chemical in tobacco, called nicotine, causes addiction or dependence. When you smoke and inhale, nicotine is absorbed rapidly into the bloodstream through your lungs. Both inhaled and non-inhaled nicotine may be addictive. What are the risks of cigarette smoke? Cigarette smokers have an increased risk of many serious medical problems, including:  Lung cancer.  Lung disease, such as pneumonia, bronchitis, and emphysema.  Chest pain (angina) and heart attack because the heart is not getting enough oxygen.  Heart disease and peripheral blood vessel disease.  High blood pressure (hypertension).  Stroke.  Oral cancer, including cancer of the lip, mouth, or voice box.  Bladder cancer.  Pancreatic cancer.  Cervical cancer.  Pregnancy complications, including premature birth.  Stillbirths and smaller newborn babies, birth defects, and genetic damage to sperm.  Early menopause.  Lower estrogen  level for women.  Infertility.  Facial wrinkles.  Blindness.  Increased risk of broken bones (fractures).  Senile dementia.  Stomach ulcers and internal bleeding.  Delayed wound healing and increased risk of complications during surgery.  Even smoking lightly shortens your life expectancy by several years.  Because of secondhand smoke exposure, children of smokers have an increased risk of the following:  Sudden infant death syndrome (SIDS).  Respiratory infections.  Lung cancer.  Heart disease.  Ear infections.  What are the benefits of quitting? There are many health benefits of quitting smoking. Here are some of them:  Within days of quitting smoking, your risk of having a heart attack decreases, your blood flow improves, and your lung capacity improves. Blood pressure, pulse rate, and breathing patterns start returning to normal soon after quitting.  Within months, your lungs may clear up completely.  Quitting for 10 years reduces your risk of developing lung cancer and heart disease to almost that of a nonsmoker.  People who quit may see an improvement in their overall quality of life.  How do I quit smoking? Smoking is an addiction with both physical and psychological effects, and longtime habits can be hard to change. Your health care provider can recommend:  Programs and community resources, which may include group support, education, or talk therapy.  Prescription medicines to help reduce cravings.  Nicotine replacement products, such as patches, gum, and nasal sprays. Use these products only as directed. Do not replace cigarette smoking with electronic cigarettes, which are commonly called e-cigarettes. The safety of e-cigarettes is not known, and some may contain harmful chemicals.  A combination of two or more of these methods.  Where  to find more information:  American Lung Association: www.lung.org  American Cancer Society:  www.cancer.org Summary  Smoking cigarettes is very bad for your health. Cigarette smokers have an increased risk of many serious medical problems, including several cancers, heart disease, and stroke.  Smoking is an addiction with both physical and psychological effects, and longtime habits can be hard to change.  By stopping right away, you can greatly reduce the risk of medical problems for you and your family.  To help you quit smoking, your health care provider can recommend programs, community resources, prescription medicines, and nicotine replacement products such as patches, gum, and nasal sprays. This information is not intended to replace advice given to you by your health care provider. Make sure you discuss any questions you have with your health care provider. Document Released: 11/27/2004 Document Revised: 10/24/2016 Document Reviewed: 10/24/2016 Elsevier Interactive Patient Education  2017 [employer].

## 2017-08-24 NOTE — Assessment & Plan Note (Signed)
Monitor liver and kidneys 

## 2017-08-25 ENCOUNTER — Telehealth: Payer: Self-pay | Admitting: Family Medicine

## 2017-08-25 DIAGNOSIS — N182 Chronic kidney disease, stage 2 (mild): Secondary | ICD-10-CM

## 2017-08-25 DIAGNOSIS — Z5181 Encounter for therapeutic drug level monitoring: Secondary | ICD-10-CM

## 2017-08-25 DIAGNOSIS — E782 Mixed hyperlipidemia: Secondary | ICD-10-CM

## 2017-08-25 LAB — COMPLETE METABOLIC PANEL WITH GFR
AG Ratio: 1.4 (calc) (ref 1.0–2.5)
ALBUMIN MSPROF: 4 g/dL (ref 3.6–5.1)
ALT: 11 U/L (ref 9–46)
AST: 16 U/L (ref 10–35)
Alkaline phosphatase (APISO): 73 U/L (ref 40–115)
BUN / CREAT RATIO: 7 (calc) (ref 6–22)
BUN: 12 mg/dL (ref 7–25)
CALCIUM: 9.2 mg/dL (ref 8.6–10.3)
CO2: 32 mmol/L (ref 20–32)
CREATININE: 1.61 mg/dL — AB (ref 0.70–1.33)
Chloride: 101 mmol/L (ref 98–110)
GFR, EST NON AFRICAN AMERICAN: 46 mL/min/{1.73_m2} — AB (ref >=60)
GFR, Est African American: 53 mL/min/{1.73_m2} — ABNORMAL LOW (ref >=60)
GLOBULIN: 2.8 g/dL (ref 1.9–3.7)
Glucose, Bld: 83 mg/dL (ref 65–139)
Potassium: 4.3 mmol/L (ref 3.5–5.3)
SODIUM: 138 mmol/L (ref 135–146)
Total Bilirubin: 0.3 mg/dL (ref 0.2–1.2)
Total Protein: 6.8 g/dL (ref 6.1–8.1)

## 2017-08-25 LAB — CBC WITH DIFFERENTIAL/PLATELET
BASOS ABS: 29 {cells}/uL (ref 0–200)
BASOS PCT: 0.5 %
EOS PCT: 3.7 %
Eosinophils Absolute: 211 cells/uL (ref 15–500)
HEMATOCRIT: 40.4 % (ref 38.5–50.0)
HEMOGLOBIN: 13.4 g/dL (ref 13.2–17.1)
LYMPHS ABS: 1967 {cells}/uL (ref 850–3900)
MCH: 25.1 pg — ABNORMAL LOW (ref 27.0–33.0)
MCHC: 33.2 g/dL (ref 32.0–36.0)
MCV: 75.7 fL — ABNORMAL LOW (ref 80.0–100.0)
MPV: 9.7 fL (ref 7.5–12.5)
Monocytes Relative: 7.8 %
NEUTROS ABS: 3050 {cells}/uL (ref 1500–7800)
Neutrophils Relative %: 53.5 %
Platelets: 259 10*3/uL (ref 140–400)
RBC: 5.34 10*6/uL (ref 4.20–5.80)
RDW: 14.2 % (ref 11.0–15.0)
Total Lymphocyte: 34.5 %
WBC mixed population: 445 cells/uL (ref 200–950)
WBC: 5.7 10*3/uL (ref 3.8–10.8)

## 2017-08-25 LAB — VITAMIN B12: VITAMIN B 12: 276 pg/mL (ref 200–1100)

## 2017-08-25 LAB — LIPID PANEL
CHOL/HDL RATIO: 3.6 (calc) (ref <5.0)
Cholesterol: 171 mg/dL (ref <200)
HDL: 47 mg/dL (ref >40)
LDL CHOLESTEROL (CALC): 101 mg/dL — AB
NON-HDL CHOLESTEROL (CALC): 124 mg/dL (ref <130)
Triglycerides: 132 mg/dL (ref <150)

## 2017-08-25 LAB — VITAMIN D 25 HYDROXY (VIT D DEFICIENCY, FRACTURES): Vit D, 25-Hydroxy: 26 ng/mL — ABNORMAL LOW (ref 30–100)

## 2017-08-25 MED ORDER — ATORVASTATIN CALCIUM 20 MG PO TABS: 20.0000 mg | ORAL_TABLET | Freq: Every day | ORAL | 1 refills | Status: DC

## 2017-08-25 NOTE — Telephone Encounter (Signed)
I left message for the patient to call back for his lab results; kidney function declined, so stay hydrated and avoid NSAIDs ------------------------ When he calls, please let him know: 1. His vitamin D level is low; take 1,000 iu of vitamin D3 once a day 2.  His vitamin B12 is low; take 1,000 mcg of vitamin B12 once a day 3.  LDL is still a little above target; I'm increasing his atorvastatin from 10 mg to 20 mg at night; new Rx already sent; please have him return for FASTING labs in 6-8 weeks 4.  His kidney function has declined; avoid NSAIDs and stay well-hydrated; I referred him in April and again in July to see a kidney specialist; please urge him to see the kidney doctor ASAP

## 2017-08-25 NOTE — Progress Notes (Signed)
2:46 PM   Manuel [name] [birth year] [id number]  Referring provider: Arnetha Courser, MD [address] [place], [place] [address]  Chief Complaint  Patient presents with  . Follow-up    45mo  . Benign Prostatic Hypertrophy    HPI: Patient is a 61 year old African-American male with presents with an elevated PSA, a history of gross hematuria, erectile dysfunction and BPH with LUTS who presents today for a 6 months follow up.    Elevated PSA Patient's PSA has been variable over the last two years ranging from 4 to 6.  He has been inconsistent in taking his finasteride due to erectile dysfunction.  Because of this, it is difficult to be sure what his true PSA value is at this time.  His free and total PSA noted a 24% probability of having prostate cancer.  PCPT risk notes a 25% chance of a high grade cancer, 32% low grade cancer and a 43% chance of no cancer found on biopsy.  His father and paternal grandfather had been diagnosed with prostate cancer.  He underwent a TRUSPBx of prostate on 10/07/2016 with Dr. Noah Delaine.  Findings were 51.7 gram prostate and pathology was negative for malignancy.  PSA was 4.8 ng/mL in 08/2017.      History of gross hematuria Patient had an episode of clot retention in 2013.  He underwent a CT Urogram, cystoscopy with bilateral retrogrades and ultimately a TURP with Dr. Darcus Austin at [facility].  No malignancies were discovered.  Hematuria was from his enlarged, friable prostate.  He has not had any gross hematuria since that time.  A too small to characterize lesion was seen in the left midpole of the kidney on the CT Urogram in 2013.  Renal ultrasound completed on 02/14/2016 noted no renal masses, no hydronephrosis and an enlarged prostate.  He denies any gross hematuria.  His UA today is negative.    Erectile dysfunction His SHIM score is 18, which is mild erectile dysfunction.   His previous SHIM score was 18.  He has been having difficulty with  erections since starting the finasteride.   His major complaint is now having the confidence to achieve an erections.  His libido is preserved.   His risk factors for ED are hyperlipidemia, depression, age, BPH and smoking.  He denies any painful erections or curvatures with his erections.   He states that his wife has bi-polar disease and has left him at this time.       SHIM    Row Name 08/27/17 1437         SHIM: Over the last 6 months:   How do you rate your confidence that you could get and keep an erection? Moderate     When you had erections with sexual stimulation, how often were your erections hard enough for penetration (entering your partner)? Sometimes (about half the time)     During sexual intercourse, how often were you able to maintain your erection after you had penetrated (entered) your partner? Sometimes (about half the time)     During sexual intercourse, how difficult was it to maintain your erection to completion of intercourse? Not Difficult     When you attempted sexual intercourse, how often was it satisfactory for you? Most Times (much more than half the time)       SHIM Total Score   SHIM 18        Score: 1-7 Severe ED 8-11 Moderate ED 12-16  Mild-Moderate ED 17-21 Mild ED 22-25 No ED   BPH WITH LUTS His IPSS score today is 5, which is mild lower urinary tract symptomatology. He is pleased with his quality life due to his urinary symptoms.  His previous PVR was 22 mL. His previous I PSS score was 10/3.  His major complaint today a weak urinary stream.  He has had these symptoms for over three years.  He doesn't do that he does not drink a lot of fluids during the day.   He has been taking his tamsulosin and finasteride daily.   He denies any dysuria, hematuria or suprapubic pain.  His has had a TURP.  He also denies any recent fevers, chills, nausea or vomiting.   He has a family history of PCa, with father and paternal grand father with prostate cancer.          IPSS    Row Name 08/27/17 1400         International Prostate Symptom Score   How often have you had the sensation of not emptying your bladder? Less than 1 in 5     How often have you had to urinate less than every two hours? Less than 1 in 5 times     How often have you found you stopped and started again several times when you urinated? Less than 1 in 5 times     How often have you found it difficult to postpone urination? Not at All     How often have you had a weak urinary stream? Less than 1 in 5 times     How often have you had to strain to start urination? Not at All     How many times did you typically get up at night to urinate? 1 Time     Total IPSS Score 5       Quality of Life due to urinary symptoms   If you were to spend the rest of your life with your urinary condition just the way it is now how would you feel about that? Pleased        Score:  1-7 Mild 8-19 Moderate 20-35 Severe    PMH: Past Medical History:  Diagnosis Date  . Anemia   . Anxiety   . Asthma   . Bilateral carpal tunnel syndrome   . Bilateral hand numbness   . BPH without urinary obstruction   . Cervical spondylosis without myelopathy   . Complete tear of rotator cuff    bilateral  . COPD (chronic obstructive pulmonary disease) ([place])   . Depression   . History of stomach ulcers   . HLD (hyperlipidemia)   . LVH (left ventricular hypertrophy)   . Personal history of tobacco use, presenting hazards to health 12/21/2015  . Poor dentition   . Sickle cell trait ([place])   . Sleep apnea   . Smoker     Surgical History: Past Surgical History:  Procedure Laterality Date  . COLONOSCOPY WITH PROPOFOL N/A 12/25/2015   Procedure: COLONOSCOPY WITH PROPOFOL;  Surgeon: Lucilla Lame, MD;  Location: [facility];  Service: Endoscopy;  Laterality: N/A;  . CYSTOURETHROSCOPY  08/25/12   with ureteral cathization w/wo retrograde pyelogram  . ROTATOR CUFF REPAIR Right 01/25/15  . SHOULDER ARTHROSCOPY  WITH SUBACROMIAL DECOMPRESSION Right 032416  . TRANSURETHRAL RESECTION OF PROSTATE  08/25/12    Home Medications:  Allergies as of 08/27/2017      Reactions   Penicillin G Other (See Comments)  Medication List       Accurate as of 08/27/17  2:46 PM. Always use your most recent med list.          aspirin EC 81 MG tablet Take 1 tablet (81 mg total) by mouth daily.   atorvastatin 20 MG tablet Commonly known as:  LIPITOR Take 1 tablet (20 mg total) by mouth at bedtime.   buPROPion 150 MG 24 hr tablet Commonly known as:  WELLBUTRIN XL Take 1 tablet (150 mg total) by mouth daily.   cyanocobalamin 500 MCG tablet Take 1 tablet (500 mcg total) by mouth daily.   finasteride 5 MG tablet Commonly known as:  PROSCAR Take 1 tablet (5 mg total) by mouth daily.   sertraline 50 MG tablet Commonly known as:  ZOLOFT Take 1 tablet (50 mg total) by mouth daily.   tamsulosin 0.4 MG Caps capsule Commonly known as:  FLOMAX Take 1 capsule (0.4 mg total) by mouth daily.       Allergies:  Allergies  Allergen Reactions  . Penicillin G Other (See Comments)    Family History: Family History  Problem Relation Age of Onset  . Aneurysm Mother   . Hypertension Mother   . Diabetes Father   . Hypertension Father   . Prostate cancer Father   . Cancer Maternal Aunt   . Cancer Maternal Uncle   . Diabetes Paternal Uncle   . Heart disease Cousin   . Prostate cancer Paternal Uncle   . COPD Neg Hx   . Stroke Neg Hx   . Kidney disease Neg Hx     Social History:  reports that he has been smoking Cigarettes.  He has a 17.50 pack-year smoking history. He has never used smokeless tobacco. He reports that he does not drink alcohol or use drugs.  ROS: UROLOGY Frequent Urination?: No Hard to postpone urination?: No Burning/pain with urination?: No Get up at night to urinate?: No Leakage of urine?: No Urine stream starts and stops?: No Trouble starting stream?: No Do you have to  strain to urinate?: No Blood in urine?: No Urinary tract infection?: No Sexually transmitted disease?: No Injury to kidneys or bladder?: No Painful intercourse?: No Weak stream?: Yes Erection problems?: No Penile pain?: No  Gastrointestinal Nausea?: No Vomiting?: No Indigestion/heartburn?: No Diarrhea?: No Constipation?: No  Constitutional Fever: No Night sweats?: No Weight loss?: No Fatigue?: No  Skin Skin rash/lesions?: No Itching?: No  Eyes Blurred vision?: Yes Double vision?: No  Ears/Nose/Throat Sore throat?: No Sinus problems?: No  Hematologic/Lymphatic Swollen glands?: No Easy bruising?: No  Cardiovascular Leg swelling?: Yes Chest pain?: No  Respiratory Cough?: Yes Shortness of breath?: No  Endocrine Excessive thirst?: No  Musculoskeletal Back pain?: Yes Joint pain?: Yes  Neurological Headaches?: Yes Dizziness?: No  Psychologic Depression?: Yes Anxiety?: No  Physical Exam: BP 122/76   Pulse 80   Ht 5\' 10"  (1.778 m)   Wt 165 lb (74.8 kg)   BMI 23.68 kg/m   Constitutional: Well nourished. Alert and oriented, No acute distress. HEENT: [place] AT, moist mucus membranes. Trachea midline, no masses. Cardiovascular: No clubbing, cyanosis, or edema. Respiratory: Normal respiratory effort, no increased work of breathing. GI: Abdomen is soft, non tender, non distended, no abdominal masses. Liver and spleen not palpable.  No hernias appreciated.  Stool sample for occult testing is not indicated.   GU: No CVA tenderness.  No bladder fullness or masses.  Patient with circumcised phallus.  Urethral meatus is patent.  No penile  discharge. No penile lesions or rashes. Scrotum without lesions, cysts, rashes and/or edema.  Testicles are located scrotally bilaterally. No masses are appreciated in the testicles. Left and right epididymis are normal. Rectal: Patient with  normal sphincter tone. Anus and perineum without scarring or rashes. No rectal masses  are appreciated. Prostate is approximately 55 grams, no nodules are appreciated. Seminal vesicles are normal. Skin: No rashes, bruises or suspicious lesions. Lymph: No cervical or inguinal adenopathy. Neurologic: Grossly intact, no focal deficits, moving all 4 extremities. Psychiatric: Normal mood and affect.  Laboratory Data: Lab Results  Component Value Date   WBC 5.7 08/24/2017   HGB 13.4 08/24/2017   HCT 40.4 08/24/2017   MCV 75.7 (L) 08/24/2017   PLT 259 08/24/2017    Lab Results  Component Value Date   CREATININE 1.61 (H) 08/24/2017  PSA History  4.43 ng/mL on 12/01/2013  4.67 ng/mL on 08/28/2014  4.80 ng/mL on 11/02/2015  6.10 ng/mL on 02/04/2016  5.10 ng/mL on 02/19/2016  4.70 ng/mL on 05/08/2016  4.10 ng/mL on 01/05/2017  4.8 ng/mL in 08/2017   Results for orders placed or performed in visit on 08/24/17  CBC with Differential/Platelet  Result Value Ref Range   WBC 5.7 3.8 - 10.8 Thousand/uL   RBC 5.34 4.20 - 5.80 Million/uL   Hemoglobin 13.4 13.2 - 17.1 g/dL   HCT 40.4 38.5 - 50.0 %   MCV 75.7 (L) 80.0 - 100.0 fL   MCH 25.1 (L) 27.0 - 33.0 pg   MCHC 33.2 32.0 - 36.0 g/dL   RDW 14.2 11.0 - 15.0 %   Platelets 259 140 - 400 Thousand/uL   MPV 9.7 7.5 - 12.5 fL   Neutro Abs 3,050 1,500 - 7,800 cells/uL   Lymphs Abs 1,967 850 - 3,900 cells/uL   WBC mixed population 445 200 - 950 cells/uL   Eosinophils Absolute 211 15 - 500 cells/uL   Basophils Absolute 29 0 - 200 cells/uL   Neutrophils Relative % 53.5 %   Total Lymphocyte 34.5 %   Monocytes Relative 7.8 %   Eosinophils Relative 3.7 %   Basophils Relative 0.5 %  COMPLETE METABOLIC PANEL WITH GFR  Result Value Ref Range   Glucose, Bld 83 65 - 139 mg/dL   BUN 12 7 - 25 mg/dL   Creat 1.61 (H) 0.70 - 1.33 mg/dL   GFR, Est Non African American 46 (L) > OR = 60 mL/min/1.25m2   GFR, Est African American 53 (L) > OR = 60 mL/min/1.52m2   BUN/Creatinine Ratio 7 6 - 22 (calc)   Sodium 138 135 - 146 mmol/L    Potassium 4.3 3.5 - 5.3 mmol/L   Chloride 101 98 - 110 mmol/L   CO2 32 20 - 32 mmol/L   Calcium 9.2 8.6 - 10.3 mg/dL   Total Protein 6.8 6.1 - 8.1 g/dL   Albumin 4.0 3.6 - 5.1 g/dL   Globulin 2.8 1.9 - 3.7 g/dL (calc)   AG Ratio 1.4 1.0 - 2.5 (calc)   Total Bilirubin 0.3 0.2 - 1.2 mg/dL   Alkaline phosphatase (APISO) 73 40 - 115 U/L   AST 16 10 - 35 U/L   ALT 11 9 - 46 U/L  Lipid panel  Result Value Ref Range   Cholesterol 171 <200 mg/dL   HDL 47 >40 mg/dL   Triglycerides 132 <150 mg/dL   LDL Cholesterol (Calc) 101 (H) mg/dL (calc)   Total CHOL/HDL Ratio 3.6 <5.0 (calc)   Non-HDL Cholesterol (Calc) 124 <130  mg/dL (calc)  VITAMIN D 25 Hydroxy (Vit-D Deficiency, Fractures)  Result Value Ref Range   Vit D, 25-Hydroxy 26 (L) 30 - 100 ng/mL  B12  Result Value Ref Range   Vitamin B-12 276 200 - 1,100 pg/mL   I have reviewed the labs.  Assessment & Plan:    1.  Elevated PSA  - PSA is stable at 4.8  - RTC in 6 months for PSA and exam  2. History of gross hematuria  - UA is negative  - patient does not endorse gross hematuria  - RTC in 6 months for recheck on UA  3. Erectile dysfunction  - SHIM score is 18, it is stable  - RTC in 6 months for repeat SHIM score and exam    4. BPH with LUTS  - IPSS score is 5/1, it is improving  - Continue conservative management, avoiding bladder irritants and timed voiding's  - most bothersome symptoms is/are Incomplete emptying and frequency  - Continue tamsulosin 0.4 mg daily and finasteride 5 mg daily  - RTC in 6 months for IPSS, PSA and exam    Return in about 6 months (around 02/25/2018) for IPSS, SHIM, PSA, UA and exam.  These notes generated with voice recognition software. I apologize for typographical errors.  Zara Council, [facility] [address] [place], [place] [address] [phone]

## 2017-08-26 ENCOUNTER — Ambulatory Visit: Payer: Self-pay | Admitting: *Deleted

## 2017-08-26 NOTE — Telephone Encounter (Signed)
If the patient calls in for his lab results we will provide them however we are not making outbound calls to the patients.

## 2017-08-26 NOTE — Telephone Encounter (Signed)
Provider did try to call, if they call back please review the below info.

## 2017-08-27 ENCOUNTER — Encounter: Payer: Self-pay | Admitting: Urology

## 2017-08-27 ENCOUNTER — Ambulatory Visit (INDEPENDENT_AMBULATORY_CARE_PROVIDER_SITE_OTHER): Payer: Medicaid Other | Admitting: Urology

## 2017-08-27 VITALS — BP 122/76 | HR 80 | Ht 70.0 in | Wt 165.0 lb

## 2017-08-27 DIAGNOSIS — N529 Male erectile dysfunction, unspecified: Secondary | ICD-10-CM

## 2017-08-27 DIAGNOSIS — R972 Elevated prostate specific antigen [PSA]: Secondary | ICD-10-CM | POA: Diagnosis not present

## 2017-08-27 DIAGNOSIS — Z87448 Personal history of other diseases of urinary system: Secondary | ICD-10-CM | POA: Diagnosis not present

## 2017-08-27 DIAGNOSIS — N401 Enlarged prostate with lower urinary tract symptoms: Secondary | ICD-10-CM | POA: Diagnosis not present

## 2017-08-27 DIAGNOSIS — N138 Other obstructive and reflux uropathy: Secondary | ICD-10-CM | POA: Diagnosis not present

## 2017-08-27 MED ORDER — TAMSULOSIN HCL 0.4 MG PO CAPS: 0.4000 mg | ORAL_CAPSULE | Freq: Every day | ORAL | 3 refills | Status: DC

## 2017-08-27 MED ORDER — FINASTERIDE 5 MG PO TABS: 5.0000 mg | ORAL_TABLET | Freq: Every day | ORAL | 3 refills | Status: DC

## 2017-08-28 LAB — URINALYSIS, COMPLETE
Bilirubin, UA: NEGATIVE
Glucose, UA: NEGATIVE
Ketones, UA: NEGATIVE
LEUKOCYTES UA: NEGATIVE
Nitrite, UA: NEGATIVE
PH UA: 6 (ref 5.0–7.5)
PROTEIN UA: NEGATIVE
RBC UA: NEGATIVE
SPEC GRAV UA: 1.015 (ref 1.005–1.030)
Urobilinogen, Ur: 0.2 mg/dL (ref 0.2–1.0)

## 2017-12-04 ENCOUNTER — Ambulatory Visit: Payer: Medicaid Other | Admitting: Family Medicine

## 2017-12-10 ENCOUNTER — Ambulatory Visit: Payer: Medicaid Other | Admitting: Family Medicine

## 2017-12-10 ENCOUNTER — Encounter: Payer: Self-pay | Admitting: Family Medicine

## 2017-12-10 VITALS — BP 122/78 | HR 70 | Temp 97.5°F | Resp 16 | Wt 170.4 lb

## 2017-12-10 DIAGNOSIS — R2242 Localized swelling, mass and lump, left lower limb: Secondary | ICD-10-CM | POA: Diagnosis not present

## 2017-12-10 DIAGNOSIS — N182 Chronic kidney disease, stage 2 (mild): Secondary | ICD-10-CM

## 2017-12-10 DIAGNOSIS — E538 Deficiency of other specified B group vitamins: Secondary | ICD-10-CM

## 2017-12-10 DIAGNOSIS — M47812 Spondylosis without myelopathy or radiculopathy, cervical region: Secondary | ICD-10-CM

## 2017-12-10 DIAGNOSIS — M5441 Lumbago with sciatica, right side: Secondary | ICD-10-CM

## 2017-12-10 DIAGNOSIS — E782 Mixed hyperlipidemia: Secondary | ICD-10-CM | POA: Diagnosis not present

## 2017-12-10 DIAGNOSIS — M75122 Complete rotator cuff tear or rupture of left shoulder, not specified as traumatic: Secondary | ICD-10-CM | POA: Diagnosis not present

## 2017-12-10 DIAGNOSIS — G8929 Other chronic pain: Secondary | ICD-10-CM

## 2017-12-10 DIAGNOSIS — M25512 Pain in left shoulder: Secondary | ICD-10-CM | POA: Diagnosis not present

## 2017-12-10 DIAGNOSIS — M5442 Lumbago with sciatica, left side: Secondary | ICD-10-CM

## 2017-12-10 DIAGNOSIS — Z5181 Encounter for therapeutic drug level monitoring: Secondary | ICD-10-CM | POA: Diagnosis not present

## 2017-12-10 LAB — COMPLETE METABOLIC PANEL WITH GFR
AG Ratio: 1.3 (calc) (ref 1.0–2.5)
ALBUMIN MSPROF: 3.8 g/dL (ref 3.6–5.1)
ALT: 13 U/L (ref 9–46)
AST: 17 U/L (ref 10–35)
Alkaline phosphatase (APISO): 72 U/L (ref 40–115)
BILIRUBIN TOTAL: 0.3 mg/dL (ref 0.2–1.2)
BUN: 9 mg/dL (ref 7–25)
CHLORIDE: 104 mmol/L (ref 98–110)
CO2: 29 mmol/L (ref 20–32)
Calcium: 9.1 mg/dL (ref 8.6–10.3)
Creat: 1.33 mg/dL (ref 0.70–1.33)
GFR, Est African American: 67 mL/min/{1.73_m2} (ref >=60)
GFR, Est Non African American: 58 mL/min/{1.73_m2} — ABNORMAL LOW (ref >=60)
GLOBULIN: 3 g/dL (ref 1.9–3.7)
GLUCOSE: 80 mg/dL (ref 65–139)
Potassium: 3.8 mmol/L (ref 3.5–5.3)
SODIUM: 140 mmol/L (ref 135–146)
TOTAL PROTEIN: 6.8 g/dL (ref 6.1–8.1)

## 2017-12-10 LAB — LIPID PANEL
Cholesterol: 192 mg/dL (ref <200)
HDL: 43 mg/dL (ref >40)
LDL CHOLESTEROL (CALC): 127 mg/dL — AB
Non-HDL Cholesterol (Calc): 149 mg/dL (calc) — ABNORMAL HIGH (ref <130)
Total CHOL/HDL Ratio: 4.5 (calc) (ref <5.0)
Triglycerides: 113 mg/dL (ref <150)

## 2017-12-10 MED ORDER — VITAMIN B-12 500 MCG PO TABS: 500.0000 ug | ORAL_TABLET | Freq: Every day | ORAL | 11 refills | Status: DC

## 2017-12-10 NOTE — Patient Instructions (Addendum)
Try to limit saturated fats in your diet (bologna, hot dogs, barbeque, cheeseburgers, hamburgers, steak, bacon, sausage, cheese, etc.) and get more fresh fruits, vegetables, and whole grains Try to follow the DASH guidelines (DASH stands for Dietary Approaches to Stop Hypertension). Try to limit the sodium in your diet to no more than 1,500mg  of sodium per day. Certainly try to not exceed 2,000 mg per day at the very most. Do not add salt when cooking or at the table.  Check the sodium amount on labels when shopping, and choose items lower in sodium when given a choice. Avoid or limit foods that already contain a lot of sodium. Eat a diet rich in fruits and vegetables and whole grains, and try to lose weight if overweight or obese We'll get the MRI of your left leg We'll have you see the orthopaedist If you have not heard anything from my staff in a week about any orders/referrals/studies from today, please contact us here to follow-up [phone] I do encourage you to quit smoking Call [phone] to sign up for smoking cessation classes You can call 1-800-QUIT-NOW to talk with a smoking cessation coach

## 2017-12-10 NOTE — Assessment & Plan Note (Signed)
Refer to ortho.

## 2017-12-10 NOTE — Progress Notes (Signed)
BP 122/78   Pulse 70   Temp (!) 97.5 F (36.4 C) (Oral)   Resp 16   Wt 170 lb 6.4 oz (77.3 kg)   SpO2 97%   BMI 24.45 kg/m    Subjective:    Patient ID: Manuel [name], male    DOB: 08/14/1956, 62 y.o.   MRN: [id number]  HPI: Manuel [name] is a 62 y.o. male  Chief Complaint  Patient presents with  . Follow-up  . Neck Pain  . Back Pain    HPI He is having a lot of pain in his neck and back; he was seen by ortho in [place] and he has a lot of nerve damage Dr. Jimmye Norman checked his spine Old injuries to neck and back; he says they did a CT scan and MRI They gave him a shot to reduce pain from his rotator cuff He has ongoing shoulder pain and would like another referral; he has surgery in 2015 on the right rotator cuff; the left is torn Lower back pain with bilateral sciatica; real sharp pains like a needle in his back  Having problems with the left distal thigh; feels a knot in the distal thigh, under the skin; bothers him every now and then; stands a lot at work; getting bigger  High cholesterol Lab Results  Component Value Date   CHOL 192 12/10/2017   HDL 43 12/10/2017   LDLCALC 120 (H) 01/29/2017   TRIG 113 12/10/2017   CHOLHDL 4.5 12/10/2017  last LDL was actually 101; goal less than 70; taking medicine for chol; does not cook as much as he did; cooks Sunday dinners, works at [employer], some of it's fried; meatloaf and mac and cheese; not enough salads  Vitamin B12 low  Depression screen [facility] 2/9 12/10/2017 08/24/2017 08/18/2017 05/07/2017 01/29/2017  Decreased Interest 0 0 0 0 0  Down, Depressed, Hopeless 0 0 0 0 1  PHQ - 2 Score 0 0 0 0 1  Altered sleeping - - - - -  Tired, decreased energy - - - - -  Change in appetite - - - - -  Feeling bad or failure about yourself  - - - - -  Trouble concentrating - - - - -  Moving slowly or fidgety/restless - - - - -  Suicidal thoughts - - - - -  PHQ-9 Score - - - - -  Difficult doing work/chores - - - - -     Relevant past medical, surgical, family and social history reviewed Past Medical History:  Diagnosis Date  . Anemia   . Anxiety   . Asthma   . Bilateral carpal tunnel syndrome   . Bilateral hand numbness   . BPH without urinary obstruction   . Cervical spondylosis without myelopathy   . Complete tear of rotator cuff    bilateral  . COPD (chronic obstructive pulmonary disease) ([place])   . Depression   . History of stomach ulcers   . HLD (hyperlipidemia)   . LVH (left ventricular hypertrophy)   . Personal history of tobacco use, presenting hazards to health 12/21/2015  . Poor dentition   . Sickle cell trait ([place])   . Sleep apnea   . Smoker    Past Surgical History:  Procedure Laterality Date  . COLONOSCOPY WITH PROPOFOL N/A 12/25/2015   Procedure: COLONOSCOPY WITH PROPOFOL;  Surgeon: Lucilla Lame, MD;  Location: [facility] ENDOSCOPY;  Service: Endoscopy;  Laterality: N/A;  . CYSTOURETHROSCOPY  08/25/12   with  ureteral cathization w/wo retrograde pyelogram  . ROTATOR CUFF REPAIR Right 01/25/15  . SHOULDER ARTHROSCOPY WITH SUBACROMIAL DECOMPRESSION Right 032416  . TRANSURETHRAL RESECTION OF PROSTATE  08/25/12   Family History  Problem Relation Age of Onset  . Aneurysm Mother   . Hypertension Mother   . Diabetes Father   . Hypertension Father   . Prostate cancer Father   . Cancer Maternal Aunt   . Cancer Maternal Uncle   . Diabetes Paternal Uncle   . Heart disease Cousin   . Prostate cancer Paternal Uncle   . COPD Neg Hx   . Stroke Neg Hx   . Kidney disease Neg Hx    Social History   Tobacco Use  . Smoking status: Current Every Day Smoker    Packs/day: 0.50    Years: 35.00    Pack years: 17.50    Types: Cigarettes  . Smokeless tobacco: Never Used  Substance Use Topics  . Alcohol use: No  . 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 Allergies and medications reviewed  Review of Systems Per HPI unless  specifically indicated above     Objective:    BP 122/78   Pulse 70   Temp (!) 97.5 F (36.4 C) (Oral)   Resp 16   Wt 170 lb 6.4 oz (77.3 kg)   SpO2 97%   BMI 24.45 kg/m   Wt Readings from Last 3 Encounters:  12/10/17 170 lb 6.4 oz (77.3 kg)  08/27/17 165 lb (74.8 kg)  08/24/17 163 lb 8 oz (74.2 kg)    Physical Exam  Constitutional: He appears well-developed and well-nourished. No distress.  Eyes: No scleral icterus.  Cardiovascular: Normal rate and regular rhythm.  Pulmonary/Chest: Effort normal and breath sounds normal.  Abdominal: He exhibits no distension.  Musculoskeletal:       Right shoulder: He exhibits normal range of motion.       Left shoulder: He exhibits decreased range of motion.       Left upper leg: He exhibits swelling (hard swelling about the size of a grape distal anterolateral thigh).  Neurological: He is alert.  Skin: No pallor.  Psychiatric: He has a normal mood and affect.      Assessment & Plan:   Problem List Items Addressed This Visit      Musculoskeletal and Integument   Complete rotator cuff rupture of left shoulder    Refer to orthopaedist for this      Relevant Orders   Ambulatory referral to Orthopedic Surgery   Cervical spondylosis without myelopathy    Chronic neck pain, refer to orthopaedist for that      Relevant Orders   Ambulatory referral to Orthopedic Surgery     Genitourinary   Chronic kidney disease (CKD), stage II (mild)     Other   Medication monitoring encounter   Hyperlipidemia   Vitamin B12 deficiency    Start on replacement      Pain in shoulder    Refer to ortho      Relevant Orders   Ambulatory referral to Orthopedic Surgery   Chronic lower back pain    Refer to orthopaedist      Relevant Orders   Ambulatory referral to Orthopedic Surgery    Other Visit Diagnoses    Mass of leg, left    -  Primary   getting larger, firm component; will get MRI w/wo contrast  Relevant Orders   MR FEMUR LEFT  W WO CONTRAST       Follow up plan: Return in about 3 months (around 03/09/2018) for follow-up visit with Dr. Sanda Klein.  An after-visit summary was printed and given to the patient at [place].  Please see the patient instructions which may contain other information and recommendations beyond what is mentioned above in the assessment and plan.  Meds ordered this encounter  Medications  . vitamin B-12 (CYANOCOBALAMIN) 500 MCG tablet    Sig: Take 1 tablet (500 mcg total) by mouth daily.    Dispense:  30 tablet    Refill:  11    Orders Placed This Encounter  Procedures  . MR FEMUR LEFT W WO CONTRAST  . Ambulatory referral to Orthopedic Surgery

## 2017-12-10 NOTE — Assessment & Plan Note (Signed)
Refer to orthopaedist 

## 2017-12-10 NOTE — Assessment & Plan Note (Signed)
Refer to orthopaedist for this

## 2017-12-10 NOTE — Assessment & Plan Note (Signed)
Start on replacement

## 2017-12-10 NOTE — Assessment & Plan Note (Signed)
Chronic neck pain, refer to orthopaedist for that

## 2017-12-11 ENCOUNTER — Other Ambulatory Visit: Payer: Self-pay | Admitting: Family Medicine

## 2017-12-11 ENCOUNTER — Telehealth: Payer: Self-pay

## 2017-12-11 MED ORDER — ATORVASTATIN CALCIUM 20 MG PO TABS: 20.0000 mg | ORAL_TABLET | Freq: Every day | ORAL | 1 refills | Status: DC

## 2017-12-11 NOTE — Progress Notes (Signed)
Start back on statin 

## 2017-12-11 NOTE — Telephone Encounter (Signed)
Called pt no answer. LM for pt informing him of lab results. Advised pt to call back for questions or concerns. CRM created. Lab noted routed to [facility] [place].

## 2017-12-11 NOTE — Telephone Encounter (Signed)
-----   Message from Arnetha Courser, MD sent at 12/11/2017  9:36 AM EST ----- Please let pt know that his kidney function has improved; his glucose is normal; his liver enzymes are fine; his cholesterol is higher than ideal; I'd like to have him start back on the cholesterol medicine; it looks like he would have been out for a while or perhaps missing a lot of doses based on prescription history; start taking the atrovastatin every single night and that should help decrease his chance of a future heart attack; really avoid fatty meats, cheese, fried foods; thank you

## 2017-12-15 ENCOUNTER — Telehealth: Payer: Self-pay | Admitting: *Deleted

## 2017-12-15 DIAGNOSIS — Z122 Encounter for screening for malignant neoplasm of respiratory organs: Secondary | ICD-10-CM

## 2017-12-15 DIAGNOSIS — Z87891 Personal history of nicotine dependence: Secondary | ICD-10-CM

## 2017-12-15 NOTE — Telephone Encounter (Signed)
Notified patient that annual lung cancer screening low dose CT scan is due currently or will be in near future. Confirmed that patient is within the age range of 55-77, and asymptomatic, (no signs or symptoms of lung cancer). Patient denies illness that would prevent curative treatment for lung cancer if found. Verified smoking history, (current, 53.5 pack year). The shared decision making visit was done 12/21/15. Patient is agreeable for CT scan being scheduled.

## 2017-12-15 NOTE — Telephone Encounter (Signed)
Left message for patient to notify them that it is time to schedule annual low dose lung cancer screening CT scan. Instructed patient to call back to verify information prior to the scan being scheduled.  

## 2017-12-19 NOTE — Progress Notes (Signed)
Closing out lab/order note open since:  02/19/17

## 2017-12-22 ENCOUNTER — Ambulatory Visit
Admission: RE | Admit: 2017-12-22 | Discharge: 2017-12-22 | Disposition: A | Discharge status: Home / Self Care | Payer: Medicaid Other | Source: Ambulatory Visit | Attending: Oncology | Admitting: Oncology

## 2017-12-22 DIAGNOSIS — I2510 Atherosclerotic heart disease of native coronary artery without angina pectoris: Secondary | ICD-10-CM | POA: Diagnosis not present

## 2017-12-22 DIAGNOSIS — Z87891 Personal history of nicotine dependence: Secondary | ICD-10-CM | POA: Insufficient documentation

## 2017-12-22 DIAGNOSIS — I700 Atherosclerosis of aorta: Secondary | ICD-10-CM | POA: Diagnosis not present

## 2017-12-22 DIAGNOSIS — Z122 Encounter for screening for malignant neoplasm of respiratory organs: Secondary | ICD-10-CM | POA: Insufficient documentation

## 2017-12-22 DIAGNOSIS — J439 Emphysema, unspecified: Secondary | ICD-10-CM | POA: Diagnosis not present

## 2017-12-24 ENCOUNTER — Ambulatory Visit
Admission: RE | Admit: 2017-12-24 | Discharge: 2017-12-24 | Disposition: A | Discharge status: Home / Self Care | Payer: Medicaid Other | Source: Ambulatory Visit | Attending: Family Medicine | Admitting: Family Medicine

## 2017-12-24 ENCOUNTER — Other Ambulatory Visit: Payer: Self-pay

## 2017-12-24 DIAGNOSIS — R2242 Localized swelling, mass and lump, left lower limb: Secondary | ICD-10-CM | POA: Diagnosis not present

## 2017-12-24 MED ORDER — GADOBENATE DIMEGLUMINE 529 MG/ML IV SOLN
15.0000 mL | Freq: Once | INTRAVENOUS | Status: AC | PRN
  Administered 2017-12-24: 15 mL via INTRAVENOUS

## 2017-12-24 NOTE — Telephone Encounter (Signed)
Pt wants to stop smoking, can you prescribe nicotrol inhaler?

## 2017-12-28 ENCOUNTER — Encounter: Payer: Self-pay | Admitting: *Deleted

## 2017-12-29 NOTE — Telephone Encounter (Signed)
Okay but I need a pharmacy please Encouraged him to attend the smoking cessation classes at the hospital and/or call 1-800-QUIT-NOW too Thank you

## 2017-12-29 NOTE — Telephone Encounter (Signed)
Left voicemail as to which pharmacy

## 2017-12-30 ENCOUNTER — Telehealth: Payer: Self-pay | Admitting: Family Medicine

## 2017-12-30 DIAGNOSIS — R2242 Localized swelling, mass and lump, left lower limb: Secondary | ICD-10-CM

## 2017-12-30 MED ORDER — NICOTINE 10 MG IN INHA: 1.0000 | RESPIRATORY_TRACT | 0 refills | Status: DC | PRN

## 2017-12-30 NOTE — Telephone Encounter (Signed)
Pharmacy updated.

## 2017-12-30 NOTE — Telephone Encounter (Signed)
Pt. Given MRI results, verbalizes understanding. Requests a referral to a surgeon due to painful leg. Request prescriptions be sent to [facility].

## 2017-12-30 NOTE — Assessment & Plan Note (Signed)
Refer to surgeon. 

## 2017-12-30 NOTE — Telephone Encounter (Signed)
Referral made Will send Rx in the other note for that

## 2017-12-30 NOTE — Addendum Note (Signed)
Addended by: Darah Simkin, Satira Anis on: 12/30/2017 05:26 PM   Modules accepted: Orders

## 2018-01-05 ENCOUNTER — Other Ambulatory Visit: Payer: Self-pay | Admitting: Orthopedic Surgery

## 2018-01-05 DIAGNOSIS — M25512 Pain in left shoulder: Secondary | ICD-10-CM

## 2018-01-11 ENCOUNTER — Encounter: Payer: Self-pay | Admitting: Family Medicine

## 2018-01-11 ENCOUNTER — Telehealth: Payer: Self-pay | Admitting: Family Medicine

## 2018-01-11 DIAGNOSIS — J439 Emphysema, unspecified: Secondary | ICD-10-CM

## 2018-01-11 DIAGNOSIS — I2510 Atherosclerotic heart disease of native coronary artery without angina pectoris: Secondary | ICD-10-CM

## 2018-01-11 DIAGNOSIS — I700 Atherosclerosis of aorta: Secondary | ICD-10-CM

## 2018-01-11 HISTORY — DX: Emphysema, unspecified: J43.9

## 2018-01-11 HISTORY — DX: Atherosclerosis of aorta: I70.0

## 2018-01-11 NOTE — Assessment & Plan Note (Signed)
appt to discuss

## 2018-01-11 NOTE — Telephone Encounter (Signed)
Letter from: Manuel [name]  Comments: LDCT results  IMPRESSION:  1. Lung-RADS 1, negative. Continue annual screening with low-dose  chest CT without contrast in 12 months.  2. One vessel coronary atherosclerosis.    Aortic Atherosclerosis (ICD10-I70.0) and Emphysema (ICD10-J43.9).    [place] staff -- please ask patient to schedule an appointment to go over his CT scan results

## 2018-01-12 NOTE — Telephone Encounter (Signed)
Left detailed voicemial 

## 2018-01-14 ENCOUNTER — Ambulatory Visit (INDEPENDENT_AMBULATORY_CARE_PROVIDER_SITE_OTHER): Payer: Medicaid Other | Admitting: Surgery

## 2018-01-14 ENCOUNTER — Encounter: Payer: Self-pay | Admitting: Surgery

## 2018-01-14 VITALS — BP 134/76 | HR 86 | Temp 98.3°F | Ht 70.0 in | Wt 171.0 lb

## 2018-01-14 DIAGNOSIS — R2242 Localized swelling, mass and lump, left lower limb: Secondary | ICD-10-CM

## 2018-01-14 NOTE — Patient Instructions (Signed)
We will see you back in 2 weeks to remove your lipoma.  Lipoma Removal, Care After Refer to this sheet in the next few weeks. These instructions provide you with information about caring for yourself after your procedure. Your health care provider may also give you more specific instructions. Your treatment has been planned according to current medical practices, but problems sometimes occur. Call your health care provider if you have any problems or questions after your procedure. What can I expect after the procedure? After the procedure, it is common to have:  Mild pain.  Swelling.  Bruising.  Follow these instructions at home:  Bathing  Do not take baths, swim, or use a hot tub until your health care provider approves. Ask your health care provider if you can take showers. You may only be allowed to take sponge baths for bathing.  Keep your bandage (dressing) dry until your health care provider says it can be removed. Incision care   Follow instructions from your health care provider about how to take care of your incision. Make sure you: ? Wash your hands with soap and water before you change your bandage (dressing). If soap and water are not available, use hand sanitizer. ? Change your dressing as told by your health care provider. ? Leave stitches (sutures), skin glue, or adhesive strips in place. These skin closures may need to stay in place for 2 weeks or longer. If adhesive strip edges start to loosen and curl up, you may trim the loose edges. Do not remove adhesive strips completely unless your health care provider tells you to do that.  Check your incision area every day for signs of infection. Check for: ? More redness, swelling, or pain. ? Fluid or blood. ? Warmth. ? Pus or a bad smell. Driving  Do not drive or operate heavy machinery while taking prescription pain medicine.  Do not drive for 24 hours if you received a medicine to help you relax (sedative) during  your procedure.  Ask your health care provider when it is safe for you to drive. General instructions  Take over-the-counter and prescription medicines only as told by your health care provider.  Do not use any tobacco products, such as cigarettes, chewing tobacco, and e-cigarettes. These can delay healing. If you need help quitting, ask your health care provider.  Return to your normal activities as told by your health care provider. Ask your health care provider what activities are safe for you.  Keep all follow-up visits as told by your health care provider. This is important. Contact a health care provider if:  You have more redness, swelling, or pain around your incision.  You have fluid or blood coming from your incision.  Your incision feels warm to the touch.  You have pus or a bad smell coming from your incision.  You have pain that does not get better with medicine. Get help right away if:  You have chills or a fever.  You have severe pain. This information is not intended to replace advice given to you by your health care provider. Make sure you discuss any questions you have with your health care provider. Document Released: 01/03/2016 Document Revised: 04/01/2016 Document Reviewed: 01/03/2016 Elsevier Interactive Patient Education  2018 [employer].

## 2018-01-15 ENCOUNTER — Ambulatory Visit: Payer: Medicaid Other

## 2018-01-15 ENCOUNTER — Encounter: Payer: Self-pay | Admitting: Surgery

## 2018-01-15 ENCOUNTER — Ambulatory Visit
Admission: RE | Admit: 2018-01-15 | Discharge: 2018-01-15 | Disposition: A | Discharge status: Home / Self Care | Payer: Medicaid Other | Source: Ambulatory Visit | Attending: Orthopedic Surgery | Admitting: Orthopedic Surgery

## 2018-01-15 ENCOUNTER — Telehealth: Payer: Self-pay | Admitting: Family Medicine

## 2018-01-15 DIAGNOSIS — M62512 Muscle wasting and atrophy, not elsewhere classified, left shoulder: Secondary | ICD-10-CM | POA: Insufficient documentation

## 2018-01-15 DIAGNOSIS — M75122 Complete rotator cuff tear or rupture of left shoulder, not specified as traumatic: Secondary | ICD-10-CM | POA: Diagnosis not present

## 2018-01-15 DIAGNOSIS — M25512 Pain in left shoulder: Secondary | ICD-10-CM | POA: Insufficient documentation

## 2018-01-15 NOTE — Telephone Encounter (Signed)
Left voice mail

## 2018-01-15 NOTE — Telephone Encounter (Signed)
Pt stopped by and is requesting a return call pertaining to his CT Scan.  [phone]

## 2018-01-15 NOTE — Progress Notes (Signed)
Surgical Consultation  01/15/2018  Manuel [name] is an 62 y.o. male.   Chief Complaint  Patient presents with  . New Patient (Initial Visit)    Mass of left thigh     HPI: referred by Dr. Sanda Klein for left thigh mass.  Ports that he has had it for several months and now has become symptomatic.  He experiences intermittent pain and discomfort that is mild to moderate intensity and is sharp.  No specific alleviating or aggravating factors.  MRI personally reviewed showing a 2.8 cm subcutaneous lesion on his left thigh Smokes daily and has a history of some COPD. CMP nml except creat 1.3. CBC nml. No easy brusing.  Past Medical History:  Diagnosis Date  . Anemia   . Anxiety   . Aortic atherosclerosis ([place]) 01/11/2018   CT scan Feb 2019  . Asthma   . Bilateral carpal tunnel syndrome   . Bilateral hand numbness   . BPH without urinary obstruction   . Cervical spondylosis without myelopathy   . Complete tear of rotator cuff    bilateral  . COPD (chronic obstructive pulmonary disease) ([place])   . Depression   . Emphysema lung ([place]) 01/11/2018   Chest CT Feb 2019  . History of stomach ulcers   . HLD (hyperlipidemia)   . LVH (left ventricular hypertrophy)   . Personal history of tobacco use, presenting hazards to health 12/21/2015  . Poor dentition   . Sickle cell trait ([place])   . Sleep apnea   . Smoker     Past Surgical History:  Procedure Laterality Date  . COLONOSCOPY WITH PROPOFOL N/A 12/25/2015   Procedure: COLONOSCOPY WITH PROPOFOL;  Surgeon: Lucilla Lame, MD;  Location: [facility] ENDOSCOPY;  Service: Endoscopy;  Laterality: N/A;  . CYSTOURETHROSCOPY  08/25/12   with ureteral cathization w/wo retrograde pyelogram  . ROTATOR CUFF REPAIR Right 01/25/15  . SHOULDER ARTHROSCOPY WITH SUBACROMIAL DECOMPRESSION Right [id number]  . TRANSURETHRAL RESECTION OF PROSTATE  08/25/12    Family History  Problem Relation Age of Onset  . Aneurysm Mother   . Hypertension Mother   . Diabetes Father    . Hypertension Father   . Prostate cancer Father   . Cancer Maternal Aunt   . Cancer Maternal Uncle   . Diabetes Paternal Uncle   . Heart disease Cousin   . Prostate cancer Paternal Uncle   . COPD Neg Hx   . Stroke Neg Hx   . Kidney disease Neg Hx     Social History:  reports that he has been smoking cigarettes.  He has a 17.50 pack-year smoking history. he has never used smokeless tobacco. He reports that he does not drink alcohol or use drugs.  Allergies:  Allergies  Allergen Reactions  . Penicillin G Other (See Comments)    Medications reviewed.     ROS Full ROS performed and is otherwise negative other than what is stated in the HPI    BP 134/76   Pulse 86   Temp 98.3 F (36.8 C) (Oral)   Ht 5\' 10"  (1.778 m)   Wt 77.6 kg (171 lb)   BMI 24.54 kg/m   Physical Exam  Constitutional: He is oriented to person, place, and time and well-developed, well-nourished, and in no distress. No distress.  HENT:  Head: Normocephalic and atraumatic.  Eyes: Right eye exhibits no discharge. Left eye exhibits no discharge. No scleral icterus.  Neck: Normal range of motion. Neck supple. No JVD present. No tracheal deviation present.  No thyromegaly present.  Cardiovascular: Normal rate and regular rhythm.  Pulmonary/Chest: Effort normal and breath sounds normal. No respiratory distress.  Abdominal: Soft. Bowel sounds are normal. He exhibits no distension. There is no tenderness. There is no rebound and no guarding.  Musculoskeletal: Normal range of motion.  3.5 cm sub q mass mobile anterior distal thigh  Neurological: He is alert and oriented to person, place, and time. Gait normal. GCS score is 15.  Skin: Skin is warm. He is not diaphoretic.  Psychiatric: Mood, memory, affect and judgment normal.  Nursing note and vitals reviewed.     Assessment/Plan: Sympt left thigh mass.  This with the patient in detail about the nature of the disease.  She is likely a benign lesion.   However this is symptomatic.  The patient the rationale for excision and we can do it in the office under local anesthetic.   Marland Kitchen  Discussed with the patient detail.  Risk benefits and possible complications.  He is to stop aspirin 5 days before.  Caroleen Hamman, MD [facility] General Surgeon

## 2018-01-28 ENCOUNTER — Ambulatory Visit (INDEPENDENT_AMBULATORY_CARE_PROVIDER_SITE_OTHER): Payer: Medicaid Other | Admitting: Surgery

## 2018-01-28 ENCOUNTER — Other Ambulatory Visit: Payer: Self-pay | Admitting: Surgery

## 2018-01-28 ENCOUNTER — Encounter: Payer: Self-pay | Admitting: Surgery

## 2018-01-28 VITALS — BP 130/78 | HR 74 | Temp 97.9°F | Resp 14 | Ht 70.0 in | Wt 171.0 lb

## 2018-01-28 DIAGNOSIS — R2242 Localized swelling, mass and lump, left lower limb: Secondary | ICD-10-CM

## 2018-01-28 NOTE — Patient Instructions (Addendum)
Today we have removed a Lipoma in our office. Please see information below regarding this type of tumor.  You are free to shower in 48 hours. This will be on 01/30/18  You have glue on your skin and sutures under the skin. The glue will come off on it's own in 10-14 days. You may shower normally until this occurs but do not submerge.  Please use Tylenol or Ibuprofen for pain as needed.  We will see you back in 2 weeks to ensure that this has healed and to review the final pathology. Please see your appointment below. You may continue your regular activities right away but if you are having pain while doing something, stop what you are doing and try this activity once again in 3 days. Please call our office with any questions or concerns prior to your appointment.   Lipoma Removal Lipoma removal is a surgical procedure to remove a noncancerous (benign) tumor that is made up of fat cells (lipoma). Most lipomas are small and painless and do not require treatment. They can form in many areas of the body but are most common under the skin of the back, shoulders, arms, and thighs. You may need lipoma removal if you have a lipoma that is large, growing, or causing discomfort. Lipoma removal may also be done for cosmetic reasons. Tell a health care provider about:  Any allergies you have.  All medicines you are taking, including vitamins, herbs, eye drops, creams, and over-the-counter medicines.  Any problems you or family members have had with anesthetic medicines.  Any blood disorders you have.  Any surgeries you have had.  Any medical conditions you have.  Whether you are pregnant or may be pregnant. What are the risks? Generally, this is a safe procedure. However, problems may occur, including:  Infection.  Bleeding.  Allergic reactions to medicines.  Damage to nerves or blood vessels near the lipoma.  Scarring.  What happens before the procedure? Staying hydrated Follow  instructions from your health care provider about hydration, which may include:  Up to 2 hours before the procedure - you may continue to drink clear liquids, such as water, clear fruit juice, black coffee, and plain tea.  Eating and drinking restrictions Follow instructions from your health care provider about eating and drinking, which may include:  8 hours before the procedure - stop eating heavy meals or foods such as meat, fried foods, or fatty foods.  6 hours before the procedure - stop eating light meals or foods, such as toast or cereal.  6 hours before the procedure - stop drinking milk or drinks that contain milk.  2 hours before the procedure - stop drinking clear liquids.  Medicines  Ask your health care provider about: ? Changing or stopping your regular medicines. This is especially important if you are taking diabetes medicines or blood thinners. ? Taking medicines such as aspirin and ibuprofen. These medicines can thin your blood. Do not take these medicines before your procedure if your health care provider instructs you not to.  You may be given antibiotic medicine to help prevent infection. General instructions  Ask your health care provider how your surgical site will be marked or identified.  You will have a physical exam. Your health care provider will check the size of the lipoma and whether it can be moved easily.  You may have imaging tests, such as: ? X-rays. ? CT scan. ? MRI.  Plan to have someone take you home from  the hospital or clinic. What happens during the procedure?  To reduce your risk of infection: ? Your health care team will wash or sanitize their hands. ? Your skin will be washed with soap.  You will be given one or more of the following: ? A medicine to help you relax (sedative). ? A medicine to numb the area (local anesthetic). ? A medicine to make you fall asleep (general anesthetic). ? A medicine that is injected into an area of  your body to numb everything below the injection site (regional anesthetic).  An incision will be made over the lipoma or very near the lipoma. The incision may be made in a natural skin line or crease.  Tissues, nerves, and blood vessels near the lipoma will be moved out of the way.  The lipoma and the capsule that surrounds it will be separated from the surrounding tissues.  The lipoma will be removed.  The incision may be closed with stitches (sutures).  A bandage (dressing) will be placed over the incision. What happens after the procedure?  Do not drive for 24 hours if you received a sedative.  Your blood pressure, heart rate, breathing rate, and blood oxygen level will be monitored until the medicines you were given have worn off. This information is not intended to replace advice given to you by your health care provider. Make sure you discuss any questions you have with your health care provider. Document Released: 01/03/2016 Document Revised: 03/27/2016 Document Reviewed: 01/03/2016 Elsevier Interactive Patient Education  [employer].

## 2018-01-29 ENCOUNTER — Telehealth: Payer: Self-pay

## 2018-01-29 ENCOUNTER — Encounter: Payer: Self-pay | Admitting: Surgery

## 2018-01-29 NOTE — Progress Notes (Signed)
DX: left thigh mass  Procedure: Excision of 3.5 cm mass left thigh, Deep ( below fascia)   Anesthesia: lidocaine 1 % w epi  Complications: none  EBL: minimal  Patient was splinted with a wrist and benefit of procedure and a consent was obtained.  He was prepped and draped in the usual sterile fashion and the mass was identified on the left thigh.  Using local anesthetic we infiltrated the skin subtenons tissue and the fascia.  Incision was created with a 15 blade knife and Metzenbaum were used to dissect through subtenons tissue.  The fascia of the quadriceps muscle was incised with Metzenbaum scissors and the soft tissue mass was found deep and below the fascial level.  Using Metzenbaums and bowel were able to dissect the mass from adjacent structures.  The mass was sent for pathology.  Fascia was closed with interrupted 2-0 Vicryl sutures, dermis closed with 3-0 Vicryl and the skin was closed with a 4-0 Monocryl. Dermabond was used to coat the skin

## 2018-01-29 NOTE — Telephone Encounter (Signed)
Copied from [place]. Topic: General - Other >> Jan 28, 2018 12:34 PM Manuel [name] wrote: Reason for CRM: pt calling to let Dr Sanda Klein know that he will be having his surgery today on his lt leg

## 2018-01-29 NOTE — Telephone Encounter (Signed)
Thank you :)

## 2018-02-02 ENCOUNTER — Ambulatory Visit: Payer: Medicaid Other | Admitting: Family Medicine

## 2018-02-04 ENCOUNTER — Encounter: Payer: Self-pay | Admitting: Surgery

## 2018-02-04 LAB — PATHOLOGY

## 2018-02-10 ENCOUNTER — Encounter: Payer: Self-pay | Admitting: Surgery

## 2018-02-10 ENCOUNTER — Ambulatory Visit (INDEPENDENT_AMBULATORY_CARE_PROVIDER_SITE_OTHER): Payer: Self-pay | Admitting: Surgery

## 2018-02-10 VITALS — BP 143/87 | HR 73 | Temp 98.0°F | Ht 70.0 in | Wt 171.0 lb

## 2018-02-10 DIAGNOSIS — Z09 Encounter for follow-up examination after completed treatment for conditions other than malignant neoplasm: Secondary | ICD-10-CM

## 2018-02-10 NOTE — Patient Instructions (Signed)
Please give us a call in case you have any questions or concerns.  

## 2018-02-10 NOTE — Progress Notes (Signed)
02/10/2018  HPI: Patient is status post left thigh lipoma excision with Dr. Adora Fridge on 3/28.  She presents today for follow-up.  He denies any worsening pain, or issues with the incision itself.  He does report that during the daytime after a long day at work he notices that his left thigh swells up a little bit around the incision and then flattens out again at night after sleeping.  Denies any drainage, ecchymosis, or worsening pain.  Vital signs: BP (!) 143/87   Pulse 73   Temp 98 F (36.7 C) (Oral)   Ht 5\' 10"  (1.778 m)   Wt 171 lb (77.6 kg)   BMI 24.54 kg/m    Physical Exam: Constitutional: No acute distress Skin: Left thigh incision site is healing well and is clean dry and intact with no evidence of infection.  There is some firmness particularly at the incision itself but no evidence of infection.  No noticeable fluctuance or seroma.  Assessment/Plan: 62 year old male status post left thigh lipoma excision.  -Reviewed pathology report with the patient.  Lipoma. - Patient's wound is healing well.  Recommended to the patient that he may apply an Ace wrap at the thigh level in order to decrease some of the swelling he may also apply ice packs as needed. -He may return to [place] on an as-needed basis.   Melvyn Neth, [place]

## 2018-02-16 ENCOUNTER — Encounter: Payer: Self-pay | Admitting: Family Medicine

## 2018-02-22 ENCOUNTER — Encounter: Payer: Self-pay | Admitting: Urology

## 2018-02-22 ENCOUNTER — Other Ambulatory Visit: Payer: Medicaid Other

## 2018-02-25 ENCOUNTER — Ambulatory Visit: Payer: Medicaid Other | Admitting: Urology

## 2018-03-11 ENCOUNTER — Ambulatory Visit: Payer: Medicaid Other | Admitting: Family Medicine

## 2018-03-22 ENCOUNTER — Other Ambulatory Visit
Admission: RE | Admit: 2018-03-22 | Discharge: 2018-03-22 | Disposition: A | Discharge status: Home / Self Care | Payer: Medicaid Other | Source: Ambulatory Visit | Attending: Surgery | Admitting: Surgery

## 2018-03-22 ENCOUNTER — Other Ambulatory Visit: Payer: Medicaid Other

## 2018-03-22 DIAGNOSIS — R972 Elevated prostate specific antigen [PSA]: Secondary | ICD-10-CM

## 2018-03-23 LAB — PSA: Prostate Specific Ag, Serum: 5.1 ng/mL — ABNORMAL HIGH (ref 0.0–4.0)

## 2018-03-25 ENCOUNTER — Other Ambulatory Visit: Payer: Medicaid Other

## 2018-03-30 NOTE — Progress Notes (Deleted)
6:54 PM   Manuel [name] [birth year] [id number]  Referring provider: Arnetha Courser, [address] [place], [place] [address]  No chief complaint on file.   HPI: Patient is a 62 year old African-American male with presents with an elevated PSA, a history of gross hematuria, erectile dysfunction and BPH with LUTS who presents today for a 6 months follow up.    Elevated PSA Patient's PSA has been variable over the last two years ranging from 4 to 6.  His free and total PSA noted a 24% probability of having prostate cancer.  PCPT risk notes a 25% chance of a high grade cancer, 32% low grade cancer and a 43% chance of no cancer found on biopsy.  His father and paternal grandfather had been diagnosed with prostate cancer.  He underwent a TRUSPBx of prostate on 10/07/2016 with Dr. Noah Delaine.  Findings were 51.7 gram prostate and pathology was negative for malignancy.  PSA was 5.1 ng/mL in 03/2018.        History of gross hematuria Patient had an episode of clot retention in 2013.  He underwent a CT Urogram, cystoscopy with bilateral retrogrades and ultimately a TURP with Dr. Darcus Austin at [facility].  No malignancies were discovered.  Hematuria was from his enlarged, friable prostate.  He has not had any gross hematuria since that time.  A too small to characterize lesion was seen in the left midpole of the kidney on the CT Urogram in 2013.  Renal ultrasound completed on 02/14/2016 noted no renal masses, no hydronephrosis and an enlarged prostate.  He denies any gross hematuria.  His UA today is negative.  ***  Erectile dysfunction His SHIM score is ***, which is *** erectile dysfunction.   His previous SHIM score was 18.  He has been having difficulty with erections since starting the finasteride.   His major complaint is now having the confidence to achieve an erections.  His libido is preserved.   His risk factors for ED are hyperlipidemia, depression, age, BPH and smoking.  He  denies any painful erections or curvatures with his erections.   He states that his wife has bi-polar disease and has left him at this time.     Score: 1-7 Severe ED 8-11 Moderate ED 12-16 Mild-Moderate ED 17-21 Mild ED 22-25 No ED   BPH WITH LUTS His IPSS score today is ***, which is *** lower urinary tract symptomatology. He is *** with his quality life due to his urinary symptoms.  His previous PVR was *** mL. His previous I PSS score was 5/1.  His previous PVR was 22 mL.  His major complaint today a weak urinary stream.  He has had these symptoms for over three years.  *** He doesn't do that he does not drink a lot of fluids during the day.   He has been taking his tamsulosin and finasteride daily.   He denies any dysuria, hematuria or suprapubic pain.  His has had a TURP.  He also denies any recent fevers, chills, nausea or vomiting.   He has a family history of PCa, with father and paternal grand father with prostate cancer.       Score:  1-7 Mild 8-19 Moderate 20-35 Severe    PMH: Past Medical History:  Diagnosis Date  . Anemia   . Anxiety   . Aortic atherosclerosis ([place]) 01/11/2018   CT scan Feb 2019  . Asthma   . Bilateral carpal tunnel syndrome   .  Bilateral hand numbness   . BPH without urinary obstruction   . Cervical spondylosis without myelopathy   . Complete tear of rotator cuff    bilateral  . COPD (chronic obstructive pulmonary disease) ([place])   . Depression   . Emphysema lung ([place]) 01/11/2018   Chest CT Feb 2019  . History of stomach ulcers   . HLD (hyperlipidemia)   . LVH (left ventricular hypertrophy)   . Personal history of tobacco use, presenting hazards to health 12/21/2015  . Poor dentition   . Sickle cell trait ([place])   . Sleep apnea   . Smoker     Surgical History: Past Surgical History:  Procedure Laterality Date  . COLONOSCOPY WITH PROPOFOL N/A 12/25/2015   Procedure: COLONOSCOPY WITH PROPOFOL;  Surgeon: Lucilla Lame, MD;  Location: [facility]  ENDOSCOPY;  Service: Endoscopy;  Laterality: N/A;  . CYSTOURETHROSCOPY  08/25/12   with ureteral cathization w/wo retrograde pyelogram  . ROTATOR CUFF REPAIR Right 01/25/15  . SHOULDER ARTHROSCOPY WITH SUBACROMIAL DECOMPRESSION Right [id number]  . TRANSURETHRAL RESECTION OF PROSTATE  08/25/12    Home Medications:  Allergies as of 03/31/2018      Reactions   Penicillin G Other (See Comments)      Medication List        Accurate as of 03/30/18  6:54 PM. Always use your most recent med list.          aspirin EC 81 MG tablet Take 1 tablet (81 mg total) by mouth daily.   atorvastatin 20 MG tablet Commonly known as:  LIPITOR Take 1 tablet (20 mg total) by mouth at bedtime.   buPROPion 150 MG 24 hr tablet Commonly known as:  WELLBUTRIN XL Take 1 tablet (150 mg total) by mouth daily.   finasteride 5 MG tablet Commonly known as:  PROSCAR Take 1 tablet (5 mg total) by mouth daily.   nicotine 10 MG inhaler Commonly known as:  NICOTROL Inhale 1 Cartridge (1 continuous puffing total) into the lungs as needed for smoking cessation.   sertraline 50 MG tablet Commonly known as:  ZOLOFT Take 1 tablet (50 mg total) by mouth daily.   tamsulosin 0.4 MG Caps capsule Commonly known as:  FLOMAX Take 1 capsule (0.4 mg total) by mouth daily.   vitamin B-12 500 MCG tablet Commonly known as:  CYANOCOBALAMIN Take 1 tablet (500 mcg total) by mouth daily.       Allergies:  Allergies  Allergen Reactions  . Penicillin G Other (See Comments)    Family History: Family History  Problem Relation Age of Onset  . Aneurysm Mother   . Hypertension Mother   . Diabetes Father   . Hypertension Father   . Prostate cancer Father   . Cancer Maternal Aunt   . Cancer Maternal Uncle   . Diabetes Paternal Uncle   . Heart disease Cousin   . Prostate cancer Paternal Uncle   . COPD Neg Hx   . Stroke Neg Hx   . Kidney disease Neg Hx     Social History:  reports that he has been smoking cigarettes.   He has a 17.50 pack-year smoking history. He has never used smokeless tobacco. He reports that he does not drink alcohol or use drugs.  ROS:                                        Physical Exam:  There were no vitals taken for this visit.  Constitutional: Well nourished. Alert and oriented, No acute distress. HEENT: [place] AT, moist mucus membranes. Trachea midline, no masses. Cardiovascular: No clubbing, cyanosis, or edema. Respiratory: Normal respiratory effort, no increased work of breathing. GI: Abdomen is soft, non tender, non distended, no abdominal masses. Liver and spleen not palpable.  No hernias appreciated.  Stool sample for occult testing is not indicated.   GU: No CVA tenderness.  No bladder fullness or masses.  Patient with circumcised phallus.  Urethral meatus is patent.  No penile discharge. No penile lesions or rashes. Scrotum without lesions, cysts, rashes and/or edema.  Testicles are located scrotally bilaterally. No masses are appreciated in the testicles. Left and right epididymis are normal. Rectal: Patient with  normal sphincter tone. Anus and perineum without scarring or rashes. No rectal masses are appreciated. Prostate is approximately 55 grams, no nodules are appreciated. Seminal vesicles are normal. Skin: No rashes, bruises or suspicious lesions. Lymph: No cervical or inguinal adenopathy. Neurologic: Grossly intact, no focal deficits, moving all 4 extremities. Psychiatric: Normal mood and affect. ***  Constitutional: Well nourished. Alert and oriented, No acute distress. HEENT: [place] AT, moist mucus membranes. Trachea midline, no masses. Cardiovascular: No clubbing, cyanosis, or edema. Respiratory: Normal respiratory effort, no increased work of breathing. GI: Abdomen is soft, non tender, non distended, no abdominal masses. Liver and spleen not palpable.  No hernias appreciated.  Stool sample for occult testing is not indicated.   GU: No CVA  tenderness.  No bladder fullness or masses.  Patient with circumcised/uncircumcised phallus. ***Foreskin easily retracted***  Urethral meatus is patent.  No penile discharge. No penile lesions or rashes. Scrotum without lesions, cysts, rashes and/or edema.  Testicles are located scrotally bilaterally. No masses are appreciated in the testicles. Left and right epididymis are normal. Rectal: Patient with  normal sphincter tone. Anus and perineum without scarring or rashes. No rectal masses are appreciated. Prostate is approximately *** grams, *** nodules are appreciated. Seminal vesicles are normal. Skin: No rashes, bruises or suspicious lesions. Lymph: No cervical or inguinal adenopathy. Neurologic: Grossly intact, no focal deficits, moving all 4 extremities. Psychiatric: Normal mood and affect.   Laboratory Data: Lab Results  Component Value Date   WBC 5.7 08/24/2017   HGB 13.4 08/24/2017   HCT 40.4 08/24/2017   MCV 75.7 (L) 08/24/2017   PLT 259 08/24/2017    Lab Results  Component Value Date   CREATININE 1.33 12/10/2017  PSA History  4.43 ng/mL on 12/01/2013  4.67 ng/mL on 08/28/2014  4.80 ng/mL on 11/02/2015  6.10 ng/mL on 02/04/2016  5.10 ng/mL on 02/19/2016  4.70 ng/mL on 05/08/2016  4.10 ng/mL on 01/05/2017  4.8 ng/mL in 08/2017   Results for orders placed or performed in visit on 03/22/18  PSA  Result Value Ref Range   Prostate Specific Ag, Serum 5.1 (H) 0.0 - 4.0 ng/mL   I have reviewed the labs.  Assessment & Plan:    1.  Elevated PSA  - PSA is stable at 5.1  - RTC in 6 months for PSA and exam  2. History of gross hematuria  - UA is negative  - patient does not endorse gross hematuria  - RTC in 6 months for recheck on UA  3. Erectile dysfunction  - SHIM score is 18, it is stable  - RTC in 6 months for repeat SHIM score and exam    4. BPH with LUTS  - IPSS score is  5/1, it is improving  - Continue conservative management, avoiding bladder irritants and  timed voiding's  - most bothersome symptoms is/are Incomplete emptying and frequency  - Continue tamsulosin 0.4 mg daily and finasteride 5 mg daily  - RTC in 6 months for IPSS, PSA and exam    No follow-ups on file.  These notes generated with voice recognition software. I apologize for typographical errors.  Zara Council, PA-C  [facility] [address] [place], [place] [address] [phone]

## 2018-03-31 ENCOUNTER — Ambulatory Visit: Payer: Medicaid Other | Admitting: Urology

## 2018-03-31 ENCOUNTER — Encounter: Payer: Self-pay | Admitting: Urology

## 2018-04-12 ENCOUNTER — Ambulatory Visit: Payer: Medicaid Other | Admitting: Family Medicine

## 2018-04-12 ENCOUNTER — Encounter: Payer: Self-pay | Admitting: Family Medicine

## 2018-04-12 VITALS — BP 108/78 | HR 80 | Temp 98.1°F | Resp 16 | Ht 70.0 in | Wt 164.0 lb

## 2018-04-12 DIAGNOSIS — R51 Headache: Secondary | ICD-10-CM | POA: Diagnosis not present

## 2018-04-12 DIAGNOSIS — I2510 Atherosclerotic heart disease of native coronary artery without angina pectoris: Secondary | ICD-10-CM | POA: Diagnosis not present

## 2018-04-12 DIAGNOSIS — D573 Sickle-cell trait: Secondary | ICD-10-CM | POA: Diagnosis not present

## 2018-04-12 DIAGNOSIS — Z5181 Encounter for therapeutic drug level monitoring: Secondary | ICD-10-CM

## 2018-04-12 DIAGNOSIS — E538 Deficiency of other specified B group vitamins: Secondary | ICD-10-CM

## 2018-04-12 DIAGNOSIS — E782 Mixed hyperlipidemia: Secondary | ICD-10-CM | POA: Diagnosis not present

## 2018-04-12 DIAGNOSIS — M47812 Spondylosis without myelopathy or radiculopathy, cervical region: Secondary | ICD-10-CM

## 2018-04-12 DIAGNOSIS — N182 Chronic kidney disease, stage 2 (mild): Secondary | ICD-10-CM

## 2018-04-12 DIAGNOSIS — Z9189 Other specified personal risk factors, not elsewhere classified: Secondary | ICD-10-CM | POA: Diagnosis not present

## 2018-04-12 DIAGNOSIS — F17200 Nicotine dependence, unspecified, uncomplicated: Secondary | ICD-10-CM | POA: Diagnosis not present

## 2018-04-12 DIAGNOSIS — J439 Emphysema, unspecified: Secondary | ICD-10-CM

## 2018-04-12 DIAGNOSIS — I700 Atherosclerosis of aorta: Secondary | ICD-10-CM | POA: Diagnosis not present

## 2018-04-12 DIAGNOSIS — E559 Vitamin D deficiency, unspecified: Secondary | ICD-10-CM

## 2018-04-12 DIAGNOSIS — Z23 Encounter for immunization: Secondary | ICD-10-CM

## 2018-04-12 DIAGNOSIS — R519 Headache, unspecified: Secondary | ICD-10-CM

## 2018-04-12 NOTE — Assessment & Plan Note (Addendum)
Refer to spine specialist; may be contributing to headaches; reviewed the MRI from [place]; see copy in [place]

## 2018-04-12 NOTE — Assessment & Plan Note (Signed)
Try to limit or avoid foods that come from cows and pigs; check lipids; goal LDL less than 70

## 2018-04-12 NOTE — Assessment & Plan Note (Signed)
Check and replace

## 2018-04-12 NOTE — Assessment & Plan Note (Signed)
Urged smoking cessation 

## 2018-04-12 NOTE — Assessment & Plan Note (Signed)
Discussed risk of heart attack with smoking and high lipids

## 2018-04-12 NOTE — Assessment & Plan Note (Signed)
Refer to neurologist; consider analgesic rebound; dehydration also a possibility

## 2018-04-12 NOTE — Assessment & Plan Note (Signed)
Reviewed last CBC

## 2018-04-12 NOTE — Patient Instructions (Addendum)
Try to get 64 ounces of water a day Let's have you see the neurologist If you have not heard anything from my staff in a week about any orders/referrals/studies from today, please contact us here to follow-up [phone] Try to limit saturated fats in your diet (bologna, hot dogs, barbeque, cheeseburgers, hamburgers, steak, bacon, sausage, cheese, etc.) and get more fresh fruits, vegetables, and whole grains   Health Risks of Smoking Smoking cigarettes is very bad for your health. Tobacco smoke has over 200 known poisons in it. It contains the poisonous gases nitrogen oxide and carbon monoxide. There are over 60 chemicals in tobacco smoke that cause cancer. Smoking is difficult to quit because a chemical in tobacco, called nicotine, causes addiction or dependence. When you smoke and inhale, nicotine is absorbed rapidly into the bloodstream through your lungs. Both inhaled and non-inhaled nicotine may be addictive. What are the risks of cigarette smoke? Cigarette smokers have an increased risk of many serious medical problems, including:  Lung cancer.  Lung disease, such as pneumonia, bronchitis, and emphysema.  Chest pain (angina) and heart attack because the heart is not getting enough oxygen.  Heart disease and peripheral blood vessel disease.  High blood pressure (hypertension).  Stroke.  Oral cancer, including cancer of the lip, mouth, or voice box.  Bladder cancer.  Pancreatic cancer.  Cervical cancer.  Pregnancy complications, including premature birth.  Stillbirths and smaller newborn babies, birth defects, and genetic damage to sperm.  Early menopause.  Lower estrogen level for women.  Infertility.  Facial wrinkles.  Blindness.  Increased risk of broken bones (fractures).  Senile dementia.  Stomach ulcers and internal bleeding.  Delayed wound healing and increased risk of complications during surgery.  Even smoking lightly shortens your life expectancy  by several years.  Because of secondhand smoke exposure, children of smokers have an increased risk of the following:  Sudden infant death syndrome (SIDS).  Respiratory infections.  Lung cancer.  Heart disease.  Ear infections.  What are the benefits of quitting? There are many health benefits of quitting smoking. Here are some of them:  Within days of quitting smoking, your risk of having a heart attack decreases, your blood flow improves, and your lung capacity improves. Blood pressure, pulse rate, and breathing patterns start returning to normal soon after quitting.  Within months, your lungs may clear up completely.  Quitting for 10 years reduces your risk of developing lung cancer and heart disease to almost that of a nonsmoker.  People who quit may see an improvement in their overall quality of life.  How do I quit smoking? Smoking is an addiction with both physical and psychological effects, and longtime habits can be hard to change. Your health care provider can recommend:  Programs and community resources, which may include group support, education, or talk therapy.  Prescription medicines to help reduce cravings.  Nicotine replacement products, such as patches, gum, and nasal sprays. Use these products only as directed. Do not replace cigarette smoking with electronic cigarettes, which are commonly called e-cigarettes. The safety of e-cigarettes is not known, and some may contain harmful chemicals.  A combination of two or more of these methods.  Where to find more information:  American Lung Association: www.lung.org  American Cancer Society: www.cancer.org Summary  Smoking cigarettes is very bad for your health. Cigarette smokers have an increased risk of many serious medical problems, including several cancers, heart disease, and stroke.  Smoking is an addiction with both physical and psychological  effects, and longtime habits can be hard to change.  By  stopping right away, you can greatly reduce the risk of medical problems for you and your family.  To help you quit smoking, your health care provider can recommend programs, community resources, prescription medicines, and nicotine replacement products such as patches, gum, and nasal sprays. This information is not intended to replace advice given to you by your health care provider. Make sure you discuss any questions you have with your health care provider. Document Released: 11/27/2004 Document Revised: 10/24/2016 Document Reviewed: 10/24/2016 Elsevier Interactive Patient Education  2017 [employer].  Steps to Quit Smoking Smoking tobacco can be bad for your health. It can also affect almost every organ in your body. Smoking puts you and people around you at risk for many serious long-lasting (chronic) diseases. Quitting smoking is hard, but it is one of the best things that you can do for your health. It is never too late to quit. What are the benefits of quitting smoking? When you quit smoking, you lower your risk for getting serious diseases and conditions. They can include:  Lung cancer or lung disease.  Heart disease.  Stroke.  Heart attack.  Not being able to have children (infertility).  Weak bones (osteoporosis) and broken bones (fractures).  If you have coughing, wheezing, and shortness of breath, those symptoms may get better when you quit. You may also get sick less often. If you are pregnant, quitting smoking can help to lower your chances of having a baby of low birth weight. What can I do to help me quit smoking? Talk with your doctor about what can help you quit smoking. Some things you can do (strategies) include:  Quitting smoking totally, instead of slowly cutting back how much you smoke over a period of time.  Going to in-person counseling. You are more likely to quit if you go to many counseling sessions.  Using resources and support systems, such  as: ? Database administrator with a Social worker. ? Phone quitlines. ? Careers information officer. ? Support groups or group counseling. ? Text messaging programs. ? Mobile phone apps or applications.  Taking medicines. Some of these medicines may have nicotine in them. If you are pregnant or breastfeeding, do not take any medicines to quit smoking unless your doctor says it is okay. Talk with your doctor about counseling or other things that can help you.  Talk with your doctor about using more than one strategy at the same time, such as taking medicines while you are also going to in-person counseling. This can help make quitting easier. What things can I do to make it easier to quit? Quitting smoking might feel very hard at first, but there is a lot that you can do to make it easier. Take these steps:  Talk to your family and friends. Ask them to support and encourage you.  Call phone quitlines, reach out to support groups, or work with a Social worker.  Ask people who smoke to not smoke around you.  Avoid places that make you want (trigger) to smoke, such as: ? Bars. ? Parties. ? Smoke-break areas at work.  Spend time with people who do not smoke.  Lower the stress in your life. Stress can make you want to smoke. Try these things to help your stress: ? Getting regular exercise. ? Deep-breathing exercises. ? Yoga. ? Meditating. ? Doing a body scan. To do this, close your eyes, focus on one area of your body at  a time from head to toe, and notice which parts of your body are tense. Try to relax the muscles in those areas.  Download or buy apps on your mobile phone or tablet that can help you stick to your quit plan. There are many free apps, such as QuitGuide from the [employer] Office manager for Disease Control and Prevention). You can find more support from smokefree.gov and other websites.  This information is not intended to replace advice given to you by your health care provider. Make sure you  discuss any questions you have with your health care provider. Document Released: 08/16/2009 Document Revised: 06/17/2016 Document Reviewed: 03/06/2015 Elsevier Interactive Patient Education  2018 [employer].  Fat and Cholesterol Restricted Diet Getting too much fat and cholesterol in your diet may cause health problems. Following this diet helps keep your fat and cholesterol at normal levels. This can keep you from getting sick. What types of fat should I choose?  Choose monosaturated and polyunsaturated fats. These are found in foods such as olive oil, canola oil, flaxseeds, walnuts, almonds, and seeds.  Eat more omega-3 fats. Good choices include salmon, mackerel, sardines, tuna, flaxseed oil, and ground flaxseeds.  Limit saturated fats. These are in animal products such as meats, butter, and cream. They can also be in plant products such as palm oil, palm kernel oil, and coconut oil.  Avoid foods with partially hydrogenated oils in them. These contain trans fats. Examples of foods that have trans fats are stick margarine, some tub margarines, cookies, crackers, and other baked goods. What general guidelines do I need to follow?  Check food labels. Look for the words "trans fat" and "saturated fat."  When preparing a meal: ? Fill half of your plate with vegetables and green salads. ? Fill one fourth of your plate with whole grains. Look for the word "whole" as the first word in the ingredient list. ? Fill one fourth of your plate with lean protein foods.  Eat more foods that have fiber, like apples, carrots, beans, peas, and barley.  Eat more home-cooked foods. Eat less at restaurants and buffets.  Limit or avoid alcohol.  Limit foods high in starch and sugar.  Limit fried foods.  Cook foods without frying them. Baking, boiling, grilling, and broiling are all great options.  Lose weight if you are overweight. Losing even a small amount of weight can help your overall health.  It can also help prevent diseases such as diabetes and heart disease. What foods can I eat? Grains Whole grains, such as whole wheat or whole grain breads, crackers, cereals, and pasta. Unsweetened oatmeal, bulgur, barley, quinoa, or brown rice. Corn or whole wheat flour tortillas. Vegetables Fresh or frozen vegetables (raw, steamed, roasted, or grilled). Green salads. Fruits All fresh, canned (in natural juice), or frozen fruits. Meat and Other Protein Products Ground beef (85% or leaner), grass-fed beef, or beef trimmed of fat. Skinless chicken or [place]. Ground chicken or [place]. Pork trimmed of fat. All fish and seafood. Eggs. Dried beans, peas, or lentils. Unsalted nuts or seeds. Unsalted canned or dry beans. Dairy Low-fat dairy products, such as skim or 1% milk, 2% or reduced-fat cheeses, low-fat ricotta or cottage cheese, or plain low-fat yogurt. Fats and Oils Tub margarines without trans fats. Light or reduced-fat mayonnaise and salad dressings. Avocado. Olive, canola, sesame, or safflower oils. Natural peanut or almond butter (choose ones without added sugar and oil). The items listed above may not be a complete list of recommended foods  or beverages. Contact your dietitian for more options. What foods are not recommended? Grains White bread. White pasta. White rice. Cornbread. Bagels, pastries, and croissants. Crackers that contain trans fat. Vegetables White potatoes. Corn. Creamed or fried vegetables. Vegetables in a cheese sauce. Fruits Dried fruits. Canned fruit in light or heavy syrup. Fruit juice. Meat and Other Protein Products Fatty cuts of meat. Ribs, chicken wings, bacon, sausage, bologna, salami, chitterlings, fatback, hot dogs, bratwurst, and packaged luncheon meats. Liver and organ meats. Dairy Whole or 2% milk, cream, half-and-half, and cream cheese. Whole milk cheeses. Whole-fat or sweetened yogurt. Full-fat cheeses. Nondairy creamers and whipped toppings. Processed  cheese, cheese spreads, or cheese curds. Sweets and Desserts Corn syrup, sugars, honey, and molasses. Candy. Jam and jelly. Syrup. Sweetened cereals. Cookies, pies, cakes, donuts, muffins, and ice cream. Fats and Oils Butter, stick margarine, lard, shortening, ghee, or bacon fat. Coconut, palm kernel, or palm oils. Beverages Alcohol. Sweetened drinks (such as sodas, lemonade, and fruit drinks or punches). The items listed above may not be a complete list of foods and beverages to avoid. Contact your dietitian for more information. This information is not intended to replace advice given to you by your health care provider. Make sure you discuss any questions you have with your health care provider. Document Released: 04/20/2012 Document Revised: 06/26/2016 Document Reviewed: 01/19/2014 Elsevier Interactive Patient Education  [employer].

## 2018-04-12 NOTE — Assessment & Plan Note (Signed)
Goal LDL less than 70; discussed dietary changes; explained high lipids and poor diet could shorten his life

## 2018-04-12 NOTE — Assessment & Plan Note (Signed)
Check Cr; avoid NSAIDs; encouraged better water intake

## 2018-04-12 NOTE — Assessment & Plan Note (Signed)
Encouraged smoking cessation 

## 2018-04-12 NOTE — Progress Notes (Signed)
BP 108/78   Pulse 80   Temp 98.1 F (36.7 C) (Oral)   Resp 16   Ht 5\' 10"  (1.778 m)   Wt 164 lb (74.4 kg)   SpO2 97%   BMI 23.53 kg/m    Subjective:    Patient ID: Manuel [name], male    DOB: May 04, 1956, 62 y.o.   MRN: [id number]  HPI: Manuel [name] is a 62 y.o. male  Chief Complaint  Patient presents with  . Follow-up  . Headache    HPI Patient is here for f/u  He has high cholesterol; goal LDL was high; eating breakfast by spells; has chicken or chicken tenderes or shrimp or fish; does eat hamburgers in hamburger helper and spaghetti, eating burgers at [employer] LDL 127  He has daily headaches; takes tylenol; off and on for years; taking tylenol frequently but not every morning, then has OTC pain pill 500 mg and takes that occasionally; not a good water drinker he admits  Emphysema; smokes 1/2 ppd; closer to thinking about quitting; smokes some at home; cut off at 5 pm or 6 pm; coughs at times; has had chest CT done in February 2019; activity not limited by breathing  CAD and aortic athero; no chest pain; mother had an aneurysm  CKD stage 2; avoiding NSAIDs; not a good water drinker; GFR up to 67 last time  Healing up after lipoma surgery distal LEFT thigh; they are doing shots in the LEFT shoulder; talking about surgery, but odds are not as good at his age  PSA is high; managed by Zara Council; going to the bathroom okay  Low vitamin b12; level was only 276 in October Low vitamin D; energy level is poor  He used to be at the spine clinic in [place]; they did a MRI; he was supposed to see spine specialist, but they moved form [place]; bone is pinching against the nerve   Depression screen [facility] 2/9 04/12/2018 12/10/2017 08/24/2017 08/18/2017 05/07/2017  Decreased Interest 0 0 0 0 0  Down, Depressed, Hopeless 0 0 0 0 0  PHQ - 2 Score 0 0 0 0 0  Altered sleeping 0 - - - -  Tired, decreased energy 3 - - - -  Change in appetite 0 - - - -  Feeling  bad or failure about yourself  0 - - - -  Trouble concentrating 0 - - - -  Moving slowly or fidgety/restless 0 - - - -  Suicidal thoughts 0 - - - -  PHQ-9 Score 3 - - - -  Difficult doing work/chores Not difficult at all - - - -    Relevant past medical, surgical, family and social history reviewed Past Medical History:  Diagnosis Date  . Anemia   . Anxiety   . Aortic atherosclerosis ([place]) 01/11/2018   CT scan Feb 2019  . Asthma   . Bilateral carpal tunnel syndrome   . Bilateral hand numbness   . BPH without urinary obstruction   . Cervical spondylosis without myelopathy   . Complete tear of rotator cuff    bilateral  . COPD (chronic obstructive pulmonary disease) ([place])   . Depression   . Emphysema lung ([place]) 01/11/2018   Chest CT Feb 2019  . History of stomach ulcers   . HLD (hyperlipidemia)   . LVH (left ventricular hypertrophy)   . Personal history of tobacco use, presenting hazards to health 12/21/2015  . Poor dentition   . Sickle  cell trait ([place])   . Sleep apnea   . Smoker    Past Surgical History:  Procedure Laterality Date  . COLONOSCOPY WITH PROPOFOL N/A 12/25/2015   Procedure: COLONOSCOPY WITH PROPOFOL;  Surgeon: Lucilla Lame, MD;  Location: [facility] ENDOSCOPY;  Service: Endoscopy;  Laterality: N/A;  . CYSTOURETHROSCOPY  08/25/12   with ureteral cathization w/wo retrograde pyelogram  . ROTATOR CUFF REPAIR Right 01/25/15  . SHOULDER ARTHROSCOPY WITH SUBACROMIAL DECOMPRESSION Right 032416  . TRANSURETHRAL RESECTION OF PROSTATE  08/25/12   Family History  Problem Relation Age of Onset  . Aneurysm Mother   . Hypertension Mother   . Diabetes Father   . Hypertension Father   . Prostate cancer Father   . Cancer Maternal Aunt   . Cancer Maternal Uncle   . Diabetes Paternal Uncle   . Heart disease Cousin   . Prostate cancer Paternal Uncle   . COPD Neg Hx   . Stroke Neg Hx   . Kidney disease Neg Hx    Social History   Tobacco Use  . Smoking status: Current Every  Day Smoker    Packs/day: 0.50    Years: 35.00    Pack years: 17.50    Types: Cigarettes  . Smokeless tobacco: Never Used  Substance Use Topics  . Alcohol use: No  . 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 Allergies and medications reviewed  Review of Systems Per HPI unless specifically indicated above     Objective:    BP 108/78   Pulse 80   Temp 98.1 F (36.7 C) (Oral)   Resp 16   Ht 5\' 10"  (1.778 m)   Wt 164 lb (74.4 kg)   SpO2 97%   BMI 23.53 kg/m   Wt Readings from Last 3 Encounters:  04/20/18 164 lb 9.6 oz (74.7 kg)  04/12/18 164 lb (74.4 kg)  02/10/18 171 lb (77.6 kg)    Physical Exam  Constitutional: He appears well-developed and well-nourished. No distress.  HENT:  Head: Normocephalic and atraumatic.  Eyes: EOM are normal. No scleral icterus.  Neck: Decreased range of motion (with rotation left and right) present. No thyromegaly present.  Cardiovascular: Normal rate and regular rhythm.  Pulmonary/Chest: Effort normal and breath sounds normal.  Abdominal: Soft. Bowel sounds are normal. He exhibits no distension.  Musculoskeletal: He exhibits no edema.       Legs: Healing surgical site, c/d/i  Neurological: Coordination normal.  Biceps 5-/5 on the RIGHT  Skin: Skin is warm and dry. No pallor.  Psychiatric: He has a normal mood and affect. His behavior is normal. Judgment and thought content normal. His mood appears not anxious. He does not exhibit a depressed mood.       Assessment & Plan:   Problem List Items Addressed This Visit      Cardiovascular and Mediastinum   Coronary artery disease (Chronic)    Discussed risk of heart attack with smoking and high lipids      Aortic atherosclerosis (HCC) (Chronic)    Try to limit or avoid foods that come from cows and pigs; check lipids; goal LDL less than 70        Respiratory   Emphysema lung (HCC) (Chronic)    Urged smoking  cessation        Musculoskeletal and Integument   Cervical spondylosis without myelopathy    Refer to spine specialist; may be contributing to  headaches; reviewed the MRI from [facility]; see copy in [facility]      Relevant Orders   Ambulatory referral to Neurosurgery     Genitourinary   Chronic kidney disease (CKD), stage II (mild) (Chronic)    Check Cr; avoid NSAIDs; encouraged better water intake        Other   Medication monitoring encounter   Relevant Orders   COMPLETE METABOLIC PANEL WITH GFR (Completed)   Vitamin D deficiency    Check and replace      Relevant Orders   VITAMIN D 25 Hydroxy (Vit-D Deficiency, Fractures) (Completed)   Vitamin B12 deficiency    Check and replace      Relevant Orders   Vitamin B12 (Completed)   Sickle cell trait (HCC) (Chronic)    Reviewed last CBC      Hyperlipidemia (Chronic)    Goal LDL less than 70; discussed dietary changes; explained high lipids and poor diet could shorten his life      Relevant Orders   Lipid panel (Completed)   Current smoker    Encouraged smoking cessation      Chronic daily headache - Primary    Refer to neurologist; consider analgesic rebound; dehydration also a possibility      Relevant Orders   Ambulatory referral to Neurology    Other Visit Diagnoses    Pneumococcal vaccination indicated       Relevant Orders   Pneumococcal polysaccharide vaccine 23-valent greater than or equal to 2yo subcutaneous/IM (Completed)       Follow up plan: Return in about 3 months (around 07/13/2018) for follow-up visit with Dr. Sanda Klein.  An after-visit summary was printed and given to the patient at [facility].  Please see the patient instructions which may contain other information and recommendations beyond what is mentioned above in the assessment and plan.  No orders of the defined types were placed in this encounter.   Orders Placed This Encounter  Procedures  . Pneumococcal polysaccharide vaccine  23-valent greater than or equal to 2yo subcutaneous/IM  . COMPLETE METABOLIC PANEL WITH GFR  . Lipid panel  . VITAMIN D 25 Hydroxy (Vit-D Deficiency, Fractures)  . Vitamin B12  . Ambulatory referral to Neurology  . Ambulatory referral to Neurosurgery

## 2018-04-13 LAB — LIPID PANEL
CHOL/HDL RATIO: 4.5 (calc) (ref <5.0)
CHOLESTEROL: 192 mg/dL (ref <200)
HDL: 43 mg/dL (ref >40)
LDL CHOLESTEROL (CALC): 127 mg/dL — AB
Non-HDL Cholesterol (Calc): 149 mg/dL (calc) — ABNORMAL HIGH (ref <130)
TRIGLYCERIDES: 116 mg/dL (ref <150)

## 2018-04-13 LAB — COMPLETE METABOLIC PANEL WITH GFR
AG Ratio: 1.4 (calc) (ref 1.0–2.5)
ALT: 10 U/L (ref 9–46)
AST: 18 U/L (ref 10–35)
Albumin: 3.9 g/dL (ref 3.6–5.1)
Alkaline phosphatase (APISO): 61 U/L (ref 40–115)
BUN/Creatinine Ratio: 8 (calc) (ref 6–22)
BUN: 12 mg/dL (ref 7–25)
CHLORIDE: 103 mmol/L (ref 98–110)
CO2: 28 mmol/L (ref 20–32)
CREATININE: 1.51 mg/dL — AB (ref 0.70–1.25)
Calcium: 9.3 mg/dL (ref 8.6–10.3)
GFR, EST AFRICAN AMERICAN: 57 mL/min/{1.73_m2} — AB (ref >=60)
GFR, Est Non African American: 49 mL/min/{1.73_m2} — ABNORMAL LOW (ref >=60)
GLUCOSE: 76 mg/dL (ref 65–139)
Globulin: 2.7 g/dL (calc) (ref 1.9–3.7)
Potassium: 4 mmol/L (ref 3.5–5.3)
Sodium: 139 mmol/L (ref 135–146)
Total Bilirubin: 0.4 mg/dL (ref 0.2–1.2)
Total Protein: 6.6 g/dL (ref 6.1–8.1)

## 2018-04-13 LAB — VITAMIN D 25 HYDROXY (VIT D DEFICIENCY, FRACTURES): VIT D 25 HYDROXY: 24 ng/mL — AB (ref 30–100)

## 2018-04-13 LAB — VITAMIN B12: VITAMIN B 12: 208 pg/mL (ref 200–1100)

## 2018-04-14 ENCOUNTER — Other Ambulatory Visit: Payer: Self-pay | Admitting: Family Medicine

## 2018-04-14 DIAGNOSIS — E538 Deficiency of other specified B group vitamins: Secondary | ICD-10-CM

## 2018-04-14 MED ORDER — VITAMIN D (ERGOCALCIFEROL) 1.25 MG (50000 UNIT) PO CAPS: 50000.0000 [IU] | ORAL_CAPSULE | ORAL | 0 refills | Status: AC

## 2018-04-14 MED ORDER — CYANOCOBALAMIN 1000 MCG/ML IJ SOLN: 1000.0000 ug | INTRAMUSCULAR | Status: DC

## 2018-04-14 MED ORDER — ATORVASTATIN CALCIUM 40 MG PO TABS: 40.0000 mg | ORAL_TABLET | Freq: Every day | ORAL | 5 refills | Status: DC

## 2018-04-14 NOTE — Progress Notes (Signed)
Increase statin Start vitamin B12 1000 mcg IM monthly Start vitamin D weekly for 4 weeks then OTC

## 2018-04-19 NOTE — Progress Notes (Signed)
2:49 PM   Manuel [name] 1956-03-28 [id number]  Referring provider: Arnetha Courser, MD [address] [place], [place] [address]  Chief Complaint  Patient presents with  . Benign Prostatic Hypertrophy    HPI: Patient is a 62 year old African-American male with presents with an elevated PSA, a history of gross hematuria, erectile dysfunction and BPH with LUTS who presents today for a 6 months follow up.    Elevated PSA Patient's PSA has been variable over the last two years ranging from 4 to 6.  His free and total PSA noted a 24% probability of having prostate cancer.  PCPT risk notes a 25% chance of a high grade cancer, 32% low grade cancer and a 43% chance of no cancer found on biopsy.  His father and paternal grandfather had been diagnosed with prostate cancer.  He underwent a TRUSPBx of prostate on 10/07/2016 with Dr. Noah Delaine.  Findings were 51.7 gram prostate and pathology was negative for malignancy.  PSA was 5.1 ng/mL in 03/2018.        History of gross hematuria Patient had an episode of clot retention in 2013.  He underwent a CT Urogram, cystoscopy with bilateral retrogrades and ultimately a TURP with Dr. Darcus Austin at [facility].  No malignancies were discovered.  Hematuria was from his enlarged, friable prostate.  He has not had any gross hematuria since that time.  A too small to characterize lesion was seen in the left midpole of the kidney on the CT Urogram in 2013.  Renal ultrasound completed on 02/14/2016 noted no renal masses, no hydronephrosis and an enlarged prostate.  He denies any gross hematuria.  His UA today is negative.    Erectile dysfunction His SHIM score is 13, which is mild to moderate erectile dysfunction.   His previous SHIM score was 18.  He has been having difficulty with erections since starting the finasteride.   His major complaint is now having the confidence to achieve an erections.  His libido is preserved.   His risk factors for ED are  hyperlipidemia, depression, age, BPH and smoking.  He denies any painful erections or curvatures with his erections.   He would like to have a prescription for sildenafil.   SHIM    Row Name 04/20/18 1412         SHIM: Over the last 6 months:   How do you rate your confidence that you could get and keep an erection?  Moderate     When you had erections with sexual stimulation, how often were your erections hard enough for penetration (entering your partner)?  A Few Times (much less than half the time)     During sexual intercourse, how often were you able to maintain your erection after you had penetrated (entered) your partner?  A Few Times (much less than half the time)     During sexual intercourse, how difficult was it to maintain your erection to completion of intercourse?  Very Difficult     When you attempted sexual intercourse, how often was it satisfactory for you?  Most Times (much more than half the time)       SHIM Total Score   SHIM  13        Score: 1-7 Severe ED 8-11 Moderate ED 12-16 Mild-Moderate ED 17-21 Mild ED 22-25 No ED   BPH WITH LUTS His IPSS score today is 8, which is moderate lower urinary tract symptomatology. He is mostly dissatisfied  with his quality life due to his urinary symptoms.  His previous I PSS score was 5/1.  His previous PVR was 22 mL.  His major complaint today is nocturia.  He doesn't do that he does not drink a lot of fluids during the day.   He has been taking his tamsulosin and finasteride daily.   He denies any dysuria, hematuria or suprapubic pain.  His has had a TURP.  He also denies any recent fevers, chills, nausea or vomiting.   He has a family history of PCa, with father and paternal grand father with prostate cancer.     IPSS    Row Name 04/20/18 1400         International Prostate Symptom Score   How often have you had the sensation of not emptying your bladder?  About half the time     How often have you had to urinate less  than every two hours?  Less than half the time     How often have you found you stopped and started again several times when you urinated?  Less than half the time     How often have you found it difficult to postpone urination?  Not at All     How often have you had a weak urinary stream?  Not at All     How often have you had to strain to start urination?  Not at All     How many times did you typically get up at night to urinate?  1 Time     Total IPSS Score  8       Quality of Life due to urinary symptoms   If you were to spend the rest of your life with your urinary condition just the way it is now how would you feel about that?  Mostly Disatisfied        Score:  1-7 Mild 8-19 Moderate 20-35 Severe    PMH: Past Medical History:  Diagnosis Date  . Anemia   . Anxiety   . Aortic atherosclerosis ([place]) 01/11/2018   CT scan Feb 2019  . Asthma   . Bilateral carpal tunnel syndrome   . Bilateral hand numbness   . BPH without urinary obstruction   . Cervical spondylosis without myelopathy   . Complete tear of rotator cuff    bilateral  . COPD (chronic obstructive pulmonary disease) ([place])   . Depression   . Emphysema lung ([place]) 01/11/2018   Chest CT Feb 2019  . History of stomach ulcers   . HLD (hyperlipidemia)   . LVH (left ventricular hypertrophy)   . Personal history of tobacco use, presenting hazards to health 12/21/2015  . Poor dentition   . Sickle cell trait ([place])   . Sleep apnea   . Smoker     Surgical History: Past Surgical History:  Procedure Laterality Date  . COLONOSCOPY WITH PROPOFOL N/A 12/25/2015   Procedure: COLONOSCOPY WITH PROPOFOL;  Surgeon: Lucilla Lame, MD;  Location: [facility] ENDOSCOPY;  Service: Endoscopy;  Laterality: N/A;  . CYSTOURETHROSCOPY  08/25/12   with ureteral cathization w/wo retrograde pyelogram  . ROTATOR CUFF REPAIR Right 01/25/15  . SHOULDER ARTHROSCOPY WITH SUBACROMIAL DECOMPRESSION Right 032416  . TRANSURETHRAL RESECTION OF PROSTATE   08/25/12    Home Medications:  Allergies as of 04/20/2018      Reactions   Penicillin G Other (See Comments)      Medication List        Accurate as  of 04/20/18  2:49 PM. Always use your most recent med list.          aspirin EC 81 MG tablet Take 1 tablet (81 mg total) by mouth daily.   atorvastatin 40 MG tablet Commonly known as:  LIPITOR Take 1 tablet (40 mg total) by mouth at bedtime.   buPROPion 150 MG 24 hr tablet Commonly known as:  WELLBUTRIN XL Take 1 tablet (150 mg total) by mouth daily.   finasteride 5 MG tablet Commonly known as:  PROSCAR Take 1 tablet (5 mg total) by mouth daily.   nicotine 10 MG inhaler Commonly known as:  NICOTROL Inhale 1 Cartridge (1 continuous puffing total) into the lungs as needed for smoking cessation.   sertraline 50 MG tablet Commonly known as:  ZOLOFT Take 1 tablet (50 mg total) by mouth daily.   sildenafil 20 MG tablet Commonly known as:  REVATIO Take 3 to 5 tablets two hours before intercouse on an empty stomach.  Do not take with nitrates.   tamsulosin 0.4 MG Caps capsule Commonly known as:  FLOMAX Take 1 capsule (0.4 mg total) by mouth daily.   vitamin B-12 500 MCG tablet Commonly known as:  CYANOCOBALAMIN Take 1 tablet (500 mcg total) by mouth daily.   Vitamin D (Ergocalciferol) 50000 units Caps capsule Commonly known as:  DRISDOL Take 1 capsule (50,000 Units total) by mouth every 7 (seven) days.       Allergies:  Allergies  Allergen Reactions  . Penicillin G Other (See Comments)    Family History: Family History  Problem Relation Age of Onset  . Aneurysm Mother   . Hypertension Mother   . Diabetes Father   . Hypertension Father   . Prostate cancer Father   . Cancer Maternal Aunt   . Cancer Maternal Uncle   . Diabetes Paternal Uncle   . Heart disease Cousin   . Prostate cancer Paternal Uncle   . COPD Neg Hx   . Stroke Neg Hx   . Kidney disease Neg Hx     Social History:  reports that he has  been smoking cigarettes.  He has a 17.50 pack-year smoking history. He has never used smokeless tobacco. He reports that he does not drink alcohol or use drugs.  ROS: UROLOGY Frequent Urination?: No Hard to postpone urination?: No Burning/pain with urination?: No Get up at night to urinate?: Yes Leakage of urine?: No Urine stream starts and stops?: No Trouble starting stream?: No Do you have to strain to urinate?: No Blood in urine?: No Urinary tract infection?: No Sexually transmitted disease?: No Injury to kidneys or bladder?: No Painful intercourse?: No Weak stream?: No Erection problems?: No Penile pain?: No  Gastrointestinal Nausea?: No Vomiting?: No Indigestion/heartburn?: No Diarrhea?: No Constipation?: No  Constitutional Fever: No Night sweats?: No Weight loss?: No Fatigue?: No  Skin Skin rash/lesions?: No Itching?: No  Eyes Blurred vision?: No Double vision?: No  Ears/Nose/Throat Sore throat?: No Sinus problems?: No  Hematologic/Lymphatic Swollen glands?: No Easy bruising?: No  Cardiovascular Leg swelling?: Yes Chest pain?: No  Respiratory Cough?: Yes Shortness of breath?: Yes  Endocrine Excessive thirst?: Yes  Musculoskeletal Back pain?: Yes Joint pain?: Yes  Neurological Headaches?: No Dizziness?: No  Psychologic Depression?: Yes Anxiety?: Yes  Physical Exam: BP 119/78 (BP Location: Right Arm, Patient Position: Sitting, Cuff Size: Normal)   Pulse 90   Ht 5\' 10"  (1.778 m)   Wt 164 lb 9.6 oz (74.7 kg)   BMI 23.62 kg/m  Constitutional: Well nourished. Alert and oriented, No acute distress. HEENT: [place] AT, moist mucus membranes. Trachea midline, no masses. Cardiovascular: No clubbing, cyanosis, or edema. Respiratory: Normal respiratory effort, no increased work of breathing. GI: Abdomen is soft, non tender, non distended, no abdominal masses. Liver and spleen not palpable.  No hernias appreciated.  Stool sample for occult  testing is not indicated.   GU: No CVA tenderness.  No bladder fullness or masses.  Patient with circumcised phallus. Urethral meatus is patent.  No penile discharge. No penile lesions or rashes. Scrotum without lesions, cysts, rashes and/or edema.  Testicles are located scrotally bilaterally. No masses are appreciated in the testicles. Left and right epididymis are normal. Rectal: Patient with  normal sphincter tone. Anus and perineum without scarring or rashes. No rectal masses are appreciated. Prostate is approximately 55 grams, no nodules are appreciated. Seminal vesicles are normal. Skin: No rashes, bruises or suspicious lesions. Lymph: No cervical or inguinal adenopathy. Neurologic: Grossly intact, no focal deficits, moving all 4 extremities. Psychiatric: Normal mood and affect.   Laboratory Data: Lab Results  Component Value Date   WBC 5.7 08/24/2017   HGB 13.4 08/24/2017   HCT 40.4 08/24/2017   MCV 75.7 (L) 08/24/2017   PLT 259 08/24/2017    Lab Results  Component Value Date   CREATININE 1.51 (H) 04/12/2018  PSA History  4.43 ng/mL on 12/01/2013 (8.86) -patient on finasteride inconsistently   4.67 ng/mL on 08/28/2014 (9.34)  4.80 ng/mL on 11/02/2015 (9.6)  6.10 ng/mL on 02/04/2016  (12.20) - bx negative   5.10 ng/mL on 02/19/2016 (10.20)  4.70 ng/mL on 05/08/2016 (9.4)  4.10 ng/mL on 01/05/2017 (8.20)  4.8 ng/mL in 08/2017 (9.6)  5.1 ng/mL in 03/2018 (10.2)   Results for orders placed or performed in visit on 04/12/18  COMPLETE METABOLIC PANEL WITH GFR  Result Value Ref Range   Glucose, Bld 76 65 - 139 mg/dL   BUN 12 7 - 25 mg/dL   Creat 1.51 (H) 0.70 - 1.25 mg/dL   GFR, Est Non African American 49 (L) > OR = 60 mL/min/1.85m2   GFR, Est African American 57 (L) > OR = 60 mL/min/1.29m2   BUN/Creatinine Ratio 8 6 - 22 (calc)   Sodium 139 135 - 146 mmol/L   Potassium 4.0 3.5 - 5.3 mmol/L   Chloride 103 98 - 110 mmol/L   CO2 28 20 - 32 mmol/L   Calcium 9.3 8.6 - 10.3  mg/dL   Total Protein 6.6 6.1 - 8.1 g/dL   Albumin 3.9 3.6 - 5.1 g/dL   Globulin 2.7 1.9 - 3.7 g/dL (calc)   AG Ratio 1.4 1.0 - 2.5 (calc)   Total Bilirubin 0.4 0.2 - 1.2 mg/dL   Alkaline phosphatase (APISO) 61 40 - 115 U/L   AST 18 10 - 35 U/L   ALT 10 9 - 46 U/L  Lipid panel  Result Value Ref Range   Cholesterol 192 <200 mg/dL   HDL 43 >40 mg/dL   Triglycerides 116 <150 mg/dL   LDL Cholesterol (Calc) 127 (H) mg/dL (calc)   Total CHOL/HDL Ratio 4.5 <5.0 (calc)   Non-HDL Cholesterol (Calc) 149 (H) <130 mg/dL (calc)  VITAMIN D 25 Hydroxy (Vit-D Deficiency, Fractures)  Result Value Ref Range   Vit D, 25-Hydroxy 24 (L) 30 - 100 ng/mL  Vitamin B12  Result Value Ref Range   Vitamin B-12 208 200 - 1,100 pg/mL   I have reviewed the labs.  Assessment & Plan:  1.  Elevated PSA  - PSA is at 5.1 (10.2) - is rising will shorten monitoring interval at this time  - RTC in 3 months for PSA and exam  2. History of gross hematuria  - UA is negative  - patient does not endorse gross hematuria  - RTC in 6 months for recheck on UA  3. Erectile dysfunction  - SHIM score is 13, it is worsening   - given a script for sildenafil 20 mg, 3 to 5 tablets two hours prior to intercourse on an empty stomach, # 50; he is warned not to take medications that contain nitrates.  I also advised him of the side effects, such as: headache, flushing, dyspepsia, abnormal vision, nasal congestion, back pain, myalgia, nausea, dizziness, and rash.  - RTC in 6 months for repeat SHIM score and exam   4. BPH with LUTS  - IPSS score is 8/4, it is worsening   - Continue conservative management, avoiding bladder irritants and timed voiding's  - most bothersome symptoms is/are Incomplete emptying and frequency  - Continue tamsulosin 0.4 mg daily and finasteride 5 mg daily  - RTC in 6 months for IPSS, PSA and exam    Return in about 3 months (around 07/21/2018) for PSA and exam.  These notes generated with voice  recognition software. I apologize for typographical errors.  Zara Council, PA-C  [facility] [address] [place], [place] [address] [phone]

## 2018-04-20 ENCOUNTER — Ambulatory Visit: Payer: Medicaid Other | Admitting: Urology

## 2018-04-20 ENCOUNTER — Encounter: Payer: Self-pay | Admitting: Urology

## 2018-04-20 VITALS — BP 119/78 | HR 90 | Ht 70.0 in | Wt 164.6 lb

## 2018-04-20 DIAGNOSIS — N138 Other obstructive and reflux uropathy: Secondary | ICD-10-CM

## 2018-04-20 DIAGNOSIS — Z87448 Personal history of other diseases of urinary system: Secondary | ICD-10-CM | POA: Diagnosis not present

## 2018-04-20 DIAGNOSIS — N529 Male erectile dysfunction, unspecified: Secondary | ICD-10-CM | POA: Diagnosis not present

## 2018-04-20 DIAGNOSIS — R972 Elevated prostate specific antigen [PSA]: Secondary | ICD-10-CM | POA: Diagnosis not present

## 2018-04-20 DIAGNOSIS — N401 Enlarged prostate with lower urinary tract symptoms: Secondary | ICD-10-CM | POA: Diagnosis not present

## 2018-04-20 LAB — URINALYSIS, COMPLETE
Bilirubin, UA: NEGATIVE
Glucose, UA: NEGATIVE
KETONES UA: NEGATIVE
LEUKOCYTES UA: NEGATIVE
NITRITE UA: NEGATIVE
Protein, UA: NEGATIVE
RBC UA: NEGATIVE
UUROB: 0.2 mg/dL (ref 0.2–1.0)
pH, UA: 6 (ref 5.0–7.5)

## 2018-04-20 MED ORDER — SILDENAFIL CITRATE 20 MG PO TABS: ORAL_TABLET | ORAL | 3 refills | Status: DC

## 2018-04-20 MED ORDER — TAMSULOSIN HCL 0.4 MG PO CAPS: 0.4000 mg | ORAL_CAPSULE | Freq: Every day | ORAL | 3 refills | Status: DC

## 2018-04-20 MED ORDER — FINASTERIDE 5 MG PO TABS: 5.0000 mg | ORAL_TABLET | Freq: Every day | ORAL | 3 refills | Status: DC

## 2018-05-17 ENCOUNTER — Other Ambulatory Visit: Payer: Self-pay | Admitting: Neurology

## 2018-05-17 DIAGNOSIS — M7918 Myalgia, other site: Secondary | ICD-10-CM | POA: Insufficient documentation

## 2018-05-17 DIAGNOSIS — G44221 Chronic tension-type headache, intractable: Secondary | ICD-10-CM

## 2018-05-17 DIAGNOSIS — M5481 Occipital neuralgia: Secondary | ICD-10-CM | POA: Insufficient documentation

## 2018-05-29 ENCOUNTER — Ambulatory Visit
Admission: RE | Admit: 2018-05-29 | Discharge: 2018-05-29 | Disposition: A | Discharge status: Home / Self Care | Payer: Medicaid Other | Source: Ambulatory Visit | Attending: Neurology | Admitting: Neurology

## 2018-05-29 DIAGNOSIS — M7918 Myalgia, other site: Secondary | ICD-10-CM | POA: Diagnosis present

## 2018-05-29 DIAGNOSIS — G44221 Chronic tension-type headache, intractable: Secondary | ICD-10-CM | POA: Diagnosis present

## 2018-05-29 DIAGNOSIS — G44201 Tension-type headache, unspecified, intractable: Secondary | ICD-10-CM | POA: Diagnosis not present

## 2018-05-29 DIAGNOSIS — H538 Other visual disturbances: Secondary | ICD-10-CM | POA: Diagnosis not present

## 2018-05-29 DIAGNOSIS — R9082 White matter disease, unspecified: Secondary | ICD-10-CM | POA: Insufficient documentation

## 2018-05-29 DIAGNOSIS — M5481 Occipital neuralgia: Secondary | ICD-10-CM | POA: Diagnosis present

## 2018-05-29 MED ORDER — GADOBENATE DIMEGLUMINE 529 MG/ML IV SOLN
15.0000 mL | Freq: Once | INTRAVENOUS | Status: AC | PRN
  Administered 2018-05-29: 15 mL via INTRAVENOUS

## 2018-07-15 ENCOUNTER — Ambulatory Visit: Payer: Medicaid Other | Admitting: Family Medicine

## 2018-07-15 ENCOUNTER — Encounter: Payer: Self-pay | Admitting: Family Medicine

## 2018-07-15 ENCOUNTER — Other Ambulatory Visit: Payer: Self-pay | Admitting: Family Medicine

## 2018-07-15 ENCOUNTER — Ambulatory Visit
Admission: RE | Admit: 2018-07-15 | Discharge: 2018-07-15 | Disposition: A | Discharge status: Home / Self Care | Payer: Medicaid Other | Source: Ambulatory Visit | Attending: Family Medicine | Admitting: Family Medicine

## 2018-07-15 VITALS — BP 132/84 | HR 74 | Temp 98.1°F | Resp 16 | Ht 70.0 in | Wt 167.1 lb

## 2018-07-15 DIAGNOSIS — J439 Emphysema, unspecified: Secondary | ICD-10-CM | POA: Diagnosis not present

## 2018-07-15 DIAGNOSIS — E538 Deficiency of other specified B group vitamins: Secondary | ICD-10-CM | POA: Diagnosis not present

## 2018-07-15 DIAGNOSIS — N183 Chronic kidney disease, stage 3 unspecified: Secondary | ICD-10-CM

## 2018-07-15 DIAGNOSIS — M79602 Pain in left arm: Secondary | ICD-10-CM | POA: Insufficient documentation

## 2018-07-15 DIAGNOSIS — I700 Atherosclerosis of aorta: Secondary | ICD-10-CM

## 2018-07-15 DIAGNOSIS — M75122 Complete rotator cuff tear or rupture of left shoulder, not specified as traumatic: Secondary | ICD-10-CM

## 2018-07-15 DIAGNOSIS — R972 Elevated prostate specific antigen [PSA]: Secondary | ICD-10-CM

## 2018-07-15 DIAGNOSIS — F339 Major depressive disorder, recurrent, unspecified: Secondary | ICD-10-CM

## 2018-07-15 DIAGNOSIS — Z5181 Encounter for therapeutic drug level monitoring: Secondary | ICD-10-CM

## 2018-07-15 DIAGNOSIS — E559 Vitamin D deficiency, unspecified: Secondary | ICD-10-CM

## 2018-07-15 DIAGNOSIS — Z23 Encounter for immunization: Secondary | ICD-10-CM | POA: Diagnosis not present

## 2018-07-15 DIAGNOSIS — F17200 Nicotine dependence, unspecified, uncomplicated: Secondary | ICD-10-CM | POA: Diagnosis not present

## 2018-07-15 DIAGNOSIS — E782 Mixed hyperlipidemia: Secondary | ICD-10-CM

## 2018-07-15 MED ORDER — CYANOCOBALAMIN 1000 MCG/ML IJ SOLN
1000.0000 ug | Freq: Once | INTRAMUSCULAR | Status: AC
  Administered 2018-07-15: 1000 ug via INTRAMUSCULAR

## 2018-07-15 NOTE — Progress Notes (Signed)
BP 132/84   Pulse 74   Temp 98.1 F (36.7 C) (Oral)   Resp 16   Ht 5\' 10"  (1.778 m)   Wt 167 lb 1.6 oz (75.8 kg)   SpO2 98%   BMI 23.98 kg/m    Subjective:    Patient ID: Manuel [name], male    DOB: [birth year], 62 y.o.   MRN: [id number]  HPI: Manuel [name] is a 62 y.o. male  Chief Complaint  Patient presents with  . Follow-up    HPI He is here for f/u He went to do the xrays for his neck; they told him he has some artery issues He saw a doctor about his left shoulder and he has a torn rotator cuff, torn so bad that he has no muscle tissues; to fix it, they'll have to dislocate his shoulder, put in a new cuff, and then put it back together again; he doesn't want to take a chance he says at his age; he thinks he pulled something in his arm; lifting something drink crates at work; he hasn't filed anything yet; "I pulled something" "It hurts so bad"; he says he doesn't know that it was at work, it could have been done landscaping; he feels something from the forearm up under the elbow and under the bicep; I explained as he started to talk about this that if this is worker's comp, he needs to NOT talk to me and talk to his employer; he declined and wants me to check it out; he says this has been about a month;   Mood is okay, but just tired; ready for winter, wants to settle down some; working long hours, that will slow down after summer  High cholesterol; aortic atherosclerosis; on statin; eating [employer] salads, occasional meat but not like they were; cutting down on fast food burgers Lab Results  Component Value Date   CHOL 193 07/15/2018   HDL 42 07/15/2018   LDLCALC 123 (H) 07/15/2018   TRIG 162 (H) 07/15/2018   CHOLHDL 4.6 07/15/2018   Seeing urologist for prostate  Getting B12 injections here, but did not come back for shots 2 and 3; he did get the first shot in June he says  Vitamin D deficiency; taking supplement  Emphysema; able to breathe okay, but says he  would like that rechecked; still smoking, 1/2 ppd  Dr. Manuella Ghazi did a brain scan  Depression; recurrent issue, major dep d/o; no thoughts of SI or HI; wishes to stay on current doses of medicines  Depression screen [facility] 2/9 07/15/2018 04/12/2018 12/10/2017 08/24/2017 08/18/2017  Decreased Interest 0 0 0 0 0  Down, Depressed, Hopeless 2 0 0 0 0  PHQ - 2 Score 2 0 0 0 0  Altered sleeping 3 0 - - -  Tired, decreased energy 2 3 - - -  Change in appetite 1 0 - - -  Feeling bad or failure about yourself  0 0 - - -  Trouble concentrating 1 0 - - -  Moving slowly or fidgety/restless 0 0 - - -  Suicidal thoughts 0 0 - - -  PHQ-9 Score 9 3 - - -  Difficult doing work/chores Not difficult at all Not difficult at all - - -    Relevant past medical, surgical, family and social history reviewed Past Medical History:  Diagnosis Date  . Anemia   . Anxiety   . Aortic atherosclerosis ([place]) 01/11/2018   CT scan Feb 2019  .  Asthma   . Bilateral carpal tunnel syndrome   . Bilateral hand numbness   . BPH without urinary obstruction   . Cervical spondylosis without myelopathy   . Complete tear of rotator cuff    bilateral  . COPD (chronic obstructive pulmonary disease) ([place])   . Depression   . Emphysema lung ([place]) 01/11/2018   Chest CT Feb 2019  . History of stomach ulcers   . HLD (hyperlipidemia)   . LVH (left ventricular hypertrophy)   . Personal history of tobacco use, presenting hazards to health 12/21/2015  . Poor dentition   . Sickle cell trait ([place])   . Sleep apnea   . Smoker    Past Surgical History:  Procedure Laterality Date  . COLONOSCOPY WITH PROPOFOL N/A 12/25/2015   Procedure: COLONOSCOPY WITH PROPOFOL;  Surgeon: Lucilla Lame, MD;  Location: [facility] ENDOSCOPY;  Service: Endoscopy;  Laterality: N/A;  . CYSTOURETHROSCOPY  08/25/12   with ureteral cathization w/wo retrograde pyelogram  . ROTATOR CUFF REPAIR Right 01/25/15  . SHOULDER ARTHROSCOPY WITH SUBACROMIAL DECOMPRESSION Right 032416    . TRANSURETHRAL RESECTION OF PROSTATE  08/25/12   Family History  Problem Relation Age of Onset  . Aneurysm Mother   . Hypertension Mother   . Diabetes Father   . Hypertension Father   . Prostate cancer Father   . Lupus Father   . Cancer Maternal Aunt   . Cancer Maternal Uncle   . Diabetes Paternal Uncle   . Heart disease Cousin   . Prostate cancer Paternal Uncle   . COPD Neg Hx   . Stroke Neg Hx   . Kidney disease Neg Hx    Social History   Tobacco Use  . Smoking status: Current Every Day Smoker    Packs/day: 0.50    Years: 35.00    Pack years: 17.50    Types: Cigarettes  . Smokeless tobacco: Never Used  Substance Use Topics  . Alcohol use: No  . 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 Allergies and medications reviewed  Review of Systems Per HPI unless specifically indicated above     Objective:    BP 132/84   Pulse 74   Temp 98.1 F (36.7 C) (Oral)   Resp 16   Ht 5\' 10"  (1.778 m)   Wt 167 lb 1.6 oz (75.8 kg)   SpO2 98%   BMI 23.98 kg/m   Wt Readings from Last 3 Encounters:  07/15/18 167 lb 1.6 oz (75.8 kg)  04/20/18 164 lb 9.6 oz (74.7 kg)  04/12/18 164 lb (74.4 kg)    Physical Exam  Constitutional: He appears well-developed and well-nourished. No distress.  HENT:  Head: Normocephalic and atraumatic.  Eyes: EOM are normal. No scleral icterus.  Neck: No thyromegaly present.  Cardiovascular: Normal rate and regular rhythm.  Pulmonary/Chest: Effort normal and breath sounds normal.  Abdominal: Soft. Bowel sounds are normal. He exhibits no distension.  Musculoskeletal: He exhibits no edema.  Cord like structure felt along medial LEFT arm, from mid forearm up into the axilla  Neurological: Coordination normal.  Skin: Skin is warm and dry. No pallor.  Psychiatric: He has a normal mood and affect. His behavior is normal. Judgment and thought content normal.      Assessment & Plan:    Problem List Items Addressed This Visit      Cardiovascular and Mediastinum   Aortic atherosclerosis ([place]) (Chronic)  Check lipids today and goal LDL is less than 70; limit sat fats; try to quit smoking      Relevant Orders   Lipid panel (Completed)     Respiratory   Emphysema lung (HCC) (Chronic)    Urged smoking cessation        Musculoskeletal and Integument   Complete rotator cuff rupture of left shoulder    Gave patient name of his orthopaedist so he can contact him for further help        Other   Medication monitoring encounter   Relevant Orders   COMPLETE METABOLIC PANEL WITH GFR (Completed)   Vitamin D deficiency    Continue supplementation      Vitamin B12 deficiency    Give 1000 mcg B12 IM in the right arm today      Relevant Medications   cyanocobalamin ((VITAMIN B-12)) injection 1,000 mcg (Completed)   Recurrent depression ([place])    Continue current meds      Hyperlipidemia (Chronic)    Check lipids today      Relevant Orders   Lipid panel (Completed)   Current smoker    Encouraged smoking cessation       Other Visit Diagnoses    Pain of left upper extremity    -  Primary   Relevant Orders   US Venous Img Upper Uni Left (Completed)   Chronic kidney disease, stage III (moderate) ([place])       Need for influenza vaccination       Relevant Orders   Flu Vaccine QUAD 6+ mos PF IM (Fluarix Quad PF) (Completed)       Follow up plan: Return in about 1 month (around 08/14/2018) for follow-up visit with Dr. Sanda Klein.  An after-visit summary was printed and given to the patient at [place].  Please see the patient instructions which may contain other information and recommendations beyond what is mentioned above in the assessment and plan.  Meds ordered this encounter  Medications  . cyanocobalamin ((VITAMIN B-12)) injection 1,000 mcg    Orders Placed This Encounter  Procedures  . US Venous Img Upper Uni Left  . Flu Vaccine QUAD 6+ mos PF IM  (Fluarix Quad PF)  . Lipid panel  . COMPLETE METABOLIC PANEL WITH GFR

## 2018-07-15 NOTE — Assessment & Plan Note (Signed)
Give 1000 mcg B12 IM in the right arm today

## 2018-07-15 NOTE — Assessment & Plan Note (Signed)
Check lipids today and goal LDL is less than 70; limit sat fats; try to quit smoking

## 2018-07-15 NOTE — Assessment & Plan Note (Signed)
Check lipids today 

## 2018-07-15 NOTE — Assessment & Plan Note (Signed)
Gave patient name of his orthopaedist so he can contact him for further help

## 2018-07-15 NOTE — Assessment & Plan Note (Signed)
Urged smoking cessation 

## 2018-07-15 NOTE — Assessment & Plan Note (Signed)
Continue current meds 

## 2018-07-15 NOTE — Assessment & Plan Note (Signed)
Continue supplementation  ?

## 2018-07-15 NOTE — Assessment & Plan Note (Signed)
Encouraged smoking cessation 

## 2018-07-15 NOTE — Patient Instructions (Addendum)
Dr. Thornton Park is your orthopaedist for your shoulder He is at [facility]  Let's get labs today and an ultrasound of your LEFT arm  Return in one month for visit and B12 shot  I do encourage you to quit smoking Call [phone] to sign up for smoking cessation classes You can call 1-800-QUIT-NOW to talk with a smoking cessation coach   Steps to Quit Smoking Smoking tobacco can be bad for your health. It can also affect almost every organ in your body. Smoking puts you and people around you at risk for many serious long-lasting (chronic) diseases. Quitting smoking is hard, but it is one of the best things that you can do for your health. It is never too late to quit. What are the benefits of quitting smoking? When you quit smoking, you lower your risk for getting serious diseases and conditions. They can include:  Lung cancer or lung disease.  Heart disease.  Stroke.  Heart attack.  Not being able to have children (infertility).  Weak bones (osteoporosis) and broken bones (fractures).  If you have coughing, wheezing, and shortness of breath, those symptoms may get better when you quit. You may also get sick less often. If you are pregnant, quitting smoking can help to lower your chances of having a baby of low birth weight. What can I do to help me quit smoking? Talk with your doctor about what can help you quit smoking. Some things you can do (strategies) include:  Quitting smoking totally, instead of slowly cutting back how much you smoke over a period of time.  Going to in-person counseling. You are more likely to quit if you go to many counseling sessions.  Using resources and support systems, such as: ? Database administrator with a Social worker. ? Phone quitlines. ? Careers information officer. ? Support groups or group counseling. ? Text messaging programs. ? Mobile phone apps or applications.  Taking medicines. Some of these medicines may have nicotine in them. If you  are pregnant or breastfeeding, do not take any medicines to quit smoking unless your doctor says it is okay. Talk with your doctor about counseling or other things that can help you.  Talk with your doctor about using more than one strategy at the same time, such as taking medicines while you are also going to in-person counseling. This can help make quitting easier. What things can I do to make it easier to quit? Quitting smoking might feel very hard at first, but there is a lot that you can do to make it easier. Take these steps:  Talk to your family and friends. Ask them to support and encourage you.  Call phone quitlines, reach out to support groups, or work with a Social worker.  Ask people who smoke to not smoke around you.  Avoid places that make you want (trigger) to smoke, such as: ? Bars. ? Parties. ? Smoke-break areas at work.  Spend time with people who do not smoke.  Lower the stress in your life. Stress can make you want to smoke. Try these things to help your stress: ? Getting regular exercise. ? Deep-breathing exercises. ? Yoga. ? Meditating. ? Doing a body scan. To do this, close your eyes, focus on one area of your body at a time from head to toe, and notice which parts of your body are tense. Try to relax the muscles in those areas.  Download or buy apps on your mobile phone or tablet that can help you  stick to your quit plan. There are many free apps, such as QuitGuide from the [employer] Office manager for Disease Control and Prevention). You can find more support from smokefree.gov and other websites.  This information is not intended to replace advice given to you by your health care provider. Make sure you discuss any questions you have with your health care provider. Document Released: 08/16/2009 Document Revised: 06/17/2016 Document Reviewed: 03/06/2015 Elsevier Interactive Patient Education  2018 [employer].

## 2018-07-16 LAB — COMPLETE METABOLIC PANEL WITH GFR
AG Ratio: 1.4 (calc) (ref 1.0–2.5)
ALT: 8 U/L — AB (ref 9–46)
AST: 15 U/L (ref 10–35)
Albumin: 3.8 g/dL (ref 3.6–5.1)
Alkaline phosphatase (APISO): 69 U/L (ref 40–115)
BUN/Creatinine Ratio: 7 (calc) (ref 6–22)
BUN: 11 mg/dL (ref 7–25)
CALCIUM: 9 mg/dL (ref 8.6–10.3)
CO2: 30 mmol/L (ref 20–32)
CREATININE: 1.47 mg/dL — AB (ref 0.70–1.25)
Chloride: 102 mmol/L (ref 98–110)
GFR, EST AFRICAN AMERICAN: 59 mL/min/{1.73_m2} — AB (ref >=60)
GFR, EST NON AFRICAN AMERICAN: 51 mL/min/{1.73_m2} — AB (ref >=60)
GLUCOSE: 58 mg/dL — AB (ref 65–139)
Globulin: 2.7 g/dL (calc) (ref 1.9–3.7)
POTASSIUM: 3.8 mmol/L (ref 3.5–5.3)
Sodium: 140 mmol/L (ref 135–146)
TOTAL PROTEIN: 6.5 g/dL (ref 6.1–8.1)
Total Bilirubin: 0.3 mg/dL (ref 0.2–1.2)

## 2018-07-16 LAB — LIPID PANEL
CHOL/HDL RATIO: 4.6 (calc) (ref <5.0)
Cholesterol: 193 mg/dL (ref <200)
HDL: 42 mg/dL (ref >40)
LDL Cholesterol (Calc): 123 mg/dL (calc) — ABNORMAL HIGH
Non-HDL Cholesterol (Calc): 151 mg/dL (calc) — ABNORMAL HIGH (ref <130)
Triglycerides: 162 mg/dL — ABNORMAL HIGH (ref <150)

## 2018-07-20 ENCOUNTER — Encounter: Payer: Self-pay | Admitting: Urology

## 2018-07-20 ENCOUNTER — Other Ambulatory Visit
Admission: RE | Admit: 2018-07-20 | Discharge: 2018-07-20 | Disposition: A | Discharge status: Home / Self Care | Payer: Medicaid Other | Source: Ambulatory Visit | Attending: Family Medicine | Admitting: Family Medicine

## 2018-07-20 ENCOUNTER — Other Ambulatory Visit: Payer: Self-pay | Admitting: Family Medicine

## 2018-07-20 ENCOUNTER — Other Ambulatory Visit: Payer: Medicaid Other

## 2018-07-20 DIAGNOSIS — I2510 Atherosclerotic heart disease of native coronary artery without angina pectoris: Secondary | ICD-10-CM

## 2018-07-20 DIAGNOSIS — E782 Mixed hyperlipidemia: Secondary | ICD-10-CM

## 2018-07-20 DIAGNOSIS — R972 Elevated prostate specific antigen [PSA]: Secondary | ICD-10-CM | POA: Insufficient documentation

## 2018-07-20 LAB — PSA: PROSTATIC SPECIFIC ANTIGEN: 6.66 ng/mL — AB (ref 0.00–4.00)

## 2018-07-20 MED ORDER — ATORVASTATIN CALCIUM 80 MG PO TABS: 80.0000 mg | ORAL_TABLET | Freq: Every day | ORAL | 0 refills | Status: DC

## 2018-07-20 MED ORDER — EZETIMIBE 10 MG PO TABS: 10.0000 mg | ORAL_TABLET | Freq: Every day | ORAL | 3 refills | Status: DC

## 2018-07-20 NOTE — Progress Notes (Signed)
Increase statin and add zetia

## 2018-07-22 ENCOUNTER — Telehealth: Payer: Self-pay | Admitting: Family Medicine

## 2018-07-22 ENCOUNTER — Ambulatory Visit: Payer: Medicaid Other | Admitting: Urology

## 2018-07-22 ENCOUNTER — Encounter: Payer: Self-pay | Admitting: Urology

## 2018-07-22 NOTE — Telephone Encounter (Signed)
Copied from [place] [id number]. Topic: Referral - Status >> Jul 22, 2018 11:43 AM Cecelia Byars, NT wrote: Reason for CRM: Patient is calling to follow up a referral to a specialist  for his his arm and  shoulder please call him at [phone]

## 2018-07-29 ENCOUNTER — Telehealth: Payer: Self-pay | Admitting: Family Medicine

## 2018-07-29 NOTE — Telephone Encounter (Signed)
Patient here with his wife for her appointment He was peeing pure blood yesterday, with clots Nothing like that today Sending a note to his urologist He feels fine otherwise, no fevers; urinating okay today

## 2018-07-29 NOTE — Telephone Encounter (Signed)
Thanks for letting me know.  He was a No Show for his 6 month follow up last week.  We have reached out to him to reschedule.

## 2018-07-29 NOTE — Telephone Encounter (Signed)
Thank you :)

## 2018-08-01 ENCOUNTER — Encounter: Payer: Self-pay | Admitting: Urology

## 2018-08-01 NOTE — Progress Notes (Signed)
Certified letter sent on August 02, 2018.

## 2018-11-12 ENCOUNTER — Ambulatory Visit: Payer: Medicaid Other | Admitting: Family Medicine

## 2018-11-12 ENCOUNTER — Encounter: Payer: Self-pay | Admitting: Family Medicine

## 2018-11-12 VITALS — BP 116/70 | HR 83 | Temp 97.7°F | Ht 70.0 in | Wt 169.3 lb

## 2018-11-12 DIAGNOSIS — M75122 Complete rotator cuff tear or rupture of left shoulder, not specified as traumatic: Secondary | ICD-10-CM

## 2018-11-12 DIAGNOSIS — I700 Atherosclerosis of aorta: Secondary | ICD-10-CM

## 2018-11-12 DIAGNOSIS — I2510 Atherosclerotic heart disease of native coronary artery without angina pectoris: Secondary | ICD-10-CM

## 2018-11-12 DIAGNOSIS — E559 Vitamin D deficiency, unspecified: Secondary | ICD-10-CM

## 2018-11-12 DIAGNOSIS — M47812 Spondylosis without myelopathy or radiculopathy, cervical region: Secondary | ICD-10-CM | POA: Diagnosis not present

## 2018-11-12 DIAGNOSIS — E538 Deficiency of other specified B group vitamins: Secondary | ICD-10-CM

## 2018-11-12 DIAGNOSIS — E782 Mixed hyperlipidemia: Secondary | ICD-10-CM | POA: Diagnosis not present

## 2018-11-12 DIAGNOSIS — N183 Chronic kidney disease, stage 3 unspecified: Secondary | ICD-10-CM | POA: Insufficient documentation

## 2018-11-12 DIAGNOSIS — Z5181 Encounter for therapeutic drug level monitoring: Secondary | ICD-10-CM

## 2018-11-12 DIAGNOSIS — J439 Emphysema, unspecified: Secondary | ICD-10-CM

## 2018-11-12 DIAGNOSIS — R972 Elevated prostate specific antigen [PSA]: Secondary | ICD-10-CM

## 2018-11-12 NOTE — Assessment & Plan Note (Signed)
Managed/monitored by urologist

## 2018-11-12 NOTE — Assessment & Plan Note (Signed)
Refer back to ortho

## 2018-11-12 NOTE — Assessment & Plan Note (Signed)
Goal LDL less than 70; continue statin and aspirin

## 2018-11-12 NOTE — Progress Notes (Signed)
BP 116/70   Pulse 83   Temp 97.7 F (36.5 C) (Oral)   Ht 5\' 10"  (1.778 m)   Wt 169 lb 4.8 oz (76.8 kg)   SpO2 99%   BMI 24.29 kg/m    Subjective:    Patient ID: Manuel [name], male    DOB: 09-Jan-1956, 63 y.o.   MRN: [id number]  HPI: Manuel [name] is a 63 y.o. male  Chief Complaint  Patient presents with  . Follow-up    HPI Patient is here for f/u Moved into a bigger home recently; lots of light He is at peace, God gave them that house before Christmas; blessed with a brand new car through his job  He needs to get back to the orthopaedist for his left shoulder, Dr. Cindi Carbon  He did not get the office for his spine and wants to get back, [facility]  He saw a neurologist for multifactorial headache and they did a brain scan; July 2019; he was getting B12 injections at the neuro clinic too, last was July 2019  IMPRESSION: 1. No specific explanation for headache. 2. Small focus of calcification or remote hemorrhage in the left cerebral white matter from nonspecific remote insult. A tiny cavernoma could have this appearance.  Electronically Signed By: Monte Fantasia M.D. On: 05/29/2018 20:01  Elevated PSA, managed by urologist, last PSA over 6 (Sept 2019); has another lab in January and sees her again the next week  High cholesterol; high LDL in Sept; eats fried foods every now and then; more [employer] salad; not much meat; not many eggs; might have a breakfast every now and then; medicines adjusted in September, atorvastatin went from 40 to 80 mg and added zetia; doing okay with the higher dose; I asked about missed doses, and he is not missing doses he says  CKD stage 3, not drinking enough water he says; needs to though he says; taking only tylenol, no NSAIDs  Low vitamin D; taking supplement  Aortic atherosclerosis and CAD on scanning; no sx; on statin; saw Dr. Fletcher Anon in 2017  He continues to smoke, 1/2 ppd; he slowed down a whole lot; emphysema; not  ready to quit right now  Depression screen [facility] 2/9 11/12/2018 07/15/2018 04/12/2018 12/10/2017 08/24/2017  Decreased Interest 0 0 0 0 0  Down, Depressed, Hopeless 0 2 0 0 0  PHQ - 2 Score 0 2 0 0 0  Altered sleeping 0 3 0 - -  Tired, decreased energy 0 2 3 - -  Change in appetite 0 1 0 - -  Feeling bad or failure about yourself  0 0 0 - -  Trouble concentrating 0 1 0 - -  Moving slowly or fidgety/restless 0 0 0 - -  Suicidal thoughts 0 0 0 - -  PHQ-9 Score 0 9 3 - -  Difficult doing work/chores Not difficult at all Not difficult at all Not difficult at all - -   Fall Risk  11/12/2018 07/15/2018 04/12/2018 12/10/2017 08/24/2017  Falls in the past year? 0 No No No No  Number falls in past yr: 0 - - - -  Injury with Fall? 0 - - - -    Relevant past medical, surgical, family and social history reviewed Past Medical History:  Diagnosis Date  . Anemia   . Anxiety   . Aortic atherosclerosis ([place]) 01/11/2018   CT scan Feb 2019  . Asthma   . Bilateral carpal tunnel syndrome   . Bilateral  hand numbness   . BPH without urinary obstruction   . Cervical spondylosis without myelopathy   . Complete tear of rotator cuff    bilateral  . COPD (chronic obstructive pulmonary disease) ([place])   . Depression   . Emphysema lung ([place]) 01/11/2018   Chest CT Feb 2019  . History of stomach ulcers   . HLD (hyperlipidemia)   . LVH (left ventricular hypertrophy)   . Personal history of tobacco use, presenting hazards to health 12/21/2015  . Poor dentition   . Sickle cell trait ([place])   . Sleep apnea   . Smoker    Past Surgical History:  Procedure Laterality Date  . COLONOSCOPY WITH PROPOFOL N/A 12/25/2015   Procedure: COLONOSCOPY WITH PROPOFOL;  Surgeon: Lucilla Lame, MD;  Location: [facility] ENDOSCOPY;  Service: Endoscopy;  Laterality: N/A;  . CYSTOURETHROSCOPY  08/25/12   with ureteral cathization w/wo retrograde pyelogram  . ROTATOR CUFF REPAIR Right 01/25/15  . SHOULDER ARTHROSCOPY WITH SUBACROMIAL  DECOMPRESSION Right 032416  . TRANSURETHRAL RESECTION OF PROSTATE  08/25/12   Family History  Problem Relation Age of Onset  . Aneurysm Mother   . Hypertension Mother   . Diabetes Father   . Hypertension Father   . Prostate cancer Father   . Lupus Father   . Cancer Maternal Aunt   . Cancer Maternal Uncle   . Diabetes Paternal Uncle   . Heart disease Cousin   . Prostate cancer Paternal Uncle   . COPD Neg Hx   . Stroke Neg Hx   . Kidney disease Neg Hx    Social History   Tobacco Use  . Smoking status: Current Every Day Smoker    Packs/day: 0.50    Years: 35.00    Pack years: 17.50    Types: Cigarettes  . Smokeless tobacco: Never Used  Substance Use Topics  . Alcohol use: No  . 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 Allergies and medications reviewed  Review of Systems Per HPI unless specifically indicated above     Objective:    BP 116/70   Pulse 83   Temp 97.7 F (36.5 C) (Oral)   Ht 5\' 10"  (1.778 m)   Wt 169 lb 4.8 oz (76.8 kg)   SpO2 99%   BMI 24.29 kg/m   Wt Readings from Last 3 Encounters:  11/12/18 169 lb 4.8 oz (76.8 kg)  07/15/18 167 lb 1.6 oz (75.8 kg)  04/20/18 164 lb 9.6 oz (74.7 kg)    Physical Exam Constitutional:      General: He is not in acute distress.    Appearance: He is well-developed and normal weight.  HENT:     Head: Normocephalic and atraumatic.  Eyes:     General: No scleral icterus. Neck:     Thyroid: No thyromegaly.  Cardiovascular:     Rate and Rhythm: Normal rate and regular rhythm.  Pulmonary:     Effort: Pulmonary effort is normal.     Breath sounds: Normal breath sounds.  Abdominal:     General: Bowel sounds are normal. There is no distension.     Palpations: Abdomen is soft.  Skin:    General: Skin is warm and dry.     Coloration: Skin is not pale.    Neurological:  Mental Status: He is alert.     Coordination: Coordination normal.  Psychiatric:        Mood and Affect: Mood is not anxious or depressed.        Behavior: Behavior normal.        Thought Content: Thought content normal.        Judgment: Judgment normal.     Results for orders placed or performed during the hospital encounter of 07/20/18  PSA  Result Value Ref Range   Prostatic Specific Antigen 6.66 (H) 0.00 - 4.00 ng/mL      Assessment & Plan:   Problem List Items Addressed This Visit      Cardiovascular and Mediastinum   Coronary artery disease (Chronic)    Goal LDL less than 70; continue statin and aspirin      Aortic atherosclerosis (HCC) (Chronic)    Goal LDL less than 70; continue statin      Relevant Orders   Lipid panel     Respiratory   Emphysema lung (HCC) (Chronic)    encouraged smoking cessation        Musculoskeletal and Integument   Complete rotator cuff rupture of left shoulder    Refer back to ortho      Relevant Orders   Ambulatory referral to Orthopedic Surgery   Cervical spondylosis without myelopathy    Refer back to neurosurgery      Relevant Orders   Ambulatory referral to Neurosurgery     Genitourinary   Chronic kidney disease, stage III (moderate) (HCC)    Check GFR and Cr; avoid NSAIDs      Relevant Orders   Lipid panel     Other   Medication monitoring encounter - Primary   Relevant Orders   COMPLETE METABOLIC PANEL WITH GFR   Vitamin D deficiency    Check level today and supplement if needed      Relevant Orders   VITAMIN D 25 Hydroxy (Vit-D Deficiency, Fractures)   Vitamin B12 deficiency    Was getting injectin at Dr. Trena Platt office      Hyperlipidemia (Chronic)    Limiting saturated fats; check lipids today; continue statin plus zetia      Relevant Orders   Lipid panel   Elevated PSA    Managed/monitored by urologist          Follow up plan: No follow-ups on file.  An after-visit  summary was printed and given to the patient at [place].  Please see the patient instructions which may contain other information and recommendations beyond what is mentioned above in the assessment and plan.  No orders of the defined types were placed in this encounter.   Orders Placed This Encounter  Procedures  . VITAMIN D 25 Hydroxy (Vit-D Deficiency, Fractures)  . COMPLETE METABOLIC PANEL WITH GFR  . Lipid panel  . Ambulatory referral to Orthopedic Surgery  . Ambulatory referral to Neurosurgery

## 2018-11-12 NOTE — Assessment & Plan Note (Signed)
Refer back to neurosurgery

## 2018-11-12 NOTE — Assessment & Plan Note (Signed)
encouraged smoking cessation

## 2018-11-12 NOTE — Assessment & Plan Note (Signed)
Limiting saturated fats; check lipids today; continue statin plus zetia

## 2018-11-12 NOTE — Assessment & Plan Note (Signed)
Check level today and supplement if needed 

## 2018-11-12 NOTE — Assessment & Plan Note (Signed)
Check GFR and Cr; avoid NSAIDs

## 2018-11-12 NOTE — Assessment & Plan Note (Signed)
Was getting injectin at Dr. Trena Platt office

## 2018-11-12 NOTE — Assessment & Plan Note (Signed)
Goal LDL less than 70; continue statin

## 2018-11-12 NOTE — Patient Instructions (Addendum)
Please call Dr. Manuella Ghazi to schedule a follow-up appointment. They wanted to see you back around October 17, 2018. Phone: [phone]  We'll have you get back in with the orthopaedist and the neurosurgeon.  I do encourage you to quit smoking Call [phone] to sign up for smoking cessation classes You can call 1-800-QUIT-NOW to talk with a smoking cessation coach   Health Risks of Smoking Smoking cigarettes is very bad for your health. Tobacco smoke has over 200 known poisons in it. It contains the poisonous gases nitrogen oxide and carbon monoxide. There are over 60 chemicals in tobacco smoke that cause cancer. Smoking is difficult to quit because a chemical in tobacco, called nicotine, causes addiction or dependence. When you smoke and inhale, nicotine is absorbed rapidly into the bloodstream through your lungs. Both inhaled and non-inhaled nicotine may be addictive. What are the risks of cigarette smoke? Cigarette smokers have an increased risk of many serious medical problems, including:  Lung cancer.  Lung disease, such as pneumonia, bronchitis, and emphysema.  Chest pain (angina) and heart attack because the heart is not getting enough oxygen.  Heart disease and peripheral blood vessel disease.  High blood pressure (hypertension).  Stroke.  Oral cancer, including cancer of the lip, mouth, or voice box.  Bladder cancer.  Pancreatic cancer.  Cervical cancer.  Pregnancy complications, including premature birth.  Stillbirths and smaller newborn babies, birth defects, and genetic damage to sperm.  Early menopause.  Lower estrogen level for women.  Infertility.  Facial wrinkles.  Blindness.  Increased risk of broken bones (fractures).  Senile dementia.  Stomach ulcers and internal bleeding.  Delayed wound healing and increased risk of complications during surgery.  Even smoking lightly shortens your life expectancy by several years. Because of secondhand  smoke exposure, children of smokers have an increased risk of the following:  Sudden infant death syndrome (SIDS).  Respiratory infections.  Lung cancer.  Heart disease.  Ear infections. What are the benefits of quitting? There are many health benefits of quitting smoking. Here are some of them:  Within days of quitting smoking, your risk of having a heart attack decreases, your blood flow improves, and your lung capacity improves. Blood pressure, pulse rate, and breathing patterns start returning to normal soon after quitting.  Within months, your lungs may clear up completely.  Quitting for 10 years reduces your risk of developing lung cancer and heart disease to almost that of a nonsmoker.  People who quit may see an improvement in their overall quality of life. How do I quit smoking?     Smoking is an addiction with both physical and psychological effects, and longtime habits can be hard to change. Your health care provider can recommend:  Programs and community resources, which may include group support, education, or talk therapy.  Prescription medicines to help reduce cravings.  Nicotine replacement products, such as patches, gum, and nasal sprays. Use these products only as directed. Do not replace cigarette smoking with electronic cigarettes, which are commonly called e-cigarettes. The safety of e-cigarettes is not known, and some may contain harmful chemicals.  A combination of two or more of these methods. Where to find more information  American Lung Association: www.lung.org  American Cancer Society: www.cancer.org Summary  Smoking cigarettes is very bad for your health. Cigarette smokers have an increased risk of many serious medical problems, including several cancers, heart disease, and stroke.  Smoking is an addiction with both physical and psychological effects, and longtime habits can be  hard to change.  By stopping right away, you can greatly reduce  the risk of medical problems for you and your family.  To help you quit smoking, your health care provider can recommend programs, community resources, prescription medicines, and nicotine replacement products such as patches, gum, and nasal sprays. This information is not intended to replace advice given to you by your health care provider. Make sure you discuss any questions you have with your health care provider. Document Released: 11/27/2004 Document Revised: 01/21/2018 Document Reviewed: 10/24/2016 Elsevier Interactive Patient Education  2019 [employer].  Steps to Quit Smoking  Smoking tobacco can be bad for your health. It can also affect almost every organ in your body. Smoking puts you and people around you at risk for many serious long-lasting (chronic) diseases. Quitting smoking is hard, but it is one of the best things that you can do for your health. It is never too late to quit. What are the benefits of quitting smoking? When you quit smoking, you lower your risk for getting serious diseases and conditions. They can include:  Lung cancer or lung disease.  Heart disease.  Stroke.  Heart attack.  Not being able to have children (infertility).  Weak bones (osteoporosis) and broken bones (fractures). If you have coughing, wheezing, and shortness of breath, those symptoms may get better when you quit. You may also get sick less often. If you are pregnant, quitting smoking can help to lower your chances of having a baby of low birth weight. What can I do to help me quit smoking? Talk with your doctor about what can help you quit smoking. Some things you can do (strategies) include:  Quitting smoking totally, instead of slowly cutting back how much you smoke over a period of time.  Going to in-person counseling. You are more likely to quit if you go to many counseling sessions.  Using resources and support systems, such as: ? Database administrator with a Social worker. ? Phone  quitlines. ? Careers information officer. ? Support groups or group counseling. ? Text messaging programs. ? Mobile phone apps or applications.  Taking medicines. Some of these medicines may have nicotine in them. If you are pregnant or breastfeeding, do not take any medicines to quit smoking unless your doctor says it is okay. Talk with your doctor about counseling or other things that can help you. Talk with your doctor about using more than one strategy at the same time, such as taking medicines while you are also going to in-person counseling. This can help make quitting easier. What things can I do to make it easier to quit? Quitting smoking might feel very hard at first, but there is a lot that you can do to make it easier. Take these steps:  Talk to your family and friends. Ask them to support and encourage you.  Call phone quitlines, reach out to support groups, or work with a Social worker.  Ask people who smoke to not smoke around you.  Avoid places that make you want (trigger) to smoke, such as: ? Bars. ? Parties. ? Smoke-break areas at work.  Spend time with people who do not smoke.  Lower the stress in your life. Stress can make you want to smoke. Try these things to help your stress: ? Getting regular exercise. ? Deep-breathing exercises. ? Yoga. ? Meditating. ? Doing a body scan. To do this, close your eyes, focus on one area of your body at a time from head to toe, and  notice which parts of your body are tense. Try to relax the muscles in those areas.  Download or buy apps on your mobile phone or tablet that can help you stick to your quit plan. There are many free apps, such as QuitGuide from the [employer] Office manager for Disease Control and Prevention). You can find more support from smokefree.gov and other websites. This information is not intended to replace advice given to you by your health care provider. Make sure you discuss any questions you have with your health care  provider. Document Released: 08/16/2009 Document Revised: 06/17/2016 Document Reviewed: 03/06/2015 Elsevier Interactive Patient Education  2019 [employer].

## 2018-11-13 LAB — COMPLETE METABOLIC PANEL WITH GFR
AG Ratio: 1.3 (calc) (ref 1.0–2.5)
ALBUMIN MSPROF: 3.8 g/dL (ref 3.6–5.1)
ALKALINE PHOSPHATASE (APISO): 76 U/L (ref 40–115)
ALT: 11 U/L (ref 9–46)
AST: 14 U/L (ref 10–35)
BILIRUBIN TOTAL: 0.5 mg/dL (ref 0.2–1.2)
BUN / CREAT RATIO: 10 (calc) (ref 6–22)
BUN: 15 mg/dL (ref 7–25)
CHLORIDE: 102 mmol/L (ref 98–110)
CO2: 29 mmol/L (ref 20–32)
CREATININE: 1.51 mg/dL — AB (ref 0.70–1.25)
Calcium: 9.3 mg/dL (ref 8.6–10.3)
GFR, Est African American: 57 mL/min/{1.73_m2} — ABNORMAL LOW (ref >=60)
GFR, Est Non African American: 49 mL/min/{1.73_m2} — ABNORMAL LOW (ref >=60)
GLUCOSE: 85 mg/dL (ref 65–99)
Globulin: 2.9 g/dL (calc) (ref 1.9–3.7)
Potassium: 4.2 mmol/L (ref 3.5–5.3)
Sodium: 139 mmol/L (ref 135–146)
Total Protein: 6.7 g/dL (ref 6.1–8.1)

## 2018-11-13 LAB — LIPID PANEL
Cholesterol: 228 mg/dL — ABNORMAL HIGH (ref <200)
HDL: 48 mg/dL (ref >40)
LDL Cholesterol (Calc): 149 mg/dL (calc) — ABNORMAL HIGH
Non-HDL Cholesterol (Calc): 180 mg/dL (calc) — ABNORMAL HIGH (ref <130)
Total CHOL/HDL Ratio: 4.8 (calc) (ref <5.0)
Triglycerides: 174 mg/dL — ABNORMAL HIGH (ref <150)

## 2018-11-13 LAB — VITAMIN D 25 HYDROXY (VIT D DEFICIENCY, FRACTURES): VIT D 25 HYDROXY: 15 ng/mL — AB (ref 30–100)

## 2018-11-15 ENCOUNTER — Other Ambulatory Visit: Payer: Self-pay | Admitting: Family Medicine

## 2018-11-15 MED ORDER — ATORVASTATIN CALCIUM 80 MG PO TABS: 80.0000 mg | ORAL_TABLET | Freq: Every day | ORAL | 0 refills | Status: DC

## 2018-11-15 MED ORDER — VITAMIN D (ERGOCALCIFEROL) 1.25 MG (50000 UNIT) PO CAPS: 50000.0000 [IU] | ORAL_CAPSULE | ORAL | 1 refills | Status: DC

## 2018-11-15 NOTE — Progress Notes (Signed)
Start Rx vit D weekly x 8 weeks, then once a month for 3 months

## 2018-11-16 ENCOUNTER — Telehealth: Payer: Self-pay

## 2018-11-16 DIAGNOSIS — E782 Mixed hyperlipidemia: Secondary | ICD-10-CM

## 2018-11-16 DIAGNOSIS — I700 Atherosclerosis of aorta: Secondary | ICD-10-CM

## 2018-11-16 NOTE — Telephone Encounter (Signed)
-----   Message from Arnetha Courser, MD sent at 11/15/2018  5:56 PM EST ----- Manuel [name], please let the patient know that his vitamin D is quite low. Start Rx vitamin D once a week for 8 weeks. When he finishes that, call us and we'll do once a month for 3 months. Send copy of kidney function (Cr and GFR) to his kidney doctor. Kidney function reduced overall, but stable. Cholesterol is too high. I'm going to guess that he ran out of his cholesterol medicine (atorvastatin) because it was last prescribed in Sept and should have run out in Dec. I want him to take 80 mg of atorvastatin daily PLUS 10 mg of Zetia (ezetimibe) daily and really watch his diet. High LDL can form plaques and lead to heart attacks and strokes. Recheck lipids in 6 weeks, (please ORDER)

## 2018-11-22 ENCOUNTER — Other Ambulatory Visit: Payer: Self-pay | Admitting: Family Medicine

## 2018-11-22 DIAGNOSIS — R972 Elevated prostate specific antigen [PSA]: Secondary | ICD-10-CM

## 2018-11-24 ENCOUNTER — Other Ambulatory Visit: Payer: Medicaid Other

## 2018-11-27 ENCOUNTER — Telehealth: Payer: Self-pay

## 2018-11-27 NOTE — Telephone Encounter (Signed)
Call pt regarding lung screening. Left message for pt to return call.  

## 2018-11-29 ENCOUNTER — Other Ambulatory Visit: Payer: Medicaid Other

## 2018-11-29 ENCOUNTER — Encounter: Payer: Self-pay | Admitting: Urology

## 2018-11-30 ENCOUNTER — Other Ambulatory Visit: Payer: Medicaid Other

## 2018-11-30 DIAGNOSIS — R972 Elevated prostate specific antigen [PSA]: Secondary | ICD-10-CM

## 2018-12-01 ENCOUNTER — Ambulatory Visit (INDEPENDENT_AMBULATORY_CARE_PROVIDER_SITE_OTHER): Payer: Medicaid Other | Admitting: Urology

## 2018-12-01 ENCOUNTER — Encounter: Payer: Self-pay | Admitting: Urology

## 2018-12-01 VITALS — BP 156/91 | HR 71 | Ht 70.0 in | Wt 168.0 lb

## 2018-12-01 DIAGNOSIS — N138 Other obstructive and reflux uropathy: Secondary | ICD-10-CM

## 2018-12-01 DIAGNOSIS — R3989 Other symptoms and signs involving the genitourinary system: Secondary | ICD-10-CM

## 2018-12-01 DIAGNOSIS — N529 Male erectile dysfunction, unspecified: Secondary | ICD-10-CM

## 2018-12-01 DIAGNOSIS — N401 Enlarged prostate with lower urinary tract symptoms: Secondary | ICD-10-CM | POA: Diagnosis not present

## 2018-12-01 DIAGNOSIS — R972 Elevated prostate specific antigen [PSA]: Secondary | ICD-10-CM

## 2018-12-01 DIAGNOSIS — Z87448 Personal history of other diseases of urinary system: Secondary | ICD-10-CM

## 2018-12-01 LAB — URINALYSIS, COMPLETE
Bilirubin, UA: NEGATIVE
Glucose, UA: NEGATIVE
Ketones, UA: NEGATIVE
LEUKOCYTES UA: NEGATIVE
NITRITE UA: NEGATIVE
Protein, UA: NEGATIVE
RBC, UA: NEGATIVE
Specific Gravity, UA: 1.01 (ref 1.005–1.030)
Urobilinogen, Ur: 0.2 mg/dL (ref 0.2–1.0)
pH, UA: 6.5 (ref 5.0–7.5)

## 2018-12-01 LAB — BLADDER SCAN AMB NON-IMAGING

## 2018-12-01 LAB — PSA: Prostate Specific Ag, Serum: 5.3 ng/mL — ABNORMAL HIGH (ref 0.0–4.0)

## 2018-12-01 MED ORDER — TAMSULOSIN HCL 0.4 MG PO CAPS: 0.4000 mg | ORAL_CAPSULE | Freq: Every day | ORAL | 3 refills | Status: DC

## 2018-12-01 MED ORDER — FINASTERIDE 5 MG PO TABS: 5.0000 mg | ORAL_TABLET | Freq: Every day | ORAL | 3 refills | Status: DC

## 2018-12-01 MED ORDER — SILDENAFIL CITRATE 20 MG PO TABS: ORAL_TABLET | ORAL | 3 refills | Status: DC

## 2018-12-01 NOTE — Progress Notes (Signed)
12/01/2018  3:06 PM   [name] [id number] [id number]  Referring provider: Arnetha Courser, MD [address] [place], [place] [address]  Chief Complaint  Patient presents with  . Elevated PSA    HPI: Jenna Ardoin is a 63 y.o. Black or [place] American male with a history of ED, gross hematuria, elevated PSA, BPH with LUTS that presents today for a 66-month follow up of the above.   ED His SHIM score today is 17 which is mild ED, it has improved from his previous 13 (mild/moderate ED) in 04/2018. He denies any lumps, bumps or erectile curvature.   He is currently on sildenafil for his symptoms (started 04/2018). He finds it helpful with his ED symptoms, but admits he has run out since our last appointment.  SHIM    Row Name 12/01/18 1354         SHIM: Over the last 6 months:   How do you rate your confidence that you could get and keep an erection?  Moderate     When you had erections with sexual stimulation, how often were your erections hard enough for penetration (entering your partner)?  Sometimes (about half the time)     During sexual intercourse, how often were you able to maintain your erection after you had penetrated (entered) your partner?  Sometimes (about half the time)     During sexual intercourse, how difficult was it to maintain your erection to completion of intercourse?  Slightly Difficult     When you attempted sexual intercourse, how often was it satisfactory for you?  Most Times (much more than half the time)       SHIM Total Score   SHIM  17       BPH with LUTS Last PSA was 5.3 in 11/2018 His I-PSS is 7, which is mild lower urinary tract symptamatology. It has improved from his previous 8 (moderate) on 04/2018. He reports that he is currently mixed about his quality of life given his urinary symptoms. His outlook has improved since his last visit, at that time he was mostly dissatisfied.   He reports a weak stream. He does not want to  undergo a cystoscopy at this time, but would like to find a way to improve his stream. He reports that he has been taking Omega XL for about a week for his back pain.  He is still taking finasteride and tamsulosin.   IPSS    Row Name 12/01/18 1300         International Prostate Symptom Score   How often have you had to urinate less than every two hours?  Less than half the time     How often have you found you stopped and started again several times when you urinated?  Less than 1 in 5 times     How often have you found it difficult to postpone urination?  Not at All     How often have you had a weak urinary stream?  Less than half the time     How often have you had to strain to start urination?  Less than 1 in 5 times     How many times did you typically get up at night to urinate?  1 Time     Total IPSS Score  7       Quality of Life due to urinary symptoms   If you were to spend the rest of your life with  your urinary condition just the way it is now how would you feel about that?  Mixed       Elevated PSA Benign prostate biopsy in 10/2016 His PSA is 5.3 (11/2018) which is slightly better since 07/2018 when it was 6.66. He is compliant with tamsulosin and finasteride.   Patient is agreeable to prostate MRI at this time.  History of gross hematuria He recently experienced an episode of gross hematuria. He reports that this occurred after he took an extra dose of his heart medication by accident. He has not experienced gross hematuria since.   UA today was unremarkable  PMH: Past Medical History:  Diagnosis Date  . Anemia   . Anxiety   . Aortic atherosclerosis ([place]) 01/11/2018   CT scan Feb 2019  . Asthma   . Bilateral carpal tunnel syndrome   . Bilateral hand numbness   . BPH without urinary obstruction   . Cervical spondylosis without myelopathy   . Complete tear of rotator cuff    bilateral  . COPD (chronic obstructive pulmonary disease) ([place])   . Depression   .  Emphysema lung ([place]) 01/11/2018   Chest CT Feb 2019  . History of stomach ulcers   . HLD (hyperlipidemia)   . LVH (left ventricular hypertrophy)   . Personal history of tobacco use, presenting hazards to health 12/21/2015  . Poor dentition   . Sickle cell trait ([place])   . Sleep apnea   . Smoker     Surgical History: Past Surgical History:  Procedure Laterality Date  . COLONOSCOPY WITH PROPOFOL N/A 12/25/2015   Procedure: COLONOSCOPY WITH PROPOFOL;  Surgeon: Lucilla Lame, MD;  Location: [facility] ENDOSCOPY;  Service: Endoscopy;  Laterality: N/A;  . CYSTOURETHROSCOPY  08/25/12   with ureteral cathization w/wo retrograde pyelogram  . ROTATOR CUFF REPAIR Right 01/25/15  . SHOULDER ARTHROSCOPY WITH SUBACROMIAL DECOMPRESSION Right [id number]  . TRANSURETHRAL RESECTION OF PROSTATE  08/25/12    Home Medications:  Allergies as of 12/01/2018      Reactions   Penicillin G Other (See Comments)      Medication List       Accurate as of December 01, 2018  3:06 PM. Always use your most recent med list.        aspirin EC 81 MG tablet Take 1 tablet (81 mg total) by mouth daily.   atorvastatin 80 MG tablet Commonly known as:  LIPITOR Take 1 tablet (80 mg total) by mouth at bedtime. For cholesterol   buPROPion 150 MG 24 hr tablet Commonly known as:  WELLBUTRIN XL Take 1 tablet (150 mg total) by mouth daily.   ezetimibe 10 MG tablet Commonly known as:  ZETIA Take 1 tablet (10 mg total) by mouth daily.   finasteride 5 MG tablet Commonly known as:  PROSCAR Take 1 tablet (5 mg total) by mouth daily.   sertraline 50 MG tablet Commonly known as:  ZOLOFT Take 1 tablet (50 mg total) by mouth daily.   sildenafil 20 MG tablet Commonly known as:  REVATIO Take 3 to 5 tablets two hours before intercouse on an empty stomach.  Do not take with nitrates.   tamsulosin 0.4 MG Caps capsule Commonly known as:  FLOMAX Take 1 capsule (0.4 mg total) by mouth daily.   vitamin B-12 500 MCG tablet Commonly  known as:  CYANOCOBALAMIN Take 1 tablet (500 mcg total) by mouth daily.   Vitamin D (Ergocalciferol) 1.25 MG (50000 UT) Caps capsule Commonly known as:  DRISDOL Take  1 capsule (50,000 Units total) by mouth every 7 (seven) days.       Allergies:  Allergies  Allergen Reactions  . Penicillin G Other (See Comments)    Family History: Family History  Problem Relation Age of Onset  . Aneurysm Mother   . Hypertension Mother   . Diabetes Father   . Hypertension Father   . Prostate cancer Father   . Lupus Father   . Cancer Maternal Aunt   . Cancer Maternal Uncle   . Diabetes Paternal Uncle   . Heart disease Cousin   . Prostate cancer Paternal Uncle   . COPD Neg Hx   . Stroke Neg Hx   . Kidney disease Neg Hx     Social History:  reports that he has been smoking cigarettes. He has a 17.50 pack-year smoking history. He has never used smokeless tobacco. He reports that he does not drink alcohol or use drugs.  ROS: UROLOGY Frequent Urination?: No Hard to postpone urination?: No Burning/pain with urination?: No Get up at night to urinate?: No Leakage of urine?: No Urine stream starts and stops?: No Trouble starting stream?: No Do you have to strain to urinate?: No Blood in urine?: No Urinary tract infection?: No Sexually transmitted disease?: No Injury to kidneys or bladder?: No Painful intercourse?: No Weak stream?: No Erection problems?: No Penile pain?: No  Gastrointestinal Nausea?: No Vomiting?: No Indigestion/heartburn?: No Diarrhea?: No Constipation?: No  Constitutional Fever: No Night sweats?: No Weight loss?: No Fatigue?: No  Skin Skin rash/lesions?: No Itching?: No  Eyes Blurred vision?: No Double vision?: No  Ears/Nose/Throat Sore throat?: No Sinus problems?: No  Hematologic/Lymphatic Swollen glands?: No Easy bruising?: No  Cardiovascular Leg swelling?: Yes Chest pain?: No  Respiratory Cough?: Yes Shortness of breath?:  No  Endocrine Excessive thirst?: No  Musculoskeletal Back pain?: Yes Joint pain?: Yes  Neurological Headaches?: Yes Dizziness?: No  Psychologic Depression?: No Anxiety?: No  Physical Exam: BP (!) 156/91 (BP Location: Left Arm, Patient Position: Sitting)   Pulse 71   Ht 5\' 10"  (1.778 m)   Wt 168 lb (76.2 kg)   BMI 24.11 kg/m   Constitutional:  Alert and oriented, No acute distress. Respiratory: Normal respiratory effort, no increased work of breathing. Head: Normocephalic and Atraumatic. GU: No CVA tenderness.  No bladder fullness or masses.  Patient with circumcised phallus. Urethral meatus is patent.  No penile discharge. No penile lesions or rashes. Scrotum without lesions, cysts, rashes and/or edema.  Testicles are located scrotally bilaterally. No masses are appreciated in the testicles. Left and right epididymis are normal. Rectal: Patient with  normal sphincter tone. Anus and perineum without scarring or rashes. No rectal masses are appreciated. Prostate with a nodule about 5-mm x 3-mm on the left lobe.  Prostate is approximately 55 grams.  Skin: No rashes, bruises or suspicious lesions. Neurologic: Grossly intact, no focal deficits, moving all 4 extremities. Psychiatric: Normal mood and affect.  Laboratory Data: Lab Results  Component Value Date   CREATININE 1.51 (H) 11/12/2018   Urinalysis Microscopic Exam  Ref Range and Units  WBC None 0-5/hpf  RBC None 0-2/hpf  Epithelial Cells None 0-10/hpf (non-renal)  Renal Epithelial Cells None /hpf  Casts None None seen/lpf  Cast Type None   Crystals None None seen/lpf  Crystal Type None   Mucus Threads None Not established/lp  Bacteria None None to few seen  Yeast None None seen  Trichomonas None None seen  Nitrate Negative Negative   Pertinent  Imaging: Results for orders placed or performed in visit on 12/01/18  BLADDER SCAN AMB NON-IMAGING  Result Value Ref Range   Scan Result 124ml    Assessment & Plan:    1. Elevated PSA/abnormal exam  - PSA remains elevated. Last PSA was 5.6 in 11/2018  - We discussed that indications for prostate biopsy are defined by age and race specific PSA cutoffs as well as a PSA velocity of 0.75/year.  - Abnormal DRE with nodule at left apex. We reviewed the implications of an elevated PSA and the uncertainty surrounding it. In general, a man's PSA increases with age and is produced by both normal and cancerous prostate tissue.      - Discussed prostate MRI to rule out any abnormalities/malignancies. Patient is agreeable to this plan  - Patient will return pending his prostate MRI to discuss his results  2. ED  - His SHIM today is 17 (mild ED), it has improved (prev. 13, mild/moderate ED in 04/2018)  - Continue sildenafil, refill sent to pharmacy  - Return in 6 months for SHIM and exam  3. BPH with LUTS  - I-PSS is 7 (mild), it has improved from his prior 8 (moderate, 04/2018).  - He has an improved outlook on his quality of life given his urinary symptoms (currently mixed v. Previously mostly dissatisfied)  - His most bothersome symptom at this time is his weak stream. Although, he is not interested in pursuing a cystoscopy at this time.  - He was advised not to double up on his medication in an attempt to alleviate his symptoms. His additional questions and concerns were addressed.  - Return in 6 months for IPSS, PVR, PSA and exam  Return for pending prostate MRI then return in 6 months for IPSS, SHIM, PVR, PSA and exam.  Zara Council, PA-C [facility] [address], [place], [place] [address] [phone]  I, Temidayo Atanda-Ogunleye , am acting as a scribe for [facility], PA-C  I have reviewed the above documentation for accuracy and completeness, and I agree with the above.    Zara Council, PA-C

## 2018-12-03 LAB — CULTURE, URINE COMPREHENSIVE

## 2018-12-07 ENCOUNTER — Other Ambulatory Visit: Payer: Self-pay | Admitting: Family Medicine

## 2018-12-13 ENCOUNTER — Telehealth: Payer: Self-pay | Admitting: *Deleted

## 2018-12-13 DIAGNOSIS — Z87891 Personal history of nicotine dependence: Secondary | ICD-10-CM

## 2018-12-13 DIAGNOSIS — Z122 Encounter for screening for malignant neoplasm of respiratory organs: Secondary | ICD-10-CM

## 2018-12-13 NOTE — Telephone Encounter (Signed)
Patient has been notified that annual lung cancer screening low dose CT scan is due currently or will be in near future. Confirmed that patient is within the age range of 55-77, and asymptomatic, (no signs or symptoms of lung cancer). Patient denies illness that would prevent curative treatment for lung cancer if found. Verified smoking history, (current, 54 pack year). The shared decision making visit was done 12/21/15. Patient is agreeable for CT scan being scheduled.

## 2018-12-21 ENCOUNTER — Telehealth: Payer: Self-pay | Admitting: Urology

## 2018-12-21 NOTE — Telephone Encounter (Signed)
Patient came into the office asking what his next steps are going to be since his insurance denied his prostate MRI?  Please advise He wants you to call him to discuss or does  He need an OV?   Sharyn Lull

## 2018-12-21 NOTE — Telephone Encounter (Signed)
He should come in for an office visit so that we can discuss further options.

## 2018-12-21 NOTE — Telephone Encounter (Signed)
He will be here on 12-23-18   [facility]

## 2018-12-23 ENCOUNTER — Encounter: Payer: Self-pay | Admitting: Urology

## 2018-12-23 ENCOUNTER — Ambulatory Visit (INDEPENDENT_AMBULATORY_CARE_PROVIDER_SITE_OTHER): Payer: Medicaid Other | Admitting: Urology

## 2018-12-23 ENCOUNTER — Ambulatory Visit
Admission: RE | Admit: 2018-12-23 | Discharge: 2018-12-23 | Disposition: A | Discharge status: Home / Self Care | Payer: Medicaid Other | Source: Ambulatory Visit | Attending: Oncology | Admitting: Oncology

## 2018-12-23 VITALS — Ht 70.0 in

## 2018-12-23 DIAGNOSIS — Z87891 Personal history of nicotine dependence: Secondary | ICD-10-CM | POA: Diagnosis present

## 2018-12-23 DIAGNOSIS — Z122 Encounter for screening for malignant neoplasm of respiratory organs: Secondary | ICD-10-CM | POA: Insufficient documentation

## 2018-12-23 DIAGNOSIS — R972 Elevated prostate specific antigen [PSA]: Secondary | ICD-10-CM

## 2018-12-23 NOTE — Progress Notes (Signed)
12/23/2018  3:02 PM   Manuel [name] 06-27-1956 [id number]  Referring provider: Arnetha Courser, MD [address] [place], [place] [address]  Chief Complaint  Patient presents with  . Follow-up    HPI: Manuel [name] is a 63 y.o. Black or [place] American male with an elevated PSA whose MRI of his prostate was denied is hear to discuss his next options.  Elevated PSA Benign prostate biopsy in 10/2016 His PSA is 5.3 (11/2018) which is slightly better since 07/2018 when it was 6.66. He is compliant with tamsulosin and finasteride.  Patient is agreeable to prostate MRI at this time, but insurance is requiring a previous biopsy prior to approval.  Reviewing his history, he did undergo a TURP in 2013 where prostate chips were sent for pathology and returned negative.  PMH: Past Medical History:  Diagnosis Date  . Anemia   . Anxiety   . Aortic atherosclerosis ([place]) 01/11/2018   CT scan Feb 2019  . Asthma   . Bilateral carpal tunnel syndrome   . Bilateral hand numbness   . BPH without urinary obstruction   . Cervical spondylosis without myelopathy   . Complete tear of rotator cuff    bilateral  . COPD (chronic obstructive pulmonary disease) ([place])   . Depression   . Emphysema lung ([place]) 01/11/2018   Chest CT Feb 2019  . History of stomach ulcers   . HLD (hyperlipidemia)   . LVH (left ventricular hypertrophy)   . Personal history of tobacco use, presenting hazards to health 12/21/2015  . Poor dentition   . Sickle cell trait ([place])   . Sleep apnea   . Smoker     Surgical History: Past Surgical History:  Procedure Laterality Date  . COLONOSCOPY WITH PROPOFOL N/A 12/25/2015   Procedure: COLONOSCOPY WITH PROPOFOL;  Surgeon: Lucilla Lame, MD;  Location: [facility] ENDOSCOPY;  Service: Endoscopy;  Laterality: N/A;  . CYSTOURETHROSCOPY  08/25/12   with ureteral cathization w/wo retrograde pyelogram  . ROTATOR CUFF REPAIR Right 01/25/15  . SHOULDER ARTHROSCOPY WITH SUBACROMIAL  DECOMPRESSION Right 032416  . TRANSURETHRAL RESECTION OF PROSTATE  08/25/12    Home Medications:  Allergies as of 12/23/2018      Reactions   Penicillin G Other (See Comments)      Medication List       Accurate as of December 23, 2018  3:02 PM. Always use your most recent med list.        aspirin EC 81 MG tablet Take 1 tablet (81 mg total) by mouth daily.   atorvastatin 80 MG tablet Commonly known as:  LIPITOR Take 1 tablet (80 mg total) by mouth at bedtime. For cholesterol   buPROPion 150 MG 24 hr tablet Commonly known as:  WELLBUTRIN XL Take 1 tablet (150 mg total) by mouth daily.   ezetimibe 10 MG tablet Commonly known as:  ZETIA Take 1 tablet (10 mg total) by mouth daily.   finasteride 5 MG tablet Commonly known as:  PROSCAR Take 1 tablet (5 mg total) by mouth daily.   sertraline 50 MG tablet Commonly known as:  ZOLOFT Take 1 tablet (50 mg total) by mouth daily.   sildenafil 20 MG tablet Commonly known as:  REVATIO Take 3 to 5 tablets two hours before intercouse on an empty stomach.  Do not take with nitrates.   tamsulosin 0.4 MG Caps capsule Commonly known as:  FLOMAX Take 1 capsule (0.4 mg total) by mouth daily.   vitamin B-12  500 MCG tablet Commonly known as:  CYANOCOBALAMIN Take 1 tablet (500 mcg total) by mouth daily.   Vitamin D (Ergocalciferol) 1.25 MG (50000 UT) Caps capsule Commonly known as:  DRISDOL Take 1 capsule (50,000 Units total) by mouth every 7 (seven) days.       Allergies:  Allergies  Allergen Reactions  . Penicillin G Other (See Comments)    Family History: Family History  Problem Relation Age of Onset  . Aneurysm Mother   . Hypertension Mother   . Diabetes Father   . Hypertension Father   . Prostate cancer Father   . Lupus Father   . Cancer Maternal Aunt   . Cancer Maternal Uncle   . Diabetes Paternal Uncle   . Heart disease Cousin   . Prostate cancer Paternal Uncle   . COPD Neg Hx   . Stroke Neg Hx   . Kidney  disease Neg Hx     Social History:  reports that he has been smoking cigarettes. He has a 17.50 pack-year smoking history. He has never used smokeless tobacco. He reports that he does not drink alcohol or use drugs.  ROS: UROLOGY Frequent Urination?: No Hard to postpone urination?: No Burning/pain with urination?: No Get up at night to urinate?: Yes Leakage of urine?: No Urine stream starts and stops?: No Trouble starting stream?: No Do you have to strain to urinate?: No Blood in urine?: No Urinary tract infection?: No Sexually transmitted disease?: No Injury to kidneys or bladder?: No Painful intercourse?: No Weak stream?: Yes Erection problems?: No Penile pain?: No  Gastrointestinal Nausea?: No Vomiting?: No Indigestion/heartburn?: No Diarrhea?: No Constipation?: No  Constitutional Fever: No Night sweats?: No Weight loss?: No Fatigue?: No  Skin Skin rash/lesions?: No Itching?: No  Eyes Blurred vision?: No Double vision?: No  Ears/Nose/Throat Sore throat?: No Sinus problems?: No  Hematologic/Lymphatic Swollen glands?: No Easy bruising?: No  Cardiovascular Leg swelling?: No Chest pain?: No  Respiratory Cough?: No Shortness of breath?: No  Endocrine Excessive thirst?: No  Musculoskeletal Back pain?: Yes Joint pain?: Yes  Neurological Headaches?: Yes Dizziness?: No  Psychologic Depression?: No Anxiety?: No  Physical Exam: Ht 5\' 10"  (1.778 m)   BMI 24.11 kg/m   Constitutional:  Well nourished. Alert and oriented, No acute distress. HEENT: [place] AT, moist mucus membranes.  Trachea midline, no masses. Cardiovascular: No clubbing, cyanosis, or edema. Respiratory: Normal respiratory effort, no increased work of breathing. Rectal: Patient with  normal sphincter tone. Anus and perineum without scarring or rashes. No rectal masses are appreciated. Prostate is approximately 55 grams, 5 mm x 3 mm nodule on the lateral portion of the left lobe.      Skin: No rashes, bruises or suspicious lesions. Lymph: No inguinal adenopathy. Neurologic: Grossly intact, no focal deficits, moving all 4 extremities. Psychiatric: Normal mood and affect.  Laboratory Data: Lab Results  Component Value Date   CREATININE 1.51 (H) 11/12/2018   Urinalysis Microscopic Exam  Ref Range and Units  WBC None 0-5/hpf  RBC None 0-2/hpf  Epithelial Cells None 0-10/hpf (non-renal)  Renal Epithelial Cells None /hpf  Casts None None seen/lpf  Cast Type None   Crystals None None seen/lpf  Crystal Type None   Mucus Threads None Not established/lp  Bacteria None None to few seen  Yeast None None seen  Trichomonas None None seen  Nitrate Negative Negative   Pertinent Imaging: Results for orders placed or performed in visit on 12/01/18  CULTURE, URINE COMPREHENSIVE  Result Value Ref  Range   Urine Culture, Comprehensive Final report    Organism ID, Bacteria Comment   Urinalysis, Complete  Result Value Ref Range   Specific Gravity, UA 1.010 1.005 - 1.030   pH, UA 6.5 5.0 - 7.5   Color, UA Yellow Yellow   Appearance Ur Clear Clear   Leukocytes, UA Negative Negative   Protein, UA Negative Negative/Trace   Glucose, UA Negative Negative   Ketones, UA Negative Negative   RBC, UA Negative Negative   Bilirubin, UA Negative Negative   Urobilinogen, Ur 0.2 0.2 - 1.0 mg/dL   Nitrite, UA Negative Negative  BLADDER SCAN AMB NON-IMAGING  Result Value Ref Range   Scan Result 16ml    Assessment & Plan:    1. Elevated PSA/abnormal exam Explained to the patient that we will obtain a free and total PSA at this time Will resubmit the claim for the MRI of the prostate with the pathology results of the prostate chips from 2013.   If this fails again, we will have him RTC in 3 months for repeat PSA  Return in about 3 months (around 03/23/2019) for IPSS, PSA and exam.  Zara Council, PA-C [place] [address], [place], [place] [address] [phone]  I, Temidayo Atanda-[place] , am acting as a scribe for [facility], PA-C  I have reviewed the above documentation for accuracy and completeness, and I agree with the above.    Zara Council, PA-C  I spent 15 minutes with this patient in a face to face visit of which greater than 50% was spent in counseling and coordination of care with the patient regarding our next steps as his first request for an MRI of the prostate was rejected.

## 2018-12-24 ENCOUNTER — Encounter: Payer: Self-pay | Admitting: *Deleted

## 2018-12-24 LAB — PSA, TOTAL AND FREE
PSA, Free Pct: 13.5 %
PSA, Free: 0.69 ng/mL
Prostate Specific Ag, Serum: 5.1 ng/mL — ABNORMAL HIGH (ref 0.0–4.0)

## 2018-12-25 ENCOUNTER — Telehealth: Payer: Self-pay | Admitting: *Deleted

## 2018-12-25 NOTE — Telephone Encounter (Signed)
Notified patient of LDCT lung cancer screening program results with recommendation for 12 month follow up imaging.  Also notified of incidental findings noted below and is encouraged to discuss further questions with PCP who will receive a copy of this not and/or the CT reports.  Patient verbalized understanding.   IMPRESSION: 1. Lung-RADS 2, benign appearance or behavior. Continue annual screening with low-dose chest CT without contrast in 12 months. 2. Diffuse bronchial wall thickening with emphysema, as above; imaging findings suggestive of underlying COPD. 3. Aortic atherosclerosis and coronary artery calcification  Aortic Atherosclerosis (ICD10-I70.0) and Emphysema (ICD10-J43.9).

## 2018-12-27 ENCOUNTER — Telehealth: Payer: Self-pay

## 2018-12-27 NOTE — Telephone Encounter (Signed)
Patient returned call to office. Relayed Shannons message and patient verbalized understanding.

## 2018-12-27 NOTE — Telephone Encounter (Signed)
-----   Message from Nori Riis, PA-C sent at 12/27/2018  7:53 AM EST ----- Please let Manuel [name] know that his PSA is stable at 5.1, but his free and total PSA show a probability of prostate cancer of 24%.  I am going to resubmit the request for his MRI of the prostate as he did have a biopsy of his prostate at [facility] which was negative in 2013.

## 2018-12-27 NOTE — Telephone Encounter (Signed)
Called and left message on machine to return call to clinic

## 2018-12-28 ENCOUNTER — Encounter: Payer: Self-pay | Admitting: Physical Therapy

## 2018-12-28 ENCOUNTER — Encounter: Payer: Self-pay | Admitting: *Deleted

## 2018-12-28 ENCOUNTER — Ambulatory Visit: Payer: Medicaid Other | Attending: Neurology | Admitting: Physical Therapy

## 2018-12-28 DIAGNOSIS — M542 Cervicalgia: Secondary | ICD-10-CM | POA: Diagnosis not present

## 2018-12-28 NOTE — Therapy (Signed)
[facility] [address] [place], [address] Phone: [phone]   Fax:  [phone]  Physical Therapy Evaluation  Patient Details  Name: Manuel [name] MRN: [id number] Date of Birth: Jul 16, 1956 Referring Provider (PT): Vertell Novak Date: 12/28/2018  PT End of Session - 12/28/18 1714    Visit Number  1    Number of Visits  17    Date for PT Re-Evaluation  02/22/19    PT Start Time  0445    PT Stop Time  0545    PT Time Calculation (min)  60 min    Activity Tolerance  Patient tolerated treatment well    Behavior During Therapy  [facility] for tasks assessed/performed       Past Medical History:  Diagnosis Date  . Anemia   . Anxiety   . Aortic atherosclerosis ([place]) 01/11/2018   CT scan Feb 2019  . Asthma   . Bilateral carpal tunnel syndrome   . Bilateral hand numbness   . BPH without urinary obstruction   . Cervical spondylosis without myelopathy   . Complete tear of rotator cuff    bilateral  . COPD (chronic obstructive pulmonary disease) ([place])   . Depression   . Emphysema lung ([place]) 01/11/2018   Chest CT Feb 2019  . History of stomach ulcers   . HLD (hyperlipidemia)   . LVH (left ventricular hypertrophy)   . Personal history of tobacco use, presenting hazards to health 12/21/2015  . Poor dentition   . Sickle cell trait ([place])   . Sleep apnea   . Smoker     Past Surgical History:  Procedure Laterality Date  . COLONOSCOPY WITH PROPOFOL N/A 12/25/2015   Procedure: COLONOSCOPY WITH PROPOFOL;  Surgeon: Lucilla Lame, MD;  Location: [facility] ENDOSCOPY;  Service: Endoscopy;  Laterality: N/A;  . CYSTOURETHROSCOPY  08/25/12   with ureteral cathization w/wo retrograde pyelogram  . ROTATOR CUFF REPAIR Right 01/25/15  . SHOULDER ARTHROSCOPY WITH SUBACROMIAL DECOMPRESSION Right 032416  . TRANSURETHRAL RESECTION OF PROSTATE  08/25/12    There were no vitals filed for this visit.   Subjective Assessment -  12/28/18 1654    Patient is accompained by:  Family member    Pertinent History  Patient is a 63 year male that reports chronic cervical pain since 2014. Patient reports his pain has been getting worse over this time. 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 Patient reports his pain is most aggravated by lifting heavy, looking up, turning his neck, and "being still" which makes it difficult for him to sleep. Patient reports he started taking a muscle relaxer before going to bed which has helped, and that his wife massages it and this helps. Patient reports pain is (points to) lower cspine paraspinals with some pain at L shoulder, which he reports is d/t rotator cuff issues he is having treated. Patient reports his pain does radiate along all spine paraspinals to sacrum. Patient reports worst pain over past week 8/10 and best 8/10. Reports most pain with still positions, that gets a little better when he moves around. Pt denies N/V, unexplained weight fluctuation, saddle paresthesia, fever, night sweats, or unrelenting night pain at this time. Patient reports he has increased urination frequency since 2014 following prostate surgery. Denies any numbess/tingling, electrical sensations    Limitations  Sitting;Lifting;House hold activities;Standing    How  long can you sit comfortably?  pain at all times    How long can you stand comfortably?  pain at all times    How long can you walk comfortably?  helps pain    Diagnostic tests  Xray; MRI if PT fails    Patient Stated Goals  Decrease pain    Currently in Pain?  Yes    Pain Score  7     Pain Location  Neck    Pain Orientation  Right;Left;Mid    Pain Descriptors / Indicators  Throbbing;Aching;Cramping;Tightness    Pain Type  Chronic pain    Pain Radiating Towards  Along paraspinals to low back    Pain Onset  More than a month ago    Pain Frequency  Constant    Aggravating Factors   cervical  ext/rotation, sitting still, lifting    Pain Relieving Factors  massage, muscle relaxer, moving    Effect of Pain on Daily Activities  Unable to complete job duties or fall asleep without pain         OBJECTIVE  Mental Status Patient is oriented to person, place and time.  Recent memory is intact.  Remote memory is intact.  Attention span and concentration are intact.  Expressive speech is intact.  Patient's fund of knowledge is within normal limits for educational level.  SENSATION: Grossly intact to light touch bilateral UE as determined by testing dermatomes C2-T2 Proprioception and hot/cold testing deferred on this date   MUSCULOSKELETAL: Tremor: None Bulk: Normal Tone: Normal   Posture  Forward head rounded shoulders, thoracic kyphosis  Gait Gait assessment deferred on this date   Palpation TTP at bilat cervical paraspinals, suboccipitals, and UT. Some tenderness along tspine and lspine paraspinals as well  Strength R/L 5/4* Shoulder flexion (anterior deltoid/pec major/coracobrachialis, axillary n. (C5/6) and musculocutaneous n. (C5-7)) 5/5 Shoulder abduction (deltoid/supraspinatus, axillary/suprascapular n, C5) 5/5 Shoulder external rotation (infraspinatus/teres minor) 5/5* Shoulder internal rotation (subcapularis/lats/pec major) 5/5 Shoulder extension (posterior deltoid, lats, teres major, axillary/thoracodorsal n.) Cervical isometrics are strong in all directions; pain with flexion at cervical paraspinals   AROM R/L 50 Cervical Flexion 64 Cervical Extension 29 bilat Cervical Lateral Flexion 47/58 Cervical Rotation Some limitation in L shoulder IR and ER with apleys (IR T12, ER C5) with pain *Indicates pain, overpressure performed unless otherwise indicated  PROM R/L All cervical PROM wnl with minimal pain at end range ext/lateral flexion/rotation *Indicates pain, overpressure performed unless otherwise indicated  Repeated Movements Centralization  with repeated cervical traction   Passive Accessory Intervertebral Motion (PAIVM) Reproduction of neck pain with CPA C0-C5 and UPA bilaterally. Generally hypomobile throughout  Passive Physiological Intervertebral Motion (PPIVM) Normal flexion and extension with PPIVM testing   SPECIAL TESTS Spurlings Negative bilat Distraction Test: Positive Hoffman Sign (cervical cord compression): Negative bilat  ULTT Median: Negative bilat ULTT Ulnar: Negative bilat ULTT Radial: Negative bilat  Ther-Ex Levator stretch 30sec hold bilat UT stretch 30sec hold bilat Cervical retraction x10 with 3 sec hold- cuing and demo intially for proper form with carry over following Education on importance of neutral posture to prevent maladaptive length-tension relationships of cervical spine and shoulders       Objective measurements completed on examination: See above findings.                PT Education - 12/28/18 1714    Education Details  Patient was educated on diagnosis, anatomy and pathology involved, prognosis, role of PT, and was given an HEP, demonstrating exercise  with proper form following verbal and tactile cues, and was given a paper hand out to continue exercise at home. Pt was educated on and agreed to plan of care.    Person(s) Educated  Patient    Methods  Explanation;Demonstration;Tactile cues;Verbal cues;Handout    Comprehension  Verbalized understanding;Returned demonstration;Tactile cues required;Verbal cues required       PT Short Term Goals - 12/28/18 1825      PT SHORT TERM GOAL #1   Title  Pt will be independent with HEP in order to improve strength and flexbility in order to improve pain-free function at home and work.    Time  4    Period  Weeks    Status  New        PT Long Term Goals - 12/28/18 1826      PT LONG TERM GOAL #1   Title  Pt will decrease worst neck pain as reported on NPRS by at least 2 points in order to demonstrate clinically  significant reduction in back pain., complete job duties, and improve sleep hygiene     Baseline  12/28/18 8/10    Time  8    Period  Weeks    Status  New      PT LONG TERM GOAL #2   Title  Patient will demonstrate full and pain free cervical AROM to improve safety in completing work duties    Baseline  12/28/18    Time  8    Period  Weeks    Status  New      PT LONG TERM GOAL #3   Title  Patient will be able to lift and carry 30lbs 82ft, with proper lifting techniques, and no pain in order to demonstrate safety with work tasks    Baseline  12/28/18 unable to lift over 10lbs without pain    Time  8    Period  Weeks    Status  New      PT LONG TERM GOAL #4   Title  Patient will increase FOTO score to 59 to demonstrate predicted increase in functional mobility to complete ADLs    Baseline  12/28/18 49    Time  [place] - 12/28/18 1829    Clinical Impression Statement   Pt is a 62 year-old male/male referred for neck pain. PT examination reveals deficits in cervical and shoulder AROM, cervical and shoulder strength, and pain in cervical spine. 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 Pt will benefit from skilled PT services to address deficits and return to pain-free function at home and work.     Clinical Presentation  Evolving    Clinical Presentation due to:  Moderate (evolving): 1-2 personal factors/comorbidities, 3 or more body systems/activity limitations/participation restrictions      Clinical Decision Making  Moderate    Rehab Potential  Good    Clinical Impairments Affecting Rehab Potential  (+) motivation, social support (-) resources, chronicity of pain, sedentary lifestyle    PT Frequency  2x / week     PT Duration  8 weeks    PT Treatment/Interventions  ADLs/Self Care Home Management;Cryotherapy;Electrical Stimulation;Moist Heat;Traction;Ultrasound;Gait training;Therapeutic  exercise;Therapeutic activities;Patient/family education;Joint Manipulations;Spinal Manipulations;Passive range of motion;Manual techniques;Dry needling;Neuromuscular re-education;Functional mobility training;Iontophoresis 4mg /ml Dexamethasone    PT Next Visit Plan  HEP review; decrease muscle tension, postural education    PT Home Exercise Plan  levator and UT stretch, chin retractions    Consulted and Agree with Plan of Care  Patient;Family member/caregiver    Family Member Consulted  wife       Patient will benefit from skilled therapeutic intervention in order to improve the following deficits and impairments:  Decreased mobility, Increased muscle spasms, Hypomobility, Decreased activity tolerance, Decreased strength, Increased fascial restricitons, Impaired flexibility, Impaired UE functional use, Postural dysfunction, Pain, Improper body mechanics, Decreased range of motion  Visit Diagnosis: Cervicalgia     Problem List Patient Active Problem List   Diagnosis Date Noted  . Chronic kidney disease, stage III (moderate) ([place]) 11/12/2018  . Recurrent depression ([place]) 07/15/2018  . Chronic daily headache 04/12/2018  . Coronary artery disease 01/11/2018  . Aortic atherosclerosis ([place]) 01/11/2018  . Emphysema lung ([place]) 01/11/2018  . Mass of left thigh 12/30/2017  . 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 Cough 06/02/2016  . Vitamin D deficiency 01/14/2016  . Vitamin B12 deficiency 01/14/2016  . Benign neoplasm of cecum   . Benign neoplasm of descending colon   . Special screening for malignant neoplasms, colon   . Renal lesion 11/13/2015  . History of hematuria 11/13/2015  . Chronic lower back pain 11/09/2015  . BPH (benign prostatic hyperplasia) 11/01/2015  . Elevated PSA 11/01/2015  .  Hyperlipidemia 11/01/2015  . Medication monitoring encounter 11/01/2015  . Colon cancer screening 11/01/2015  . Bunion, left foot 11/01/2015  . Sickle cell trait ([place]) 01/16/2015  . Cervical spondylosis without myelopathy 01/09/2015  . Carpal tunnel syndrome 01/09/2015  . Pain in shoulder 11/28/2014  . Complete rotator cuff rupture of left shoulder 09/18/2014  . Rotator cuff syndrome of left shoulder 09/18/2014  . LVH (left ventricular hypertrophy) 08/18/2014  . Depression 10/03/2013  . Current smoker 10/03/2013  . BPH without urinary obstruction 11/30/2012   Shelton Silvas PT, DPT Shelton Silvas 12/28/2018, 6:34 PM  [facility] [address], [place], [address] Phone: [phone]   Fax:  [phone]  Name: Revis Whalin MRN: [id number] Date of Birth: July 07, 1956

## 2018-12-31 ENCOUNTER — Encounter: Payer: Self-pay | Admitting: *Deleted

## 2019-01-04 ENCOUNTER — Other Ambulatory Visit: Payer: Self-pay | Admitting: Family Medicine

## 2019-01-04 ENCOUNTER — Ambulatory Visit: Payer: Medicaid Other | Attending: Neurology

## 2019-01-04 DIAGNOSIS — M542 Cervicalgia: Secondary | ICD-10-CM | POA: Insufficient documentation

## 2019-01-04 NOTE — Therapy (Signed)
[facility] [address] [place], [address] Phone: [phone]   Fax:  [phone]  Physical Therapy Treatment  Patient Details  Name: Manuel [name] MRN: [id number] Date of Birth: 09-09-1956 Referring Provider (PT): Vertell Novak Date: 01/04/2019  PT End of Session - 01/04/19 1449    Visit Number  2    Number of Visits  17    Date for PT Re-Evaluation  02/22/19    PT Start Time  1430    PT Stop Time  1510    PT Time Calculation (min)  40 min    Activity Tolerance  Patient tolerated treatment well    Behavior During Therapy  [facility] for tasks assessed/performed       Past Medical History:  Diagnosis Date  . Anemia   . Anxiety   . Aortic atherosclerosis ([place]) 01/11/2018   CT scan Feb 2019  . Asthma   . Bilateral carpal tunnel syndrome   . Bilateral hand numbness   . BPH without urinary obstruction   . Cervical spondylosis without myelopathy   . Complete tear of rotator cuff    bilateral  . COPD (chronic obstructive pulmonary disease) ([place])   . Depression   . Emphysema lung ([place]) 01/11/2018   Chest CT Feb 2019  . History of stomach ulcers   . HLD (hyperlipidemia)   . LVH (left ventricular hypertrophy)   . Personal history of tobacco use, presenting hazards to health 12/21/2015  . Poor dentition   . Sickle cell trait ([place])   . Sleep apnea   . Smoker     Past Surgical History:  Procedure Laterality Date  . COLONOSCOPY WITH PROPOFOL N/A 12/25/2015   Procedure: COLONOSCOPY WITH PROPOFOL;  Surgeon: Lucilla Lame, MD;  Location: [facility] ENDOSCOPY;  Service: Endoscopy;  Laterality: N/A;  . CYSTOURETHROSCOPY  08/25/12   with ureteral cathization w/wo retrograde pyelogram  . ROTATOR CUFF REPAIR Right 01/25/15  . SHOULDER ARTHROSCOPY WITH SUBACROMIAL DECOMPRESSION Right 032416  . TRANSURETHRAL RESECTION OF PROSTATE  08/25/12    There were no vitals filed for this visit.  Subjective Assessment -  01/04/19 1438    Subjective  Pt reports he conitnues to have the same pain in the Right upper trap area. Pt has been working on his HEP without exacerbation of symptoms.     Pertinent History  Patient is a 63 year male that reports chronic cervical pain since 2014. Patient reports his pain has been getting worse over this time. 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 Patient reports his pain is most aggravated by lifting heavy, looking up, turning his neck, and "being still" which makes it difficult for him to sleep. Patient reports he started taking a muscle relaxer before going to bed which has helped, and that his wife massages it and this helps. Patient reports pain is (points to) lower cspine paraspinals with some pain at L shoulder, which he reports is d/t rotator cuff issues he is having treated. Patient reports his pain does radiate along all spine paraspinals to sacrum. Patient reports worst pain over past week 8/10 and best 8/10. Reports most pain with still positions, that gets a little better when he moves around. Pt denies N/V, unexplained weight fluctuation, saddle paresthesia, fever, night sweats, or unrelenting night pain at this time. Patient reports he has increased urination frequency since  2014 following prostate surgery. Denies any numbess/tingling, electrical sensations    Currently in Pain?  Yes    Pain Score  8     Pain Location  --   Lower lateral neck    Pain Orientation  Right       Ther-Ex -Levator stretch 3x30sec bilat hold bilat (from HEP, reviewed in full, with tactile cues for performance)  -UT stretch 3x30sec hold bilat (from HEP, reviewed in full, with tactile cues for performance)  -Cervical retraction x10 with 3 sec hold- cuing and demo intially for proper form with carry over following -Suboccipital stretch c self-asssted capital flexion: 3x30sec  -cervical retraction into 2 pillows 15x3secH -capital  rotaton stretch 2x30sec blat in supine  -cervicl traction manual 2x30sec -cervical towel traction 1x60sec supine (wifeunlikely able to perform)  -MFR rt cervical extensors, UT 3 min    PT Short Term Goals - 12/28/18 1825      PT SHORT TERM GOAL #1   Title  Pt will be independent with HEP in order to improve strength and flexbility in order to improve pain-free function at home and work.    Time  4    Period  Weeks    Status  New        PT Long Term Goals - 12/28/18 1826      PT LONG TERM GOAL #1   Title  Pt will decrease worst neck pain as reported on NPRS by at least 2 points in order to demonstrate clinically significant reduction in back pain., complete job duties, and improve sleep hygiene     Baseline  12/28/18 8/10    Time  8    Period  Weeks    Status  New      PT LONG TERM GOAL #2   Title  Patient will demonstrate full and pain free cervical AROM to improve safety in completing work duties    Baseline  12/28/18    Time  8    Period  Weeks    Status  New      PT LONG TERM GOAL #3   Title  Patient will be able to lift and carry 30lbs 11ft, with proper lifting techniques, and no pain in order to demonstrate safety with work tasks    Baseline  12/28/18 unable to lift over 10lbs without pain    Time  8    Period  Weeks    Status  New      PT LONG TERM GOAL #4   Title  Patient will increase FOTO score to 59 to demonstrate predicted increase in functional mobility to complete ADLs    Baseline  12/28/18 49    Time  8    Period  Weeks    Status  New            Plan - 01/04/19 1450    Clinical Impression Statement  Reviewed HEP with tactile and verbal cues for form correction. Expanded on exercises to promote capital ROM improvements, and strengthening of postural muscles. Pt is able to perform entire session without exacerbation of pain, although he arrives with 8/10 pain after taking a pain pill from his doctor. Pt making good progress overall. Explained findings  from examination and goals of treatment.     Clinical Impairments Affecting Rehab Potential  (+) motivation, social support (-) resources, chronicity of pain, sedentary lifestyle    PT Frequency  2x / week    PT Duration  8 weeks  PT Treatment/Interventions  ADLs/Self Care Home Management;Cryotherapy;Electrical Stimulation;Moist Heat;Traction;Ultrasound;Gait training;Therapeutic exercise;Therapeutic activities;Patient/family education;Joint Manipulations;Spinal Manipulations;Passive range of motion;Manual techniques;Dry needling;Neuromuscular re-education;Functional mobility training;Iontophoresis 4mg /ml Dexamethasone    PT Next Visit Plan  HEP review; decrease muscle tension, postural education    PT Home Exercise Plan  levator and UT stretch, chin retractions    Consulted and Agree with Plan of Care  Patient       Patient will benefit from skilled therapeutic intervention in order to improve the following deficits and impairments:  Decreased mobility, Increased muscle spasms, Hypomobility, Decreased activity tolerance, Decreased strength, Increased fascial restricitons, Impaired flexibility, Impaired UE functional use, Postural dysfunction, Pain, Improper body mechanics, Decreased range of motion  Visit Diagnosis: Cervicalgia     Problem List Patient Active Problem List   Diagnosis Date Noted  . Chronic kidney disease, stage III (moderate) ([place]) 11/12/2018  . Recurrent depression ([place]) 07/15/2018  . Chronic daily headache 04/12/2018  . Coronary artery disease 01/11/2018  . Aortic atherosclerosis ([place]) 01/11/2018  . Emphysema lung ([place]) 01/11/2018  . Mass of left thigh 12/30/2017  . 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 Cough 06/02/2016  . Vitamin D deficiency 01/14/2016  . Vitamin B12 deficiency 01/14/2016  . Benign neoplasm of cecum   . Benign neoplasm of descending colon   . Special screening for malignant neoplasms, colon   . Renal lesion 11/13/2015  . History of  hematuria 11/13/2015  . Chronic lower back pain 11/09/2015  . BPH (benign prostatic hyperplasia) 11/01/2015  . Elevated PSA 11/01/2015  . Hyperlipidemia 11/01/2015  . Medication monitoring encounter 11/01/2015  . Colon cancer screening 11/01/2015  . Bunion, left foot 11/01/2015  . Sickle cell trait ([place]) 01/16/2015  . Cervical spondylosis without myelopathy 01/09/2015  . Carpal tunnel syndrome 01/09/2015  . Pain in shoulder 11/28/2014  . Complete rotator cuff rupture of left shoulder 09/18/2014  . Rotator cuff syndrome of left shoulder 09/18/2014  . LVH (left ventricular hypertrophy) 08/18/2014  . Depression 10/03/2013  . Current smoker 10/03/2013  . BPH without urinary obstruction 11/30/2012    3:14 PM, 01/04/19 Etta Grandchild, PT, DPT Physical Therapist - [place] [phone] (Office)    , C 01/04/2019, 3:14 PM  [facility] [address] [place], [address] Phone: [phone]   Fax:  [phone]  Name: Derrin Currey MRN: [id number] Date of Birth: June 13, 1956

## 2019-01-10 ENCOUNTER — Telehealth: Payer: Self-pay | Admitting: Family Medicine

## 2019-01-10 ENCOUNTER — Ambulatory Visit: Payer: Medicaid Other | Admitting: Physical Therapy

## 2019-01-10 DIAGNOSIS — N183 Chronic kidney disease, stage 3 unspecified: Secondary | ICD-10-CM

## 2019-01-10 NOTE — Telephone Encounter (Signed)
Pt never went to Nephrologist and wants to be referred.  New referral placed.

## 2019-01-10 NOTE — Telephone Encounter (Signed)
Requesting return call. He would like to know who he was referred out to and reasons. He think its for his kidney. Stated that if you call today he will answer but if you call tomorrow before 11 then he will call you back.

## 2019-01-13 ENCOUNTER — Encounter: Payer: Self-pay | Admitting: Physical Therapy

## 2019-01-13 ENCOUNTER — Other Ambulatory Visit: Payer: Self-pay

## 2019-01-13 ENCOUNTER — Ambulatory Visit: Payer: Medicaid Other | Admitting: Physical Therapy

## 2019-01-13 DIAGNOSIS — M542 Cervicalgia: Secondary | ICD-10-CM | POA: Diagnosis not present

## 2019-01-13 NOTE — Therapy (Signed)
[facility] [address], [place], [address] Phone: [phone]   Fax:  [phone]  Physical Therapy Treatment  Patient Details  Name: Manuel [name] MRN: [id number] Date of Birth: 05-17-1956 Referring Provider (PT): Vertell Novak Date: 01/13/2019  PT End of Session - 01/13/19 1629    Visit Number  3    Number of Visits  17    Date for PT Re-Evaluation  02/22/19    PT Start Time  0418    PT Stop Time  0500    PT Time Calculation (min)  42 min    Activity Tolerance  Patient tolerated treatment well    Behavior During Therapy  [facility] for tasks assessed/performed       Past Medical History:  Diagnosis Date  . Anemia   . Anxiety   . Aortic atherosclerosis ([place]) 01/11/2018   CT scan Feb 2019  . Asthma   . Bilateral carpal tunnel syndrome   . Bilateral hand numbness   . BPH without urinary obstruction   . Cervical spondylosis without myelopathy   . Complete tear of rotator cuff    bilateral  . COPD (chronic obstructive pulmonary disease) ([place])   . Depression   . Emphysema lung ([place]) 01/11/2018   Chest CT Feb 2019  . History of stomach ulcers   . HLD (hyperlipidemia)   . LVH (left ventricular hypertrophy)   . Personal history of tobacco use, presenting hazards to health 12/21/2015  . Poor dentition   . Sickle cell trait ([place])   . Sleep apnea   . Smoker     Past Surgical History:  Procedure Laterality Date  . COLONOSCOPY WITH PROPOFOL N/A 12/25/2015   Procedure: COLONOSCOPY WITH PROPOFOL;  Surgeon: Lucilla Lame, MD;  Location: [facility] ENDOSCOPY;  Service: Endoscopy;  Laterality: N/A;  . CYSTOURETHROSCOPY  08/25/12   with ureteral cathization w/wo retrograde pyelogram  . ROTATOR CUFF REPAIR Right 01/25/15  . SHOULDER ARTHROSCOPY WITH SUBACROMIAL DECOMPRESSION Right 032416  . TRANSURETHRAL RESECTION OF PROSTATE  08/25/12    There were no vitals filed for this visit.  Subjective Assessment -  01/13/19 1619    Subjective  Patient reports his neck pain is about the same, that he is trying to sleep with 1 pillow instead of 3 which may be helping. Patient reports 7/10 pain today in bilat UT area    Patient is accompained by:  Family member    Pertinent History  Patient is a 63 year male that reports chronic cervical pain since 2014. Patient reports his pain has been getting worse over this time. 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 Patient reports his pain is most aggravated by lifting heavy, looking up, turning his neck, and "being still" which makes it difficult for him to sleep. Patient reports he started taking a muscle relaxer before going to bed which has helped, and that his wife massages it and this helps. Patient reports pain is (points to) lower cspine paraspinals with some pain at L shoulder, which he reports is d/t rotator cuff issues he is having treated. Patient reports his pain does radiate along all spine paraspinals to sacrum. Patient reports worst pain over past week 8/10 and best 8/10. Reports most pain with still positions, that gets a little better when he moves around. Pt denies N/V, unexplained weight fluctuation, saddle paresthesia, fever,  night sweats, or unrelenting night pain at this time. Patient reports he has increased urination frequency since 2014 following prostate surgery. Denies any numbess/tingling, electrical sensations    Limitations  Sitting;Lifting;House hold activities;Standing    How long can you sit comfortably?  pain at all times    How long can you stand comfortably?  pain at all times    How long can you walk comfortably?  helps pain    Diagnostic tests  Xray; MRI if PT fails    Patient Stated Goals  Decrease pain    Pain Onset  More than a month ago         ESTIM + heat pack HiVolt ESTIM 10 min at patient tolerated 140V increased to 120V through treatment at bilat UT/levator area .  Attempted to decrease muscle tension/pain at this area. With PT assessing patient tolerance throughout (increasing intensity as needed), monitoring skin integrity (normal), with decreased pain noted from patient   Ther-Ex - Bilat UT stretch 30sec hold with cuing for set up and proper posture with good carry over following  - Bilat levator stretch 30sec hold each side - DKTC 30sec; adding small rock side to side x20 - Supine chin tuck with 1in lift x10; with 10sec hold x8 with cuing for proper form with neutral cspine - Seated thoracic ext against rolled towel with maintained chin tuck - Standing rows 15# x10; 20# 2x 10  With demo and max cuing initially for proper posture (preventing shoulder hiking, correct scapular retraction), good self correction of cervical neutral. Good carry over following - Education on carry therex into sitting/resting posture to improve length tension relationship                        PT Education - 01/13/19 1628    Education Details  Exercise form, ESTIM education    Person(s) Educated  Patient    Methods  Explanation;Demonstration;Tactile cues;Verbal cues    Comprehension  Verbalized understanding;Returned demonstration;Verbal cues required;Tactile cues required       PT Short Term Goals - 12/28/18 1825      PT SHORT TERM GOAL #1   Title  Pt will be independent with HEP in order to improve strength and flexbility in order to improve pain-free function at home and work.    Time  4    Period  Weeks    Status  New        PT Long Term Goals - 01/13/19 1645      PT LONG TERM GOAL #1   Title  Pt will decrease worst neck pain as reported on NPRS by at least 2 points in order to demonstrate clinically significant reduction in back pain., complete job duties, and improve sleep hygiene     Baseline  01/13/19 7/10    Time  8      PT LONG TERM GOAL #2   Title  Patient will demonstrate full and pain free cervical AROM to improve safety in  completing work duties    Baseline  01/13/19    Time  8    Period  Weeks      PT LONG TERM GOAL #3   Baseline  01/13/19 unable to lift over 10lbs without pain    Time  8    Period  Weeks      PT LONG TERM GOAL #4   Title  Patient will increase FOTO score to 59 to demonstrate predicted increase in functional  mobility to complete ADLs    Baseline  01/13/19 41    Time  8    Period  Weeks    Status  On-going            Plan - 01/13/19 1708    Clinical Impression Statement  PT utilized ESTIM + heat to reduce cervical pain to allow for proper activation/posture and decreased pain with therex. Patient is able to complete all therex with accuracy, following PT cuing for neutral posture. PT educated patient on importance of neutral spine alignment and proper length-tension relationship for him to carry over proper posture into ADLs to reduce increased tension and pain. Patient verbalized and demonstrated understanding of these concepts. Patient will continue to benefit from skilled PT to continue to address postural deficits and reduce pain.     Rehab Potential  Good    Clinical Impairments Affecting Rehab Potential  (+) motivation, social support (-) resources, chronicity of pain, sedentary lifestyle    PT Frequency  2x / week    PT Duration  8 weeks    PT Treatment/Interventions  ADLs/Self Care Home Management;Cryotherapy;Electrical Stimulation;Moist Heat;Traction;Ultrasound;Gait training;Therapeutic exercise;Therapeutic activities;Patient/family education;Joint Manipulations;Spinal Manipulations;Passive range of motion;Manual techniques;Dry needling;Neuromuscular re-education;Functional mobility training;Iontophoresis 4mg /ml Dexamethasone    PT Next Visit Plan  HEP review; decrease muscle tension, postural education    PT Home Exercise Plan  levator and UT stretch, chin retractions    Consulted and Agree with Plan of Care  Patient       Patient will benefit from skilled therapeutic  intervention in order to improve the following deficits and impairments:  Decreased mobility, Increased muscle spasms, Hypomobility, Decreased activity tolerance, Decreased strength, Increased fascial restricitons, Impaired flexibility, Impaired UE functional use, Postural dysfunction, Pain, Improper body mechanics, Decreased range of motion  Visit Diagnosis: Cervicalgia     Problem List Patient Active Problem List   Diagnosis Date Noted  . Chronic kidney disease, stage III (moderate) ([place]) 11/12/2018  . Recurrent depression ([place]) 07/15/2018  . Chronic daily headache 04/12/2018  . Coronary artery disease 01/11/2018  . Aortic atherosclerosis ([place]) 01/11/2018  . Emphysema lung ([place]) 01/11/2018  . Mass of left thigh 12/30/2017  . 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 Cough 06/02/2016  . Vitamin D deficiency 01/14/2016  . Vitamin B12 deficiency 01/14/2016  . Benign neoplasm of cecum   . Benign neoplasm of descending colon   . Special screening for malignant neoplasms, colon   . Renal lesion 11/13/2015  . History of hematuria 11/13/2015  . Chronic lower back pain 11/09/2015  . BPH (benign prostatic hyperplasia) 11/01/2015  . Elevated PSA 11/01/2015  . Hyperlipidemia 11/01/2015  . Medication monitoring encounter 11/01/2015  . Colon cancer screening 11/01/2015  . Bunion, left foot 11/01/2015  . Sickle cell trait ([place]) 01/16/2015  . Cervical spondylosis without myelopathy 01/09/2015  . Carpal tunnel syndrome 01/09/2015  . Pain in shoulder 11/28/2014  . Complete rotator cuff rupture of left shoulder 09/18/2014  . Rotator cuff syndrome of left shoulder 09/18/2014  . LVH (left ventricular hypertrophy) 08/18/2014  . Depression 10/03/2013  . Current smoker 10/03/2013  . BPH without urinary obstruction 11/30/2012   Shelton Silvas PT, DPT Shelton Silvas 01/13/2019, 5:09 PM  [facility] [address] [place], [address] Phone: [phone]   Fax:  [phone]  Name: Dakwon Wenberg MRN: [id number] Date of Birth: Jun 25, 1956

## 2019-01-18 ENCOUNTER — Encounter: Payer: Self-pay | Admitting: Urology

## 2019-01-18 ENCOUNTER — Ambulatory Visit: Payer: Medicaid Other | Admitting: Physical Therapy

## 2019-01-18 NOTE — Progress Notes (Signed)
Certified letter sent today.

## 2019-01-20 ENCOUNTER — Ambulatory Visit: Payer: Medicaid Other | Admitting: Physical Therapy

## 2019-01-25 ENCOUNTER — Ambulatory Visit: Payer: Medicaid Other | Admitting: Physical Therapy

## 2019-01-27 ENCOUNTER — Ambulatory Visit: Payer: Medicaid Other | Admitting: Physical Therapy

## 2019-01-31 ENCOUNTER — Ambulatory Visit: Payer: Medicaid Other | Admitting: Physical Therapy

## 2019-02-02 ENCOUNTER — Encounter: Payer: Medicaid Other | Admitting: Physical Therapy

## 2019-02-03 ENCOUNTER — Ambulatory Visit: Payer: Medicaid Other | Admitting: Family Medicine

## 2019-02-07 ENCOUNTER — Encounter: Payer: Medicaid Other | Admitting: Physical Therapy

## 2019-02-10 ENCOUNTER — Encounter: Payer: Medicaid Other | Admitting: Physical Therapy

## 2019-02-15 ENCOUNTER — Other Ambulatory Visit: Payer: Self-pay

## 2019-02-15 ENCOUNTER — Ambulatory Visit (INDEPENDENT_AMBULATORY_CARE_PROVIDER_SITE_OTHER): Payer: Medicaid Other | Admitting: Family Medicine

## 2019-02-15 ENCOUNTER — Encounter: Payer: Self-pay | Admitting: Family Medicine

## 2019-02-15 DIAGNOSIS — Z5329 Procedure and treatment not carried out because of patient's decision for other reasons: Secondary | ICD-10-CM

## 2019-02-15 NOTE — Progress Notes (Signed)
There were no vitals taken for this visit.   Subjective:    Patient ID: Manuel [name], male    DOB: [birth year], 63 y.o.   MRN: [id number]  HPI: Manuel [name] is a 63 y.o. male  Chief Complaint  Patient presents with  . Follow-up    HPI Virtual Visit via Telephone/Video Note   I tried to connect with patient at 3:39 pm, left message that I was trying to reach him for our visit Tried again at 3:42 pm; left another message I tried one more time at 6:55 pm, no answer, went to voicemail; I left message that I was trying to meet with him for visit today; will try again another day ----------------------------------------------------------------------------------------  Depression screen [facility] 2/9 02/15/2019 11/12/2018 07/15/2018 04/12/2018 12/10/2017  Decreased Interest 0 0 0 0 0  Down, Depressed, Hopeless 0 0 2 0 0  PHQ - 2 Score 0 0 2 0 0  Altered sleeping 0 0 3 0 -  Tired, decreased energy 0 0 2 3 -  Change in appetite 0 0 1 0 -  Feeling bad or failure about yourself  0 0 0 0 -  Trouble concentrating 0 0 1 0 -  Moving slowly or fidgety/restless 0 0 0 0 -  Suicidal thoughts 0 0 0 0 -  PHQ-9 Score 0 0 9 3 -  Difficult doing work/chores Not difficult at all Not difficult at all Not difficult at all Not difficult at all -   Fall Risk  02/15/2019 11/12/2018 07/15/2018 04/12/2018 12/10/2017  Falls in the past year? 0 0 No No No  Number falls in past yr: 0 0 - - -  Injury with Fall? 0 0 - - -    Relevant past medical, surgical, family and social history reviewed Past Medical History:  Diagnosis Date  . Anemia   . Anxiety   . Aortic atherosclerosis ([place]) 01/11/2018   CT scan Feb 2019  . Asthma   . Bilateral carpal tunnel syndrome   . Bilateral hand numbness   . BPH without urinary obstruction   . Cervical spondylosis without myelopathy   . Complete tear of rotator cuff    bilateral  . COPD (chronic obstructive pulmonary disease) ([place])   . Depression   . Emphysema lung ([place])  01/11/2018   Chest CT Feb 2019  . History of stomach ulcers   . HLD (hyperlipidemia)   . LVH (left ventricular hypertrophy)   . Personal history of tobacco use, presenting hazards to health 12/21/2015  . Poor dentition   . Sickle cell trait ([place])   . Sleep apnea   . Smoker    Past Surgical History:  Procedure Laterality Date  . COLONOSCOPY WITH PROPOFOL N/A 12/25/2015   Procedure: COLONOSCOPY WITH PROPOFOL;  Surgeon: Lucilla Lame, MD;  Location: [facility] ENDOSCOPY;  Service: Endoscopy;  Laterality: N/A;  . CYSTOURETHROSCOPY  08/25/12   with ureteral cathization w/wo retrograde pyelogram  . ROTATOR CUFF REPAIR Right 01/25/15  . SHOULDER ARTHROSCOPY WITH SUBACROMIAL DECOMPRESSION Right 032416  . TRANSURETHRAL RESECTION OF PROSTATE  08/25/12   Family History  Problem Relation Age of Onset  . Aneurysm Mother   . Hypertension Mother   . Diabetes Father   . Hypertension Father   . Prostate cancer Father   . Lupus Father   . Cancer Maternal Aunt   . Cancer Maternal Uncle   . Diabetes Paternal Uncle   . Heart disease Cousin   . Prostate cancer Paternal Uncle   .  COPD Neg Hx   . Stroke Neg Hx   . Kidney disease Neg Hx    Social History   Tobacco Use  . Smoking status: Current Every Day Smoker    Packs/day: 0.50    Years: 35.00    Pack years: 17.50    Types: Cigarettes  . Smokeless tobacco: Never Used  Substance Use Topics  . Alcohol use: No  . 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 Allergies and medications reviewed  Review of Systems Per HPI unless specifically indicated above     Objective:    There were no vitals taken for this visit.  Wt Readings from Last 3 Encounters:  12/23/18 170 lb (77.1 kg)  12/01/18 168 lb (76.2 kg)  11/12/18 169 lb 4.8 oz (76.8 kg)    Physical Exam     Assessment & Plan:   Problem  List Items Addressed This Visit    None    Visit Diagnoses    No-show for appointment    -  Primary       Follow up plan: No follow-ups on file.  An after-visit summary was printed and given to the patient at [facility].  Please see the patient instructions which may contain other information and recommendations beyond what is mentioned above in the assessment and plan.  No orders of the defined types were placed in this encounter.   No orders of the defined types were placed in this encounter.

## 2019-02-17 ENCOUNTER — Encounter: Payer: Self-pay | Admitting: Family Medicine

## 2019-02-17 ENCOUNTER — Other Ambulatory Visit: Payer: Self-pay

## 2019-02-17 ENCOUNTER — Ambulatory Visit (INDEPENDENT_AMBULATORY_CARE_PROVIDER_SITE_OTHER): Payer: Medicaid Other | Admitting: Family Medicine

## 2019-02-17 DIAGNOSIS — Z5329 Procedure and treatment not carried out because of patient's decision for other reasons: Secondary | ICD-10-CM

## 2019-02-17 DIAGNOSIS — Z91199 Patient's noncompliance with other medical treatment and regimen due to unspecified reason: Secondary | ICD-10-CM

## 2019-02-17 NOTE — Progress Notes (Signed)
There were no vitals taken for this visit.   Subjective:    Patient ID: Manuel [name], male    DOB: [birth year], 63 y.o.   MRN: [id number]  HPI: Manuel [name] is a 63 y.o. male  Chief Complaint  Patient presents with  . Follow-up    HPI I called patient at 4:56 pm (I was running behind this afternoon) I called to explain that I was calling to do our telehealth visits; I apologize for running so far behind I will try again to reach them soon  I called patient at 5:43 pm after finishing up on another call I left another rmessage that I was hoping to get our visit in, sorry to have missed them again I will ask staff to reschedule   Depression screen [facility] 2/9 02/15/2019 11/12/2018 07/15/2018 04/12/2018 12/10/2017  Decreased Interest 0 0 0 0 0  Down, Depressed, Hopeless 0 0 2 0 0  PHQ - 2 Score 0 0 2 0 0  Altered sleeping 0 0 3 0 -  Tired, decreased energy 0 0 2 3 -  Change in appetite 0 0 1 0 -  Feeling bad or failure about yourself  0 0 0 0 -  Trouble concentrating 0 0 1 0 -  Moving slowly or fidgety/restless 0 0 0 0 -  Suicidal thoughts 0 0 0 0 -  PHQ-9 Score 0 0 9 3 -  Difficult doing work/chores Not difficult at all Not difficult at all Not difficult at all Not difficult at all -   Fall Risk  02/17/2019 02/15/2019 11/12/2018 07/15/2018 04/12/2018  Falls in the past year? 0 0 0 No No  Number falls in past yr: - 0 0 - -  Injury with Fall? - 0 0 - -    Relevant past medical, surgical, family and social history reviewed Past Medical History:  Diagnosis Date  . Anemia   . Anxiety   . Aortic atherosclerosis ([place]) 01/11/2018   CT scan Feb 2019  . Asthma   . Bilateral carpal tunnel syndrome   . Bilateral hand numbness   . BPH without urinary obstruction   . Cervical spondylosis without myelopathy   . Complete tear of rotator cuff    bilateral  . COPD (chronic obstructive pulmonary disease) ([place])   . Depression   . Emphysema lung ([place]) 01/11/2018   Chest CT Feb 2019  .  History of stomach ulcers   . HLD (hyperlipidemia)   . LVH (left ventricular hypertrophy)   . Personal history of tobacco use, presenting hazards to health 12/21/2015  . Poor dentition   . Sickle cell trait ([place])   . Sleep apnea   . Smoker    Past Surgical History:  Procedure Laterality Date  . COLONOSCOPY WITH PROPOFOL N/A 12/25/2015   Procedure: COLONOSCOPY WITH PROPOFOL;  Surgeon: Lucilla Lame, MD;  Location: [facility] ENDOSCOPY;  Service: Endoscopy;  Laterality: N/A;  . CYSTOURETHROSCOPY  08/25/12   with ureteral cathization w/wo retrograde pyelogram  . ROTATOR CUFF REPAIR Right 01/25/15  . SHOULDER ARTHROSCOPY WITH SUBACROMIAL DECOMPRESSION Right 032416  . TRANSURETHRAL RESECTION OF PROSTATE  08/25/12   Family History  Problem Relation Age of Onset  . Aneurysm Mother   . Hypertension Mother   . Diabetes Father   . Hypertension Father   . Prostate cancer Father   . Lupus Father   . Cancer Maternal Aunt   . Cancer Maternal Uncle   . Diabetes Paternal Uncle   .  Heart disease Cousin   . Prostate cancer Paternal Uncle   . COPD Neg Hx   . Stroke Neg Hx   . Kidney disease Neg Hx    Social History   Tobacco Use  . Smoking status: Current Every Day Smoker    Packs/day: 0.50    Years: 35.00    Pack years: 17.50    Types: Cigarettes  . Smokeless tobacco: Never Used  Substance Use Topics  . Alcohol use: No  . 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 Allergies and medications reviewed  Review of Systems Per HPI unless specifically indicated above     Objective:    There were no vitals taken for this visit.  Wt Readings from Last 3 Encounters:  12/23/18 170 lb (77.1 kg)  12/01/18 168 lb (76.2 kg)  11/12/18 169 lb 4.8 oz (76.8 kg)    Physical Exam  Results for orders placed or performed in visit on 12/23/18  PSA,  total and free  Result Value Ref Range   Prostate Specific Ag, Serum 5.1 (H) 0.0 - 4.0 ng/mL   PSA, Free 0.69 N/A ng/mL   PSA, Free Pct 13.5 %      Assessment & Plan:   Problem List Items Addressed This Visit    None    Visit Diagnoses    No-show for appointment    -  Primary       Follow up plan: No follow-ups on file.  An after-visit summary was printed and given to the patient at [place].  Please see the patient instructions which may contain other information and recommendations beyond what is mentioned above in the assessment and plan.  No orders of the defined types were placed in this encounter.   No orders of the defined types were placed in this encounter.

## 2019-02-21 ENCOUNTER — Telehealth: Payer: Self-pay | Admitting: Family Medicine

## 2019-02-21 NOTE — Telephone Encounter (Signed)
This is for an appointment. He needs an appointment please. We have tried to have two appointments with him. Please see "no show" for April 14th and April 16th. Thank you

## 2019-02-21 NOTE — Telephone Encounter (Signed)
Copied from [place] [id number]. Topic: Quick Communication - See Telephone Encounter >> Feb 21, 2019  1:33 PM Robina Ade, Helene Kelp D wrote: CRM for notification. See Telephone encounter for: 02/21/19. Patient called and said he missed a call from Dr. Sanda Klein and would like a call back from her. Please call patient back, thanks.

## 2019-02-22 NOTE — Telephone Encounter (Signed)
LVM for pt to call the office to schedule an appt. °

## 2019-02-24 ENCOUNTER — Encounter: Payer: Self-pay | Admitting: Family Medicine

## 2019-02-24 ENCOUNTER — Ambulatory Visit (INDEPENDENT_AMBULATORY_CARE_PROVIDER_SITE_OTHER): Payer: Medicaid Other | Admitting: Family Medicine

## 2019-02-24 ENCOUNTER — Other Ambulatory Visit: Payer: Self-pay

## 2019-02-24 DIAGNOSIS — M75122 Complete rotator cuff tear or rupture of left shoulder, not specified as traumatic: Secondary | ICD-10-CM

## 2019-02-24 DIAGNOSIS — E538 Deficiency of other specified B group vitamins: Secondary | ICD-10-CM

## 2019-02-24 DIAGNOSIS — I517 Cardiomegaly: Secondary | ICD-10-CM

## 2019-02-24 DIAGNOSIS — I2510 Atherosclerotic heart disease of native coronary artery without angina pectoris: Secondary | ICD-10-CM

## 2019-02-24 DIAGNOSIS — D573 Sickle-cell trait: Secondary | ICD-10-CM

## 2019-02-24 DIAGNOSIS — E559 Vitamin D deficiency, unspecified: Secondary | ICD-10-CM

## 2019-02-24 DIAGNOSIS — N183 Chronic kidney disease, stage 3 unspecified: Secondary | ICD-10-CM

## 2019-02-24 DIAGNOSIS — I700 Atherosclerosis of aorta: Secondary | ICD-10-CM

## 2019-02-24 DIAGNOSIS — R972 Elevated prostate specific antigen [PSA]: Secondary | ICD-10-CM

## 2019-02-24 DIAGNOSIS — E782 Mixed hyperlipidemia: Secondary | ICD-10-CM

## 2019-02-24 DIAGNOSIS — F17200 Nicotine dependence, unspecified, uncomplicated: Secondary | ICD-10-CM

## 2019-02-24 MED ORDER — ATORVASTATIN CALCIUM 80 MG PO TABS: 80.0000 mg | ORAL_TABLET | Freq: Every day | ORAL | 0 refills | Status: DC

## 2019-02-24 NOTE — Assessment & Plan Note (Signed)
He can come between 8:00 am to noon; arrive by 11:30 am; limit saturated fats

## 2019-02-24 NOTE — Assessment & Plan Note (Signed)
Reviewed last vitamin D readings; check vit D and continue either Rx or OTC based on that level

## 2019-02-24 NOTE — Assessment & Plan Note (Signed)
Control BP; limit salt

## 2019-02-24 NOTE — Assessment & Plan Note (Signed)
Seeing specialist.

## 2019-02-24 NOTE — Assessment & Plan Note (Signed)
Taking B12 supplementation

## 2019-02-24 NOTE — Assessment & Plan Note (Signed)
Chronic, stable, unchanged

## 2019-02-24 NOTE — Progress Notes (Signed)
There were no vitals taken for this visit.   Subjective:    Patient ID: Manuel [name], male    DOB: 1956/08/25, 63 y.o.   MRN: [id number]  HPI: Manuel [name] is a 63 y.o. male  Chief Complaint  Patient presents with  . Follow-up    HPI Virtual Visit via Telephone/Video Note  Due to national recommendations of social distancing due to the COVID-19 pandemic, an audio/video telehealth visit is felt to be most appropriate for this patient at this time.    I connected with the patient via:  telephone Call started: 2:51 pm I verified that I am speaking with the correct person using two identifiers.  Provider location: home office with door closed, earphones / headset on Patient location: home, living room Additional participants: wife Benjamine Mola  I discussed the limitations, risks, and privacy concerns of performing an evaluation and management service by telephone. I discussed the availability of in-person appointments. I explained that he/she may be responsible for charges related to this service. The patient expressed understanding and agreed to proceed.  Call ended: 3;15 pm Total length of call: 24 minutes  He had been referred to see a lung doctor, and he got a call from them; then the [employer] hit and their return call; he asked for another referral; that actually looks like chest CT referral  Chronic kidney disease; he was referred to the kidney doctor in March; he has not been there yet; "I've just kinda been laying low" He is avoiding NSAIDs; he is drinking some more water; 4 cups of water at work; some days he makes good urine, some days, not so good; he is dealing with his prostate doctor; he is waiting on a call back from his urologist; elevated PSA last done Dec 23, 2018  He sees Dr. Manuella Ghazi for his neck pain and he is going to physical therapy  High cholesterol; high lipids done in January; he did call to do the labs and was told to come in any time; he can come  between 8-noon; he is not eating a lot of food right now; not much bacon or sausage or bologna; breakfast food just once a month; no problems with his medicine  Rotator cuff tear; seeing orthopaedist; getting shots right now, only 3 per years; no surgery planned yet  Vitamin D deficiency; finished the Rx supplementation  Vitamin B12 deficiency; taking supplementation OTC  He is still smoking, about 1/2 ppd; he wants to cut back some more; this is better than he used to be  Depression screen [facility] 2/9 02/15/2019 11/12/2018 07/15/2018 04/12/2018 12/10/2017  Decreased Interest 0 0 0 0 0  Down, Depressed, Hopeless 0 0 2 0 0  PHQ - 2 Score 0 0 2 0 0  Altered sleeping 0 0 3 0 -  Tired, decreased energy 0 0 2 3 -  Change in appetite 0 0 1 0 -  Feeling bad or failure about yourself  0 0 0 0 -  Trouble concentrating 0 0 1 0 -  Moving slowly or fidgety/restless 0 0 0 0 -  Suicidal thoughts 0 0 0 0 -  PHQ-9 Score 0 0 9 3 -  Difficult doing work/chores Not difficult at all Not difficult at all Not difficult at all Not difficult at all -   Fall Risk  02/17/2019 02/15/2019 11/12/2018 07/15/2018 04/12/2018  Falls in the past year? 0 0 0 No No  Number falls in past yr: - 0 0 - -  Injury with Fall? - 0 0 - -    Relevant past medical, surgical, family and social history reviewed Past Medical History:  Diagnosis Date  . Anemia   . Anxiety   . Aortic atherosclerosis ([place]) 01/11/2018   CT scan Feb 2019  . Asthma   . Bilateral carpal tunnel syndrome   . Bilateral hand numbness   . BPH without urinary obstruction   . Cervical spondylosis without myelopathy   . Complete tear of rotator cuff    bilateral  . COPD (chronic obstructive pulmonary disease) ([place])   . Depression   . Emphysema lung ([place]) 01/11/2018   Chest CT Feb 2019  . History of stomach ulcers   . HLD (hyperlipidemia)   . LVH (left ventricular hypertrophy)   . Personal history of tobacco use, presenting hazards to health 12/21/2015  . Poor  dentition   . Sickle cell trait ([place])   . Sleep apnea   . Smoker    Past Surgical History:  Procedure Laterality Date  . COLONOSCOPY WITH PROPOFOL N/A 12/25/2015   Procedure: COLONOSCOPY WITH PROPOFOL;  Surgeon: Lucilla Lame, MD;  Location: [facility] ENDOSCOPY;  Service: Endoscopy;  Laterality: N/A;  . CYSTOURETHROSCOPY  08/25/12   with ureteral cathization w/wo retrograde pyelogram  . ROTATOR CUFF REPAIR Right 01/25/15  . SHOULDER ARTHROSCOPY WITH SUBACROMIAL DECOMPRESSION Right 032416  . TRANSURETHRAL RESECTION OF PROSTATE  08/25/12   Family History  Problem Relation Age of Onset  . Aneurysm Mother   . Hypertension Mother   . Diabetes Father   . Hypertension Father   . Prostate cancer Father   . Lupus Father   . Cancer Maternal Aunt   . Cancer Maternal Uncle   . Diabetes Paternal Uncle   . Heart disease Cousin   . Prostate cancer Paternal Uncle   . COPD Neg Hx   . Stroke Neg Hx   . Kidney disease Neg Hx    Social History   Tobacco Use  . Smoking status: Current Every Day Smoker    Packs/day: 0.50    Years: 35.00    Pack years: 17.50    Types: Cigarettes  . Smokeless tobacco: Never Used  Substance Use Topics  . Alcohol use: No  . 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 Allergies and medications reviewed  Review of Systems  Cardiovascular: Negative for chest pain.  Musculoskeletal:       Rotator cuff issue and back issue   Per HPI unless specifically indicated above     Objective:    There were no vitals taken for this visit.  Wt Readings from Last 3 Encounters:  12/23/18 170 lb (77.1 kg)  12/01/18 168 lb (76.2 kg)  11/12/18 169 lb 4.8 oz (76.8 kg)    Physical Exam Pulmonary:     Effort: No respiratory distress.  Neurological:     Mental Status: He is alert.  Psychiatric:        Speech: Speech is not  rapid and pressured, delayed or slurred.     Results for orders placed or performed in visit on 12/23/18  PSA, total and free  Result Value Ref Range   Prostate Specific Ag, Serum 5.1 (H) 0.0 - 4.0 ng/mL   PSA, Free 0.69 N/A ng/mL   PSA, Free Pct 13.5 %  Assessment & Plan:   Problem List Items Addressed This Visit      Cardiovascular and Mediastinum   LVH (left ventricular hypertrophy) (Chronic)    Control BP; limit salt      Relevant Medications   atorvastatin (LIPITOR) 80 MG tablet   Coronary artery disease (Chronic)    Continue aspirin and statin; no chest pain      Relevant Medications   atorvastatin (LIPITOR) 80 MG tablet   Aortic atherosclerosis (HCC) (Chronic)    He can come between 8:00 am to noon; arrive by 11:30 am; limit saturated fats      Relevant Medications   atorvastatin (LIPITOR) 80 MG tablet     Musculoskeletal and Integument   Complete rotator cuff rupture of left shoulder    Seeing specialist        Genitourinary   Chronic kidney disease, stage III (moderate) (HCC)    Avoiding NSAIDs; stay hydrated; urged him to please see kidney doctor        Other   Vitamin D deficiency    Reviewed last vitamin D readings; check vit D and continue either Rx or OTC based on that level      Vitamin B12 deficiency    Taking B12 supplementation      Sickle cell trait (HCC) (Chronic)    Chronic, stable, unchanged      Hyperlipidemia (Chronic)    Refilled the lipid-lowering medicine; patient will try to come in for lipids soon      Relevant Medications   atorvastatin (LIPITOR) 80 MG tablet   Elevated PSA    Followed by urologist      Current smoker    Leukemia, lung cancer, heart attacks, stroke; encouraged complete cessation         Follow up plan: Return in about 3 months (around 05/26/2019) for follow-up visit with Dr. Sanda Klein.  Meds ordered this encounter  Medications  . atorvastatin (LIPITOR) 80 MG tablet    Sig: Take 1 tablet (80  mg total) by mouth at bedtime. For cholesterol    Dispense:  90 tablet    Refill:  0    Higher dose    No orders of the defined types were placed in this encounter.

## 2019-02-24 NOTE — Assessment & Plan Note (Signed)
Refilled the lipid-lowering medicine; patient will try to come in for lipids soon

## 2019-02-24 NOTE — Assessment & Plan Note (Signed)
Followed by urologist.

## 2019-02-24 NOTE — Assessment & Plan Note (Signed)
Continue aspirin and statin; no chest pain

## 2019-02-24 NOTE — Assessment & Plan Note (Signed)
Leukemia, lung cancer, heart attacks, stroke; encouraged complete cessation

## 2019-02-24 NOTE — Assessment & Plan Note (Signed)
Avoiding NSAIDs; stay hydrated; urged him to please see kidney doctor

## 2019-02-26 ENCOUNTER — Other Ambulatory Visit: Payer: Self-pay | Admitting: Family Medicine

## 2019-02-26 DIAGNOSIS — E559 Vitamin D deficiency, unspecified: Secondary | ICD-10-CM

## 2019-02-28 NOTE — Telephone Encounter (Signed)
Per Dr. Delight Ovens notes, send in once monthly Vitamin d now that he has completed 12 week course of weekly rx.

## 2019-03-24 ENCOUNTER — Other Ambulatory Visit: Payer: Self-pay

## 2019-03-24 ENCOUNTER — Encounter: Payer: Self-pay | Admitting: Urology

## 2019-03-24 ENCOUNTER — Other Ambulatory Visit: Payer: Medicaid Other

## 2019-03-24 DIAGNOSIS — R972 Elevated prostate specific antigen [PSA]: Secondary | ICD-10-CM

## 2019-03-25 ENCOUNTER — Other Ambulatory Visit: Payer: Medicaid Other

## 2019-03-29 ENCOUNTER — Other Ambulatory Visit: Payer: Self-pay | Admitting: Family Medicine

## 2019-03-29 ENCOUNTER — Other Ambulatory Visit: Payer: Medicaid Other

## 2019-03-29 ENCOUNTER — Other Ambulatory Visit: Payer: Self-pay

## 2019-03-29 DIAGNOSIS — E559 Vitamin D deficiency, unspecified: Secondary | ICD-10-CM

## 2019-03-29 DIAGNOSIS — R972 Elevated prostate specific antigen [PSA]: Secondary | ICD-10-CM

## 2019-03-29 NOTE — Telephone Encounter (Signed)
Please call patient and let him know he can take 1000IU daily or every other day of OTC vitamin D now that he has completed the course of prescription strength vitamin D.

## 2019-03-30 ENCOUNTER — Ambulatory Visit: Payer: Medicaid Other | Admitting: Urology

## 2019-03-30 LAB — PSA: Prostate Specific Ag, Serum: 5.6 ng/mL — ABNORMAL HIGH (ref 0.0–4.0)

## 2019-04-10 NOTE — Progress Notes (Signed)
04/11/2019  2:31 PM   Manuel [name] Manuel [name], Manuel [name] [id number]  Referring provider: Arnetha Courser, MD [address] [place], [place] [address]  Chief Complaint  Patient presents with  . Elevated PSA    HPI: Manuel [name] is a 63 y.o. Black or [place] American male with an elevated PSA whose MRI of his prostate was denied presents for follow up.    Elevated PSA Benign prostate biopsy in 10/2016 - PSA 6.1 His current PSA is 5.6 (11.2)  A prostate MRI was ordered, but his insurance had denied the claim.  It had not been readdressed due to the COVID-19 pandemic.  BPH WITH LUTS  (prostate and/or bladder) IPSS score: 10/4   Previous score: 7/3  Previous PVR: 125 mL  Major complaint(s):  Weak stream, urge incontinence and urgency x several months. Denies any dysuria, hematuria or suprapubic pain.   Currently taking: tamsulosin 0.4 mg daily and finasteride 5 mg daily  His has had TURP in 2013.     Denies any recent fevers, chills, nausea or vomiting.  ? Paternal uncle with prostate cancer   IPSS    Row Name 06/08/[name] 1400         International Prostate Symptom Score   How often have you had the sensation of not emptying your bladder?  Less than half the time     How often have you had to urinate less than every two hours?  Less than half the time     How often have you found you stopped and started again several times when you urinated?  Less than 1 in 5 times     How often have you found it difficult to postpone urination?  Less than 1 in 5 times     How often have you had a weak urinary stream?  Less than half the time     How often have you had to strain to start urination?  Less than 1 in 5 times     How many times did you typically get up at night to urinate?  1 Time     Total IPSS Score  10       Quality of Life due to urinary symptoms   If you were to spend the rest of your life with your urinary condition just the way it is now how would you feel about that?   Mostly Disatisfied        Score:  1-7 Mild 8-19 Moderate [name]-35 Severe    PMH: Past Medical History:  Diagnosis Date  . Anemia   . Anxiety   . Aortic atherosclerosis ([place]) 01/11/2018   CT scan Feb 2019  . Asthma   . Bilateral carpal tunnel syndrome   . Bilateral hand numbness   . BPH without urinary obstruction   . Cervical spondylosis without myelopathy   . Complete tear of rotator cuff    bilateral  . COPD (chronic obstructive pulmonary disease) ([place])   . Depression   . Emphysema lung ([place]) 01/11/2018   Chest CT Feb 2019  . History of stomach ulcers   . HLD (hyperlipidemia)   . LVH (left ventricular hypertrophy)   . Personal history of tobacco use, presenting hazards to health 12/21/2015  . Poor dentition   . Sickle cell trait ([place])   . Sleep apnea   . Smoker     Surgical History: Past Surgical History:  Procedure Laterality Date  . COLONOSCOPY WITH PROPOFOL N/A 12/25/2015  Procedure: COLONOSCOPY WITH PROPOFOL;  Surgeon: Lucilla Lame, MD;  Location: [facility] ENDOSCOPY;  Service: Endoscopy;  Laterality: N/A;  . CYSTOURETHROSCOPY  08/25/12   with ureteral cathization w/wo retrograde pyelogram  . ROTATOR CUFF REPAIR Right 01/25/15  . SHOULDER ARTHROSCOPY WITH SUBACROMIAL DECOMPRESSION Right 032416  . TRANSURETHRAL RESECTION OF PROSTATE  08/25/12    Home Medications:  Allergies as of 04/11/2019      Reactions   Penicillin G Other (See Comments)      Medication List       Accurate as of April 11, 2019  2:31 PM. If you have any questions, ask your nurse or doctor.        aspirin EC 81 MG tablet Take 1 tablet (81 mg total) by mouth daily.   atorvastatin 80 MG tablet Commonly known as:  LIPITOR Take 1 tablet (80 mg total) by mouth at bedtime. For cholesterol   buPROPion 150 MG 24 hr tablet Commonly known as:  Wellbutrin XL Take 1 tablet (150 mg total) by mouth daily.   ezetimibe 10 MG tablet Commonly known as:  Zetia Take 1 tablet (10 mg total) by mouth  daily.   finasteride 5 MG tablet Commonly known as:  PROSCAR Take 1 tablet (5 mg total) by mouth daily.   sertraline 50 MG tablet Commonly known as:  ZOLOFT Take 1 tablet (50 mg total) by mouth daily.   sildenafil [name] MG tablet Commonly known as:  REVATIO Take 3 to 5 tablets two hours before intercouse on an empty stomach.  Do not take with nitrates.   tamsulosin 0.4 MG Caps capsule Commonly known as:  FLOMAX Take 1 capsule (0.4 mg total) by mouth daily.   vitamin B-12 500 MCG tablet Commonly known as:  CYANOCOBALAMIN Take 1 tablet (500 mcg total) by mouth daily.   Vitamin D (Ergocalciferol) 1.25 MG (50000 UT) Caps capsule Commonly known as:  DRISDOL Take 1 capsule once a month for 3 months.       Allergies:  Allergies  Allergen Reactions  . Penicillin G Other (See Comments)    Family History: Family History  Problem Relation Age of Onset  . Aneurysm Mother   . Hypertension Mother   . Diabetes Father   . Hypertension Father   . Prostate cancer Father   . Lupus Father   . Cancer Maternal Aunt   . Cancer Maternal Uncle   . Diabetes Paternal Uncle   . Heart disease Cousin   . Prostate cancer Paternal Uncle   . COPD Neg Hx   . Stroke Neg Hx   . Kidney disease Neg Hx     Social History:  reports that he has been smoking cigarettes. He has a 17.50 pack-year smoking history. He has never used smokeless tobacco. He reports that he does not drink alcohol or use drugs.  ROS: UROLOGY Frequent Urination?: No Hard to postpone urination?: No Burning/pain with urination?: No Get up at night to urinate?: No Leakage of urine?: No Urine stream starts and stops?: No Trouble starting stream?: No Do you have to strain to urinate?: No Blood in urine?: No Urinary tract infection?: No Sexually transmitted disease?: No Injury to kidneys or bladder?: No Painful intercourse?: No Weak stream?: No Erection problems?: No Penile pain?: No  Gastrointestinal Nausea?: No  Vomiting?: No Indigestion/heartburn?: No Diarrhea?: No Constipation?: No  Constitutional Fever: No Night sweats?: No Weight loss?: No Fatigue?: No  Skin Skin rash/lesions?: No Itching?: No  Eyes Blurred vision?: No Double vision?: No  Ears/Nose/Throat Sore throat?: No Sinus problems?: No  Hematologic/Lymphatic Swollen glands?: No Easy bruising?: No  Cardiovascular Leg swelling?: No Chest pain?: No  Respiratory Cough?: No Shortness of breath?: No  Endocrine Excessive thirst?: No  Musculoskeletal Back pain?: No Joint pain?: No  Neurological Headaches?: No Dizziness?: No  Psychologic Depression?: No Anxiety?: No  Physical Exam: BP 128/80 (BP Location: Left Arm, Patient Position: Sitting, Cuff Size: Normal)   Pulse 93   Ht 5\' 10"  (1.778 m)   Wt 168 lb (76.2 kg)   BMI 24.11 kg/m   Constitutional:  Well nourished. Alert and oriented, No acute distress. HEENT: [place] AT, moist mucus membranes.  Trachea midline, no masses. Cardiovascular: No clubbing, cyanosis, or edema. Respiratory: Normal respiratory effort, no increased work of breathing. GI: Abdomen is soft, non tender, non distended, no abdominal masses. Liver and spleen not palpable.  No hernias appreciated.  Stool sample for occult testing is not indicated.   GU: No CVA tenderness.  No bladder fullness or masses.  Patient with circumcised phallus.  Urethral meatus is patent.  No penile discharge. No penile lesions or rashes. Scrotum without lesions, cysts, rashes and/or edema.  Testicles are located scrotally bilaterally. No masses are appreciated in the testicles. Left and right epididymis are normal. Rectal: Patient with  normal sphincter tone. Anus and perineum without scarring or rashes. No rectal masses are appreciated. Prostate is approximately  55 grams, 7 mm x 5 mm nodule in lateral portion of left lobe.   Skin: No rashes, bruises or suspicious lesions. Lymph: No inguinal adenopathy. Neurologic:  Grossly intact, no focal deficits, moving all 4 extremities. Psychiatric: Normal mood and affect.  Laboratory Data: Lab Results  Component Value Date   CREATININE 1.51 (H) 11/12/2018    Results for orders placed or performed in visit on 05/26/[name]  PSA  Result Value Ref Range   Prostate Specific Ag, Serum 5.6 (H) 0.0 - 4.0 ng/mL   Assessment & Plan:    1. Elevated PSA/abnormal exam Will resubmit the claim for the MRI of the prostate with the pathology results of the prostate chips from 2013 and biopsy from 2017    2. BPH with LUTS IPSS score is 10/4, it is worsening Continue conservative management, avoiding bladder irritants and timed voiding's Most bothersome symptoms is/are urge and urge incontinence Continue tamsulosin 0.4 mg daily and finasteride 5 mg daily Will consider cystoscopy if prostate MRI is negative   Return for virtual visit for mri report .  Zara Council, PA-C [facility] [address], [place], [place] [address] [phone]

## 2019-04-11 ENCOUNTER — Other Ambulatory Visit: Payer: Self-pay

## 2019-04-11 ENCOUNTER — Encounter: Payer: Self-pay | Admitting: Urology

## 2019-04-11 ENCOUNTER — Ambulatory Visit (INDEPENDENT_AMBULATORY_CARE_PROVIDER_SITE_OTHER): Payer: Medicaid Other | Admitting: Urology

## 2019-04-11 VITALS — BP 128/80 | HR 93 | Ht 70.0 in | Wt 168.0 lb

## 2019-04-11 DIAGNOSIS — N138 Other obstructive and reflux uropathy: Secondary | ICD-10-CM | POA: Diagnosis not present

## 2019-04-11 DIAGNOSIS — R972 Elevated prostate specific antigen [PSA]: Secondary | ICD-10-CM | POA: Diagnosis not present

## 2019-04-11 DIAGNOSIS — R3989 Other symptoms and signs involving the genitourinary system: Secondary | ICD-10-CM | POA: Diagnosis not present

## 2019-04-11 DIAGNOSIS — N401 Enlarged prostate with lower urinary tract symptoms: Secondary | ICD-10-CM

## 2019-04-18 ENCOUNTER — Other Ambulatory Visit: Payer: Self-pay | Admitting: Nephrology

## 2019-04-18 ENCOUNTER — Other Ambulatory Visit (HOSPITAL_COMMUNITY): Payer: Self-pay | Admitting: Nephrology

## 2019-04-18 DIAGNOSIS — N183 Chronic kidney disease, stage 3 unspecified: Secondary | ICD-10-CM

## 2019-05-03 ENCOUNTER — Other Ambulatory Visit: Payer: Self-pay

## 2019-05-03 ENCOUNTER — Telehealth: Payer: Medicaid Other | Admitting: Urology

## 2019-05-17 ENCOUNTER — Other Ambulatory Visit: Payer: Medicaid Other

## 2019-05-17 ENCOUNTER — Other Ambulatory Visit: Payer: Self-pay

## 2019-05-17 ENCOUNTER — Ambulatory Visit
Admission: RE | Admit: 2019-05-17 | Discharge: 2019-05-17 | Disposition: A | Discharge status: Home / Self Care | Payer: Medicaid Other | Source: Ambulatory Visit | Attending: Nephrology | Admitting: Nephrology

## 2019-05-17 DIAGNOSIS — N183 Chronic kidney disease, stage 3 unspecified: Secondary | ICD-10-CM

## 2019-05-25 ENCOUNTER — Ambulatory Visit: Payer: Medicaid Other | Admitting: Nurse Practitioner

## 2019-05-25 ENCOUNTER — Encounter: Payer: Self-pay | Admitting: Nurse Practitioner

## 2019-05-25 ENCOUNTER — Other Ambulatory Visit: Payer: Self-pay

## 2019-05-25 VITALS — BP 112/64 | HR 73 | Temp 96.8°F | Resp 14 | Ht 70.0 in | Wt 164.8 lb

## 2019-05-25 DIAGNOSIS — N183 Chronic kidney disease, stage 3 unspecified: Secondary | ICD-10-CM

## 2019-05-25 DIAGNOSIS — F5101 Primary insomnia: Secondary | ICD-10-CM

## 2019-05-25 DIAGNOSIS — I700 Atherosclerosis of aorta: Secondary | ICD-10-CM | POA: Diagnosis not present

## 2019-05-25 DIAGNOSIS — D573 Sickle-cell trait: Secondary | ICD-10-CM | POA: Diagnosis not present

## 2019-05-25 DIAGNOSIS — J439 Emphysema, unspecified: Secondary | ICD-10-CM | POA: Diagnosis not present

## 2019-05-25 DIAGNOSIS — N401 Enlarged prostate with lower urinary tract symptoms: Secondary | ICD-10-CM

## 2019-05-25 DIAGNOSIS — F3342 Major depressive disorder, recurrent, in full remission: Secondary | ICD-10-CM

## 2019-05-25 DIAGNOSIS — E782 Mixed hyperlipidemia: Secondary | ICD-10-CM

## 2019-05-25 DIAGNOSIS — N3943 Post-void dribbling: Secondary | ICD-10-CM

## 2019-05-25 MED ORDER — EZETIMIBE 10 MG PO TABS: 10.0000 mg | ORAL_TABLET | Freq: Every day | ORAL | 1 refills | Status: DC

## 2019-05-25 MED ORDER — TRAZODONE HCL 50 MG PO TABS: 25.0000 mg | ORAL_TABLET | Freq: Every evening | ORAL | 3 refills | Status: DC | PRN

## 2019-05-25 MED ORDER — ATORVASTATIN CALCIUM 80 MG PO TABS: 80.0000 mg | ORAL_TABLET | Freq: Every day | ORAL | 1 refills | Status: DC

## 2019-05-25 NOTE — Patient Instructions (Addendum)
Bad cholesterol, also called low-density lipoprotein (LDL), carries cholesterol and other fats that your liver makes to your body tissue. If it builds up in blood vessels, LDL can cause heart disease and other health problems. Your LDL level should be below 100. If you have diabetes or a possible heart problem, your LDL should be below 70.  Eat: Eat 20 to 30 grams of soluble fiber every day. Foods such as fruits and vegetables, whole grains, beans, peas, nuts, and seeds can help lower LDL. Avoid: Saturated fats (Dairy foods - such as butter, cream, ghee, regular-fat milk and cheese. Meat - such as fatty cuts of beef, pork and lamb, processed meats like salami, sausages and the skin on chicken. Lard., fatty snack foods, cakes, biscuits, pies and deep fried foods) Avoid smoking  - Drink at least 64 ounces of water a day and definitely more when you are out in the sun. Avoids NSAIDs like ibuprofen, naproxen, advil and aleve for pain.   - work on cutting down on smoknig.

## 2019-05-25 NOTE — Progress Notes (Signed)
Name: Manuel [name]   MRN: [id number]    DOB: December 17, 1955   Date:05/25/2019       Progress Note  Subjective  Chief Complaint  Chief Complaint  Patient presents with  . Follow-up    HPI  Hyperlipidemia Patient rx atorvastatin 80mg  daily, zetia 10mg  daily Takes medications as prescribed with nomissed doses a month.  Diet: vegetables as often as he can; Fried foods rarely Denies myalgias Lab Results  Component Value Date   CHOL 228 (H) 11/12/2018   HDL 48 11/12/2018   LDLCALC 149 (H) 11/12/2018   TRIG 174 (H) 11/12/2018   CHOLHDL 4.8 11/12/2018   Emphysema Smokes 0.5ppd, not ready to quit. Denies shortness of breath or chronic cough. Not on any inhalers.   BPH He is taking flomax 0.4mg  and finesteride 5mg  daily; noticed increased dribbling.  He is taking sildenafil 20mg  for ED  Sees urology- Carlis Stable  MDD Rx. wellbutrin 150mg  daily and zoloft 50mg  daily but states he is not taking this medication regularly just taking it to help him sleep if needed.   Chronic kidney disease Sees nephrologist- Dr. Juleen China  Completed renal ultrasound last week  PHQ2/9: Depression screen [facility] 2/9 05/25/2019 02/15/2019 11/12/2018 07/15/2018 04/12/2018  Decreased Interest 0 0 0 0 0  Down, Depressed, Hopeless 0 0 0 2 0  PHQ - 2 Score 0 0 0 2 0  Altered sleeping 0 0 0 3 0  Tired, decreased energy 0 0 0 2 3  Change in appetite 0 0 0 1 0  Feeling bad or failure about yourself  0 0 0 0 0  Trouble concentrating 0 0 0 1 0  Moving slowly or fidgety/restless 0 0 0 0 0  Suicidal thoughts 0 0 0 0 0  PHQ-9 Score 0 0 0 9 3  Difficult doing work/chores Not difficult at all Not difficult at all Not difficult at all Not difficult at all Not difficult at all  Some recent data might be hidden     PHQ reviewed. Negative  Patient Active Problem List   Diagnosis Date Noted  . Chronic kidney disease, stage III (moderate) ([place]) 11/12/2018  . Recurrent depression ([place]) 07/15/2018  . Chronic daily  headache 04/12/2018  . Coronary artery disease 01/11/2018  . Aortic atherosclerosis ([place]) 01/11/2018  . Emphysema lung ([place]) 01/11/2018  . Mass of left thigh 12/30/2017  . 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 Cough 06/02/2016  . Vitamin D deficiency 01/14/2016  . Vitamin B12 deficiency 01/14/2016  . Benign neoplasm of cecum   . Benign neoplasm of descending colon   . Special screening for malignant neoplasms, colon   . Renal lesion 11/13/2015  . History of hematuria 11/13/2015  . Chronic lower back pain 11/09/2015  . BPH (benign prostatic hyperplasia) 11/01/2015  . Elevated PSA 11/01/2015  . Hyperlipidemia 11/01/2015  . Medication monitoring encounter 11/01/2015  . Colon cancer screening 11/01/2015  . Bunion, left foot 11/01/2015  . Sickle cell trait ([place]) 01/16/2015  . Cervical spondylosis without myelopathy 01/09/2015  . Carpal tunnel syndrome 01/09/2015  . Pain in shoulder 11/28/2014  . Complete rotator cuff rupture of left shoulder 09/18/2014  . Rotator cuff syndrome of left shoulder 09/18/2014  . LVH (left ventricular hypertrophy) 08/18/2014  . Depression 10/03/2013  . Current smoker 10/03/2013  . BPH without urinary obstruction 11/30/2012    Past Medical History:  Diagnosis Date  . Anemia   . Anxiety   . Aortic atherosclerosis ([place]) 01/11/2018   CT  scan Feb 2019  . Asthma   . Bilateral carpal tunnel syndrome   . Bilateral hand numbness   . BPH without urinary obstruction   . Cervical spondylosis without myelopathy   . Complete tear of rotator cuff    bilateral  . COPD (chronic obstructive pulmonary disease) ([place])   . Depression   . Emphysema lung ([place]) 01/11/2018   Chest CT Feb 2019  . History of stomach ulcers   . HLD (hyperlipidemia)   . LVH (left ventricular hypertrophy)   . Personal history of tobacco use, presenting hazards to health 12/21/2015  . Poor dentition   . Sickle cell trait ([place])   . Sleep apnea   . Smoker     Past Surgical  History:  Procedure Laterality Date  . COLONOSCOPY WITH PROPOFOL N/A 12/25/2015   Procedure: COLONOSCOPY WITH PROPOFOL;  Surgeon: Lucilla Lame, MD;  Location: [facility] ENDOSCOPY;  Service: Endoscopy;  Laterality: N/A;  . CYSTOURETHROSCOPY  08/25/12   with ureteral cathization w/wo retrograde pyelogram  . ROTATOR CUFF REPAIR Right 01/25/15  . SHOULDER ARTHROSCOPY WITH SUBACROMIAL DECOMPRESSION Right 032416  . TRANSURETHRAL RESECTION OF PROSTATE  08/25/12    Social History   Tobacco Use  . Smoking status: Current Every Day Smoker    Packs/day: 0.50    Years: 35.00    Pack years: 17.50    Types: Cigarettes  . Smokeless tobacco: Never Used  Substance Use Topics  . Alcohol use: No     Current Outpatient Medications:  .  aspirin EC 81 MG tablet, Take 1 tablet (81 mg total) by mouth daily., Disp: , Rfl:  .  atorvastatin (LIPITOR) 80 MG tablet, Take 1 tablet (80 mg total) by mouth at bedtime. For cholesterol, Disp: 90 tablet, Rfl: 0 .  buPROPion (WELLBUTRIN XL) 150 MG 24 hr tablet, Take 1 tablet (150 mg total) by mouth daily., Disp: 30 tablet, Rfl: 5 .  ezetimibe (ZETIA) 10 MG tablet, Take 1 tablet (10 mg total) by mouth daily., Disp: 90 tablet, Rfl: 3 .  finasteride (PROSCAR) 5 MG tablet, Take 1 tablet (5 mg total) by mouth daily., Disp: 90 tablet, Rfl: 3 .  sertraline (ZOLOFT) 50 MG tablet, Take 1 tablet (50 mg total) by mouth daily., Disp: 30 tablet, Rfl: 3 .  sildenafil (REVATIO) 20 MG tablet, Take 3 to 5 tablets two hours before intercouse on an empty stomach.  Do not take with nitrates., Disp: 50 tablet, Rfl: 3 .  tamsulosin (FLOMAX) 0.4 MG CAPS capsule, Take 1 capsule (0.4 mg total) by mouth daily., Disp: 90 capsule, Rfl: 3 .  vitamin B-12 (CYANOCOBALAMIN) 500 MCG tablet, Take 1 tablet (500 mcg total) by mouth daily., Disp: 30 tablet, Rfl: 11 .  Vitamin D, Ergocalciferol, (DRISDOL) 1.25 MG (50000 UT) CAPS capsule, Take 1 capsule once a month for 3 months., Disp: 3 capsule, Rfl:  0  Current Facility-Administered Medications:  .  cyanocobalamin ((VITAMIN B-12)) injection 1,000 mcg, 1,000 mcg, Intramuscular, Q30 days, Lada, Satira Anis, MD  Allergies  Allergen Reactions  . Penicillin G Other (See Comments)    Review of Systems  Constitutional: Negative for chills, fever and malaise/fatigue.  Respiratory: Negative for cough and shortness of breath.   Cardiovascular: Negative for chest pain, palpitations and leg swelling.  Gastrointestinal: Negative for abdominal pain.  Genitourinary: Positive for frequency and urgency. Negative for flank pain and hematuria.  Musculoskeletal: Negative for joint pain and myalgias.  Skin: Negative for rash.  Neurological: Negative for dizziness and headaches.  Psychiatric/Behavioral: The patient is not nervous/anxious and does not have insomnia.       No other specific complaints in a complete review of systems (except as listed in HPI above).  Objective  Vitals:   05/25/19 1335  BP: 112/64  Pulse: 73  Resp: 14  Temp: (!) 96.8 F (36 C)  TempSrc: Temporal  SpO2: 99%  Weight: 164 lb 12.8 oz (74.8 kg)  Height: 5\' 10"  (1.778 m)    Body mass index is 23.65 kg/m.  Nursing Note and Vital Signs reviewed.  Physical Exam Vitals signs reviewed.  Constitutional:      Appearance: He is well-developed.  HENT:     Head: Normocephalic and atraumatic.  Neck:     Musculoskeletal: Normal range of motion and neck supple.     Vascular: No carotid bruit.  Cardiovascular:     Heart sounds: Normal heart sounds.  Pulmonary:     Effort: Pulmonary effort is normal.     Breath sounds: Normal breath sounds.  Abdominal:     General: Bowel sounds are normal.  Musculoskeletal: Normal range of motion.  Skin:    General: Skin is warm and dry.     Capillary Refill: Capillary refill takes less than 2 seconds.  Neurological:     Mental Status: He is alert and oriented to person, place, and time.     GCS: GCS eye subscore is 4. GCS  verbal subscore is 5. GCS motor subscore is 6.     Sensory: No sensory deficit.  Psychiatric:        Speech: Speech normal.        Behavior: Behavior normal.        Thought Content: Thought content normal.        Judgment: Judgment normal.       No results found for this or any previous visit (from the past 48 hour(s)).  Assessment & Plan  1. Aortic atherosclerosis (HCC) - Lipid Profile - atorvastatin (LIPITOR) 80 MG tablet; Take 1 tablet (80 mg total) by mouth at bedtime. For cholesterol  Dispense: 90 tablet; Refill: 1 - ezetimibe (ZETIA) 10 MG tablet; Take 1 tablet (10 mg total) by mouth daily.  Dispense: 90 tablet; Refill: 1  2. Pulmonary emphysema, unspecified emphysema type ([place]) Discussed smoking cessation   3. Chronic kidney disease, stage III (moderate) (HCC) Discussed hydration, avoid NSAIDs - Basic Metabolic Panel (BMET)  4. Sickle cell trait (HCC) hydration  5. Mixed hyperlipidemia - Lipid Profile - atorvastatin (LIPITOR) 80 MG tablet; Take 1 tablet (80 mg total) by mouth at bedtime. For cholesterol  Dispense: 90 tablet; Refill: 1 - ezetimibe (ZETIA) 10 MG tablet; Take 1 tablet (10 mg total) by mouth daily.  Dispense: 90 tablet; Refill: 1  6. Primary insomnia - traZODone (DESYREL) 50 MG tablet; Take 0.5 tablets (25 mg total) by mouth at bedtime as needed for sleep.  Dispense: 30 tablet; Refill: 3  7. MDD (major depressive disorder), recurrent, in full remission ([place]) Stop PRN taking meds  8. Benign prostatic hyperplasia with post-void dribbling Worsening- follow-up with urology

## 2019-05-26 LAB — BASIC METABOLIC PANEL
BUN/Creatinine Ratio: 11 (calc) (ref 6–22)
BUN: 14 mg/dL (ref 7–25)
CO2: 30 mmol/L (ref 20–32)
Calcium: 9 mg/dL (ref 8.6–10.3)
Chloride: 101 mmol/L (ref 98–110)
Creat: 1.33 mg/dL — ABNORMAL HIGH (ref 0.70–1.25)
Glucose, Bld: 78 mg/dL (ref 65–99)
Potassium: 3.8 mmol/L (ref 3.5–5.3)
Sodium: 138 mmol/L (ref 135–146)

## 2019-05-26 LAB — LIPID PANEL
Cholesterol: 146 mg/dL (ref <200)
HDL: 43 mg/dL (ref >=40)
LDL Cholesterol (Calc): 79 mg/dL (calc)
Non-HDL Cholesterol (Calc): 103 mg/dL (calc) (ref <130)
Total CHOL/HDL Ratio: 3.4 (calc) (ref <5.0)
Triglycerides: 143 mg/dL (ref <150)

## 2019-05-31 ENCOUNTER — Telehealth: Payer: Self-pay | Admitting: Urology

## 2019-05-31 NOTE — Telephone Encounter (Signed)
Would you check on the status of Mr. Manuel [name] prostate MRI?  I reorder the scan as his first scan was denied.  He did have a prior biopsy, so hopefully if we resubmit his information, his insurance will cover the scan.

## 2019-06-01 ENCOUNTER — Ambulatory Visit: Payer: Medicaid Other | Admitting: Urology

## 2019-06-02 NOTE — Telephone Encounter (Signed)
Just Anson Crofts has called him several times in the past and he has not returned the call and now his MB is full. She will keep trying. If you talk to him let him know to call them @ [phone] to schedule    Sharyn Lull

## 2019-06-02 NOTE — Telephone Encounter (Signed)
It has been approved I have sent Manuela Schwartz a message to call him to get him scheduled   Thanks, Sharyn Lull

## 2019-06-03 ENCOUNTER — Encounter: Payer: Self-pay | Admitting: Family Medicine

## 2019-06-24 NOTE — Telephone Encounter (Signed)
error 

## 2019-07-12 ENCOUNTER — Telehealth: Payer: Self-pay | Admitting: Urology

## 2019-07-12 NOTE — Telephone Encounter (Signed)
Patient called you back I transferred him over to scheduling to get the MRI scheduled   Sharyn Lull

## 2019-07-12 NOTE — Telephone Encounter (Signed)
I left a message on Manuel [name] voicemail to call us to schedule his prostate MRI.

## 2019-07-18 ENCOUNTER — Other Ambulatory Visit: Payer: Self-pay | Admitting: Family Medicine

## 2019-07-18 DIAGNOSIS — E559 Vitamin D deficiency, unspecified: Secondary | ICD-10-CM

## 2019-07-18 NOTE — Telephone Encounter (Signed)
Requested medication (s) are due for refill today: yes  Requested medication (s) are on the active medication list: yes  Last refill: 02/28/2019    #3    0 refills  Future visit scheduled yes  11/25/2019  Notes to clinic:not delegated  Requested Prescriptions  Pending Prescriptions Disp Refills   Vitamin D, Ergocalciferol, (DRISDOL) 1.25 MG (50000 UT) CAPS capsule [Pharmacy Med Name: VITAMIN D (ERGOCALCIFEROL) 1.25 MG] 3 capsule 0    Sig: TAKE 1 CAPSULE BY MOUTH ONCE A MONTH FOR3 MONTHS     Endocrinology:  Vitamins - Vitamin D Supplementation Failed - 07/18/2019  6:00 PM      Failed - 50,000 IU strengths are not delegated      Failed - Phosphate in normal range and within 360 days    No results found for: PHOS       Failed - Vitamin D in normal range and within 360 days    Vit D, 25-Hydroxy  Date Value Ref Range Status  11/12/2018 15 (L) 30 - 100 ng/mL Final    Comment:    Vitamin D Status         25-OH Vitamin D: . Deficiency:                    <20 ng/mL Insufficiency:             20 - 29 ng/mL Optimal:                 > or = 30 ng/mL . For 25-OH Vitamin D testing on patients on  D2-supplementation and patients for whom quantitation  of D2 and D3 fractions is required, the QuestAssureD(TM) 25-OH VIT D, (D2,D3), LC/MS/MS is recommended: order  code [phone] (patients >36yrs). . For more information on this test, go to: http://education.questdiagnostics.com/faq/FAQ163 (This link is being provided for  informational/educational purposes only.)          Passed - Ca in normal range and within 360 days    Calcium  Date Value Ref Range Status  05/25/2019 9.0 8.6 - 10.3 mg/dL Final         Passed - Valid encounter within last 12 months    Recent Outpatient Visits          1 month ago Aortic atherosclerosis [facility])   [place], NP   4 months ago Aortic atherosclerosis [facility])   [facility] Lada, Satira Anis, MD   5  months ago No-show for appointment   [facility] Lada, Satira Anis, MD   5 months ago No-show for appointment   [facility] Lada, Satira Anis, MD   8 months ago Nontraumatic complete tear of left rotator cuff   [place], Satira Anis, MD      Future Appointments            In 1 week [place], Gordan Payment [place]   In 4 months Delsa Grana, PA-C [facility], [facility]

## 2019-07-20 NOTE — Telephone Encounter (Signed)
MAIL BOX FULL PT NEEDS APPT PER DR TAPIA

## 2019-07-25 ENCOUNTER — Ambulatory Visit
Admission: RE | Admit: 2019-07-25 | Discharge: 2019-07-25 | Disposition: A | Discharge status: Home / Self Care | Payer: Medicaid Other | Source: Ambulatory Visit | Attending: Urology | Admitting: Urology

## 2019-07-25 ENCOUNTER — Other Ambulatory Visit: Payer: Self-pay

## 2019-07-25 DIAGNOSIS — R3989 Other symptoms and signs involving the genitourinary system: Secondary | ICD-10-CM | POA: Diagnosis present

## 2019-07-25 DIAGNOSIS — R972 Elevated prostate specific antigen [PSA]: Secondary | ICD-10-CM | POA: Diagnosis not present

## 2019-07-25 LAB — POCT I-STAT CREATININE: Creatinine, Ser: 1.4 mg/dL — ABNORMAL HIGH (ref 0.61–1.24)

## 2019-07-25 MED ORDER — GADOBUTROL 1 MMOL/ML IV SOLN
7.0000 mL | Freq: Once | INTRAVENOUS | Status: AC | PRN
  Administered 2019-07-25: 7 mL via INTRAVENOUS

## 2019-07-27 NOTE — Progress Notes (Signed)
07/28/2019  2:45 PM   Manuel [name] 1956/10/22 BP:[id number]  Referring provider: Arnetha Courser, MD [address] [place],  [place] [address]  Chief Complaint  Patient presents with  . Elevated PSA    HPI: Manuel [name] is a 63 y.o.  male with an elevated PSA and abnormal prostate exam who presents today to discuss the results of the prostate MRI.     Elevated PSA Benign prostate biopsy in 10/2016 - PSA 6.1 His current PSA is 5.6 (11.2)  Prostate MRI on 07/25/2019 revealed a small PI-RADS category 4 lesion in the right posterolateral peripheral zone of the prostate apex is present. Targeting data sent to [place].  Considerable benign prostatic hypertrophy, prostate volume 78.53 cubic cm.  Thin peripheral zone with generalized low T2 signal, probably postinflammatory.  Sigmoid colon diverticulosis.   BPH WITH LUTS  (prostate and/or bladder) PVR: 171 mL      Previous score: 10/4  Previous PVR: 125 mL  Major complaint(s):  Frequency, intermittency, hesitancy, straining to urinate and a weak urinary stream.  Patient denies any gross hematuria, dysuria or suprapubic/flank pain.  Patient denies any fevers, chills, nausea or vomiting.   Currently taking: tamsulosin 0.4 mg daily and finasteride 5 mg daily  His has had TURP in 2013.     Denies any recent fevers, chills, nausea or vomiting.  ? Paternal uncle with prostate cancer   His urinary symptoms have become so bothersome to him that he is seeking disability.  He states he is having incontinence and having to bring clothes to work to change into when he has incontinence.     PMH: Past Medical History:  Diagnosis Date  . Anemia   . Anxiety   . Aortic atherosclerosis ([place]) 01/11/2018   CT scan Feb 2019  . Asthma   . Bilateral carpal tunnel syndrome   . Bilateral hand numbness   . BPH without urinary obstruction   . Cervical spondylosis without myelopathy   . Complete tear of rotator cuff    bilateral  . COPD  (chronic obstructive pulmonary disease) ([place])   . Depression   . Emphysema lung ([place]) 01/11/2018   Chest CT Feb 2019  . History of stomach ulcers   . HLD (hyperlipidemia)   . LVH (left ventricular hypertrophy)   . Personal history of tobacco use, presenting hazards to health 12/21/2015  . Poor dentition   . Sickle cell trait ([place])   . Sleep apnea   . Smoker     Surgical History: Past Surgical History:  Procedure Laterality Date  . COLONOSCOPY WITH PROPOFOL N/A 12/25/2015   Procedure: COLONOSCOPY WITH PROPOFOL;  Surgeon: Lucilla Lame, MD;  Location: [facility] ENDOSCOPY;  Service: Endoscopy;  Laterality: N/A;  . CYSTOURETHROSCOPY  08/25/12   with ureteral cathization w/wo retrograde pyelogram  . ROTATOR CUFF REPAIR Right 01/25/15  . SHOULDER ARTHROSCOPY WITH SUBACROMIAL DECOMPRESSION Right 032416  . TRANSURETHRAL RESECTION OF PROSTATE  08/25/12    Home Medications:  Allergies as of 07/28/2019      Reactions   Penicillin G Other (See Comments)      Medication List       Accurate as of July 28, 2019  2:45 PM. If you have any questions, ask your nurse or doctor.        aspirin EC 81 MG tablet Take 1 tablet (81 mg total) by mouth daily.   atorvastatin 80 MG tablet Commonly known as: LIPITOR Take 1 tablet (80 mg total) by  mouth at bedtime. For cholesterol   ezetimibe 10 MG tablet Commonly known as: Zetia Take 1 tablet (10 mg total) by mouth daily.   finasteride 5 MG tablet Commonly known as: PROSCAR Take 1 tablet (5 mg total) by mouth daily.   sildenafil 20 MG tablet Commonly known as: REVATIO Take 3 to 5 tablets two hours before intercouse on an empty stomach.  Do not take with nitrates.   tamsulosin 0.4 MG Caps capsule Commonly known as: FLOMAX Take 1 capsule (0.4 mg total) by mouth daily.   traZODone 50 MG tablet Commonly known as: DESYREL Take 0.5 tablets (25 mg total) by mouth at bedtime as needed for sleep.   vitamin B-12 500 MCG tablet Commonly known as:  CYANOCOBALAMIN Take 1 tablet (500 mcg total) by mouth daily.   Vitamin D (Ergocalciferol) 1.25 MG (50000 UT) Caps capsule Commonly known as: DRISDOL Take 1 capsule once a month for 3 months.       Allergies:  Allergies  Allergen Reactions  . Penicillin G Other (See Comments)    Family History: Family History  Problem Relation Age of Onset  . Aneurysm Mother   . Hypertension Mother   . Diabetes Father   . Hypertension Father   . Prostate cancer Father   . Lupus Father   . Cancer Maternal Aunt   . Cancer Maternal Uncle   . Diabetes Paternal Uncle   . Heart disease Cousin   . Prostate cancer Paternal Uncle   . COPD Neg Hx   . Stroke Neg Hx   . Kidney disease Neg Hx     Social History:  reports that he has been smoking cigarettes. He has a 17.50 pack-year smoking history. He has never used smokeless tobacco. He reports that he does not drink alcohol or use drugs.  ROS: UROLOGY Frequent Urination?: Yes Hard to postpone urination?: No Burning/pain with urination?: No Get up at night to urinate?: No Leakage of urine?: No Urine stream starts and stops?: Yes Trouble starting stream?: Yes Do you have to strain to urinate?: Yes Blood in urine?: No Urinary tract infection?: No Sexually transmitted disease?: No Injury to kidneys or bladder?: Yes Painful intercourse?: No Weak stream?: Yes Erection problems?: No Penile pain?: No  Gastrointestinal Nausea?: No Vomiting?: No Indigestion/heartburn?: No Diarrhea?: No Constipation?: No  Constitutional Fever: No Night sweats?: No Weight loss?: No Fatigue?: No  Skin Skin rash/lesions?: No Itching?: No  Eyes Blurred vision?: No Double vision?: No  Ears/Nose/Throat Sore throat?: No Sinus problems?: No  Hematologic/Lymphatic Swollen glands?: No Easy bruising?: No  Cardiovascular Leg swelling?: No Chest pain?: No  Respiratory Cough?: Yes Shortness of breath?: No  Endocrine Excessive thirst?: No   Musculoskeletal Back pain?: Yes Joint pain?: Yes  Neurological Headaches?: Yes Dizziness?: No  Psychologic Depression?: Yes Anxiety?: No  Physical Exam: BP 135/78 (BP Location: Left Arm, Patient Position: Sitting, Cuff Size: Normal)   Pulse 61   Ht 5\' 10"  (1.778 m)   Wt 166 lb 8 oz (75.5 kg)   BMI 23.89 kg/m   Constitutional:  Well nourished. Alert and oriented, No acute distress. HEENT: [place] AT, moist mucus membranes.  Trachea midline, no masses. Cardiovascular: No clubbing, cyanosis, or edema. Respiratory: Normal respiratory effort, no increased work of breathing. Neurologic: Grossly intact, no focal deficits, moving all 4 extremities. Psychiatric: Normal mood and affect.  Laboratory Data: Lab Results  Component Value Date   CREATININE 1.40 (H) 07/25/2019    Results for orders placed or performed in  visit on 07/28/19  Bladder Scan (Post Void Residual) in office  Result Value Ref Range   Scan Result 171    Pertinent Imaging CLINICAL DATA:  Elevated PSA level of 5.6 on Mar 29, 2019. Prior benign prostate biopsy in 2017. Urge incontinence and urgency.  EXAM: MR PROSTATE WITHOUT AND WITH CONTRAST  TECHNIQUE: Multiplanar multisequence MRI images were obtained of the pelvis centered about the prostate. Pre and post contrast images were obtained.  CONTRAST:  23mL GADAVIST GADOBUTROL 1 MMOL/ML IV SOLN  COMPARISON:  None.  FINDINGS: Prostate:  Generalized thinning of the peripheral zone with reduced T2 signal and ill definition of the peripheral zone.  Considerable benign prostatic hypertrophy.  Region of interest # 1: PI-RADS category 4 lesion in the right posterolateral peripheral zone of the prostate apex, with reduced T2 signal poorly matched on the ADC map images but with some mild asymmetric early enhancement. This lesion measures 2.0 by 0.9 by 0.6 cm with a volume of 0.58 cubic cm.  Volume: 3D volumetric analysis: Prostate volume 78.53 cubic  cm (6.0 by 5.3 by 5.2 cm).  Transcapsular spread:  Absent  Seminal vesicle involvement: Absent  Neurovascular bundle involvement: Absent  Pelvic adenopathy: Absent  Bone metastasis: Absent  Other findings: Sigmoid colon diverticulosis.  IMPRESSION: 1. A small PI-RADS category 4 lesion in the right posterolateral peripheral zone of the prostate apex is present. Targeting data sent to [place]. 2. Considerable benign prostatic hypertrophy, prostate volume 78.53 cubic cm. 3. Thin peripheral zone with generalized low T2 signal, probably postinflammatory. 4. Sigmoid colon diverticulosis.   Electronically Signed   By: Van Clines M.D.   On: 07/25/2019 15:32  Assessment & Plan:    1. Elevated PSA/abnormal exam Prostate MRI findings of PI-RADS category 4 lesion in the right posterolateral peripheral zone of the prostate apex, with reduced T2 signal poorly matched on the ADC map images but with some mild asymmetric early enhancement. This lesion measures 2.0 by 0.9 by 0.6 cm with a volume of 0.58 cubic cm. Patient will be schedule for a fusion biopsy of the prostate.  The procedure is explained and the risks involved, such as blood in urine, blood in stool, blood in semen, infection, urinary retention, and on rare occasions sepsis and death.  Patient understands the risks as explained to him and he wishes to proceed.    2. BPH with LUTS Continue conservative management, avoiding bladder irritants and timed voiding's Most bothersome symptoms is/are urge and urge incontinence Continue tamsulosin 0.4 mg daily and finasteride 5 mg daily Patient on maximal medical therapy - pending fusion biopsy results would like to undergo cystoscopy for evaluation for BOO  Return for to [place] for fusion biopsy .  Zara Council, PA-C [facility] [address] [place], [place] [address] [phone]

## 2019-07-28 ENCOUNTER — Ambulatory Visit (INDEPENDENT_AMBULATORY_CARE_PROVIDER_SITE_OTHER): Payer: Medicaid Other | Admitting: Urology

## 2019-07-28 ENCOUNTER — Encounter: Payer: Self-pay | Admitting: Urology

## 2019-07-28 ENCOUNTER — Other Ambulatory Visit: Payer: Self-pay

## 2019-07-28 VITALS — BP 135/78 | HR 61 | Ht 70.0 in | Wt 166.5 lb

## 2019-07-28 DIAGNOSIS — N138 Other obstructive and reflux uropathy: Secondary | ICD-10-CM | POA: Diagnosis not present

## 2019-07-28 DIAGNOSIS — N401 Enlarged prostate with lower urinary tract symptoms: Secondary | ICD-10-CM | POA: Diagnosis not present

## 2019-07-28 DIAGNOSIS — R972 Elevated prostate specific antigen [PSA]: Secondary | ICD-10-CM

## 2019-07-28 DIAGNOSIS — R3989 Other symptoms and signs involving the genitourinary system: Secondary | ICD-10-CM

## 2019-07-28 LAB — BLADDER SCAN AMB NON-IMAGING: Scan Result: 171

## 2019-08-23 ENCOUNTER — Other Ambulatory Visit: Payer: Self-pay | Admitting: Family Medicine

## 2019-08-23 DIAGNOSIS — E559 Vitamin D deficiency, unspecified: Secondary | ICD-10-CM

## 2019-08-23 NOTE — Telephone Encounter (Signed)
Requested medication (s) are due for refill today: yes  Requested medication (s) are on the active medication list: yes  Last refill: 07/18/2019  Future visit scheduled: yes  Notes to clinic:  Refill cannot be delegated    Requested Prescriptions  Pending Prescriptions Disp Refills   Vitamin D, Ergocalciferol, (DRISDOL) 1.25 MG (50000 UT) CAPS capsule [Pharmacy Med Name: VITAMIN D (ERGOCALCIFEROL) 1.25 MG] 3 capsule 0    Sig: TAKE 1 CAPSULE BY MOUTH ONCE A MONTH FOR3 MONTHS     Endocrinology:  Vitamins - Vitamin D Supplementation Failed - 08/23/2019  8:18 AM      Failed - 50,000 IU strengths are not delegated      Failed - Phosphate in normal range and within 360 days    No results found for: PHOS       Failed - Vitamin D in normal range and within 360 days    Vit D, 25-Hydroxy  Date Value Ref Range Status  11/12/2018 15 (L) 30 - 100 ng/mL Final    Comment:    Vitamin D Status         25-OH Vitamin D: . Deficiency:                    <20 ng/mL Insufficiency:             20 - 29 ng/mL Optimal:                 > or = 30 ng/mL . For 25-OH Vitamin D testing on patients on  D2-supplementation and patients for whom quantitation  of D2 and D3 fractions is required, the QuestAssureD(TM) 25-OH VIT D, (D2,D3), LC/MS/MS is recommended: order  code [id number] (patients >9yrs). . For more information on this test, go to: http://education.questdiagnostics.com/faq/FAQ163 (This link is being provided for  informational/educational purposes only.)          Passed - Ca in normal range and within 360 days    Calcium  Date Value Ref Range Status  05/25/2019 9.0 8.6 - 10.3 mg/dL Final         Passed - Valid encounter within last 12 months    Recent Outpatient Visits          3 months ago Aortic atherosclerosis [facility])   [place], NP   6 months ago Aortic atherosclerosis [facility])   [facility] Lada, Satira Anis, MD   6 months  ago No-show for appointment   [facility] Lada, Satira Anis, MD   6 months ago No-show for appointment   [facility] Lada, Satira Anis, MD   9 months ago Nontraumatic complete tear of left rotator cuff   [facility] Lada, Satira Anis, MD      Future Appointments            In 3 weeks Stoioff, Ronda Fairly, MD [place]   In 3 months Delsa Grana, PA-C [facility], [facility]

## 2019-08-23 NOTE — Telephone Encounter (Signed)
Per JAMIE tell the patient to take over the counter vit d 1000 2x a day.

## 2019-08-26 ENCOUNTER — Ambulatory Visit (INDEPENDENT_AMBULATORY_CARE_PROVIDER_SITE_OTHER): Payer: Medicaid Other

## 2019-08-26 ENCOUNTER — Other Ambulatory Visit: Payer: Self-pay

## 2019-08-26 DIAGNOSIS — Z23 Encounter for immunization: Secondary | ICD-10-CM

## 2019-09-13 ENCOUNTER — Other Ambulatory Visit: Payer: Self-pay | Admitting: Urology

## 2019-09-14 ENCOUNTER — Other Ambulatory Visit: Payer: Self-pay

## 2019-09-14 ENCOUNTER — Telehealth (INDEPENDENT_AMBULATORY_CARE_PROVIDER_SITE_OTHER): Payer: Medicaid Other | Admitting: Urology

## 2019-09-14 ENCOUNTER — Ambulatory Visit: Payer: Medicaid Other | Admitting: Urology

## 2019-09-14 ENCOUNTER — Telehealth: Payer: Self-pay | Admitting: Urology

## 2019-09-14 DIAGNOSIS — N138 Other obstructive and reflux uropathy: Secondary | ICD-10-CM

## 2019-09-14 DIAGNOSIS — R3989 Other symptoms and signs involving the genitourinary system: Secondary | ICD-10-CM

## 2019-09-14 DIAGNOSIS — R972 Elevated prostate specific antigen [PSA]: Secondary | ICD-10-CM | POA: Diagnosis not present

## 2019-09-14 NOTE — Telephone Encounter (Signed)
Would you schedule a cystoscopy for Manuel [name] with one of the physicians for BOO?

## 2019-09-15 NOTE — Telephone Encounter (Signed)
He has been scheduled and he is aware  Manuel [name]

## 2019-09-19 ENCOUNTER — Other Ambulatory Visit: Payer: Self-pay | Admitting: Urology

## 2019-09-20 NOTE — Progress Notes (Signed)
Virtual Visit via Telephone Note  I connected with Manuel [name] on 09/20/19 at  3:00 PM EST by telephone and verified that I am speaking with the correct person using two identifiers.  Location: Patient: Home Provider: Office    I discussed the limitations, risks, security and privacy concerns of performing an evaluation and management service by telephone and the availability of in person appointments. I also discussed with the patient that there may be a patient responsible charge related to this service. The patient expressed understanding and agreed to proceed.   History of Present Illness: Manuel [name] is a 12 male who is s/p fusion prostate biopsy at [facility] for an elevated PSA 5.6 (11.2).  Prostate MRI on 07/25/2019 revealed a small PI-RADS category 4 lesion in the right posterolateral peripheral zone of the prostate apex is present. Targeting data sent to [facility].  Considerable benign prostatic hypertrophy, prostate volume 78.53 cubic cm.  Thin peripheral zone with generalized low T2 signal, probably postinflammatory.  Sigmoid colon diverticulosis.  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    He continues to have significant urinary symptoms on maximum medical therapy.  He would like to proceed with the cystoscopy to be evaluated for a bladder outlet procedure.  Prostate volume 78.53.      Observations/Objective: Patient does not sound distressed.  He is answering questions appropriately.  Assessment and Plan:  1. Elevated PSA/abnormal exam Benign prostate biopsy in 10/2016 - PSA 6.1 HGPIN fusion prostate biopsy in 09/2019 - PSA 5.6 (11.2) RTC in 6 months for PSA and DRE  2. BPH with LU TS Failed maximal medical therapy We will schedule cystoscopically to evaluate candidacy for bladder outlet procedure I have explained to the patient that they will  be scheduled for a cystoscopy in our office to evaluate their  bladder.  The cystoscopy consists of passing a tube with a lens up through their urethra and into their urinary bladder.   We will inject the urethra with a lidocaine gel prior to introducing the cystoscope to help with any discomfort during the procedure.   After the procedure, they might experience blood in the urine and discomfort with urination.  This will abate after the first few voids.  I have  encouraged the patient to increase water intake  during this time.  Patient denies any allergies to lidocaine.    Follow Up Instructions:  Return for cystoscopy   I discussed the assessment and treatment plan with the patient. The patient was provided an opportunity to ask questions and all were answered. The patient agreed with the plan and demonstrated an understanding of the instructions.   The patient was advised to call back or seek an in-person evaluation if the symptoms worsen or if the condition fails to improve as anticipated.  I provided  20 minutes of non-face-to-face time during this encounter.   Avice Funchess, PA-C

## 2019-10-17 ENCOUNTER — Encounter: Payer: Self-pay | Admitting: Urology

## 2019-10-17 ENCOUNTER — Telehealth: Payer: Self-pay | Admitting: General Practice

## 2019-10-17 ENCOUNTER — Other Ambulatory Visit: Payer: Self-pay

## 2019-10-17 ENCOUNTER — Ambulatory Visit (INDEPENDENT_AMBULATORY_CARE_PROVIDER_SITE_OTHER): Payer: Medicaid Other | Admitting: Urology

## 2019-10-17 VITALS — BP 153/84 | HR 76 | Ht 70.0 in | Wt 166.0 lb

## 2019-10-17 DIAGNOSIS — N401 Enlarged prostate with lower urinary tract symptoms: Secondary | ICD-10-CM | POA: Diagnosis not present

## 2019-10-17 DIAGNOSIS — N138 Other obstructive and reflux uropathy: Secondary | ICD-10-CM

## 2019-10-17 MED ORDER — LIDOCAINE HCL URETHRAL/MUCOSAL 2 % EX GEL
1.0000 "application " | Freq: Once | CUTANEOUS | Status: AC
  Administered 2019-10-17: 1 via URETHRAL

## 2019-10-17 NOTE — Telephone Encounter (Signed)
Pt is needing an appt for lft hand carbinal tunnel with his had being very swollen. Can not grip anything. Wants to go to the same one that dr lada had talked with him about.

## 2019-10-17 NOTE — Progress Notes (Signed)
   10/17/19  CC:  Chief Complaint  Patient presents with  . Cysto    HPI: 63 y.o. male followed by [facility] for elevated PSA and BPH.  Most recent prostate volume was approximately 80 cc.  Refer to her note of 07/28/2019.  PVR was 171 mL.  Prior TURP 2013  Blood pressure (!) 153/84, pulse 76, height 5\' 10"  (1.778 m), weight 166 lb (75.3 kg). NED. A&Ox3.   No respiratory distress   Abd soft, NT, ND Normal phallus with bilateral descended testicles  Cystoscopy Procedure Note  Patient identification was confirmed, informed consent was obtained, and patient was prepped using Betadine solution.  Lidocaine jelly was administered per urethral meatus.     Pre-Procedure: - Inspection reveals a normal caliber ureteral meatus.  Procedure: The flexible cystoscope was introduced without difficulty - No urethral strictures/lesions are present. - Coapting lateral lobes prostate  - Moderate elevation bladder neck - Bilateral ureteral orifices identified - Bladder mucosa  reveals no ulcers, tumors, or lesions - No bladder stones -Mild trabeculation; small left inferior wall diverticulum  Retroflexion shows intravesical extension of lateral lobes   Post-Procedure: - Patient tolerated the procedure well  Assessment/ Plan: 63 y.o. male with bothersome lower urinary tract symptoms on tamsulosin and finasteride.  Elevated PVR.  He is interested in outlet procedure and feel HoLEP would be his best option.  Will schedule an appointment with Dr. Erlene Quan or Dr. Diamantina Providence for further discussion.   Abbie Sons, MD

## 2019-10-18 LAB — URINALYSIS, COMPLETE
Bilirubin, UA: NEGATIVE
Glucose, UA: NEGATIVE
Ketones, UA: NEGATIVE
Leukocytes,UA: NEGATIVE
Nitrite, UA: NEGATIVE
RBC, UA: NEGATIVE
Specific Gravity, UA: 1.025 (ref 1.005–1.030)
Urobilinogen, Ur: 1 mg/dL (ref 0.2–1.0)
pH, UA: 7 (ref 5.0–7.5)

## 2019-10-18 LAB — MICROSCOPIC EXAMINATION
Bacteria, UA: NONE SEEN
RBC, Urine: NONE SEEN /hpf (ref 0–2)

## 2019-10-18 NOTE — Telephone Encounter (Signed)
Per Delsa Grana if you will go ahead and do the referral she will sign but need to send old notes from past with [facility]

## 2019-10-18 NOTE — Telephone Encounter (Signed)
Pt needs appt

## 2019-10-18 NOTE — Telephone Encounter (Signed)
lvm to call and sch

## 2019-11-23 DIAGNOSIS — I129 Hypertensive chronic kidney disease with stage 1 through stage 4 chronic kidney disease, or unspecified chronic kidney disease: Secondary | ICD-10-CM | POA: Insufficient documentation

## 2019-11-23 DIAGNOSIS — R319 Hematuria, unspecified: Secondary | ICD-10-CM | POA: Insufficient documentation

## 2019-11-25 ENCOUNTER — Ambulatory Visit: Payer: Medicaid Other | Admitting: Family Medicine

## 2019-12-10 ENCOUNTER — Telehealth: Payer: Self-pay | Admitting: Family Medicine

## 2019-12-10 DIAGNOSIS — E559 Vitamin D deficiency, unspecified: Secondary | ICD-10-CM

## 2019-12-10 NOTE — Telephone Encounter (Signed)
Requested medication (s) are due for refill today: yes  Requested medication (s) are on the active medication list: yes  Last refill:  02/28/19  Future visit scheduled: yes  Notes to clinic:  medication not delegated to NT to refill   Requested Prescriptions  Pending Prescriptions Disp Refills   Vitamin D, Ergocalciferol, (DRISDOL) 1.25 MG (50000 UNIT) CAPS capsule [Pharmacy Med Name: VITAMIN D (ERGOCALCIFEROL) 1.25 MG] 3 capsule 0    Sig: TAKE 1 CAPSULE BY MOUTH ONCE A MONTH FOR3 MONTHS      Endocrinology:  Vitamins - Vitamin D Supplementation Failed - 12/10/2019  8:10 AM      Failed - 50,000 IU strengths are not delegated      Failed - Phosphate in normal range and within 360 days    No results found for: PHOS        Failed - Vitamin D in normal range and within 360 days    Vit D, 25-Hydroxy  Date Value Ref Range Status  11/12/2018 15 (L) 30 - 100 ng/mL Final    Comment:    Vitamin D Status         25-OH Vitamin D: . Deficiency:                    <20 ng/mL Insufficiency:             20 - 29 ng/mL Optimal:                 > or = 30 ng/mL . For 25-OH Vitamin D testing on patients on  D2-supplementation and patients for whom quantitation  of D2 and D3 fractions is required, the QuestAssureD(TM) 25-OH VIT D, (D2,D3), LC/MS/MS is recommended: order  code [phone] [id number] (patients >77yrs). . For more information on this test, go to: http://education.questdiagnostics.com/faq/FAQ163 (This link is being provided for  informational/educational purposes only.)           Passed - Ca in normal range and within 360 days    Calcium  Date Value Ref Range Status  05/25/2019 9.0 8.6 - 10.3 mg/dL Final          Passed - Valid encounter within last 12 months    Recent Outpatient Visits           6 months ago Aortic atherosclerosis [facility])   [place], NP   9 months ago Aortic atherosclerosis [facility])   [place], Satira Anis, MD   9 months ago No-show for appointment   [facility] Lada, Satira Anis, MD   9 months ago No-show for appointment   [facility] Lada, Satira Anis, MD   1 year ago Nontraumatic complete tear of left rotator cuff   [place], Satira Anis, MD       Future Appointments             In 2 weeks Delsa Grana, PA-C [facility], [facility]

## 2019-12-12 NOTE — Telephone Encounter (Signed)
Pt has an appt on 12/27/2019

## 2019-12-15 ENCOUNTER — Telehealth: Payer: Self-pay | Admitting: *Deleted

## 2019-12-15 DIAGNOSIS — Z87891 Personal history of nicotine dependence: Secondary | ICD-10-CM

## 2019-12-15 NOTE — Telephone Encounter (Signed)
Patient has been notified that annual lung cancer screening low dose CT scan is due currently or will be in near future. Confirmed that patient is within the age range of 55-77, and asymptomatic, (no signs or symptoms of lung cancer). Patient denies illness that would prevent curative treatment for lung cancer if found. Verified smoking history, (current, 55 pack year). The shared decision making visit was done 12/21/15. Patient is agreeable for CT scan being scheduled.

## 2019-12-27 ENCOUNTER — Ambulatory Visit: Payer: Medicaid Other | Admitting: Family Medicine

## 2019-12-27 ENCOUNTER — Other Ambulatory Visit: Payer: Self-pay

## 2019-12-27 ENCOUNTER — Encounter: Payer: Self-pay | Admitting: Family Medicine

## 2019-12-27 VITALS — BP 138/80 | HR 103 | Temp 98.8°F | Resp 14 | Ht 70.0 in | Wt 171.3 lb

## 2019-12-27 DIAGNOSIS — E559 Vitamin D deficiency, unspecified: Secondary | ICD-10-CM | POA: Diagnosis not present

## 2019-12-27 DIAGNOSIS — N3943 Post-void dribbling: Secondary | ICD-10-CM

## 2019-12-27 DIAGNOSIS — M542 Cervicalgia: Secondary | ICD-10-CM | POA: Diagnosis not present

## 2019-12-27 DIAGNOSIS — Z5181 Encounter for therapeutic drug level monitoring: Secondary | ICD-10-CM

## 2019-12-27 DIAGNOSIS — F17200 Nicotine dependence, unspecified, uncomplicated: Secondary | ICD-10-CM

## 2019-12-27 DIAGNOSIS — N401 Enlarged prostate with lower urinary tract symptoms: Secondary | ICD-10-CM

## 2019-12-27 DIAGNOSIS — D573 Sickle-cell trait: Secondary | ICD-10-CM

## 2019-12-27 DIAGNOSIS — N183 Chronic kidney disease, stage 3 unspecified: Secondary | ICD-10-CM

## 2019-12-27 DIAGNOSIS — F5101 Primary insomnia: Secondary | ICD-10-CM

## 2019-12-27 DIAGNOSIS — F339 Major depressive disorder, recurrent, unspecified: Secondary | ICD-10-CM

## 2019-12-27 DIAGNOSIS — E782 Mixed hyperlipidemia: Secondary | ICD-10-CM | POA: Diagnosis not present

## 2019-12-27 DIAGNOSIS — F17210 Nicotine dependence, cigarettes, uncomplicated: Secondary | ICD-10-CM

## 2019-12-27 DIAGNOSIS — I700 Atherosclerosis of aorta: Secondary | ICD-10-CM

## 2019-12-27 DIAGNOSIS — J439 Emphysema, unspecified: Secondary | ICD-10-CM

## 2019-12-27 DIAGNOSIS — E538 Deficiency of other specified B group vitamins: Secondary | ICD-10-CM

## 2019-12-27 LAB — CBC WITH DIFFERENTIAL/PLATELET
Absolute Monocytes: 439 cells/uL (ref 200–950)
Basophils Absolute: 40 cells/uL (ref 0–200)
Basophils Relative: 0.7 %
Eosinophils Absolute: 80 cells/uL (ref 15–500)
Eosinophils Relative: 1.4 %
HCT: 41.3 % (ref 38.5–50.0)
Hemoglobin: 13.4 g/dL (ref 13.2–17.1)
Lymphs Abs: 1904 cells/uL (ref 850–3900)
MCH: 24.5 pg — ABNORMAL LOW (ref 27.0–33.0)
MCHC: 32.4 g/dL (ref 32.0–36.0)
MCV: 75.6 fL — ABNORMAL LOW (ref 80.0–100.0)
MPV: 8.9 fL (ref 7.5–12.5)
Monocytes Relative: 7.7 %
Neutro Abs: 3238 cells/uL (ref 1500–7800)
Neutrophils Relative %: 56.8 %
Platelets: 259 10*3/uL (ref 140–400)
RBC: 5.46 10*6/uL (ref 4.20–5.80)
RDW: 14.4 % (ref 11.0–15.0)
Total Lymphocyte: 33.4 %
WBC: 5.7 10*3/uL (ref 3.8–10.8)

## 2019-12-27 LAB — COMPLETE METABOLIC PANEL WITH GFR
AG Ratio: 1.4 (calc) (ref 1.0–2.5)
ALT: 9 U/L (ref 9–46)
AST: 14 U/L (ref 10–35)
Albumin: 4 g/dL (ref 3.6–5.1)
Alkaline phosphatase (APISO): 61 U/L (ref 35–144)
BUN/Creatinine Ratio: 7 (calc) (ref 6–22)
BUN: 10 mg/dL (ref 7–25)
CO2: 32 mmol/L (ref 20–32)
Calcium: 9.3 mg/dL (ref 8.6–10.3)
Chloride: 103 mmol/L (ref 98–110)
Creat: 1.43 mg/dL — ABNORMAL HIGH (ref 0.70–1.25)
GFR, Est African American: 61 mL/min/{1.73_m2} (ref >=60)
GFR, Est Non African American: 52 mL/min/{1.73_m2} — ABNORMAL LOW (ref >=60)
Globulin: 2.8 g/dL (calc) (ref 1.9–3.7)
Glucose, Bld: 74 mg/dL (ref 65–99)
Potassium: 4.2 mmol/L (ref 3.5–5.3)
Sodium: 141 mmol/L (ref 135–146)
Total Bilirubin: 0.4 mg/dL (ref 0.2–1.2)
Total Protein: 6.8 g/dL (ref 6.1–8.1)

## 2019-12-27 LAB — VITAMIN D 25 HYDROXY (VIT D DEFICIENCY, FRACTURES): Vit D, 25-Hydroxy: 21 ng/mL — ABNORMAL LOW (ref 30–100)

## 2019-12-27 LAB — VITAMIN B12: Vitamin B-12: 366 pg/mL (ref 200–1100)

## 2019-12-27 LAB — LIPID PANEL
Cholesterol: 247 mg/dL — ABNORMAL HIGH (ref <200)
HDL: 53 mg/dL (ref >=40)
LDL Cholesterol (Calc): 167 mg/dL (calc) — ABNORMAL HIGH
Non-HDL Cholesterol (Calc): 194 mg/dL (calc) — ABNORMAL HIGH (ref <130)
Total CHOL/HDL Ratio: 4.7 (calc) (ref <5.0)
Triglycerides: 133 mg/dL (ref <150)

## 2019-12-27 MED ORDER — ATORVASTATIN CALCIUM 80 MG PO TABS: 80.0000 mg | ORAL_TABLET | Freq: Every day | ORAL | 1 refills | Status: DC

## 2019-12-27 MED ORDER — EZETIMIBE 10 MG PO TABS: 10.0000 mg | ORAL_TABLET | Freq: Every day | ORAL | 1 refills | Status: DC

## 2019-12-27 MED ORDER — TRAZODONE HCL 50 MG PO TABS: 25.0000 mg | ORAL_TABLET | Freq: Every evening | ORAL | 3 refills | Status: DC | PRN

## 2019-12-27 NOTE — Progress Notes (Signed)
Name: Manuel [name]   MRN: [id number]    DOB: 1956/02/17   Date:12/27/2019       Progress Note  Chief Complaint  Patient presents with  . Follow-up  . Hyperlipidemia     Subjective:   Manuel [name] is a 64 y.o. male, presents to clinic for routine follow up on the conditions listed above.  Hx of emphysema - needs pulmonology per pt request, he would like to see who his wife is seeing.  (I believe [place] pulmonology)  Current smoker, pipe and cigarettes about 1/2 PPD currently, started smoking when he was 64 y/o, on average many years of 1-2 PPD, has been trying to cut back with his wife,  Doing low dose CT for lung CA screening tomorrow Smoking cessation instruction/counseling given:  counseled patient on the dangers of tobacco use, advised patient to stop smoking, and reviewed strategies to maximize success Patient's last CT lung cancer screening was 1 year ago evidence of central lobar and paraseptal emphysema, left upper lobe lung nodule, evidence of atherosclerosis and calcifications to LAD  Neck pain, wants return to specialist locally, most of his care in the past was in [place] and over the past year they have been trying to reestablish care more locally - spine clinic Dr. Gwyndolyn Saxon in [place], would like to go to local spine specialist - states he has DDD with intermittent shooting of back to his midline neck, no radiation to arm, denies HA's vision changes, upper extremity weakness or numbness.  Hyperlipidemia: Current Medication Regimen:  On lipitor and zetia, compliant with medications without any concerns or side effects Last Lipids: Lab Results  Component Value Date   CHOL 247 (H) 12/27/2019   HDL 53 12/27/2019   LDLCALC 167 (H) 12/27/2019   TRIG 133 12/27/2019   CHOLHDL 4.7 12/27/2019  - Denies: Chest pain, shortness of breath, myalgias. - Documented aortic atherosclerosis? Yes - Risk factors for atherosclerosis: hypercholesterolemia, hypertension and  smoking  Insomnia well controlled on trazodone   Patient has history of depression states his moods are well controlled  History of vitamin D deficiency and B12 deficiency, on supplements  Has history of enlarged prostate does see urology and is on Flomax, finasteride and sildenafil   Patient Active Problem List   Diagnosis Date Noted  . Chronic kidney disease, stage III (moderate) 11/12/2018  . Recurrent depression ([place]) 07/15/2018  . Bilateral occipital neuralgia 05/17/2018  . Chronic tension-type headache, intractable 05/17/2018  . Myofascial pain syndrome 05/17/2018  . Chronic daily headache 04/12/2018  . Coronary artery disease 01/11/2018  . Aortic atherosclerosis ([place]) 01/11/2018  . Emphysema lung ([place]) 01/11/2018  . Mass of left thigh 12/30/2017  . 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 Vitamin D deficiency 01/14/2016  . Vitamin B12 deficiency 01/14/2016  . Benign neoplasm of cecum   . Benign neoplasm of descending colon   . Renal lesion 11/13/2015  . History of hematuria 11/13/2015  . Chronic lower back pain 11/09/2015  . BPH (benign prostatic hyperplasia) 11/01/2015  . Elevated PSA 11/01/2015  . Hyperlipidemia 11/01/2015  . Bunion, left foot 11/01/2015  . Sickle cell trait ([place]) 01/16/2015  . Cervical spondylosis without myelopathy 01/09/2015  . Carpal tunnel syndrome 01/09/2015  . Complete rotator cuff rupture of left shoulder 09/18/2014  . Rotator cuff syndrome of left shoulder 09/18/2014  . LVH (left ventricular hypertrophy) 08/18/2014  . Current smoker 10/03/2013    Past Surgical History:  Procedure Laterality Date  . CARPAL TUNNEL RELEASE  Left   . COLONOSCOPY WITH PROPOFOL N/A 12/25/2015   Procedure: COLONOSCOPY WITH PROPOFOL;  Surgeon: Lucilla Lame, MD;  Location: [facility] ENDOSCOPY;  Service: Endoscopy;  Laterality: N/A;  . CYSTOURETHROSCOPY  08/25/12   with ureteral cathization w/wo retrograde pyelogram  . ROTATOR CUFF REPAIR Right 01/25/15  .  SHOULDER ARTHROSCOPY WITH SUBACROMIAL DECOMPRESSION Right [id number]  . TRANSURETHRAL RESECTION OF PROSTATE  08/25/12    Family History  Problem Relation Age of Onset  . Aneurysm Mother   . Hypertension Mother   . Diabetes Father   . Hypertension Father   . Prostate cancer Father   . Lupus Father   . Cancer Maternal Aunt   . Cancer Maternal Uncle   . Diabetes Paternal Uncle   . Heart disease Cousin   . Prostate cancer Paternal Uncle   . COPD Neg Hx   . Stroke Neg Hx   . Kidney disease Neg Hx     Social History   Tobacco Use  . Smoking status: Current Every Day Smoker    Packs/day: 0.50    Years: 35.00    Pack years: 17.50    Types: Cigarettes  . Smokeless tobacco: Never Used  Substance Use Topics  . Alcohol use: No  . 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  atorvastatin (LIPITOR) 80 MG tablet, Take 1 tablet (80 mg total) by mouth at bedtime. For cholesterol, Disp: 90 tablet, Rfl: 1 .  diazepam (VALIUM) 10 MG tablet, Take 10-20 mg by mouth daily., Disp: , Rfl:  .  ezetimibe (ZETIA) 10 MG tablet, Take 1 tablet (10 mg total) by mouth daily., Disp: 90 tablet, Rfl: 1 .  finasteride (PROSCAR) 5 MG tablet, Take 1 tablet (5 mg total) by mouth daily., Disp: 90 tablet, Rfl: 3 .  sildenafil (REVATIO) 20 MG tablet, Take 3 to 5 tablets two hours before intercouse on an empty stomach.  Do not take with nitrates., Disp: 50 tablet, Rfl: 3 .  tamsulosin (FLOMAX) 0.4 MG CAPS capsule, Take 1 capsule (0.4 mg total) by mouth daily., Disp: 90 capsule, Rfl: 3 .  traZODone (DESYREL) 50 MG tablet, Take 0.5 tablets (25 mg total) by mouth at bedtime as needed for sleep., Disp: 30 tablet, Rfl: 3 .  vitamin B-12 (CYANOCOBALAMIN) 500 MCG tablet, Take 1 tablet (500 mcg total) by mouth daily., Disp: 30 tablet, Rfl: 11 .  Vitamin D, Ergocalciferol, (DRISDOL) 1.25 MG (50000 UT) CAPS capsule, Take 1 capsule once a month for 3 months., Disp: 3 capsule, Rfl:  0 .  aspirin EC 81 MG tablet, Take 1 tablet (81 mg total) by mouth daily. (Patient not taking: Reported on 12/27/2019), Disp: , Rfl:   Current Facility-Administered Medications:  .  cyanocobalamin ((VITAMIN B-12)) injection 1,000 mcg, 1,000 mcg, Intramuscular, Q30 days, Lada, Satira Anis, MD  Allergies  Allergen Reactions  . Penicillin G Other (See Comments)    Chart Review Today: I personally reviewed active problem list, medication list, allergies, family history, social history, health maintenance, notes from last encounter, lab results, imaging with the patient/caregiver today.   Review of Systems  10 Systems reviewed and are negative for acute change except as noted in the HPI.  Objective:    Vitals:   12/27/19 1443  BP: 138/80  Pulse: (!) 103  Resp: 14  Temp: 98.8 F (37.1 C)  SpO2: 96%  Weight: 171 lb 4.8 oz (77.7 kg)  Height: 5'  10" (1.778 m)    Body mass index is 24.58 kg/m.  Physical Exam Vitals and nursing note reviewed.  Constitutional:      General: He is not in acute distress.    Appearance: Normal appearance. He is well-developed. He is not ill-appearing, toxic-appearing or diaphoretic.     Interventions: Face mask in place.  HENT:     Head: Normocephalic and atraumatic.     Jaw: No trismus.     Right Ear: External ear normal.     Left Ear: External ear normal.  Eyes:     General: Lids are normal. No scleral icterus.    Conjunctiva/sclera: Conjunctivae normal.     Pupils: Pupils are equal, round, and reactive to light.  Neck:     Trachea: Trachea and phonation normal. No tracheal deviation.  Cardiovascular:     Rate and Rhythm: Normal rate and regular rhythm.     Pulses: Normal pulses.          Radial pulses are 2+ on the right side and 2+ on the left side.       Posterior tibial pulses are 2+ on the right side and 2+ on the left side.     Heart sounds: Normal heart sounds. No murmur. No friction rub. No gallop.   Pulmonary:     Effort:  Pulmonary effort is normal. No respiratory distress.     Breath sounds: Normal breath sounds. No stridor. No wheezing, rhonchi or rales.  Abdominal:     General: Bowel sounds are normal. There is no distension.     Palpations: Abdomen is soft.     Tenderness: There is no abdominal tenderness. There is no guarding or rebound.  Musculoskeletal:        General: Normal range of motion.     Cervical back: Normal range of motion and neck supple. No edema, rigidity or crepitus. No pain with movement, spinous process tenderness or muscular tenderness. Normal range of motion.     Right lower leg: No edema.     Left lower leg: No edema.     Comments: Left hand and wrist in bandages after recent carpal tunnel surgery  Skin:    General: Skin is warm and dry.     Capillary Refill: Capillary refill takes less than 2 seconds.     Coloration: Skin is not jaundiced or pale.     Findings: No rash.     Nails: There is no clubbing.  Neurological:     Mental Status: He is alert.     Cranial Nerves: No dysarthria or facial asymmetry.     Motor: No tremor or abnormal muscle tone.     Gait: Gait normal.  Psychiatric:        Mood and Affect: Mood normal.        Speech: Speech normal.        Behavior: Behavior normal. Behavior is cooperative.      PHQ2/9: Depression screen [facility] 2/9 12/27/2019 05/25/2019 02/15/2019 11/12/2018 07/15/2018  Decreased Interest 0 0 0 0 0  Down, Depressed, Hopeless 0 0 0 0 2  PHQ - 2 Score 0 0 0 0 2  Altered sleeping 0 0 0 0 3  Tired, decreased energy 0 0 0 0 2  Change in appetite 0 0 0 0 1  Feeling bad or failure about yourself  0 0 0 0 0  Trouble concentrating 0 0 0 0 1  Moving slowly or fidgety/restless 0 0 0 0 0  Suicidal thoughts 0 0 0 0 0  PHQ-9 Score 0 0 0 0 9  Difficult doing work/chores Not difficult at all Not difficult at all Not difficult at all Not difficult at all Not difficult at all  Some recent data might be hidden    phq 9 is neg, reviewed today  Fall  Risk: Fall Risk  12/27/2019 05/25/2019 02/17/2019 02/15/2019 11/12/2018  Falls in the past year? 0 0 0 0 0  Number falls in past yr: 0 0 - 0 0  Injury with Fall? 0 0 - 0 0    Functional Status Survey: Is the patient deaf or have difficulty hearing?: No Does the patient have difficulty seeing, even when wearing glasses/contacts?: No Does the patient have difficulty concentrating, remembering, or making decisions?: No Does the patient have difficulty walking or climbing stairs?: No Does the patient have difficulty dressing or bathing?: No Does the patient have difficulty doing errands alone such as visiting a doctor's office or shopping?: No   Assessment & Plan:     ICD-10-CM   1. Mixed hyperlipidemia  E78.2 atorvastatin (LIPITOR) 80 MG tablet    ezetimibe (ZETIA) 10 MG tablet    COMPLETE METABOLIC PANEL WITH GFR    Lipid panel   last labs very high and poorly controlled, states he is on meds, compliant, no SE/myalgias/jaundice, repeat labs  2. Primary insomnia  F51.01 traZODone (DESYREL) 50 MG tablet   Well-controlled with trazodone med refill today  3. Neck pain  M54.2 Ambulatory referral to Spine Surgery   Patient reports history of chronic neck pain and degenerative disc disease wants to establish with specialist locally previously saw spine specialist in [facility]  4. Vitamin D deficiency  E55.9 VITAMIN D 25 Hydroxy (Vit-D Deficiency, Fractures)   On supplement, recheck labs  5. Vitamin B12 deficiency  E53.8 CBC with Differential/Platelet    Vitamin B12   On supplements, recheck CBC and B12  6. Aortic atherosclerosis (HCC)  I70.0 atorvastatin (LIPITOR) 80 MG tablet    ezetimibe (ZETIA) 10 MG tablet    Lipid panel   On statin, monitoring encourage smoking cessation  7. Pulmonary emphysema, unspecified emphysema type ([place])  J43.9 Ambulatory referral to Pulmonology   Known emphysema, lung nodules, history of heavy smoker, would like to establish with pulmonology  8. Sickle cell trait  (HCC)  D57.3    Recheck CBC no past complications  9. Benign prostatic hyperplasia with post-void dribbling  N40.1    N39.43    Per urology well-controlled with current medications  10. Stage 3 chronic kidney disease, unspecified whether stage 3a or 3b CKD  [id number] COMPLETE METABOLIC PANEL WITH GFR   Recheck renal function  11. Current smoker  F17.200 Ambulatory referral to Pulmonology   Discussed [place] cessation at length today  12. Smoking greater than 30 pack years  F17.210    Doing monitoring for lung cancer, known lung nodules, established with pulmonology  13. Encounter for medication monitoring  Z51.81 CBC with Differential/Platelet    COMPLETE METABOLIC PANEL WITH GFR    Lipid panel    VITAMIN D 25 Hydroxy (Vit-D Deficiency, Fractures)    Vitamin B12  14. Recurrent depression ([place])  F33.9    currently in remission/well controlled, PHQ neg, moods good, only on trazodone     Return in about 3 months (around 03/25/2020) for Routine follow-up.   Delsa Grana, PA-C 12/27/19 3:47 PM

## 2019-12-28 ENCOUNTER — Encounter: Payer: Self-pay | Admitting: Family Medicine

## 2019-12-29 ENCOUNTER — Ambulatory Visit: Admission: RE | Admit: 2019-12-29 | Payer: Medicaid Other | Source: Ambulatory Visit

## 2020-01-02 ENCOUNTER — Other Ambulatory Visit: Payer: Self-pay

## 2020-01-02 ENCOUNTER — Ambulatory Visit
Admission: RE | Admit: 2020-01-02 | Discharge: 2020-01-02 | Disposition: A | Discharge status: Home / Self Care | Payer: Medicaid Other | Source: Ambulatory Visit | Attending: Oncology | Admitting: Oncology

## 2020-01-02 DIAGNOSIS — Z87891 Personal history of nicotine dependence: Secondary | ICD-10-CM | POA: Insufficient documentation

## 2020-01-02 DIAGNOSIS — N138 Other obstructive and reflux uropathy: Secondary | ICD-10-CM

## 2020-01-03 ENCOUNTER — Ambulatory Visit (INDEPENDENT_AMBULATORY_CARE_PROVIDER_SITE_OTHER): Payer: Medicaid Other | Admitting: Urology

## 2020-01-03 ENCOUNTER — Other Ambulatory Visit
Admission: RE | Admit: 2020-01-03 | Discharge: 2020-01-03 | Disposition: A | Discharge status: Home / Self Care | Payer: Medicaid Other | Attending: Urology | Admitting: Urology

## 2020-01-03 ENCOUNTER — Other Ambulatory Visit: Payer: Self-pay

## 2020-01-03 ENCOUNTER — Encounter: Payer: Self-pay | Admitting: Urology

## 2020-01-03 VITALS — BP 130/82 | HR 62 | Ht 70.0 in | Wt 171.0 lb

## 2020-01-03 DIAGNOSIS — N401 Enlarged prostate with lower urinary tract symptoms: Secondary | ICD-10-CM | POA: Diagnosis present

## 2020-01-03 DIAGNOSIS — N138 Other obstructive and reflux uropathy: Secondary | ICD-10-CM | POA: Insufficient documentation

## 2020-01-03 LAB — URINALYSIS, COMPLETE (UACMP) WITH MICROSCOPIC
Bacteria, UA: NONE SEEN
Bilirubin Urine: NEGATIVE
Glucose, UA: NEGATIVE mg/dL
Hgb urine dipstick: NEGATIVE
Ketones, ur: NEGATIVE mg/dL
Leukocytes,Ua: NEGATIVE
Nitrite: NEGATIVE
Protein, ur: NEGATIVE mg/dL
RBC / HPF: NONE SEEN RBC/hpf (ref 0–5)
Specific Gravity, Urine: 1.015 (ref 1.005–1.030)
pH: 6.5 (ref 5.0–8.0)

## 2020-01-03 LAB — BLADDER SCAN AMB NON-IMAGING

## 2020-01-03 NOTE — Progress Notes (Signed)
   01/03/2020 1:51 PM   Manuel [name] [birth year] [id number]  Reason for visit: Discuss HoLEP  HPI: I saw Mr. Manuel [name] today urology clinic for discussion of HoLEP.  He is a 64 year old male previously followed by Ellwood Handler and Dr. Bernardo Heater.  His urologic history is notable for hematuria and clot retention in 2013 that was managed at [facility] with an emergent TUR of a large median lobe.  Per the operative report, the lateral lobes were not resected at that time.  He has been followed by Zara Council long-term in our clinic for longstanding history of BPH on maximal medical therapy with Flomax and finasteride, as well as an elevated PSA.  He recently was found to have a PSA of 5.6, corrected for finasteride 11.2, and underwent a prostate MRI on 07/25/2019 that showed an 80 g prostate with a PI-RADS 4 lesion in the peripheral zone of the apex.  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  He denies any recurrent gross hematuria or UTIs.  He recently underwent cystoscopy with Dr. Bernardo Heater for evaluation of his BPH and urinary symptoms which showed large obstructing lateral lobes and a high bladder neck but no suspicious bladder lesions or bladder stones.  He continues to have bothersome urinary symptoms of weak stream, dribbling, intermittency, incomplete emptying(PVR today to 215 mL), urgency/frequency, and urge incontinence.  IPSS score today is 20/35 indicating severe symptoms.  We discussed options for his BPH and urinary symptoms at length including ongoing maximal medical management with Flomax and finasteride, versus an outlet procedure with HoLEP.  I think with his persistently elevated PVRs greater than 200 mL and ongoing urinary symptoms, he would benefit significantly from an outlet procedure.  We discussed the risks and benefits of HoLEP at length.  The procedure requires general anesthesia and  takes 2 to 3 hours, and a holmium laser is used to enucleate the prostate and push this tissue into the bladder.  A morcellator is then used to remove this tissue, which is sent for pathology.  Majority of patients are able to discharge the same day with a catheter in place for 2 to 3 days, and will follow-up in clinic for a voiding trial.  Approximately 5% of patients will be admitted overnight to monitor the urine, or if they have multiple co-morbidities.  We specifically discussed the risks of bleeding, infection, retrograde ejaculation, temporary urgency and urge incontinence, very low risk of long-term incontinence, pathologic evaluation of prostate tissue and possible detection of prostate cancer or other malignancy, and possible need for additional procedures.  Schedule HOLEP  I spent 30 total minutes on the day of the encounter including pre-visit review of the medical record, face-to-face time with the patient, and post visit ordering of labs/imaging/tests.  Billey Co, [facility] [address], [place],  [address] [phone]

## 2020-01-03 NOTE — Patient Instructions (Signed)

## 2020-01-04 ENCOUNTER — Encounter: Payer: Self-pay | Admitting: *Deleted

## 2020-01-31 ENCOUNTER — Telehealth: Payer: Self-pay | Admitting: Urology

## 2020-01-31 NOTE — Telephone Encounter (Signed)
I have tried to reach pt. Several times to set up HoLEP procedure with Dr. Diamantina Providence. I have left messages for pt. To call office or my direct number and have not gotten a response.

## 2020-02-27 ENCOUNTER — Institutional Professional Consult (permissible substitution): Payer: Medicaid Other | Admitting: Pulmonary Disease

## 2020-04-04 ENCOUNTER — Encounter: Payer: Self-pay | Admitting: Urology

## 2020-05-08 ENCOUNTER — Other Ambulatory Visit: Payer: Self-pay | Admitting: Urology

## 2020-05-08 DIAGNOSIS — N401 Enlarged prostate with lower urinary tract symptoms: Secondary | ICD-10-CM

## 2020-05-15 ENCOUNTER — Other Ambulatory Visit: Payer: Self-pay

## 2020-05-15 ENCOUNTER — Ambulatory Visit (INDEPENDENT_AMBULATORY_CARE_PROVIDER_SITE_OTHER): Payer: Medicaid Other | Admitting: Urology

## 2020-05-15 ENCOUNTER — Encounter: Payer: Self-pay | Admitting: Urology

## 2020-05-15 VITALS — BP 128/82 | HR 76 | Ht 70.0 in | Wt 165.0 lb

## 2020-05-15 DIAGNOSIS — N401 Enlarged prostate with lower urinary tract symptoms: Secondary | ICD-10-CM

## 2020-05-15 DIAGNOSIS — N138 Other obstructive and reflux uropathy: Secondary | ICD-10-CM

## 2020-05-15 DIAGNOSIS — R972 Elevated prostate specific antigen [PSA]: Secondary | ICD-10-CM

## 2020-05-15 LAB — BLADDER SCAN AMB NON-IMAGING: Scan Result: 96

## 2020-05-15 MED ORDER — FINASTERIDE 5 MG PO TABS: 5.0000 mg | ORAL_TABLET | Freq: Every day | ORAL | 3 refills | Status: DC

## 2020-05-15 MED ORDER — SILDENAFIL CITRATE 20 MG PO TABS: ORAL_TABLET | ORAL | 3 refills | Status: DC

## 2020-05-15 NOTE — Progress Notes (Signed)
05/15/2020  11:39 PM   Manuel [name] [birth year] [id number]  Referring provider: Delsa Grana, PA-C [address] [place],  [place] [address]  Chief Complaint  Patient presents with  . Elevated PSA    HPI: Manuel [name] is a 64 y.o.  male with an elevated PSA, BPH with LUTS and abnormal prostate exam who presents today prostate problems.  Elevated PSA Benign prostate biopsy in 10/2016 - PSA 6.1 His current PSA is 5.6 (11.2)  Prostate MRI on 07/25/2019 revealed a small PI-RADS category 4 lesion in the right posterolateral peripheral zone of the prostate apex is present. Targeting data sent to [place].  Considerable benign prostatic hypertrophy, prostate volume 78.53 cubic cm.  Thin peripheral zone with generalized low T2 signal, probably postinflammatory.  Sigmoid colon diverticulosis. 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  Patient denies any modifying or aggravating factors.  Patient denies any gross hematuria, dysuria or suprapubic/flank pain.  Patient denies any fevers, chills, nausea or vomiting.   Currently taking: tamsulosin 0.4 mg daily and finasteride 5 mg daily  His has had TURP in 2013.  Recently recommended to undergo HoLEP, but he deferred due to wife's health issues   ? Paternal uncle with prostate cancer   UA unremarkable   IPSS    Row Name 05/15/20 1500         International Prostate Symptom Score   How often have you had the sensation of not emptying your bladder? About half the time     How often have you had to urinate less than every two hours? About half the time     How often have you found you stopped and started again several times when you urinated? About half the time     How often have you found it difficult to postpone urination? About half the time     How often have you had a weak  urinary stream? About half the time     How often have you had to strain to start urination? Less than half the time     How many times did you typically get up at night to urinate? 2 Times     Total IPSS Score 19       Quality of Life due to urinary symptoms   If you were to spend the rest of your life with your urinary condition just the way it is now how would you feel about that? Mixed            Score:  1-7 Mild 8-19 Moderate 20-35 Severe  PMH: Past Medical History:  Diagnosis Date  . Anemia   . Anxiety   . Aortic atherosclerosis ([place]) 01/11/2018   CT scan Feb 2019  . Asthma   . Bilateral carpal tunnel syndrome   . Bilateral hand numbness   . BPH without urinary obstruction   . Cervical spondylosis without myelopathy   . Complete tear of rotator cuff    bilateral  . COPD (chronic obstructive pulmonary disease) ([place])   . Depression   . Emphysema lung ([place]) 01/11/2018   Chest CT Feb 2019  . History of stomach ulcers   . HLD (hyperlipidemia)   . LVH (left ventricular hypertrophy)   . Personal history of tobacco use, presenting hazards to health 12/21/2015  .  Poor dentition   . Sickle cell trait ([place])   . Sleep apnea   . Smoker     Surgical History: Past Surgical History:  Procedure Laterality Date  . CARPAL TUNNEL RELEASE Left   . COLONOSCOPY WITH PROPOFOL N/A 12/25/2015   Procedure: COLONOSCOPY WITH PROPOFOL;  Surgeon: Lucilla Lame, MD;  Location: [facility] ENDOSCOPY;  Service: Endoscopy;  Laterality: N/A;  . CYSTOURETHROSCOPY  08/25/12   with ureteral cathization w/wo retrograde pyelogram  . ROTATOR CUFF REPAIR Right 01/25/15  . SHOULDER ARTHROSCOPY WITH SUBACROMIAL DECOMPRESSION Right 032416  . TRANSURETHRAL RESECTION OF PROSTATE  08/25/12    Home Medications:  Allergies as of 05/15/2020      Reactions   Penicillin G Other (See Comments)      Medication List       Accurate as of May 15, 2020 11:39 PM. If you have any questions, ask your nurse or doctor.         aspirin EC 81 MG tablet Take 1 tablet (81 mg total) by mouth daily.   atorvastatin 80 MG tablet Commonly known as: LIPITOR Take 1 tablet (80 mg total) by mouth at bedtime. For cholesterol   buPROPion 100 MG tablet Commonly known as: WELLBUTRIN Take by mouth.   diazepam 10 MG tablet Commonly known as: VALIUM Take 10-20 mg by mouth daily.   ezetimibe 10 MG tablet Commonly known as: Zetia Take 1 tablet (10 mg total) by mouth daily.   finasteride 5 MG tablet Commonly known as: PROSCAR Take 1 tablet (5 mg total) by mouth daily.   sildenafil 20 MG tablet Commonly known as: REVATIO Take 3 to 5 tablets two hours before intercouse on an empty stomach.  Do not take with nitrates.   tamsulosin 0.4 MG Caps capsule Commonly known as: FLOMAX TAKE 1 CAPSULE BY MOUTH ONCE DAILY   traZODone 50 MG tablet Commonly known as: DESYREL Take 0.5 tablets (25 mg total) by mouth at bedtime as needed for sleep.   vitamin B-12 500 MCG tablet Commonly known as: CYANOCOBALAMIN Take 1 tablet (500 mcg total) by mouth daily.   Vitamin D (Ergocalciferol) 1.25 MG (50000 UNIT) Caps capsule Commonly known as: DRISDOL Take 1 capsule once a month for 3 months.       Allergies:  Allergies  Allergen Reactions  . Penicillin G Other (See Comments)    Family History: Family History  Problem Relation Age of Onset  . Aneurysm Mother   . Hypertension Mother   . Diabetes Father   . Hypertension Father   . Prostate cancer Father   . Lupus Father   . Cancer Maternal Aunt   . Cancer Maternal Uncle   . Diabetes Paternal Uncle   . Heart disease Cousin   . Prostate cancer Paternal Uncle   . COPD Neg Hx   . Stroke Neg Hx   . Kidney disease Neg Hx     Social History:  reports that he has been smoking cigarettes. He has a 17.50 pack-year smoking history. He has never used smokeless tobacco. He reports that he does not drink alcohol and does not use drugs.  ROS: For pertinent review of systems  please refer to history of present illness  Physical Exam: BP 128/82   Pulse 76   Ht 5\' 10"  (1.778 m)   Wt 165 lb (74.8 kg)   BMI 23.68 kg/m   Constitutional:  Well nourished. Alert and oriented, No acute distress. HEENT: [place] AT, mask in place.  Trachea midline Cardiovascular:  No clubbing, cyanosis, or edema. Respiratory: Normal respiratory effort, no increased work of breathing. GI: Abdomen is soft, non tender, non distended, no abdominal masses. Liver and spleen not palpable.  No hernias appreciated.  Stool sample for occult testing is not indicated.   GU: No CVA tenderness.  No bladder fullness or masses.  Patient with circumcised. Foreskin easily retracted.Urethral meatus is patent.  No penile discharge. No penile lesions or rashes. Scrotum without lesions, cysts, rashes and/or edema.  Testicles are located scrotally bilaterally. No masses are appreciated in the testicles. Left and right epididymis are normal. Rectal: Patient with  normal sphincter tone. Anus and perineum without scarring or rashes. No rectal masses are appreciated. Prostate is approximately 60 + grams, could only palpate the apex and midportion of the gland, no nodules are appreciated. Seminal vesicles could not be palpated Skin: No rashes, bruises or suspicious lesions. Lymph: No  inguinal adenopathy. Neurologic: Grossly intact, no focal deficits, moving all 4 extremities. Psychiatric: Normal mood and affect.   Laboratory Data: Urinalysis Component     Latest Ref Rng & Units 05/15/2020  Specific Gravity, UA     1.005 - 1.030 1.025  pH, UA     5.0 - 7.5 5.5  Color, UA     Yellow Orange  Appearance Ur     Clear Hazy (A)  Leukocytes,UA     Negative Negative  Protein,UA     Negative/Trace 2+ (A)  Glucose, UA     Negative Negative  Ketones, UA     Negative Negative  RBC, UA     Negative Negative  Bilirubin, UA     Negative Negative  Urobilinogen, Ur     0.2 - 1.0 mg/dL 1.0  Nitrite, UA     Negative  Negative  Microscopic Examination      See below:   Component     Latest Ref Rng & Units 05/15/2020  WBC, UA     0 - 5 /hpf 6-10 (A)  RBC     0 - 2 /hpf 0-2  Epithelial Cells (non renal)     0 - 10 /hpf 0-10  Renal Epithel, UA     None seen /hpf 0-10 (A)  Casts     None seen /lpf Present (A)  Cast Type     N/A Hyaline casts  Bacteria, UA     None seen/Few None seen    Lab Results  Component Value Date   CREATININE 1.43 (H) 12/27/2019   I have reviewed the labs.  Pertinent Imaging CLINICAL DATA:  Elevated PSA level of 5.6 on Mar 29, 2019. Prior benign prostate biopsy in 2017. Urge incontinence and urgency.  EXAM: MR PROSTATE WITHOUT AND WITH CONTRAST  TECHNIQUE: Multiplanar multisequence MRI images were obtained of the pelvis centered about the prostate. Pre and post contrast images were obtained.  CONTRAST:  54mL GADAVIST GADOBUTROL 1 MMOL/ML IV SOLN  COMPARISON:  None.  FINDINGS: Prostate:  Generalized thinning of the peripheral zone with reduced T2 signal and ill definition of the peripheral zone.  Considerable benign prostatic hypertrophy.  Region of interest # 1: PI-RADS category 4 lesion in the right posterolateral peripheral zone of the prostate apex, with reduced T2 signal poorly matched on the ADC map images but with some mild asymmetric early enhancement. This lesion measures 2.0 by 0.9 by 0.6 cm with a volume of 0.58 cubic cm.  Volume: 3D volumetric analysis: Prostate volume 78.53 cubic cm (6.0 by 5.3 by 5.2 cm).  Transcapsular  spread:  Absent  Seminal vesicle involvement: Absent  Neurovascular bundle involvement: Absent  Pelvic adenopathy: Absent  Bone metastasis: Absent  Other findings: Sigmoid colon diverticulosis.  IMPRESSION: 1. A small PI-RADS category 4 lesion in the right posterolateral peripheral zone of the prostate apex is present. Targeting data sent to [place]. 2. Considerable benign prostatic  hypertrophy, prostate volume 78.53 cubic cm. 3. Thin peripheral zone with generalized low T2 signal, probably postinflammatory. 4. Sigmoid colon diverticulosis.   Electronically Signed   By: Van Clines M.D.   On: 07/25/2019 15:32 Results for RAMSAY, BOGNAR (MRN [id number]) as of 05/15/2020 23:37  Ref. Range 05/15/2020 15:02  Scan Result Unknown 96   Assessment & Plan:    1. Elevated PSA/abnormal exam Fusion biopsy with HGPIN 09/2019 PSA pending   2. BPH with LU TS I PSS 19/3 Continue conservative management, avoiding bladder irritants and timed voiding's Most bothersome symptoms is/are urge and urge incontinence Continue tamsulosin 0.4 mg daily and finasteride 5 mg daily Patient on maximal medical therapy - Pt was was reccommended HOLEP procedure at this time but deferred due to his wife's medical illness PSA is drawn today and he will return pending those results  Return for pending PSA.  Zara Council, PA-C [facility] [address], [place], [place] [address] [phone]  I, Joneen Boers Peace, am acting as a Education administrator for [employer], PA-C  I have reviewed the above documentation for accuracy and completeness, and I agree with the above.    Zara Council, PA-C

## 2020-05-16 ENCOUNTER — Telehealth: Payer: Self-pay | Admitting: Radiology

## 2020-05-16 DIAGNOSIS — N401 Enlarged prostate with lower urinary tract symptoms: Secondary | ICD-10-CM

## 2020-05-16 DIAGNOSIS — R972 Elevated prostate specific antigen [PSA]: Secondary | ICD-10-CM

## 2020-05-16 LAB — URINALYSIS, COMPLETE
Bilirubin, UA: NEGATIVE
Glucose, UA: NEGATIVE
Ketones, UA: NEGATIVE
Leukocytes,UA: NEGATIVE
Nitrite, UA: NEGATIVE
RBC, UA: NEGATIVE
Specific Gravity, UA: 1.025 (ref 1.005–1.030)
Urobilinogen, Ur: 1 mg/dL (ref 0.2–1.0)
pH, UA: 5.5 (ref 5.0–7.5)

## 2020-05-16 LAB — MICROSCOPIC EXAMINATION: Bacteria, UA: NONE SEEN

## 2020-05-16 LAB — PSA: Prostate Specific Ag, Serum: 5.2 ng/mL — ABNORMAL HIGH (ref 0.0–4.0)

## 2020-05-16 NOTE — Telephone Encounter (Signed)
-----   Message from Nori Riis, PA-C sent at 05/16/2020  8:05 AM EDT ----- Please let Mr. Memmott know that his PSA remains stable at 5.2.  We need to see him again in 6 months for I PSS, SHIM, PVR and exam with PSA prior.

## 2020-05-16 NOTE — Telephone Encounter (Signed)
LMOM to return call.

## 2020-05-17 NOTE — Telephone Encounter (Signed)
Patient notified and scheduled, order in

## 2020-06-04 ENCOUNTER — Other Ambulatory Visit: Payer: Self-pay

## 2020-06-04 ENCOUNTER — Encounter: Payer: Self-pay | Admitting: Family Medicine

## 2020-06-04 ENCOUNTER — Ambulatory Visit (INDEPENDENT_AMBULATORY_CARE_PROVIDER_SITE_OTHER): Payer: Medicare Other | Admitting: Family Medicine

## 2020-06-04 VITALS — BP 130/70 | HR 86 | Temp 97.5°F | Resp 16 | Ht 70.0 in | Wt 162.0 lb

## 2020-06-04 DIAGNOSIS — F5101 Primary insomnia: Secondary | ICD-10-CM

## 2020-06-04 DIAGNOSIS — E559 Vitamin D deficiency, unspecified: Secondary | ICD-10-CM

## 2020-06-04 DIAGNOSIS — E782 Mixed hyperlipidemia: Secondary | ICD-10-CM

## 2020-06-04 DIAGNOSIS — I700 Atherosclerosis of aorta: Secondary | ICD-10-CM | POA: Diagnosis not present

## 2020-06-04 DIAGNOSIS — Z5181 Encounter for therapeutic drug level monitoring: Secondary | ICD-10-CM

## 2020-06-04 DIAGNOSIS — E538 Deficiency of other specified B group vitamins: Secondary | ICD-10-CM

## 2020-06-04 MED ORDER — TRAZODONE HCL 50 MG PO TABS: 50.0000 mg | ORAL_TABLET | Freq: Every evening | ORAL | 3 refills | Status: DC | PRN

## 2020-06-04 MED ORDER — VITAMIN B-12 500 MCG PO TABS: 500.0000 ug | ORAL_TABLET | Freq: Every day | ORAL | 11 refills | Status: DC

## 2020-06-04 NOTE — Progress Notes (Signed)
Name: Manuel [name]   MRN: [id number]    DOB: [birth year]   Date:06/04/2020       Progress Note  Chief Complaint  Patient presents with  . Follow-up  . Hyperlipidemia  . Insomnia     Subjective:   Manuel [name] is a 64 y.o. male, presents to clinic for routine f/up and labs  Hyperlipidemia: Currently treated with lipitor 80 mg and zetia - pt is not sure what he has been taking - he says lipitor at bedtime then later says he hasn't been taking lipitor and has been taking zetia.  When wife was in ICU for about 3 weeks int he past 1-2 months he was taking nothing - not working much on diet Last Lipids: Lab Results  Component Value Date   CHOL 247 (H) 12/27/2019   HDL 53 12/27/2019   LDLCALC 167 (H) 12/27/2019   TRIG 133 12/27/2019   CHOLHDL 4.7 12/27/2019   - Denies: Chest pain, shortness of breath, myalgias, claudication   BPH -  On proscar and flomax - takes daily - sees Urology   Insomnia:   Taking trazodone every night, is working well, he is taking care of his wife and she is usually up at night ahich interferes with sleep   B12 deficiency?  Taking oral B12 Vit D deficiency - taking OTC, he doesn't know what dose     Current Outpatient Medications:  .  atorvastatin (LIPITOR) 80 MG tablet, Take 1 tablet (80 mg total) by mouth at bedtime. For cholesterol, Disp: 90 tablet, Rfl: 1 .  buPROPion (WELLBUTRIN) 100 MG tablet, Take by mouth., Disp: , Rfl:  .  diazepam (VALIUM) 10 MG tablet, Take 10-20 mg by mouth daily., Disp: , Rfl:  .  ezetimibe (ZETIA) 10 MG tablet, Take 1 tablet (10 mg total) by mouth daily., Disp: 90 tablet, Rfl: 1 .  finasteride (PROSCAR) 5 MG tablet, Take 1 tablet (5 mg total) by mouth daily., Disp: 90 tablet, Rfl: 3 .  tamsulosin (FLOMAX) 0.4 MG CAPS capsule, TAKE 1 CAPSULE BY MOUTH ONCE DAILY, Disp: 90 capsule, Rfl: 3 .  traZODone (DESYREL) 50 MG tablet, Take 0.5 tablets (25 mg total) by mouth at bedtime as needed for sleep., Disp: 30 tablet,  Rfl: 3 .  vitamin B-12 (CYANOCOBALAMIN) 500 MCG tablet, Take 1 tablet (500 mcg total) by mouth daily., Disp: 30 tablet, Rfl: 11 .  Vitamin D, Ergocalciferol, (DRISDOL) 1.25 MG (50000 UT) CAPS capsule, Take 1 capsule once a month for 3 months., Disp: 3 capsule, Rfl: 0 .  aspirin EC 81 MG tablet, Take 1 tablet (81 mg total) by mouth daily. (Patient not taking: Reported on 06/04/2020), Disp: , Rfl:  .  sildenafil (REVATIO) 20 MG tablet, Take 3 to 5 tablets two hours before intercouse on an empty stomach.  Do not take with nitrates. (Patient not taking: Reported on 06/04/2020), Disp: 50 tablet, Rfl: 3  Current Facility-Administered Medications:  .  cyanocobalamin ((VITAMIN B-12)) injection 1,000 mcg, 1,000 mcg, Intramuscular, Q30 days, Lada, Satira Anis, MD  Patient Active Problem List   Diagnosis Date Noted  . Benign hypertensive kidney disease with chronic kidney disease 11/23/2019  . Hematuria 11/23/2019  . Chronic kidney disease, stage III (moderate) 11/12/2018  . Recurrent depression ([place]) 07/15/2018  . Bilateral occipital neuralgia 05/17/2018  . Chronic tension-type headache, intractable 05/17/2018  . Myofascial pain syndrome 05/17/2018  . Chronic daily headache 04/12/2018  . Coronary artery disease 01/11/2018  . Aortic atherosclerosis ([place]) 01/11/2018  .  Emphysema lung ([place]) 01/11/2018  . Mass of left thigh 12/30/2017  . 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 Vitamin D deficiency 01/14/2016  . Vitamin B12 deficiency 01/14/2016  . Benign neoplasm of cecum   . Benign neoplasm of descending colon   . Renal lesion 11/13/2015  . History of hematuria 11/13/2015  . Chronic lower back pain 11/09/2015  . BPH (benign prostatic hyperplasia) 11/01/2015  . Elevated PSA 11/01/2015  . Hyperlipidemia 11/01/2015  . Bunion, left foot 11/01/2015  . Sickle cell trait ([place]) 01/16/2015  . Cervical spondylosis without myelopathy 01/09/2015  . Carpal tunnel syndrome 01/09/2015  . Complete rotator  cuff rupture of left shoulder 09/18/2014  . Rotator cuff syndrome of left shoulder 09/18/2014  . LVH (left ventricular hypertrophy) 08/18/2014  . Current smoker 10/03/2013    Past Surgical History:  Procedure Laterality Date  . CARPAL TUNNEL RELEASE Left   . COLONOSCOPY WITH PROPOFOL N/A 12/25/2015   Procedure: COLONOSCOPY WITH PROPOFOL;  Surgeon: Lucilla Lame, MD;  Location: [facility] ENDOSCOPY;  Service: Endoscopy;  Laterality: N/A;  . CYSTOURETHROSCOPY  08/25/12   with ureteral cathization w/wo retrograde pyelogram  . ROTATOR CUFF REPAIR Right 01/25/15  . SHOULDER ARTHROSCOPY WITH SUBACROMIAL DECOMPRESSION Right 032416  . TRANSURETHRAL RESECTION OF PROSTATE  08/25/12    Family History  Problem Relation Age of Onset  . Aneurysm Mother   . Hypertension Mother   . Diabetes Father   . Hypertension Father   . Prostate cancer Father   . Lupus Father   . Cancer Maternal Aunt   . Cancer Maternal Uncle   . Diabetes Paternal Uncle   . Heart disease Cousin   . Prostate cancer Paternal Uncle   . COPD Neg Hx   . Stroke Neg Hx   . Kidney disease Neg Hx     Social History   Tobacco Use  . Smoking status: Current Every Day Smoker    Packs/day: 0.50    Years: 35.00    Pack years: 17.50    Types: Cigarettes  . Smokeless tobacco: Never Used  . Tobacco comment: 1-2 PPD for most of his 35 yr hx smoking, > 30 packyear  Vaping Use  . Vaping Use: Never used  Substance Use Topics  . Alcohol use: No  . 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 Penicillin G Other (See Comments)    Health Maintenance  Topic Date Due  . COVID-19 Vaccine (1) Never done  . INFLUENZA VACCINE  06/03/2020  . COLONOSCOPY  12/24/2020  . TETANUS/TDAP  11/04/2023  . Hepatitis C Screening  Completed  . HIV Screening  Addressed    Chart Review Today: I personally reviewed active problem list, medication list, allergies, family history, social history, health  maintenance, notes from last encounter, lab results, imaging with the patient/caregiver today.   Review of Systems  10 Systems reviewed and are negative for acute change except as noted in the HPI.    Objective:   Vitals:   06/04/20 1440  BP: 130/70  Pulse: 86  Resp: 16  Temp: (!) 97.5 F (36.4 C)  TempSrc: Temporal  SpO2: 95%  Weight: 162 lb (73.5 kg)  Height: 5\' 10"  (1.778 m)    Body mass index is 23.24 kg/m.  Physical Exam Vitals and nursing note reviewed.  Constitutional:      General: He is not in acute distress.    Appearance: Normal appearance.  He is well-developed and normal weight. He is not ill-appearing, toxic-appearing or diaphoretic.     Interventions: Face mask in place.  HENT:     Head: Normocephalic and atraumatic.     Jaw: No trismus.     Right Ear: External ear normal.     Left Ear: External ear normal.  Eyes:     General: Lids are normal. No scleral icterus.    Conjunctiva/sclera: Conjunctivae normal.     Pupils: Pupils are equal, round, and reactive to light.  Neck:     Trachea: Trachea and phonation normal. No tracheal deviation.  Cardiovascular:     Rate and Rhythm: Normal rate and regular rhythm.     Pulses: Normal pulses.          Radial pulses are 2+ on the right side and 2+ on the left side.       Posterior tibial pulses are 2+ on the right side and 2+ on the left side.     Heart sounds: Normal heart sounds. No murmur heard.  No friction rub. No gallop.   Pulmonary:     Effort: Pulmonary effort is normal. No respiratory distress.     Breath sounds: Normal breath sounds. No stridor. No wheezing, rhonchi or rales.  Abdominal:     General: Bowel sounds are normal. There is no distension.     Palpations: Abdomen is soft.  Musculoskeletal:     Cervical back: Normal range of motion and neck supple.     Right lower leg: No edema.     Left lower leg: No edema.  Skin:    General: Skin is warm and dry.     Coloration: Skin is not  jaundiced.     Findings: No rash.     Nails: There is no clubbing.  Neurological:     Mental Status: He is alert.     Cranial Nerves: No dysarthria or facial asymmetry.     Motor: No tremor or abnormal muscle tone.     Gait: Gait normal.  Psychiatric:        Mood and Affect: Mood normal.        Speech: Speech normal.        Behavior: Behavior normal. Behavior is cooperative.         Assessment & Plan:     ICD-10-CM   1. Mixed hyperlipidemia  E78.2 Lipid panel    Hepatic function panel   not compliant with meds - unclear hx of compliance, recheck labs - consider changing to only crestor?  2. Primary insomnia  F51.01 traZODone (DESYREL) 50 MG tablet   refill on trazodone - working well, other causes of insomnia that cannot be medically managed  3. Aortic atherosclerosis (HCC)  I70.0 Lipid panel    Hepatic function panel   poor statin compliance, discussed with pt he needs to work on med compliance, monitoring, encourage smoking cessation  4. Vitamin D deficiency  E55.9    mild low Vit D, continue OTC supplement  5. Vitamin B12 deficiency  E53.8 CBC with Differential/Platelet   refills requested on supplement, screen CBC  6. Encounter for medication monitoring  Z51.81 traZODone (DESYREL) 50 MG tablet    CBC with Differential/Platelet    Lipid panel    Hepatic function panel     Return in about 4 months (around 10/04/2020) for Routine follow-up.   Delsa Grana, PA-C 06/04/20 2:58 PM

## 2020-06-05 LAB — CBC WITH DIFFERENTIAL/PLATELET
Absolute Monocytes: 452 {cells}/uL (ref 200–950)
Basophils Absolute: 21 {cells}/uL (ref 0–200)
Basophils Relative: 0.4 %
Eosinophils Absolute: 151 {cells}/uL (ref 15–500)
Eosinophils Relative: 2.9 %
HCT: 42.6 % (ref 38.5–50.0)
Hemoglobin: 13.9 g/dL (ref 13.2–17.1)
Lymphs Abs: 1888 {cells}/uL (ref 850–3900)
MCH: 24.9 pg — ABNORMAL LOW (ref 27.0–33.0)
MCHC: 32.6 g/dL (ref 32.0–36.0)
MCV: 76.2 fL — ABNORMAL LOW (ref 80.0–100.0)
MPV: 9.5 fL (ref 7.5–12.5)
Monocytes Relative: 8.7 %
Neutro Abs: 2688 {cells}/uL (ref 1500–7800)
Neutrophils Relative %: 51.7 %
Platelets: 248 10*3/uL (ref 140–400)
RBC: 5.59 Million/uL (ref 4.20–5.80)
RDW: 14.6 % (ref 11.0–15.0)
Total Lymphocyte: 36.3 %
WBC: 5.2 10*3/uL (ref 3.8–10.8)

## 2020-06-05 LAB — HEPATIC FUNCTION PANEL
AG Ratio: 1.4 (calc) (ref 1.0–2.5)
ALT: 6 U/L — ABNORMAL LOW (ref 9–46)
AST: 14 U/L (ref 10–35)
Albumin: 4 g/dL (ref 3.6–5.1)
Alkaline phosphatase (APISO): 64 U/L (ref 35–144)
Bilirubin, Direct: 0.1 mg/dL (ref 0.0–0.2)
Globulin: 2.8 g/dL (ref 1.9–3.7)
Indirect Bilirubin: 0.3 mg/dL (ref 0.2–1.2)
Total Bilirubin: 0.4 mg/dL (ref 0.2–1.2)
Total Protein: 6.8 g/dL (ref 6.1–8.1)

## 2020-06-05 LAB — LIPID PANEL
Cholesterol: 205 mg/dL — ABNORMAL HIGH (ref <200)
HDL: 50 mg/dL (ref >=40)
LDL Cholesterol (Calc): 129 mg/dL (calc) — ABNORMAL HIGH
Non-HDL Cholesterol (Calc): 155 mg/dL (calc) — ABNORMAL HIGH (ref <130)
Total CHOL/HDL Ratio: 4.1 (calc) (ref <5.0)
Triglycerides: 145 mg/dL (ref <150)

## 2020-06-05 MED ORDER — ROSUVASTATIN CALCIUM 20 MG PO TABS
20.0000 mg | ORAL_TABLET | Freq: Every day | ORAL | 3 refills | Status: DC
Start: 2020-06-05 — End: 2021-12-17

## 2020-06-05 NOTE — Addendum Note (Signed)
Addended by: Delsa Grana on: 06/05/2020 08:46 AM   Modules accepted: Orders

## 2020-10-04 ENCOUNTER — Ambulatory Visit (INDEPENDENT_AMBULATORY_CARE_PROVIDER_SITE_OTHER): Payer: Medicare Other | Admitting: Family Medicine

## 2020-10-04 ENCOUNTER — Other Ambulatory Visit: Payer: Self-pay

## 2020-10-04 ENCOUNTER — Encounter: Payer: Self-pay | Admitting: Family Medicine

## 2020-10-04 VITALS — BP 138/84 | HR 76 | Temp 98.0°F | Resp 18 | Ht 70.0 in | Wt 161.9 lb

## 2020-10-04 DIAGNOSIS — E559 Vitamin D deficiency, unspecified: Secondary | ICD-10-CM | POA: Diagnosis not present

## 2020-10-04 DIAGNOSIS — F17200 Nicotine dependence, unspecified, uncomplicated: Secondary | ICD-10-CM

## 2020-10-04 DIAGNOSIS — Z5181 Encounter for therapeutic drug level monitoring: Secondary | ICD-10-CM

## 2020-10-04 DIAGNOSIS — N183 Chronic kidney disease, stage 3 unspecified: Secondary | ICD-10-CM

## 2020-10-04 DIAGNOSIS — J439 Emphysema, unspecified: Secondary | ICD-10-CM | POA: Diagnosis not present

## 2020-10-04 DIAGNOSIS — F5101 Primary insomnia: Secondary | ICD-10-CM

## 2020-10-04 DIAGNOSIS — I517 Cardiomegaly: Secondary | ICD-10-CM

## 2020-10-04 DIAGNOSIS — F3342 Major depressive disorder, recurrent, in full remission: Secondary | ICD-10-CM

## 2020-10-04 DIAGNOSIS — I700 Atherosclerosis of aorta: Secondary | ICD-10-CM

## 2020-10-04 DIAGNOSIS — I2510 Atherosclerotic heart disease of native coronary artery without angina pectoris: Secondary | ICD-10-CM

## 2020-10-04 DIAGNOSIS — Z23 Encounter for immunization: Secondary | ICD-10-CM | POA: Diagnosis not present

## 2020-10-04 DIAGNOSIS — E538 Deficiency of other specified B group vitamins: Secondary | ICD-10-CM | POA: Diagnosis not present

## 2020-10-04 DIAGNOSIS — E782 Mixed hyperlipidemia: Secondary | ICD-10-CM | POA: Diagnosis not present

## 2020-10-04 NOTE — Patient Instructions (Addendum)
Nori Riis, PA-C    Physician Assistant, Urology    Since 12/06/2015    [phone]   Try the trelegy inhaler once daily and rinse your mouth out after each time See if it helps your breathing and cough You can also try antihistamines at bedtime - like zyrtec or claritin, and/or mucinex  And keep working on cutting back smoking

## 2020-10-04 NOTE — Progress Notes (Signed)
Name: Manuel [name]   MRN: [id number]    DOB: 09-24-1956   Date:10/04/2020       Progress Note  Chief Complaint  Patient presents with  . Hyperlipidemia  . Depression     Subjective:   Manuel [name] is a 64 y.o. male, presents to clinic for routine f/up  HLD: Hyperlipidemia: Was on lipitor 80 and zetia -wasn't sure what he was taking when he was here 4 months ago Changed to crestor 20 mg once daily - trouble with pill burden -he's not sure if he's started to take this  Last Lipids: Lab Results  Component Value Date   CHOL 205 (H) 06/04/2020   HDL 50 06/04/2020   LDLCALC 129 (H) 06/04/2020   TRIG 145 06/04/2020   CHOLHDL 4.1 06/04/2020   - Denies: Chest pain, shortness of breath, myalgias, claudication  Hypertension:  Managed with diet lifestyle Blood pressure today is slightly elevated BP Readings from Last 3 Encounters:  10/04/20 138/84  06/04/20 130/70  05/15/20 128/82  Pt denies CP, SOB, exertional sx, LE edema, palpitation, Ha's, visual disturbances, lightheadedness, hypotension, syncope.   Current smoker  1/2 ppd More chronic cough this has worsened   Depression and insomnia On wellbutrin and trazodone: Depression screen [facility] 2/9 10/04/2020 06/04/2020 12/27/2019 05/25/2019 02/15/2019  Decreased Interest 0 1 0 0 0  Down, Depressed, Hopeless 0 1 0 0 0  PHQ - 2 Score 0 2 0 0 0  Altered sleeping 3 3 0 0 0  Tired, decreased energy 1 1 0 0 0  Change in appetite 0 0 0 0 0  Feeling bad or failure about yourself  0 0 0 0 0  Trouble concentrating 0 1 0 0 0  Moving slowly or fidgety/restless 0 0 0 0 0  Suicidal thoughts 0 0 0 0 0  PHQ-9 Score 4 7 0 0 0  Difficult doing work/chores Somewhat difficult Somewhat difficult Not difficult at all Not difficult at all Not difficult at all  Some recent data might be hidden  trazodone takes prn  Vit B12 supplements daily   Left shoulder pain working with ortho - Dr. Harlow Mares shot in July   CKD stage 3, HTN CKD, gross  hematuria - did see DR. Kolluru -not on any renal protective medications  BPH, PSA high on flomax, proscar and fildenafil seeing [facility]      Current Outpatient Medications:  .  aspirin EC 81 MG tablet, Take 1 tablet (81 mg total) by mouth daily., Disp: , Rfl:  .  buPROPion (WELLBUTRIN) 100 MG tablet, Take by mouth., Disp: , Rfl:  .  diazepam (VALIUM) 10 MG tablet, Take 10-20 mg by mouth daily., Disp: , Rfl:  .  finasteride (PROSCAR) 5 MG tablet, Take 1 tablet (5 mg total) by mouth daily., Disp: 90 tablet, Rfl: 3 .  rosuvastatin (CRESTOR) 20 MG tablet, Take 1 tablet (20 mg total) by mouth at bedtime., Disp: 90 tablet, Rfl: 3 .  sildenafil (REVATIO) 20 MG tablet, Take 3 to 5 tablets two hours before intercouse on an empty stomach.  Do not take with nitrates., Disp: 50 tablet, Rfl: 3 .  tamsulosin (FLOMAX) 0.4 MG CAPS capsule, TAKE 1 CAPSULE BY MOUTH ONCE DAILY, Disp: 90 capsule, Rfl: 3 .  traZODone (DESYREL) 50 MG tablet, Take 1 tablet (50 mg total) by mouth at bedtime as needed for sleep., Disp: 90 tablet, Rfl: 3 .  vitamin B-12 (CYANOCOBALAMIN) 500 MCG tablet, Take 1 tablet (500 mcg  total) by mouth daily., Disp: 30 tablet, Rfl: 11  Current Facility-Administered Medications:  .  cyanocobalamin ((VITAMIN B-12)) injection 1,000 mcg, 1,000 mcg, Intramuscular, Q30 days, Lada, Satira Anis, MD  Patient Active Problem List   Diagnosis Date Noted  . Benign hypertensive kidney disease with chronic kidney disease 11/23/2019  . Hematuria 11/23/2019  . Chronic kidney disease, stage III (moderate) ([place]) 11/12/2018  . Recurrent depression ([place]) 07/15/2018  . Bilateral occipital neuralgia 05/17/2018  . Chronic tension-type headache, intractable 05/17/2018  . Myofascial pain syndrome 05/17/2018  . Chronic daily headache 04/12/2018  . Coronary artery disease 01/11/2018  . Aortic atherosclerosis ([place]) 01/11/2018  . Pulmonary emphysema ([place]) 01/11/2018  . Mass of left thigh 12/30/2017  .  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 Vitamin D deficiency 01/14/2016  . Vitamin B12 deficiency 01/14/2016  . Benign neoplasm of cecum   . Benign neoplasm of descending colon   . Renal lesion 11/13/2015  . History of hematuria 11/13/2015  . Chronic lower back pain 11/09/2015  . BPH (benign prostatic hyperplasia) 11/01/2015  . Elevated PSA 11/01/2015  . Hyperlipidemia 11/01/2015  . Bunion, left foot 11/01/2015  . Sickle cell trait ([place]) 01/16/2015  . Cervical spondylosis without myelopathy 01/09/2015  . Carpal tunnel syndrome 01/09/2015  . Complete rotator cuff rupture of left shoulder 09/18/2014  . Rotator cuff syndrome of left shoulder 09/18/2014  . LVH (left ventricular hypertrophy) 08/18/2014  . Current smoker 10/03/2013    Past Surgical History:  Procedure Laterality Date  . CARPAL TUNNEL RELEASE Left   . COLONOSCOPY WITH PROPOFOL N/A 12/25/2015   Procedure: COLONOSCOPY WITH PROPOFOL;  Surgeon: Lucilla Lame, MD;  Location: [facility] ENDOSCOPY;  Service: Endoscopy;  Laterality: N/A;  . CYSTOURETHROSCOPY  08/25/12   with ureteral cathization w/wo retrograde pyelogram  . ROTATOR CUFF REPAIR Right 01/25/15  . SHOULDER ARTHROSCOPY WITH SUBACROMIAL DECOMPRESSION Right [id number]  . TRANSURETHRAL RESECTION OF PROSTATE  08/25/12    Family History  Problem Relation Age of Onset  . Aneurysm Mother   . Hypertension Mother   . Diabetes Father   . Hypertension Father   . Prostate cancer Father   . Lupus Father   . Cancer Maternal Aunt   . Cancer Maternal Uncle   . Diabetes Paternal Uncle   . Heart disease Cousin   . Prostate cancer Paternal Uncle   . COPD Neg Hx   . Stroke Neg Hx   . Kidney disease Neg Hx     Social History   Tobacco Use  . Smoking status: Current Every Day Smoker    Packs/day: 0.50    Years: 35.00    Pack years: 17.50    Types: Cigarettes  . Smokeless tobacco: Never Used  . Tobacco comment: 1-2 PPD for most of his 35 yr hx smoking, > 30 packyear   Vaping Use  . Vaping Use: Never used  Substance Use Topics  . Alcohol use: No  . 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 Penicillin G Other (See Comments)    Health Maintenance  Topic Date Due  . COVID-19 Vaccine (1) Never done  . COLONOSCOPY  12/24/2020  . TETANUS/TDAP  11/04/2023  . INFLUENZA VACCINE  Completed  . Hepatitis C Screening  Completed  . HIV Screening  Addressed    Chart Review Today: I personally reviewed active problem list, medication list, allergies, family history, social history, health maintenance, notes from last  encounter, lab results, imaging with the patient/caregiver today.   Review of Systems  10 Systems reviewed and are negative for acute change except as noted in the HPI.  Objective:   Vitals:   10/04/20 1511  BP: 138/84  Pulse: 76  Resp: 18  Temp: 98 F (36.7 C)  TempSrc: Oral  SpO2: 94%  Weight: 161 lb 14.4 oz (73.4 kg)  Height: 5\' 10"  (1.778 m)    Body mass index is 23.23 kg/m.  Physical Exam Vitals and nursing note reviewed.  Constitutional:      General: He is not in acute distress.    Appearance: Normal appearance. He is well-developed and normal weight. He is not ill-appearing, toxic-appearing or diaphoretic.     Interventions: Face mask in place.  HENT:     Head: Normocephalic and atraumatic.     Jaw: No trismus.     Right Ear: External ear normal.     Left Ear: External ear normal.  Eyes:     General: Lids are normal. No scleral icterus.       Right eye: No discharge.        Left eye: No discharge.     Conjunctiva/sclera: Conjunctivae normal.  Neck:     Trachea: Trachea and phonation normal. No tracheal deviation.  Cardiovascular:     Rate and Rhythm: Normal rate and regular rhythm.     Pulses: Normal pulses.          Radial pulses are 2+ on the right side and 2+ on the left side.       Posterior tibial pulses are 2+ on the right side and 2+ on the left side.      Heart sounds: Normal heart sounds. No murmur heard. No friction rub. No gallop.   Pulmonary:     Effort: Pulmonary effort is normal. No respiratory distress.     Breath sounds: Normal breath sounds. No stridor. No wheezing, rhonchi or rales.  Abdominal:     General: Bowel sounds are normal. There is no distension.     Palpations: Abdomen is soft.  Musculoskeletal:     Right lower leg: No edema.     Left lower leg: No edema.  Skin:    General: Skin is warm and dry.     Coloration: Skin is not jaundiced.     Findings: No rash.     Nails: There is no clubbing.  Neurological:     Mental Status: He is alert. Mental status is at baseline.     Cranial Nerves: No dysarthria or facial asymmetry.     Motor: No tremor or abnormal muscle tone.     Gait: Gait normal.  Psychiatric:        Mood and Affect: Mood normal.        Speech: Speech normal.        Behavior: Behavior normal. Behavior is cooperative.         Assessment & Plan:   1. Pulmonary emphysema, unspecified emphysema type ([place]) Worsening daily cough  Wants to try a daily inhaler Smoking cessation encouraged very strongly -  Smoking cessation instruction/counseling given:  counseled patient on the dangers of tobacco use, advised patient to stop smoking, and reviewed strategies to maximize success   2. Vitamin D deficiency History of deficiency last labs not concerning continue supplements over-the-counter  3. Vitamin B12 deficiency He is doing an oral supplement, recheck labs - CBC with Differential/Platelet - Vitamin B12  4. Mixed hyperlipidemia Likely has  not been compliant with statin, no difficulty or side effects when he was on a statin Recheck labs, restart medication, follow-up - COMPLETE METABOLIC PANEL WITH GFR - Lipid panel  5. Primary insomnia Patient would likely benefit from working on sleep hygiene, exercising, or seeing a therapist, can try over-the-counter melatonin and has been managed with  trazodone  6. Aortic atherosclerosis ([facility]) Restart statin, monitoring - COMPLETE METABOLIC PANEL WITH GFR - Lipid panel  7. Coronary artery disease involving native coronary artery of native heart without angina pectoris On statin and aspirin not on a beta-blocker or ACE or ARB?  Patient should be following with cardiology - COMPLETE METABOLIC PANEL WITH GFR - Lipid panel  8. LVH (left ventricular hypertrophy) Patient has some respiratory symptoms -suspect these are more related to likely chronic lung disease secondary to smoking then a CHF symptom -would like to get patient to follow-up with cardiology, no current signs of fluid overload - COMPLETE METABOLIC PANEL WITH GFR - Lipid panel  9. MDD (major depressive disorder), recurrent, in full remission ([place]) Continue Wellbutrin  10. Current smoker Encouraged to stop smoking see above  11. Stage 3 chronic kidney disease, unspecified whether stage 3a or 3b CKD ([place]) Patient has consulted with nephrology, due for recheck of labs - COMPLETE METABOLIC PANEL WITH GFR  12. Encounter for medication monitoring - CBC with Differential/Platelet - COMPLETE METABOLIC PANEL WITH GFR - Lipid panel - Vitamin B12  13. Need for influenza vaccination - Flu Vaccine QUAD 6+ mos PF IM (Fluarix Quad PF)    Return in about 6 months (around 04/04/2021) for Routine follow-up - bring pill bottles with you .   Delsa Grana, PA-C 10/04/20 3:36 PM

## 2020-10-05 LAB — CBC WITH DIFFERENTIAL/PLATELET
Absolute Monocytes: 323 cells/uL (ref 200–950)
Basophils Absolute: 32 cells/uL (ref 0–200)
Basophils Relative: 0.6 %
Eosinophils Absolute: 212 cells/uL (ref 15–500)
Eosinophils Relative: 4 %
HCT: 40.2 % (ref 38.5–50.0)
Hemoglobin: 13.4 g/dL (ref 13.2–17.1)
Lymphs Abs: 1866 cells/uL (ref 850–3900)
MCH: 25.8 pg — ABNORMAL LOW (ref 27.0–33.0)
MCHC: 33.3 g/dL (ref 32.0–36.0)
MCV: 77.3 fL — ABNORMAL LOW (ref 80.0–100.0)
MPV: 10.2 fL (ref 7.5–12.5)
Monocytes Relative: 6.1 %
Neutro Abs: 2867 cells/uL (ref 1500–7800)
Neutrophils Relative %: 54.1 %
Platelets: 256 10*3/uL (ref 140–400)
RBC: 5.2 10*6/uL (ref 4.20–5.80)
RDW: 13.9 % (ref 11.0–15.0)
Total Lymphocyte: 35.2 %
WBC: 5.3 10*3/uL (ref 3.8–10.8)

## 2020-10-05 LAB — COMPLETE METABOLIC PANEL WITH GFR
AG Ratio: 1.5 (calc) (ref 1.0–2.5)
ALT: 7 U/L — ABNORMAL LOW (ref 9–46)
AST: 14 U/L (ref 10–35)
Albumin: 3.8 g/dL (ref 3.6–5.1)
Alkaline phosphatase (APISO): 62 U/L (ref 35–144)
BUN/Creatinine Ratio: 9 (calc) (ref 6–22)
BUN: 13 mg/dL (ref 7–25)
CO2: 22 mmol/L (ref 20–32)
Calcium: 9.3 mg/dL (ref 8.6–10.3)
Chloride: 105 mmol/L (ref 98–110)
Creat: 1.4 mg/dL — ABNORMAL HIGH (ref 0.70–1.25)
GFR, Est African American: 62 mL/min/{1.73_m2} (ref >=60)
GFR, Est Non African American: 53 mL/min/{1.73_m2} — ABNORMAL LOW (ref >=60)
Globulin: 2.6 g/dL (calc) (ref 1.9–3.7)
Glucose, Bld: 104 mg/dL — ABNORMAL HIGH (ref 65–99)
Potassium: 3.8 mmol/L (ref 3.5–5.3)
Sodium: 142 mmol/L (ref 135–146)
Total Bilirubin: 0.2 mg/dL (ref 0.2–1.2)
Total Protein: 6.4 g/dL (ref 6.1–8.1)

## 2020-10-05 LAB — LIPID PANEL
Cholesterol: 213 mg/dL — ABNORMAL HIGH (ref <200)
HDL: 50 mg/dL (ref >=40)
LDL Cholesterol (Calc): 121 mg/dL (calc) — ABNORMAL HIGH
Non-HDL Cholesterol (Calc): 163 mg/dL (calc) — ABNORMAL HIGH (ref <130)
Total CHOL/HDL Ratio: 4.3 (calc) (ref <5.0)
Triglycerides: 271 mg/dL — ABNORMAL HIGH (ref <150)

## 2020-10-05 LAB — VITAMIN B12: Vitamin B-12: 328 pg/mL (ref 200–1100)

## 2020-10-11 ENCOUNTER — Other Ambulatory Visit: Payer: Self-pay | Admitting: Family Medicine

## 2020-10-11 MED ORDER — VITAMIN B-12 500 MCG PO TABS: 1000.0000 ug | ORAL_TABLET | Freq: Every day | ORAL | 11 refills | Status: DC

## 2020-10-30 LAB — HEMOGLOBIN A1C: Hemoglobin A1C: 5.8

## 2020-10-31 DIAGNOSIS — F3342 Major depressive disorder, recurrent, in full remission: Secondary | ICD-10-CM | POA: Insufficient documentation

## 2020-10-31 DIAGNOSIS — F5101 Primary insomnia: Secondary | ICD-10-CM | POA: Insufficient documentation

## 2020-11-12 ENCOUNTER — Other Ambulatory Visit: Payer: Self-pay

## 2020-11-12 ENCOUNTER — Other Ambulatory Visit: Payer: Medicare Other

## 2020-11-12 DIAGNOSIS — R972 Elevated prostate specific antigen [PSA]: Secondary | ICD-10-CM

## 2020-11-12 NOTE — Progress Notes (Signed)
11/13/2020  8:05 PM   Manuel [name] 29-Mar-1956 [id number]  Referring provider: Delsa Grana, PA-C [address] [place],  [place] [address]  Chief Complaint  Patient presents with  . Follow-up   Urological history 1. Elevated PSA - Benign prostate biopsy in 10/2016 - PSA 6.1 - Prostate MRI on 07/25/2019 revealed a small PI-RADS category 4 lesion in the right posterolateral peripheral zone of the prostate apex is present. Targeting data sent to [facility].  Considerable benign prostatic hypertrophy, prostate volume 78.53 cubic cm.  Thin peripheral zone with generalized low T2 signal, probably postinflammatory.  Sigmoid colon diverticulosis. 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 BPH with LU TS - I PSS 12/3 - TURP 2013, recommended to undergo HoLEP, but deferred - cysto 10/2019 coapting lateral lobes prostate, moderate elevation bladder neck, mild trabeculation and small left inferior wall diverticulum  - taking tamsulosin 0.4 mg daily and finasteride 5 mg daily  3. High risk hematuria - smoker - work up in 2013.  He underwent a CT Urogram, cystoscopy with bilateral retrogrades and ultmately a TURP with Dr. Darcus Austin at [facility].  No malignancies were discovered.  Hematuria was from his enlarged, friable prostate.  He has not had any gross hematuria since that time.  A too small to characterize lesion was seen in the left midpole of the kidney on the CT Urogram in 2013.   RUS 05/2019 Increased echogenicity in the cortex of both kidneys is consistent with medical renal disease. No other acute abnormalities identified - cysto 10/2019 no malignancy found  - had an episode of gross hematuria before Christmas after taking a new cholesterol medication  4. ED - SHIM 15 - contributing factors of age, smoking, BPH, CAD, HLD, depression and antidepressents - taking sildenafil 20 mg, 3 to 5 tablets prior to intercourse  HPI: Manuel [name] is a 65  y.o.  male who presents today for a 6 month follow up.    He states he was given a medication for cholesterol and after taking it for two days, he saw blood in his urine.  He stopped the medication and the blood cleared.  Patient denies any modifying or aggravating factors.  Patient denies any gross hematuria, dysuria or suprapubic/flank pain.  Patient denies any fevers, chills, nausea or vomiting.    IPSS    Row Name 11/13/20 1500         International Prostate Symptom Score   How often have you had the sensation of not emptying your bladder? Less than 1 in 5     How often have you had to urinate less than every two hours? Less than 1 in 5 times     How often have you found you stopped and started again several times when you urinated? Less than half the time     How often have you found it difficult to postpone urination? Almost always     How often have you had a weak urinary stream? Less than 1 in 5 times     How often have you had to strain to start urination? Less than 1 in 5 times     How many times did you typically get up at night to urinate? 1 Time     Total IPSS Score 12           Quality of Life due to urinary symptoms   If you were to spend the rest of your  life with your urinary condition just the way it is now how would you feel about that? Mixed            Score:  1-7 Mild 8-19 Moderate 20-35 Severe  He is not interested in sex as much as he has had in the past due to life stressors.  Patient still having spontaneous erections.   He denies any pain or curvature with erections.    SHIM    Row Name 11/13/20 1534         SHIM: Over the last 6 months:   How do you rate your confidence that you could get and keep an erection? Low     When you had erections with sexual stimulation, how often were your erections hard enough for penetration (entering your partner)? Sometimes (about half the time)     During sexual intercourse, how often were you able to maintain your  erection after you had penetrated (entered) your partner? Sometimes (about half the time)     During sexual intercourse, how difficult was it to maintain your erection to completion of intercourse? Slightly Difficult     When you attempted sexual intercourse, how often was it satisfactory for you? Sometimes (about half the time)           SHIM Total Score   SHIM 15             PMH: Past Medical History:  Diagnosis Date  . Anemia   . Anxiety   . Aortic atherosclerosis ([place]) 01/11/2018   CT scan Feb 2019  . Asthma   . Bilateral carpal tunnel syndrome   . Bilateral hand numbness   . BPH without urinary obstruction   . Cervical spondylosis without myelopathy   . Complete tear of rotator cuff    bilateral  . COPD (chronic obstructive pulmonary disease) ([place])   . Depression   . Emphysema lung ([place]) 01/11/2018   Chest CT Feb 2019  . History of stomach ulcers   . HLD (hyperlipidemia)   . LVH (left ventricular hypertrophy)   . Personal history of tobacco use, presenting hazards to health 12/21/2015  . Poor dentition   . Sickle cell trait ([place])   . Sleep apnea   . Smoker     Surgical History: Past Surgical History:  Procedure Laterality Date  . CARPAL TUNNEL RELEASE Left   . COLONOSCOPY WITH PROPOFOL N/A 12/25/2015   Procedure: COLONOSCOPY WITH PROPOFOL;  Surgeon: Lucilla Lame, MD;  Location: [facility] ENDOSCOPY;  Service: Endoscopy;  Laterality: N/A;  . CYSTOURETHROSCOPY  08/25/12   with ureteral cathization w/wo retrograde pyelogram  . ROTATOR CUFF REPAIR Right 01/25/15  . SHOULDER ARTHROSCOPY WITH SUBACROMIAL DECOMPRESSION Right [id number]  . TRANSURETHRAL RESECTION OF PROSTATE  08/25/12    Home Medications:  Allergies as of 11/13/2020      Reactions   Penicillin G Other (See Comments)      Medication List       Accurate as of November 13, 2020 11:59 PM. If you have any questions, ask your nurse or doctor.        aspirin EC 81 MG tablet Take 1 tablet (81 mg total) by mouth  daily.   buPROPion 100 MG tablet Commonly known as: WELLBUTRIN Take by mouth.   diazepam 10 MG tablet Commonly known as: VALIUM Take 10-20 mg by mouth daily.   finasteride 5 MG tablet Commonly known as: PROSCAR Take 1 tablet (5 mg total) by mouth daily.   rosuvastatin  20 MG tablet Commonly known as: Crestor Take 1 tablet (20 mg total) by mouth at bedtime.   sildenafil 20 MG tablet Commonly known as: REVATIO Take 3 to 5 tablets two hours before intercouse on an empty stomach.  Do not take with nitrates.   tamsulosin 0.4 MG Caps capsule Commonly known as: FLOMAX TAKE 1 CAPSULE BY MOUTH ONCE DAILY   traZODone 50 MG tablet Commonly known as: DESYREL Take 1 tablet (50 mg total) by mouth at bedtime as needed for sleep.   vitamin B-12 500 MCG tablet Commonly known as: CYANOCOBALAMIN Take 2 tablets (1,000 mcg total) by mouth daily.       Allergies:  Allergies  Allergen Reactions  . Penicillin G Other (See Comments)    Family History: Family History  Problem Relation Age of Onset  . Aneurysm Mother   . Hypertension Mother   . Diabetes Father   . Hypertension Father   . Prostate cancer Father   . Lupus Father   . Cancer Maternal Aunt   . Cancer Maternal Uncle   . Diabetes Paternal Uncle   . Heart disease Cousin   . Prostate cancer Paternal Uncle   . COPD Neg Hx   . Stroke Neg Hx   . Kidney disease Neg Hx     Social History:  reports that he has been smoking cigarettes. He has a 17.50 pack-year smoking history. He has never used smokeless tobacco. He reports that he does not drink alcohol and does not use drugs.  ROS: For pertinent review of systems please refer to history of present illness  Physical Exam: BP (!) 158/110   Pulse 83   Ht 5\' 9"  (1.753 m)   Wt 169 lb (76.7 kg)   BMI 24.96 kg/m   Constitutional:  Well nourished. Alert and oriented, No acute distress. HEENT: [place] AT, mask in place.  Trachea midline Cardiovascular: No clubbing, cyanosis, or  edema. Respiratory: Normal respiratory effort, no increased work of breathing. GU: No CVA tenderness.  No bladder fullness or masses.  Patient with circumcised phallus.  Urethral meatus is patent.  No penile discharge. No penile lesions or rashes. Scrotum without lesions, cysts, rashes and/or edema.  Testicles are located scrotally bilaterally. No masses are appreciated in the testicles. Left and right epididymis are normal. Rectal: Patient with  normal sphincter tone. Anus and perineum without scarring or rashes. No rectal masses are appreciated. Prostate is approximately 60+  grams, could only palpate apex and midportion of gland, no nodules are appreciated. Seminal vesicles could not be palpated.  Lymph: No inguinal adenopathy. Neurologic: Grossly intact, no focal deficits, moving all 4 extremities. Psychiatric: Normal mood and affect.   Laboratory Data: Component     Latest Ref Rng & Units 10/04/2020  Cholesterol     <200 mg/dL 213 (H)  HDL Cholesterol     > OR = 40 mg/dL 50  Triglycerides     <150 mg/dL 271 (H)  LDL Cholesterol (Calc)     mg/dL (calc) 121 (H)  Total CHOL/HDL Ratio     <5.0 (calc) 4.3  Non-HDL Cholesterol (Calc)     <130 mg/dL (calc) 163 (H)   Component     Latest Ref Rng & Units 10/04/2020  Glucose     65 - 99 mg/dL 104 (H)  BUN     7 - 25 mg/dL 13  Creatinine     0.70 - 1.25 mg/dL 1.40 (H)  GFR, Est Non African American     >  OR = 60 mL/min/1.40m2 53 (L)  GFR, Est African American     > OR = 60 mL/min/1.68m2 62  BUN/Creatinine Ratio     6 - 22 (calc) 9  Sodium     135 - 146 mmol/L 142  Potassium     3.5 - 5.3 mmol/L 3.8  Chloride     98 - 110 mmol/L 105  CO2     20 - 32 mmol/L 22  Calcium     8.6 - 10.3 mg/dL 9.3  Total Protein     6.1 - 8.1 g/dL 6.4  Albumin MSPROF     3.6 - 5.1 g/dL 3.8  Globulin     1.9 - 3.7 g/dL (calc) 2.6  AG Ratio     1.0 - 2.5 (calc) 1.5  Total Bilirubin     0.2 - 1.2 mg/dL 0.2  Alkaline phosphatase (APISO)      35 - 144 U/L 62  AST     10 - 35 U/L 14  ALT     9 - 46 U/L 7 (L)   Component     Latest Ref Rng & Units 10/04/2020  WBC     3.8 - 10.8 Thousand/uL 5.3  RBC     4.20 - 5.80 Million/uL 5.20  Hemoglobin     13.2 - 17.1 g/dL 13.4  HCT     38.5 - 50.0 % 40.2  MCV     80.0 - 100.0 fL 77.3 (L)  MCH     27.0 - 33.0 pg 25.8 (L)  MCHC     32.0 - 36.0 g/dL 33.3  RDW     11.0 - 15.0 % 13.9  Platelets     140 - 400 Thousand/uL 256  Neutrophils     % 54.1  Lymphs     %   Monocytes     %   Eos     %   Basos     %   NEUT#     1,500 - 7,800 cells/uL 2,867  Lymphocyte #     850 - 3,900 cells/uL 1,866  Monocytes Absolute     0.1 - 0.9 x10E3/uL   EOS (ABSOLUTE)     0.0 - 0.4 x10E3/uL   Basophils Absolute     0 - 200 cells/uL 32  Immature Granulocytes     %   Immature Grans (Abs)     0.0 - 0.1 x10E3/uL   MPV     7.5 - 12.5 fL 10.2  Monocyte #     200 - 950 cells/uL   Eosinophils Absolute     15 - 500 cells/uL 212  Lymphocytes     %   Monocytes Relative     % 6.1  Eosinophil     % 4.0  Basophil     % 0.6  Smear Review        WBC mixed population     200 - 950 cells/uL   Total Lymphocyte     % 35.2  Absolute Monocytes     200 - 950 cells/uL 323  WBC, UA     0 - 5 /hpf   Epithelial Cells (non renal)     0 - 10 /hpf   Renal Epithel, UA     None seen /hpf   Casts     None seen /lpf   Cast Type     N/A   Bacteria, UA     None seen/Few  Component     Latest Ref Rng & Units 10/04/2020  Total Protein     6.1 - 8.1 g/dL 6.4  Albumin MSPROF     3.6 - 5.1 g/dL 3.8  Globulin     1.9 - 3.7 g/dL (calc) 2.6  AG Ratio     1.0 - 2.5 (calc) 1.5  Total Bilirubin     0.2 - 1.2 mg/dL 0.2  Bilirubin, Direct     0.0 - 0.2 mg/dL   Indirect Bilirubin     0.2 - 1.2 mg/dL (calc)   Alkaline phosphatase (APISO)     35 - 144 U/L 62  AST     10 - 35 U/L 14  ALT     9 - 46 U/L 7 (L)   Urinalysis Component     Latest Ref Rng & Units 05/15/2020  Specific  Gravity, UA     1.005 - 1.030 1.025  pH, UA     5.0 - 7.5 5.5  Color, UA     Yellow Orange  Appearance Ur     Clear Hazy (A)  Leukocytes,UA     Negative Negative  Protein,UA     Negative/Trace 2+ (A)  Glucose, UA     Negative Negative  Ketones, UA     Negative Negative  RBC, UA     Negative Negative  Bilirubin, UA     Negative Negative  Urobilinogen, Ur     0.2 - 1.0 mg/dL 1.0  Nitrite, UA     Negative Negative  Microscopic Examination      See below:   Component     Latest Ref Rng & Units 05/15/2020  WBC, UA     0 - 5 /hpf 6-10 (A)  RBC     0 - 2 /hpf 0-2  Epithelial Cells (non renal)     0 - 10 /hpf 0-10  Renal Epithel, UA     None seen /hpf 0-10 (A)  Casts     None seen /lpf Present (A)  Cast Type     N/A Hyaline casts  Bacteria, UA     None seen/Few None seen  I have reviewed the labs.  Pertinent Imaging No imaging since last visit  Assessment & Plan:    1. Elevated PSA PSA pending   2. BPH with LUTS IPSS score is 12/3, it is improving Continue conservative management, avoiding bladder irritants and timed voiding's Recommended to have a HoLEP, but he deferred due to wife's medical issues Continue tamsulosin 0.4 mg daily and finasteride 5 mg daily RTC in 6 months for IPSS, PSA, PVR and exam   3. Erectile dysfunction SHIM score is 15, it is worsening Continue sildenafil 20 mg on-demand-dosing RTC in 6 months for repeat SHIM score and exam   4. High risk hematuria - work up completed in 2013 - positive for an enlarged prostate - recent episodes of gross heme - explained that we need to repeat upper tract imaging at this time which would be a CT urogram as the gross heme may be a sign of a GU malignancy - patient is in agreement and wishes to proceed  - RTC for CT urogram report   Return for CT urogram report .  Zara Council, PA-C [facility] [address], [place], [place] [address] [phone]

## 2020-11-13 ENCOUNTER — Encounter: Payer: Self-pay | Admitting: Urology

## 2020-11-13 ENCOUNTER — Ambulatory Visit (INDEPENDENT_AMBULATORY_CARE_PROVIDER_SITE_OTHER): Payer: Medicare Other | Admitting: Urology

## 2020-11-13 VITALS — BP 158/110 | HR 83 | Ht 69.0 in | Wt 169.0 lb

## 2020-11-13 DIAGNOSIS — N401 Enlarged prostate with lower urinary tract symptoms: Secondary | ICD-10-CM | POA: Diagnosis not present

## 2020-11-13 DIAGNOSIS — N529 Male erectile dysfunction, unspecified: Secondary | ICD-10-CM | POA: Diagnosis not present

## 2020-11-13 DIAGNOSIS — N138 Other obstructive and reflux uropathy: Secondary | ICD-10-CM

## 2020-11-13 DIAGNOSIS — R310 Gross hematuria: Secondary | ICD-10-CM | POA: Diagnosis not present

## 2020-11-13 DIAGNOSIS — R972 Elevated prostate specific antigen [PSA]: Secondary | ICD-10-CM | POA: Diagnosis not present

## 2020-11-13 DIAGNOSIS — R319 Hematuria, unspecified: Secondary | ICD-10-CM

## 2020-11-13 LAB — PSA: Prostate Specific Ag, Serum: 5.5 ng/mL — ABNORMAL HIGH (ref 0.0–4.0)

## 2020-11-29 ENCOUNTER — Encounter: Payer: Self-pay | Admitting: Urology

## 2020-11-29 ENCOUNTER — Ambulatory Visit: Payer: Self-pay | Admitting: Urology

## 2020-12-09 ENCOUNTER — Encounter: Payer: Self-pay | Admitting: Urology

## 2020-12-09 NOTE — Progress Notes (Signed)
Letter sent.

## 2020-12-28 ENCOUNTER — Other Ambulatory Visit: Payer: Self-pay | Admitting: *Deleted

## 2020-12-28 DIAGNOSIS — Z122 Encounter for screening for malignant neoplasm of respiratory organs: Secondary | ICD-10-CM

## 2020-12-28 DIAGNOSIS — Z87891 Personal history of nicotine dependence: Secondary | ICD-10-CM

## 2020-12-28 DIAGNOSIS — F17200 Nicotine dependence, unspecified, uncomplicated: Secondary | ICD-10-CM

## 2020-12-28 NOTE — Progress Notes (Signed)
Contacted and scheduled for annual lung screening scan. Patient is a current smoker with a 55.5 pack year history.

## 2021-01-10 ENCOUNTER — Other Ambulatory Visit: Payer: Self-pay

## 2021-01-10 ENCOUNTER — Ambulatory Visit
Admission: RE | Admit: 2021-01-10 | Discharge: 2021-01-10 | Disposition: A | Discharge status: Home / Self Care | Payer: Medicare Other | Source: Ambulatory Visit | Attending: Nurse Practitioner | Admitting: Nurse Practitioner

## 2021-01-10 ENCOUNTER — Other Ambulatory Visit: Payer: Self-pay | Admitting: Urology

## 2021-01-10 ENCOUNTER — Telehealth: Payer: Self-pay | Admitting: Urology

## 2021-01-10 DIAGNOSIS — F17200 Nicotine dependence, unspecified, uncomplicated: Secondary | ICD-10-CM | POA: Diagnosis present

## 2021-01-10 DIAGNOSIS — R310 Gross hematuria: Secondary | ICD-10-CM

## 2021-01-10 DIAGNOSIS — Z122 Encounter for screening for malignant neoplasm of respiratory organs: Secondary | ICD-10-CM | POA: Insufficient documentation

## 2021-01-10 DIAGNOSIS — Z87891 Personal history of nicotine dependence: Secondary | ICD-10-CM | POA: Diagnosis not present

## 2021-01-10 NOTE — Telephone Encounter (Signed)
Mr. Peschke needs to have a CT urogram for gross hematuria.   Pt stopped in lobby, he is confusing me! He is saying he missed an appt. The only one I see is for CT results. He said he needs to have labs and schedule an appt with you. He is gone, but he said to call and let him know what you want him to do.

## 2021-01-10 NOTE — Progress Notes (Unsigned)
Orders in for CT urogram

## 2021-01-10 NOTE — Telephone Encounter (Signed)
Patient notified and he is willing to do the CT scan. I don't see the order in Epic now.

## 2021-01-17 ENCOUNTER — Telehealth: Payer: Self-pay | Admitting: *Deleted

## 2021-01-17 NOTE — Telephone Encounter (Signed)
Will review at his next routine fup appointment - thanks

## 2021-01-17 NOTE — Telephone Encounter (Signed)
Notified patient of LDCT lung cancer screening program results with recommendation for 12 month follow up imaging. Also notified of incidental findings noted below and is encouraged to discuss further with PCP who will receive a copy of this note and/or the CT report. Patient verbalizes understanding.   IMPRESSION: 1. Lung-RADS 2, benign appearance or behavior. Continue annual screening with low-dose chest CT without contrast in 12 months. 2. Aortic Atherosclerosis (ICD10-I70.0) and Emphysema (ICD10-J43.9). Coronary artery atherosclerosis. 3. Right-sided pleural thickening and calcification, likely related to prior hemothorax or empyema.

## 2021-01-29 ENCOUNTER — Ambulatory Visit: Admission: RE | Admit: 2021-01-29 | Payer: Medicare Other | Source: Ambulatory Visit

## 2021-01-31 ENCOUNTER — Telehealth: Payer: Self-pay | Admitting: Urology

## 2021-01-31 NOTE — Telephone Encounter (Signed)
Would you call Manuel [name] and have him reschedule his missed CT urogram appointment from last week?

## 2021-02-01 NOTE — Telephone Encounter (Signed)
LMOM informed patient to contact the imaging department to reschedule the CT scan. The phone number to imaging was given.

## 2021-02-05 ENCOUNTER — Other Ambulatory Visit: Payer: Self-pay | Admitting: Urology

## 2021-02-05 ENCOUNTER — Telehealth: Payer: Self-pay | Admitting: Urology

## 2021-02-05 DIAGNOSIS — R310 Gross hematuria: Secondary | ICD-10-CM

## 2021-02-05 NOTE — Progress Notes (Signed)
Orders in 

## 2021-02-05 NOTE — Telephone Encounter (Signed)
Pt missed his CT appt last week and called to reschedule and CT told him that he would need to get a new order put in in order to reschedule the CT.

## 2021-02-22 ENCOUNTER — Other Ambulatory Visit: Payer: Self-pay

## 2021-02-22 ENCOUNTER — Ambulatory Visit
Admission: RE | Admit: 2021-02-22 | Discharge: 2021-02-22 | Disposition: A | Discharge status: Home / Self Care | Payer: Medicare Other | Source: Ambulatory Visit | Attending: Urology | Admitting: Urology

## 2021-02-22 DIAGNOSIS — R310 Gross hematuria: Secondary | ICD-10-CM | POA: Diagnosis present

## 2021-02-22 LAB — POCT I-STAT CREATININE: Creatinine, Ser: 1.4 mg/dL — ABNORMAL HIGH (ref 0.61–1.24)

## 2021-02-22 MED ORDER — IOHEXOL 300 MG/ML  SOLN
125.0000 mL | Freq: Once | INTRAMUSCULAR | Status: AC | PRN
  Administered 2021-02-22: 125 mL via INTRAVENOUS

## 2021-02-25 ENCOUNTER — Telehealth: Payer: Self-pay | Admitting: Urology

## 2021-02-25 NOTE — Telephone Encounter (Signed)
Mr. Streater needs an appointment with either Dr. Erlene Quan or Dr. Diamantina Providence to discuss HoLEP.

## 2021-03-06 ENCOUNTER — Other Ambulatory Visit: Payer: Self-pay

## 2021-03-06 ENCOUNTER — Ambulatory Visit (INDEPENDENT_AMBULATORY_CARE_PROVIDER_SITE_OTHER): Payer: Medicare Other | Admitting: Urology

## 2021-03-06 ENCOUNTER — Encounter: Payer: Self-pay | Admitting: Urology

## 2021-03-06 ENCOUNTER — Ambulatory Visit: Payer: Medicare Other | Admitting: Urology

## 2021-03-06 VITALS — BP 133/75 | HR 87 | Ht 70.0 in | Wt 160.0 lb

## 2021-03-06 DIAGNOSIS — N401 Enlarged prostate with lower urinary tract symptoms: Secondary | ICD-10-CM | POA: Diagnosis not present

## 2021-03-06 DIAGNOSIS — N138 Other obstructive and reflux uropathy: Secondary | ICD-10-CM

## 2021-03-06 DIAGNOSIS — R310 Gross hematuria: Secondary | ICD-10-CM

## 2021-03-06 LAB — URINALYSIS, COMPLETE
Bilirubin, UA: NEGATIVE
Glucose, UA: NEGATIVE
Ketones, UA: NEGATIVE
Leukocytes,UA: NEGATIVE
Nitrite, UA: NEGATIVE
RBC, UA: NEGATIVE
Specific Gravity, UA: 1.01 (ref 1.005–1.030)
Urobilinogen, Ur: 0.2 mg/dL (ref 0.2–1.0)
pH, UA: 5.5 (ref 5.0–7.5)

## 2021-03-06 LAB — MICROSCOPIC EXAMINATION: Bacteria, UA: NONE SEEN

## 2021-03-06 LAB — BLADDER SCAN AMB NON-IMAGING

## 2021-03-06 NOTE — Progress Notes (Signed)
   03/06/2021 11:03 AM   [name] 1956/05/24 [id number]  Reason for visit: Follow up BPH and gross hematuria  HPI: 65 year old male who I previously saw in March 2021 to discuss HOLEP.  His wife ended up in the hospital for a few months at that time and he never scheduled surgery.    Briefly, his urologic history is notable for hematuria and clot retention in 2013 that was managed at [facility] with an emergent TUR of a large median lobe.  Per the operative report, the lateral lobes were not resected at that time.  He has been followed by Zara Council long-term in our clinic for longstanding history of BPH on maximal medical therapy with Flomax and finasteride, as well as an elevated PSA.  He recently was found to have a PSA of 5.6, corrected for finasteride 11.2, and underwent a prostate MRI on 07/25/2019 that showed an 80 g prostate with a PI-RADS 4 lesion in the peripheral zone of the apex.  He underwent fusion biopsy at [facility] in November 2020 which showed only high-grade PIN in the ROI, but was otherwise benign.  He has developed recurrent gross hematuria with heavy lifting over the last few months, and Shannon appropriately ordered a CT urogram.  I personally viewed and interpreted the CT dated 02/24/2021 that shows enlarged prostate with protrusion into the bladder measuring 80 g, bladder thickness and trabeculation, but no renal masses or filling defects.  We discussed options include continuing maximal medical therapy with Flomax and finasteride, with high risk for recurrent gross hematuria and likely clot retention, as well as persistent urinary symptoms.  Other alternatives would include surgical intervention with HOLEP.  He would be able to discontinue both Flomax and finasteride post procedure.  We discussed the risks and benefits of HoLEP at length.  The procedure requires general anesthesia and takes 2 to 3 hours, and a holmium laser is used to enucleate the prostate and  push this tissue into the bladder.  A morcellator is then used to remove this tissue, which is sent for pathology.  The vast majority of patients are able to discharge the same day with a catheter in place for 2 to 3 days, and will follow-up in clinic for a voiding trial.  Approximately 5% of patients will be admitted overnight to monitor the urine, or if they have multiple co-morbidities.  We specifically discussed the risks of bleeding, infection, retrograde ejaculation, temporary urgency and urge incontinence, very low risk of long-term incontinence, pathologic evaluation of prostate tissue and possible detection of prostate cancer or other malignancy, and possible need for additional procedures.  Schedule HOLEP  [place], [facility] [address] [place], [place] [address] [phone]

## 2021-03-06 NOTE — Patient Instructions (Signed)

## 2021-03-11 ENCOUNTER — Telehealth: Payer: Self-pay | Admitting: Urology

## 2021-03-11 NOTE — Telephone Encounter (Signed)
LMOM for pt. To return my call to discuss scheduling surgery with Dr. Diamantina Providence.

## 2021-03-12 LAB — CULTURE, URINE COMPREHENSIVE

## 2021-03-14 ENCOUNTER — Other Ambulatory Visit: Payer: Self-pay | Admitting: Urology

## 2021-03-14 DIAGNOSIS — N138 Other obstructive and reflux uropathy: Secondary | ICD-10-CM

## 2021-03-25 ENCOUNTER — Other Ambulatory Visit: Payer: Self-pay

## 2021-03-25 ENCOUNTER — Other Ambulatory Visit: Payer: Medicare Other

## 2021-03-25 DIAGNOSIS — R310 Gross hematuria: Secondary | ICD-10-CM

## 2021-03-26 LAB — URINALYSIS, COMPLETE
Bilirubin, UA: NEGATIVE
Glucose, UA: NEGATIVE
Ketones, UA: NEGATIVE
Leukocytes,UA: NEGATIVE
Nitrite, UA: NEGATIVE
RBC, UA: NEGATIVE
Specific Gravity, UA: 1.02 (ref 1.005–1.030)
Urobilinogen, Ur: 0.2 mg/dL (ref 0.2–1.0)
pH, UA: 5.5 (ref 5.0–7.5)

## 2021-03-26 LAB — MICROSCOPIC EXAMINATION: Bacteria, UA: NONE SEEN

## 2021-03-28 LAB — CULTURE, URINE COMPREHENSIVE

## 2021-04-03 ENCOUNTER — Other Ambulatory Visit: Payer: Self-pay

## 2021-04-03 ENCOUNTER — Other Ambulatory Visit
Admission: RE | Admit: 2021-04-03 | Discharge: 2021-04-03 | Disposition: A | Discharge status: Home / Self Care | Payer: Medicare Other | Source: Ambulatory Visit | Attending: Urology | Admitting: Urology

## 2021-04-03 HISTORY — DX: Chronic kidney disease, unspecified: N18.9

## 2021-04-03 NOTE — Patient Instructions (Addendum)
Your procedure is scheduled on: April 05, 2021 FRIDAY Report to the Registration Desk on the 1st floor of the [facility]. To find out your arrival time, please call [phone] between 1PM - 3PM on: Thursday April 04, 2021 THURSDAY  REMEMBER: Instructions that are not followed completely may result in serious medical risk, up to and including death; or upon the discretion of your surgeon and anesthesiologist your surgery may need to be rescheduled.  Do EAT OR DRINK after midnight the night before surgery.  No gum chewing, lozengers or hard candies.  TAKE THESE MEDICATIONS THE MORNING OF SURGERY WITH A SIP OF WATER: NONE  One week prior to surgery: Stop Anti-inflammatories (NSAIDS) such as Advil, Aleve, Ibuprofen, Motrin, Naproxen, Naprosyn and ASPIRIN OR Aspirin based products such as Excedrin, Goodys Powder, BC Powder. Stop ANY OVER THE COUNTER supplements until after surgery. You may however, continue to take Tylenol if needed for pain up until the day of surgery.  No Alcohol for 24 hours before or after surgery.  No Smoking including e-cigarettes for 24 hours prior to surgery.  No chewable tobacco products for at least 6 hours prior to surgery.  No nicotine patches on the day of surgery.  Do not use any "recreational" drugs for at least a week prior to your surgery.  Please be advised that the combination of cocaine and anesthesia may have negative outcomes, up to and including death. If you test positive for cocaine, your surgery will be cancelled.  On the morning of surgery brush your teeth with toothpaste and water, you may rinse your mouth with mouthwash if you wish. Do not swallow any toothpaste or mouthwash.  SHOWER WITH CHG SOAP AS DIRECTED   Do not wear jewelry, make-up, hairpins, clips or nail polish.  Do not wear lotions, powders, or perfumes OR DEODORANT  Do not shave body from the neck down 48 hours prior to surgery just in case you cut yourself which could leave  a site for infection.  Also, freshly shaved skin may become irritated if using the CHG soap.  Contact lenses, hearing aids and dentures may not be worn into surgery.  Do not bring valuables to the hospital. [facility] is not responsible for any missing/lost belongings or valuables.   Notify your doctor if there is any change in your medical condition (cold, fever, infection).  Wear comfortable clothing (specific to your surgery type) to the hospital.  Plan for stool softeners for home use; pain medications have a tendency to cause constipation. You can also help prevent constipation by eating foods high in fiber such as fruits and vegetables and drinking plenty of fluids as your diet allows.  After surgery, you can help prevent lung complications by doing breathing exercises.  Take deep breaths and cough every 1-2 hours. Your doctor may order a device called an Incentive Spirometer to help you take deep breaths. When coughing or sneezing, hold a pillow firmly against your incision with both hands. This is called "splinting." Doing this helps protect your incision. It also decreases belly discomfort.  If you are being discharged the day of surgery, you will not be allowed to drive home. You will need a responsible adult (18 years or older) to drive you home and stay with you that night.   If you are taking public transportation, you will need to have a responsible adult (18 years or older) with you. Please confirm with your physician that it is acceptable to use public transportation.  Please call the [facility] at [phone] if you have any questions about these instructions.  Surgery Visitation Policy:  Patients undergoing a surgery or procedure may have one family member or support person with them as long as that person is not COVID-19 positive or experiencing its symptoms.  That person may remain in the waiting area during the procedure.  Inpatient  Visitation:    Visiting hours are 7 a.m. to 8 p.m. Inpatients will be allowed two visitors daily. The visitors may change each day during the patient's stay. No visitors under the age of 11. Any visitor under the age of 76 must be accompanied by an adult. The visitor must pass COVID-19 screenings, use hand sanitizer when entering and exiting the patient's room and wear a mask at all times, including in the patient's room. Patients must also wear a mask when staff or their visitor are in the room. Masking is required regardless of vaccination status.  Total Hip/Knee Replacement Preoperative Educational Video  To better prepare for surgery, please view our videos that explain the physical activity and discharge planning required to have the best surgical recovery at [facility].  [email or web address]      Questions? Call [phone] or email [email or web address]

## 2021-04-04 ENCOUNTER — Other Ambulatory Visit
Admission: RE | Admit: 2021-04-04 | Discharge: 2021-04-04 | Disposition: A | Discharge status: Home / Self Care | Payer: Medicare Other | Source: Ambulatory Visit | Attending: Urology | Admitting: Urology

## 2021-04-04 ENCOUNTER — Ambulatory Visit: Payer: Medicare Other | Admitting: Family Medicine

## 2021-04-04 DIAGNOSIS — Z01818 Encounter for other preprocedural examination: Secondary | ICD-10-CM | POA: Diagnosis present

## 2021-04-04 LAB — CBC
HCT: 38.4 % — ABNORMAL LOW (ref 39.0–52.0)
Hemoglobin: 13.2 g/dL (ref 13.0–17.0)
MCH: 25.2 pg — ABNORMAL LOW (ref 26.0–34.0)
MCHC: 34.4 g/dL (ref 30.0–36.0)
MCV: 73.3 fL — ABNORMAL LOW (ref 80.0–100.0)
Platelets: 287 10*3/uL (ref 150–400)
RBC: 5.24 MIL/uL (ref 4.22–5.81)
RDW: 14.6 % (ref 11.5–15.5)
WBC: 8.9 10*3/uL (ref 4.0–10.5)
nRBC: 0 % (ref 0.0–0.2)

## 2021-04-04 NOTE — Progress Notes (Signed)
EKG results of today reviewed by Dr. Andree Elk (anesthesiology); ok to proceed with scheduled surgery.

## 2021-04-05 ENCOUNTER — Ambulatory Visit: Payer: Medicare Other | Admitting: Certified Registered"

## 2021-04-05 ENCOUNTER — Ambulatory Visit
Admission: RE | Admit: 2021-04-05 | Discharge: 2021-04-05 | Disposition: A | Discharge status: Home / Self Care | Payer: Medicare Other | Attending: Urology | Admitting: Urology

## 2021-04-05 ENCOUNTER — Other Ambulatory Visit: Payer: Self-pay

## 2021-04-05 ENCOUNTER — Encounter: Payer: Self-pay | Admitting: Urology

## 2021-04-05 ENCOUNTER — Encounter
Admission: RE | Disposition: A | Discharge status: Home / Self Care | Payer: Self-pay | Source: Home / Self Care | Attending: Urology

## 2021-04-05 DIAGNOSIS — R310 Gross hematuria: Secondary | ICD-10-CM | POA: Diagnosis not present

## 2021-04-05 DIAGNOSIS — Z833 Family history of diabetes mellitus: Secondary | ICD-10-CM | POA: Insufficient documentation

## 2021-04-05 DIAGNOSIS — N183 Chronic kidney disease, stage 3 unspecified: Secondary | ICD-10-CM | POA: Insufficient documentation

## 2021-04-05 DIAGNOSIS — F17210 Nicotine dependence, cigarettes, uncomplicated: Secondary | ICD-10-CM | POA: Diagnosis not present

## 2021-04-05 DIAGNOSIS — Z8249 Family history of ischemic heart disease and other diseases of the circulatory system: Secondary | ICD-10-CM | POA: Insufficient documentation

## 2021-04-05 DIAGNOSIS — Z8042 Family history of malignant neoplasm of prostate: Secondary | ICD-10-CM | POA: Diagnosis not present

## 2021-04-05 DIAGNOSIS — J439 Emphysema, unspecified: Secondary | ICD-10-CM | POA: Insufficient documentation

## 2021-04-05 DIAGNOSIS — D573 Sickle-cell trait: Secondary | ICD-10-CM | POA: Diagnosis not present

## 2021-04-05 DIAGNOSIS — N138 Other obstructive and reflux uropathy: Secondary | ICD-10-CM | POA: Insufficient documentation

## 2021-04-05 DIAGNOSIS — Z809 Family history of malignant neoplasm, unspecified: Secondary | ICD-10-CM | POA: Diagnosis not present

## 2021-04-05 DIAGNOSIS — N401 Enlarged prostate with lower urinary tract symptoms: Secondary | ICD-10-CM | POA: Diagnosis present

## 2021-04-05 DIAGNOSIS — Z8489 Family history of other specified conditions: Secondary | ICD-10-CM | POA: Insufficient documentation

## 2021-04-05 HISTORY — PX: HOLEP-LASER ENUCLEATION OF THE PROSTATE WITH MORCELLATION: SHX6641

## 2021-04-05 SURGERY — ENUCLEATION, PROSTATE, USING LASER, WITH MORCELLATION
Anesthesia: General

## 2021-04-05 MED ORDER — DEXAMETHASONE SODIUM PHOSPHATE 10 MG/ML IJ SOLN
INTRAMUSCULAR | Status: DC | PRN
  Administered 2021-04-05: 10 mg via INTRAVENOUS

## 2021-04-05 MED ORDER — FENTANYL CITRATE (PF) 100 MCG/2ML IJ SOLN
INTRAMUSCULAR | Status: AC
  Filled 2021-04-05: qty 2

## 2021-04-05 MED ORDER — ROCURONIUM BROMIDE 100 MG/10ML IV SOLN
INTRAVENOUS | Status: DC | PRN
  Administered 2021-04-05: 30 mg via INTRAVENOUS

## 2021-04-05 MED ORDER — ONDANSETRON HCL 4 MG/2ML IJ SOLN: 4.0000 mg | Freq: Once | INTRAMUSCULAR | Status: DC | PRN

## 2021-04-05 MED ORDER — PROPOFOL 10 MG/ML IV BOLUS
INTRAVENOUS | Status: DC | PRN
  Administered 2021-04-05: 150 mg via INTRAVENOUS

## 2021-04-05 MED ORDER — CHLORHEXIDINE GLUCONATE 0.12 % MT SOLN: 15.0000 mL | Freq: Once | OROMUCOSAL | Status: AC

## 2021-04-05 MED ORDER — LIDOCAINE HCL (CARDIAC) PF 100 MG/5ML IV SOSY
PREFILLED_SYRINGE | INTRAVENOUS | Status: DC | PRN
  Administered 2021-04-05: 60 mg via INTRAVENOUS

## 2021-04-05 MED ORDER — OXYCODONE HCL 5 MG PO TABS: 5.0000 mg | ORAL_TABLET | Freq: Once | ORAL | Status: DC | PRN

## 2021-04-05 MED ORDER — CHLORHEXIDINE GLUCONATE 0.12 % MT SOLN
OROMUCOSAL | Status: AC
  Administered 2021-04-05: 15 mL via OROMUCOSAL
  Filled 2021-04-05: qty 15

## 2021-04-05 MED ORDER — BELLADONNA ALKALOIDS-OPIUM 16.2-60 MG RE SUPP
RECTAL | Status: DC | PRN
  Administered 2021-04-05: 1 via RECTAL

## 2021-04-05 MED ORDER — DEXMEDETOMIDINE (PRECEDEX) IN NS 20 MCG/5ML (4 MCG/ML) IV SYRINGE
PREFILLED_SYRINGE | INTRAVENOUS | Status: DC | PRN
  Administered 2021-04-05: 4 ug via INTRAVENOUS

## 2021-04-05 MED ORDER — ACETAMINOPHEN 10 MG/ML IV SOLN
INTRAVENOUS | Status: DC | PRN
  Administered 2021-04-05: 1000 mg via INTRAVENOUS

## 2021-04-05 MED ORDER — SODIUM CHLORIDE FLUSH 0.9 % IV SOLN
INTRAVENOUS | Status: AC
  Filled 2021-04-05: qty 10

## 2021-04-05 MED ORDER — HYDROCODONE-ACETAMINOPHEN 5-325 MG PO TABS: 1.0000 | ORAL_TABLET | ORAL | 0 refills | Status: AC | PRN

## 2021-04-05 MED ORDER — SUCCINYLCHOLINE CHLORIDE 20 MG/ML IJ SOLN
INTRAMUSCULAR | Status: DC | PRN
  Administered 2021-04-05: 100 mg via INTRAVENOUS

## 2021-04-05 MED ORDER — CIPROFLOXACIN IN D5W 400 MG/200ML IV SOLN
INTRAVENOUS | Status: AC
  Filled 2021-04-05: qty 200

## 2021-04-05 MED ORDER — FAMOTIDINE 20 MG PO TABS
ORAL_TABLET | ORAL | Status: AC
  Administered 2021-04-05: 20 mg via ORAL
  Filled 2021-04-05: qty 1

## 2021-04-05 MED ORDER — SEVOFLURANE IN SOLN
RESPIRATORY_TRACT | Status: AC
  Filled 2021-04-05: qty 250

## 2021-04-05 MED ORDER — BELLADONNA ALKALOIDS-OPIUM 16.2-60 MG RE SUPP
RECTAL | Status: AC
  Filled 2021-04-05: qty 1

## 2021-04-05 MED ORDER — CIPROFLOXACIN IN D5W 400 MG/200ML IV SOLN
400.0000 mg | INTRAVENOUS | Status: AC
Start: 2021-04-05 — End: 2021-04-05
  Administered 2021-04-05: 400 mg via INTRAVENOUS

## 2021-04-05 MED ORDER — SUGAMMADEX SODIUM 200 MG/2ML IV SOLN
INTRAVENOUS | Status: DC | PRN
  Administered 2021-04-05: 150 mg via INTRAVENOUS

## 2021-04-05 MED ORDER — LACTATED RINGERS IV SOLN: INTRAVENOUS | Status: DC

## 2021-04-05 MED ORDER — ACETAMINOPHEN 10 MG/ML IV SOLN: 1000.0000 mg | Freq: Once | INTRAVENOUS | Status: DC | PRN

## 2021-04-05 MED ORDER — MIDAZOLAM HCL 2 MG/2ML IJ SOLN
INTRAMUSCULAR | Status: DC | PRN
  Administered 2021-04-05: 2 mg via INTRAVENOUS

## 2021-04-05 MED ORDER — OXYCODONE HCL 5 MG/5ML PO SOLN: 5.0000 mg | Freq: Once | ORAL | Status: DC | PRN

## 2021-04-05 MED ORDER — EPHEDRINE SULFATE 50 MG/ML IJ SOLN
INTRAMUSCULAR | Status: DC | PRN
  Administered 2021-04-05 (×2): 10 mg via INTRAVENOUS
  Administered 2021-04-05: 5 mg via INTRAVENOUS

## 2021-04-05 MED ORDER — ORAL CARE MOUTH RINSE: 15.0000 mL | Freq: Once | OROMUCOSAL | Status: AC

## 2021-04-05 MED ORDER — ONDANSETRON HCL 4 MG/2ML IJ SOLN
INTRAMUSCULAR | Status: DC | PRN
  Administered 2021-04-05: 4 mg via INTRAVENOUS

## 2021-04-05 MED ORDER — FENTANYL CITRATE (PF) 100 MCG/2ML IJ SOLN
INTRAMUSCULAR | Status: DC | PRN
  Administered 2021-04-05: 100 ug via INTRAVENOUS

## 2021-04-05 MED ORDER — MIDAZOLAM HCL 2 MG/2ML IJ SOLN
INTRAMUSCULAR | Status: AC
  Filled 2021-04-05: qty 2

## 2021-04-05 MED ORDER — FAMOTIDINE 20 MG PO TABS: 20.0000 mg | ORAL_TABLET | Freq: Once | ORAL | Status: AC

## 2021-04-05 MED ORDER — FENTANYL CITRATE (PF) 100 MCG/2ML IJ SOLN: 25.0000 ug | INTRAMUSCULAR | Status: DC | PRN

## 2021-04-05 SURGICAL SUPPLY — 36 items
ADAPTER IRRIG TUBE 2 SPIKE SOL (ADAPTER) ×4 IMPLANT
ADPR TBG 2 SPK PMP STRL ASCP (ADAPTER) ×2
BAG DRN LRG CPC RND TRDRP CNTR (MISCELLANEOUS) ×1
BAG URO DRAIN 4000ML (MISCELLANEOUS) ×2 IMPLANT
CATH FOLEY 3WAY 30CC 24FR (CATHETERS) ×2
CATH URETL 5X70 OPEN END (CATHETERS) ×2 IMPLANT
CATH URTH STD 24FR FL 3W 2 (CATHETERS) ×1 IMPLANT
CONTAINER COLLECT MORCELLATR (MISCELLANEOUS) ×1 IMPLANT
DRAPE UTILITY 15X26 TOWEL STRL (DRAPES) IMPLANT
ELECT BIVAP BIPO 22/24 DONUT (ELECTROSURGICAL)
ELECTRD BIVAP BIPO 22/24 DONUT (ELECTROSURGICAL) IMPLANT
FIBER LASER FLEXIVA PULSE 550 (Laser) ×1 IMPLANT
FILTER OVERFLOW MORCELLATOR (FILTER) ×1 IMPLANT
GLOVE SURG UNDER POLY LF SZ7.5 (GLOVE) ×2 IMPLANT
GOWN STRL REUS W/ TWL LRG LVL3 (GOWN DISPOSABLE) ×1 IMPLANT
GOWN STRL REUS W/ TWL XL LVL3 (GOWN DISPOSABLE) ×1 IMPLANT
GOWN STRL REUS W/TWL LRG LVL3 (GOWN DISPOSABLE) ×2
GOWN STRL REUS W/TWL XL LVL3 (GOWN DISPOSABLE) ×2
HOLDER FOLEY CATH W/STRAP (MISCELLANEOUS) ×2 IMPLANT
IV NS IRRIG 3000ML ARTHROMATIC (IV SOLUTION) ×15 IMPLANT
KIT TURNOVER CYSTO (KITS) ×2 IMPLANT
MANIFOLD NEPTUNE II (INSTRUMENTS) ×1 IMPLANT
MBRN O SEALING YLW 17 FOR INST (MISCELLANEOUS) ×2
MEMBRANE SLNG YLW 17 FOR INST (MISCELLANEOUS) ×1 IMPLANT
MORCELLATOR COLLECT CONTAINER (MISCELLANEOUS) ×2
MORCELLATOR OVERFLOW FILTER (FILTER) ×2
MORCELLATOR ROTATION 4.75 335 (MISCELLANEOUS) ×2 IMPLANT
PACK CYSTO AR (MISCELLANEOUS) ×2 IMPLANT
SET CYSTO W/LG BORE CLAMP LF (SET/KITS/TRAYS/PACK) ×2 IMPLANT
SET IRRIG Y TYPE TUR BLADDER L (SET/KITS/TRAYS/PACK) ×2 IMPLANT
SLEEVE PROTECTION STRL DISP (MISCELLANEOUS) ×4 IMPLANT
SURGILUBE 2OZ TUBE FLIPTOP (MISCELLANEOUS) ×2 IMPLANT
SYR TOOMEY IRRIG 70ML (MISCELLANEOUS) ×2
SYRINGE TOOMEY IRRIG 70ML (MISCELLANEOUS) ×1 IMPLANT
TUBE PUMP MORCELLATOR PIRANHA (TUBING) ×2 IMPLANT
WATER STERILE IRR 1000ML POUR (IV SOLUTION) ×2 IMPLANT

## 2021-04-05 NOTE — Transfer of Care (Signed)
Immediate Anesthesia Transfer of Care Note  Patient: Manuel [name]  Procedure(s) Performed: HOLEP-LASER ENUCLEATION OF THE PROSTATE WITH MORCELLATION (N/A )  Patient Location: PACU  Anesthesia Type:General  Level of Consciousness: drowsy and patient cooperative  Airway & Oxygen Therapy: Patient Spontanous Breathing  Post-op Assessment: Post -op Vital signs reviewed and stable  Post vital signs: stable  Last Vitals:  Vitals Value Taken Time  BP 144/83 04/05/21 0906  Temp    Pulse 71 04/05/21 0908  Resp 17 04/05/21 0908  SpO2 95 % 04/05/21 0908  Vitals shown include unvalidated device data.  Last Pain:  Vitals:   04/05/21 0628  TempSrc: Oral  PainSc: 0-No pain         Complications: No complications documented.

## 2021-04-05 NOTE — H&P (Signed)
04/05/21 7:08 AM   Manuel [name] November 15, 1955 [id number]  CC: BPH/Gross hematuria  HPI:  Briefly, his urologic history is notable for hematuria and clot retention in 2013 that was managed at [facility] with an emergent TUR of a large median lobe.Per the operative report, the lateral lobes were not resected at that time. He has been followed by Zara Council long-term in our clinic for longstanding history of BPH on maximal medical therapy with Flomax and finasteride, as well as an elevated PSA. He recently was found to have a PSA of 5.6,corrected for finasteride 11.2,and underwent a prostate MRI on 07/25/2019 that showed an 80 g prostate with a PI-RADS 4 lesion in the peripheral zone of the apex. 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  He has developed recurrent gross hematuria with heavy lifting over the last few months, and Shannon appropriately ordered a CT urogram.  I personally viewed and interpreted the CT dated 02/24/2021 that shows enlarged prostate with protrusion into the bladder measuring 80 g, bladder thickness and trabeculation, but no renal masses or filling defects.    PMH: Past Medical History:  Diagnosis Date  . Anemia   . Anxiety   . Aortic atherosclerosis ([place]) 01/11/2018   CT scan Feb 2019  . Asthma   . Bilateral carpal tunnel syndrome   . Bilateral hand numbness   . BPH without urinary obstruction   . Cervical spondylosis without myelopathy   . Chronic kidney disease    STAGE 3  . Complete tear of rotator cuff    bilateral  . COPD (chronic obstructive pulmonary disease) ([place])   . Depression   . Emphysema lung ([place]) 01/11/2018   Chest CT Feb 2019  . History of stomach ulcers   . HLD (hyperlipidemia)   . LVH (left ventricular hypertrophy)   . Personal history of tobacco use, presenting hazards to health 12/21/2015  . Poor dentition   . Sickle cell trait ([place])    . Sleep apnea   . Smoker     Surgical History: Past Surgical History:  Procedure Laterality Date  . CARPAL TUNNEL RELEASE Left   . COLONOSCOPY WITH PROPOFOL N/A 12/25/2015   Procedure: COLONOSCOPY WITH PROPOFOL;  Surgeon: Lucilla Lame, MD;  Location: [facility] ENDOSCOPY;  Service: Endoscopy;  Laterality: N/A;  . CYSTOURETHROSCOPY  08/25/12   with ureteral cathization w/wo retrograde pyelogram  . ROTATOR CUFF REPAIR Right 01/25/15  . SHOULDER ARTHROSCOPY WITH SUBACROMIAL DECOMPRESSION Right 032416  . TRANSURETHRAL RESECTION OF PROSTATE  08/25/12     Family History: Family History  Problem Relation Age of Onset  . Aneurysm Mother   . Hypertension Mother   . Diabetes Father   . Hypertension Father   . Prostate cancer Father   . Lupus Father   . Cancer Maternal Aunt   . Cancer Maternal Uncle   . Diabetes Paternal Uncle   . Heart disease Cousin   . Prostate cancer Paternal Uncle   . COPD Neg Hx   . Stroke Neg Hx   . Kidney disease Neg Hx     Social History:  reports that he has been smoking cigarettes. He has a 17.50 pack-year smoking history. He has never used smokeless tobacco. He reports that he does not drink alcohol and does not use drugs.  Physical Exam: BP (!) 143/85   Pulse (!) 58   Temp 97.7 F (36.5 C) (Oral)   Resp  18   Ht 5\' 10"  (1.778 m)   Wt 72.6 kg   SpO2 100%   BMI 22.96 kg/m    Constitutional:  Alert and oriented, No acute distress. Cardiovascular: regular rate and rhythm Respiratory: CTA bilaterally GI: Abdomen is soft, nontender, nondistended, no abdominal masses   Laboratory Data: Urine culture 5/23 no growth   Assessment & Plan:   65 year old male with BPH, urinary symptoms, and refractory hematuria, history of emergent partial TUR at [facility] in 2013 for hematuria and clot retention, elevated PSA with recent negative MRI fusion biopsy here today for HOLEP.  We discussed options include continuing maximal medical therapy with Flomax and  finasteride, with high risk for recurrent gross hematuria and likely clot retention, as well as persistent urinary symptoms.  Other alternatives would include surgical intervention with HOLEP.  He would be able to discontinue both Flomax and finasteride post procedure.  We also discussed possibility of additional pathology found at the time of surgery.  We discussed the risks and benefits of HoLEP at length.  The procedure requires general anesthesia and takes 2 to 3 hours, and a holmium laser is used to enucleate the prostate and push this tissue into the bladder.  A morcellator is then used to remove this tissue, which is sent for pathology.  The vast majority of patients are able to discharge the same day with a catheter in place for 2 to 3 days, and will follow-up in clinic for a voiding trial.  Approximately 5% of patients will be admitted overnight to monitor the urine, or if they have multiple co-morbidities.  We specifically discussed the risks of bleeding, infection, retrograde ejaculation, temporary urgency and urge incontinence, very low risk of long-term incontinence, pathologic evaluation of prostate tissue and possible detection of prostate cancer or other malignancy, and possible need for additional procedures.  HOLEP today  Nickolas Madrid, MD 04/05/2021  [facility] [address] [place], [place] [address] [phone]

## 2021-04-05 NOTE — Anesthesia Procedure Notes (Signed)
Performed by: Biagio Borg, CRNA

## 2021-04-05 NOTE — Anesthesia Procedure Notes (Signed)
Procedure Name: Intubation Date/Time: 04/05/2021 7:33 AM Performed by: Biagio Borg, CRNA Pre-anesthesia Checklist: Patient identified, Emergency Drugs available, Suction available and Patient being monitored Patient Re-evaluated:Patient Re-evaluated prior to induction Oxygen Delivery Method: Circle system utilized Preoxygenation: Pre-oxygenation with 100% oxygen Induction Type: IV induction Ventilation: Mask ventilation without difficulty Laryngoscope Size: McGraph and 4 Grade View: Grade I Tube type: Oral Number of attempts: 1 Airway Equipment and Method: Stylet Placement Confirmation: ETT inserted through vocal cords under direct vision,  positive ETCO2 and breath sounds checked- equal and bilateral Secured at: 21 cm Tube secured with: Tape Dental Injury: Teeth and Oropharynx as per pre-operative assessment

## 2021-04-05 NOTE — Discharge Instructions (Signed)
Indwelling Urinary Catheter Care, Adult An indwelling urinary catheter is a thin tube that is put into your bladder. The tube helps to drain pee (urine) out of your body. The tube goes in through your urethra. Your urethra is where pee comes out of your body. Your pee will come out through the catheter, then it will go into a bag (drainage bag). Take good care of your catheter so it will work well. How to wear your catheter and bag Supplies needed  Sticky tape (adhesive tape) or a leg strap.  Alcohol wipe or soap and water (if you use tape).  A clean towel (if you use tape).  Large overnight bag.  Smaller bag (leg bag). Wearing your catheter Attach your catheter to your leg with tape or a leg strap.  Make sure the catheter is not pulled tight.  If a leg strap gets wet, take it off and put on a dry strap.  If you use tape to hold the bag on your leg: 1. Use an alcohol wipe or soap and water to wash your skin where the tape made it sticky before. 2. Use a clean towel to pat-dry that skin. 3. Use new tape to make the bag stay on your leg. Wearing your bags You should have been given a large overnight bag.  You may wear the overnight bag in the day or night.  Always have the overnight bag lower than your bladder.  Do not let the bag touch the floor.  Before you go to sleep, put a clean plastic bag in a wastebasket. Then hang the overnight bag inside the wastebasket. You should also have a smaller leg bag that fits under your clothes.  Always wear the leg bag below your knee.  Do not wear your leg bag at night. How to care for your skin and catheter Supplies needed  A clean washcloth.  Water and mild soap.  A clean towel. Caring for your skin and catheter  Clean the skin around your catheter every day: 1. Wash your hands with soap and water. 2. Wet a clean washcloth in warm water and mild soap. 3. Clean the skin around your urethra.  If you are male:  Gently  spread the folds of skin around your vagina (labia).  With the washcloth in your other hand, wipe the inner side of your labia on each side. Wipe from front to back.  If you are male:  Pull back any skin that covers the end of your penis (foreskin).  With the washcloth in your other hand, wipe your penis in small circles. Start wiping at the tip of your penis, then move away from the catheter.  Move the foreskin back in place, if needed. 4. With your free hand, hold the catheter close to where it goes into your body.  Keep holding the catheter during cleaning so it does not get pulled out. 5. With the washcloth in your other hand, clean the catheter.  Only wipe downward on the catheter.  Do not wipe upward toward your body. Doing this may push germs into your urethra and cause infection. 6. Use a clean towel to pat-dry the catheter and the skin around it. Make sure to wipe off all soap. 7. Wash your hands with soap and water.  Shower every day. Do not take baths.  Do not use cream, ointment, or lotion on the area where the catheter goes into your body, unless your doctor tells you to.  Do not   use powders, sprays, or lotions on your genital area.  Check your skin around the catheter every day for signs of infection. Check for: ? Redness, swelling, or pain. ? Fluid or blood. ? Warmth. ? Pus or a bad smell.      How to empty the bag Supplies needed  Rubbing alcohol.  Gauze pad or cotton ball.  Tape or a leg strap. Emptying the bag Pour the pee out of your bag when it is ?- full, or at least 2-3 times a day. Do this for your overnight bag and your leg bag. 1. Wash your hands with soap and water. 2. Separate (detach) the bag from your leg. 3. Hold the bag over the toilet or a clean pail. Keep the bag lower than your hips and bladder. This is so the pee (urine) does not go back into the tube. 4. Open the pour spout. It is at the bottom of the bag. 5. Empty the pee into the  toilet or pail. Do not let the pour spout touch any surface. 6. Put rubbing alcohol on a gauze pad or cotton ball. 7. Use the gauze pad or cotton ball to clean the pour spout. 8. Close the pour spout. 9. Attach the bag to your leg with tape or a leg strap. 10. Wash your hands with soap and water. Follow instructions for cleaning the drainage bag:  From the product maker.  As told by your doctor. How to change the bag Supplies needed  Alcohol wipes.  A clean bag.  Tape or a leg strap. Changing the bag Replace your bag when it starts to leak, smell bad, or look dirty. 1. Wash your hands with soap and water. 2. Separate the dirty bag from your leg. 3. Pinch the catheter with your fingers so that pee does not spill out. 4. Separate the catheter tube from the bag tube where these tubes connect (at the connection valve). Do not let the tubes touch any surface. 5. Clean the end of the catheter tube with an alcohol wipe. Use a different alcohol wipe to clean the end of the bag tube. 6. Connect the catheter tube to the tube of the clean bag. 7. Attach the clean bag to your leg with tape or a leg strap. Do not make the bag tight on your leg. 8. Wash your hands with soap and water. General rules  Never pull on your catheter. Never try to take it out. Doing that can hurt you.  Always wash your hands before and after you touch your catheter or bag. Use a mild, fragrance-free soap. If you do not have soap and water, use hand sanitizer.  Always make sure there are no twists or bends (kinks) in the catheter tube.  Always make sure there are no leaks in the catheter or bag.  Drink enough fluid to keep your pee pale yellow.  Do not take baths, swim, or use a hot tub.  If you are male, wipe from front to back after you poop (have a bowel movement).   Contact a doctor if:  Your pee is cloudy.  Your pee smells worse than usual.  Your catheter gets clogged.  Your catheter  leaks.  Your bladder feels full. Get help right away if:  You have redness, swelling, or pain where the catheter goes into your body.  You have fluid, blood, pus, or a bad smell coming from the area where the catheter goes into your body.  Your skin feels   warm where the catheter goes into your body.  You have a fever.  You have pain in your: ? Belly (abdomen). ? Legs. ? Lower back. ? Bladder.  You see blood in the catheter.  Your pee is pink or red.  You feel sick to your stomach (nauseous).  You throw up (vomit).  You have chills.  Your pee is not draining into the bag.  Your catheter gets pulled out. Summary  An indwelling urinary catheter is a thin tube that is placed into the bladder to help drain pee (urine) out of the body.  The catheter is placed into the part of the body that drains pee from the bladder (urethra).  Taking good care of your catheter will keep it working properly and help prevent problems.  Always wash your hands before and after touching your catheter or bag.  Never pull on your catheter or try to take it out. This information is not intended to replace advice given to you by your health care provider. Make sure you discuss any questions you have with your health care provider. Document Revised: 02/11/2019 Document Reviewed: 06/05/2017 Elsevier Patient Education  2021 [employer] Inc.   AMBULATORY SURGERY  DISCHARGE INSTRUCTIONS   1) The drugs that you were given will stay in your system until tomorrow so for the next 24 hours you should not:  A) Drive an automobile B) Make any legal decisions C) Drink any alcoholic beverage   2) You may resume regular meals tomorrow.  Today it is better to start with liquids and gradually work up to solid foods.  You may eat anything you prefer, but it is better to start with liquids, then soup and crackers, and gradually work up to solid foods.   3) Please notify your doctor immediately if you  have any unusual bleeding, trouble breathing, redness and pain at the surgery site, drainage, fever, or pain not relieved by medication.    4) Additional Instructions:        Please contact your physician with any problems or Same Day Surgery at [phone], Monday through Friday 6 am to 4 pm, or [place] [facility] [place] Main number at [phone]. 

## 2021-04-05 NOTE — Anesthesia Preprocedure Evaluation (Signed)
Anesthesia Evaluation  Patient identified by MRN, date of birth, ID band Patient awake    Reviewed: Allergy & Precautions, H&P , NPO status , Patient's Chart, lab work & pertinent test results  History of Anesthesia Complications Negative for: history of anesthetic complications  Airway Mallampati: III  TM Distance: >3 FB Neck ROM: limited    Dental  (+) Partial Lower, Partial Upper   Pulmonary neg shortness of breath, asthma , neg sleep apnea, COPD, Current Smoker and Patient abstained from smoking.,    Pulmonary exam normal breath sounds clear to auscultation       Cardiovascular Exercise Tolerance: Good (-) angina(-) Past MI and (-) DOE Normal cardiovascular exam Rhythm:regular Rate:Normal     Neuro/Psych  Headaches, PSYCHIATRIC DISORDERS Anxiety Depression  Neuromuscular disease    GI/Hepatic negative GI ROS, Neg liver ROS,   Endo/Other  negative endocrine ROS  Renal/GU Renal disease  negative genitourinary   Musculoskeletal  (+) Arthritis ,   Abdominal   Peds  Hematology negative hematology ROS (+)   Anesthesia Other Findings Past Medical History:   Anemia                                                       Depression                                                   Sickle cell trait (H[facility])                                      Bilateral hand numbness                                      Cervical spondylosis without myelopathy                      Complete tear of rotator cuff                                  Comment:bilateral   LVH (left ventricular hypertrophy)                           BPH without urinary obstruction                              COPD (chronic obstructive pulmonary disease) (*              Smoker                                                       Poor dentition  Bilateral carpal tunnel syndrome                             Anxiety                                                       Asthma                                                       HLD (hyperlipidemia)                                         Sleep apnea                                                  History of stomach ulcers                                    Personal history of tobacco use, presenting ha* 12/21/2015   Past Surgical History:   CYSTOURETHROSCOPY                                08/25/12       Comment:with ureteral cathization w/wo retrograde               pyelogram   TRANSURETHRAL RESECTION OF PROSTATE              08/25/12     ROTATOR CUFF REPAIR                             Right 01/25/15      SHOULDER ARTHROSCOPY WITH SUBACROMIAL DECOMPRE* Right 032416      BMI    Body Mass Index   23.61 kg/m 2      Reproductive/Obstetrics negative OB ROS                             Anesthesia Physical  Anesthesia Plan  ASA: III  Anesthesia Plan: General   Post-op Pain Management:    Induction: Intravenous  PONV Risk Score and Plan: 2 and Ondansetron, Dexamethasone and Treatment may vary due to age or medical condition  Airway Management Planned: Oral ETT  Additional Equipment: None  Intra-op Plan:   Post-operative Plan: Extubation in OR  Informed Consent: I have reviewed the patients History and Physical, chart, labs and discussed the procedure including the risks, benefits and alternatives for the proposed anesthesia with the patient or authorized representative who has indicated his/her understanding and acceptance.     Dental Advisory Given  Plan Discussed with: Anesthesiologist, CRNA and Surgeon  Anesthesia Plan Comments: (Discussed risks of anesthesia with patient, including PONV,  sore throat, lip/dental damage. Rare risks discussed as well, such as cardiorespiratory and neurological sequelae. Patient understands.)        Anesthesia Quick Evaluation

## 2021-04-05 NOTE — Op Note (Signed)
Date of procedure: 04/05/21  Preoperative diagnosis:  1. BPH with obstruction 2. Recurrent gross hematuria  Postoperative diagnosis:  1. Same  Procedure: 1. HoLEP (Holmium Laser Enucleation of the Prostate)  Surgeon: Nickolas Madrid, MD  Anesthesia: General  Complications: None  Intraoperative findings:  1.  Mild bladder trabeculations, no suspicious lesions, friable prostate with lateral lobe hypertrophy 2.  Ureteral orifices and verumontanum intact at conclusion of case  EBL: Minimal  Specimens: Prostate chips  Enucleation time: 24 minutes  Morcellation time: 32 minutes (rubbery challenging prostate tissue to morcellate)  Intra-op weight: 57 g  Drains: 24 French three-way, 60 cc in balloon  Indication: Faraz Ponciano is a 65 y.o. patient with long history of BPH and recurrent gross hematuria as well as urinary symptoms.  After reviewing the management options for treatment, they elected to proceed with the above surgical procedure(s). We have discussed the potential benefits and risks of the procedure, side effects of the proposed treatment, the likelihood of the patient achieving the goals of the procedure, and any potential problems that might occur during the procedure or recuperation.  We specifically discussed the risks of bleeding, infection, hematuria and clot retention, need for additional procedures, possible overnight hospital stay, temporary urgency and incontinence, rare long-term incontinence, and retrograde ejaculation.  Informed consent has been obtained.   Description of procedure:  The patient was taken to the operating room and general anesthesia was induced.  The patient was placed in the dorsal lithotomy position, prepped and draped in the usual sterile fashion, and preoperative antibiotics(Cipro) were administered.  SCDs were placed for DVT prophylaxis.  A preoperative time-out was performed.   Leander Rams sounds were used to gently dilated the urethra up  to 19F. The 59 French continuous flow resectoscope was inserted into the urethra using the visual obturator  The prostate was large with obstructing lateral lobes and friable tissue at the bladder neck. The bladder was thoroughly inspected and notable for mild to moderate trabeculations but no suspicious lesions.  The ureteral orifices were located in orthotopic position.  The laser was set to 2 J and 50 Hz and was used to make a lambda incision just proximal to the verumontanum down to the level of the capsule.  A 6 o'clock incision was then made down to the level of the capsule from the bladder neck to the verumontanum.  The lateral lobes were then incised circumferentially until they were disconnected from the surrounding tissue.  The capsule was examined and laser was used for meticulous hemostasis.    The 21 French resectoscope was then switched out for the 43 French nephroscope and the lobes were morcellated and the tissue sent to pathology.  A 24 French three-way catheter was inserted easily, and 60 cc were placed in the balloon.  Urine was clear.  The catheter irrigated easily with a Toomey syringe.  CBI was initiated. A belladonna suppository was placed.  The patient tolerated the procedure well without any immediate complications and was extubated and transferred to the recovery room in stable condition.  Urine was clear on fast CBI.  Disposition: Stable to PACU  Plan: Wean CBI in PACU, anticipate discharge home today with void trial in clinic in 2-3 days   Nickolas Madrid, MD 04/05/2021

## 2021-04-05 NOTE — Anesthesia Postprocedure Evaluation (Signed)
Anesthesia Post Note  Patient: Manuel [name]  Procedure(s) Performed: HOLEP-LASER ENUCLEATION OF THE PROSTATE WITH MORCELLATION (N/A )  Patient location during evaluation: PACU Anesthesia Type: General Level of consciousness: awake and alert Pain management: pain level controlled Vital Signs Assessment: post-procedure vital signs reviewed and stable Respiratory status: spontaneous breathing, nonlabored ventilation, respiratory function stable and patient connected to nasal cannula oxygen Cardiovascular status: blood pressure returned to baseline and stable Postop Assessment: no apparent nausea or vomiting Anesthetic complications: no   No complications documented.   Last Vitals:  Vitals:   04/05/21 0948 04/05/21 1012  BP: (!) 161/79 138/75  Pulse: 60 (!) 58  Resp: 16 16  Temp: (!) 35.8 C   SpO2: 99% 100%    Last Pain:  Vitals:   04/05/21 1012  TempSrc:   PainSc: 0-No pain                 Arita Miss

## 2021-04-08 ENCOUNTER — Other Ambulatory Visit: Payer: Self-pay

## 2021-04-08 ENCOUNTER — Encounter: Payer: Self-pay | Admitting: Physician Assistant

## 2021-04-08 ENCOUNTER — Ambulatory Visit (INDEPENDENT_AMBULATORY_CARE_PROVIDER_SITE_OTHER): Payer: Medicare Other | Admitting: Physician Assistant

## 2021-04-08 ENCOUNTER — Ambulatory Visit: Payer: Medicare Other | Admitting: Physician Assistant

## 2021-04-08 VITALS — BP 134/82 | HR 69 | Ht 70.0 in | Wt 160.0 lb

## 2021-04-08 DIAGNOSIS — N401 Enlarged prostate with lower urinary tract symptoms: Secondary | ICD-10-CM | POA: Diagnosis not present

## 2021-04-08 DIAGNOSIS — N138 Other obstructive and reflux uropathy: Secondary | ICD-10-CM

## 2021-04-08 LAB — BLADDER SCAN AMB NON-IMAGING: Scan Result: 10

## 2021-04-08 NOTE — Patient Instructions (Addendum)
Congratulations on your recent HOLEP procedure! As discussed in clinic today, there are three main side effects that commonly occur after surgery: 1. Burning or pain with urination: This typically resolves within 1 week of surgery. If you are still having significant pain with urination 10 days after surgery, please call our clinic. We may need to check you for a urinary tract infection at that point, though this is rare. 2. Blood in the urine: This may come and go, but typically resolves completely within 3 weeks of surgery. If you are on blood thinners, it may take longer for the bleeding to resolve. As long as your urine remains thin and runny and you are not passing large clots (around the size of your palm), this is a normal postoperative finding. If you start to pass dark red urine or thick, ketchup-like urine, please call our office immediately. 3. Urinary leakage or urgency: This tends to improve with time, with most patients becoming dry within around 3 months of surgery. You may wear absorbant underwear or liners for security during this time. To help you get dry faster, please make sure you are completing your Kegel exercises as instructed, with a set of 10 exercises completed up to three times daily.   Kegel Exercises  Kegel exercises can help strengthen your pelvic floor muscles. The pelvic floor is a group of muscles that support your rectum, small intestine, and bladder. In females, pelvic floor muscles also help support the womb (uterus). These muscles help you control the flow of urine and stool. Kegel exercises are painless and simple, and they do not require any equipment. Your provider may suggest Kegel exercises to:  Improve bladder and bowel control.  Improve sexual response.  Improve weak pelvic floor muscles after surgery to remove the uterus (hysterectomy) or pregnancy (females).  Improve weak pelvic floor muscles after prostate gland removal or surgery (males). Kegel  exercises involve squeezing your pelvic floor muscles, which are the same muscles you squeeze when you try to stop the flow of urine or keep from passing gas. The exercises can be done while sitting, standing, or lying down, but it is best to vary your position. Exercises How to do Kegel exercises: 1. Squeeze your pelvic floor muscles tight. You should feel a tight lift in your rectal area. If you are a male, you should also feel a tightness in your vaginal area. Keep your stomach, buttocks, and legs relaxed. 2. Hold the muscles tight for up to 10 seconds. 3. Breathe normally. 4. Relax your muscles. 5. Repeat as told by your health care provider. Repeat this exercise daily as told by your health care provider. Continue to do this exercise for at least 4-6 weeks, or for as long as told by your health care provider. You may be referred to a physical therapist who can help you learn more about how to do Kegel exercises. Depending on your condition, your health care provider may recommend:  Varying how long you squeeze your muscles.  Doing several sets of exercises every day.  Doing exercises for several weeks.  Making Kegel exercises a part of your regular exercise routine. This information is not intended to replace advice given to you by your health care provider. Make sure you discuss any questions you have with your health care provider. Document Revised: 02/24/2020 Document Reviewed: 06/09/2018 Elsevier Patient Education  2021 Elsevier Inc.  

## 2021-04-08 NOTE — Progress Notes (Signed)
Afternoon follow-up  Patient returned to clinic this afternoon for repeat PVR. He reports drinking a large volume of fluid. He has been able to urinate. He has had mild urinary dribbling. PVR 59mL.  Results for orders placed or performed in visit on 04/08/21  BLADDER SCAN AMB NON-IMAGING  Result Value Ref Range   Scan Result 10     Voiding trial passed. Counseled patient on normal postoperative findings including dysuria, gross hematuria, and urinary leakage.  Counseled him to start Kegel exercises 3x10 sets daily and provided verbal and written instructions on these.  Counseled him to resume his normal fluid intake.  Surgical pathology pending, will defer to Dr. Diamantina Providence to communicate results when they are available.  Follow up: 10 week postop with Dr. Diamantina Providence

## 2021-04-08 NOTE — Progress Notes (Signed)
Catheter Removal  Patient is present today for a catheter removal.  21ml of water was drained from the balloon. A 24FR three way foley cath was removed from the bladder no complications were noted . Patient tolerated well.  Performed by: Debroah Loop, PA-C   Follow up/ Additional notes: Push fluids and RTC this afternoon for PVR.

## 2021-04-10 LAB — SURGICAL PATHOLOGY

## 2021-04-11 ENCOUNTER — Encounter: Payer: Self-pay | Admitting: Unknown Physician Specialty

## 2021-04-11 ENCOUNTER — Other Ambulatory Visit: Payer: Self-pay

## 2021-04-11 ENCOUNTER — Ambulatory Visit (INDEPENDENT_AMBULATORY_CARE_PROVIDER_SITE_OTHER): Payer: Medicare Other | Admitting: Unknown Physician Specialty

## 2021-04-11 VITALS — BP 156/84 | HR 74 | Temp 98.3°F | Resp 18 | Ht 70.0 in | Wt 165.8 lb

## 2021-04-11 DIAGNOSIS — F3342 Major depressive disorder, recurrent, in full remission: Secondary | ICD-10-CM | POA: Diagnosis not present

## 2021-04-11 DIAGNOSIS — F17200 Nicotine dependence, unspecified, uncomplicated: Secondary | ICD-10-CM

## 2021-04-11 DIAGNOSIS — M47812 Spondylosis without myelopathy or radiculopathy, cervical region: Secondary | ICD-10-CM

## 2021-04-11 DIAGNOSIS — N400 Enlarged prostate without lower urinary tract symptoms: Secondary | ICD-10-CM

## 2021-04-11 DIAGNOSIS — Z1211 Encounter for screening for malignant neoplasm of colon: Secondary | ICD-10-CM | POA: Diagnosis not present

## 2021-04-11 DIAGNOSIS — E782 Mixed hyperlipidemia: Secondary | ICD-10-CM | POA: Diagnosis not present

## 2021-04-11 DIAGNOSIS — N1831 Chronic kidney disease, stage 3a: Secondary | ICD-10-CM

## 2021-04-11 LAB — CBC WITH DIFFERENTIAL/PLATELET
Absolute Monocytes: 603 cells/uL (ref 200–950)
Basophils Absolute: 40 cells/uL (ref 0–200)
Basophils Relative: 0.6 %
Eosinophils Absolute: 315 cells/uL (ref 15–500)
Eosinophils Relative: 4.7 %
HCT: 39.9 % (ref 38.5–50.0)
Hemoglobin: 13 g/dL — ABNORMAL LOW (ref 13.2–17.1)
Lymphs Abs: 1561 cells/uL (ref 850–3900)
MCH: 24.8 pg — ABNORMAL LOW (ref 27.0–33.0)
MCHC: 32.6 g/dL (ref 32.0–36.0)
MCV: 76.1 fL — ABNORMAL LOW (ref 80.0–100.0)
MPV: 9.6 fL (ref 7.5–12.5)
Monocytes Relative: 9 %
Neutro Abs: 4181 cells/uL (ref 1500–7800)
Neutrophils Relative %: 62.4 %
Platelets: 281 10*3/uL (ref 140–400)
RBC: 5.24 10*6/uL (ref 4.20–5.80)
RDW: 14.8 % (ref 11.0–15.0)
Total Lymphocyte: 23.3 %
WBC: 6.7 10*3/uL (ref 3.8–10.8)

## 2021-04-11 LAB — COMPREHENSIVE METABOLIC PANEL
AG Ratio: 1.3 (calc) (ref 1.0–2.5)
ALT: 7 U/L — ABNORMAL LOW (ref 9–46)
AST: 9 U/L — ABNORMAL LOW (ref 10–35)
Albumin: 3.5 g/dL — ABNORMAL LOW (ref 3.6–5.1)
Alkaline phosphatase (APISO): 68 U/L (ref 35–144)
BUN: 10 mg/dL (ref 7–25)
CO2: 32 mmol/L (ref 20–32)
Calcium: 9.1 mg/dL (ref 8.6–10.3)
Chloride: 103 mmol/L (ref 98–110)
Creat: 1.16 mg/dL (ref 0.70–1.25)
Globulin: 2.8 g/dL (calc) (ref 1.9–3.7)
Glucose, Bld: 80 mg/dL (ref 65–99)
Potassium: 4.1 mmol/L (ref 3.5–5.3)
Sodium: 142 mmol/L (ref 135–146)
Total Bilirubin: 0.3 mg/dL (ref 0.2–1.2)
Total Protein: 6.3 g/dL (ref 6.1–8.1)

## 2021-04-11 LAB — LIPID PANEL
Cholesterol: 206 mg/dL — ABNORMAL HIGH (ref <200)
HDL: 46 mg/dL (ref >=40)
LDL Cholesterol (Calc): 135 mg/dL (calc) — ABNORMAL HIGH
Non-HDL Cholesterol (Calc): 160 mg/dL (calc) — ABNORMAL HIGH (ref <130)
Total CHOL/HDL Ratio: 4.5 (calc) (ref <5.0)
Triglycerides: 130 mg/dL (ref <150)

## 2021-04-11 NOTE — Assessment & Plan Note (Signed)
Not taking BPH meds since surgery

## 2021-04-11 NOTE — Assessment & Plan Note (Signed)
Chronic problem without changes.  MRI previously showed pinched nerve.  Discussed risk benefits to MRI and will defer to specialty services.  I will refer at pt request

## 2021-04-11 NOTE — Assessment & Plan Note (Addendum)
Not taking cholesterol medications.  Would like to have it checked.  Willing to restart if high

## 2021-04-11 NOTE — Progress Notes (Signed)
BP (!) 156/84   Pulse 74   Temp 98.3 F (36.8 C) (Oral)   Resp 18   Ht 5\' 10"  (1.778 m)   Wt 165 lb 12.8 oz (75.2 kg)   SpO2 97%   BMI 23.79 kg/m    Subjective:    Patient ID: Manuel [name], male    DOB: [birth year], 65 y.o.   MRN: [id number]  HPI: Manuel [name] is a 65 y.o. male  Chief Complaint  Patient presents with   Hyperlipidemia    Follow up   Follow-up   Hypertension Using medications without difficulty Average home BPs: Not checking   No problems or lightheadedness No chest pain with exertion or shortness of breath No Edema   Hyperlipidemia Not taking cholesterol meds No Muscle aches  Diet compliance: Watches what he eats.   Exercise:Working in yard  [facility].  Had surgery and off his prostate medications.    Neck pain Chronic and unchanged.  Sharp shooting pain in some positions  Depression Has a lot of stressors with caring for his wife.    Depression screen [facility] 2/9 04/11/2021 10/04/2020 06/04/2020 12/27/2019 05/25/2019  Decreased Interest 0 0 1 0 0  Down, Depressed, Hopeless 0 0 1 0 0  PHQ - 2 Score 0 0 2 0 0  Altered sleeping - 3 3 0 0  Tired, decreased energy - 1 1 0 0  Change in appetite - 0 0 0 0  Feeling bad or failure about yourself  - 0 0 0 0  Trouble concentrating - 0 1 0 0  Moving slowly or fidgety/restless - 0 0 0 0  Suicidal thoughts - 0 0 0 0  PHQ-9 Score - 4 7 0 0  Difficult doing work/chores - Somewhat difficult Somewhat difficult Not difficult at all Not difficult at all  Some recent data might be hidden   Rotator cuff Following with Orthopedics.  Recent injection   Relevant past medical, surgical, family and social history reviewed and updated as indicated. Interim medical history since our last visit reviewed. Allergies and medications reviewed and updated.    Per HPI unless specifically indicated above     Objective:    BP (!) 156/84   Pulse 74   Temp 98.3 F (36.8 C) (Oral)   Resp 18   Ht 5\' 10"   (1.778 m)   Wt 165 lb 12.8 oz (75.2 kg)   SpO2 97%   BMI 23.79 kg/m   Wt Readings from Last 3 Encounters:  04/11/21 165 lb 12.8 oz (75.2 kg)  04/08/21 160 lb (72.6 kg)  04/05/21 160 lb (72.6 kg)    SBP down to 130 after sitting for a period of time.    Physical Exam Constitutional:      General: He is not in acute distress.    Appearance: Normal appearance. He is well-developed.  HENT:     Head: Normocephalic and atraumatic.  Eyes:     General: Lids are normal. No scleral icterus.       Right eye: No discharge.        Left eye: No discharge.     Conjunctiva/sclera: Conjunctivae normal.  Neck:     Vascular: No carotid bruit or JVD.  Cardiovascular:     Rate and Rhythm: Normal rate and regular rhythm.     Heart sounds: Normal heart sounds.  Pulmonary:     Effort: Pulmonary effort is normal. No respiratory distress.     Breath sounds: Normal  breath sounds.  Abdominal:     Palpations: There is no hepatomegaly or splenomegaly.  Musculoskeletal:        General: Normal range of motion.     Cervical back: Normal range of motion and neck supple.  Skin:    General: Skin is warm and dry.     Coloration: Skin is not pale.     Findings: No rash.  Neurological:     Mental Status: He is alert and oriented to person, place, and time.  Psychiatric:        Behavior: Behavior normal.        Thought Content: Thought content normal.        Judgment: Judgment normal.    Results for orders placed or performed in visit on 04/08/21  BLADDER SCAN AMB NON-IMAGING  Result Value Ref Range   Scan Result 10       Assessment & Plan:   Problem List Items Addressed This Visit       Unprioritized   Hyperlipidemia (Chronic)    Not taking cholesterol medications.  Would like to have it checked.  Willing to restart if high       Relevant Orders   Comprehensive metabolic panel   Lipid panel   BPH (benign prostatic hyperplasia)    Not taking BPH meds since surgery       Cervical  spondylosis without myelopathy    Chronic problem without changes.  MRI previously showed pinched nerve.  Discussed risk benefits to MRI and will defer to specialty services.  I will refer at pt request       Chronic kidney disease, stage III (moderate) ([place])    ? Post renal.  Recent prostate surgery and recheck CMP       Relevant Orders   Comprehensive metabolic panel   CBC with Differential/Platelet   Current smoker    Taking OTC nicotine replacement.  He has cut back but not willing to quit at this time       MDD (major depressive disorder), recurrent, in full remission ([place])    Currently taking Wellbutrin.  Discussed counseling and refusing at this time.  Referring wife to social worker to help with resources and consult at that time with pt for caregiver stress       Other Visit Diagnoses     Encounter for screening colonoscopy    -  Primary   Relevant Orders   Ambulatory referral to Gastroenterology        Follow up plan: Return in about 6 months (around 10/11/2021).

## 2021-04-11 NOTE — Assessment & Plan Note (Addendum)
Taking OTC nicotine replacement.  He has cut back but not willing to quit at this time

## 2021-04-11 NOTE — Assessment & Plan Note (Addendum)
Currently taking Wellbutrin.  Discussed counseling and refusing at this time.  Referring wife to social worker to help with resources and consult at that time with pt for caregiver stress

## 2021-04-11 NOTE — Assessment & Plan Note (Signed)
?   Post renal.  Recent prostate surgery and recheck CMP

## 2021-04-15 ENCOUNTER — Other Ambulatory Visit: Payer: Self-pay

## 2021-04-15 DIAGNOSIS — Z86010 Personal history of colonic polyps: Secondary | ICD-10-CM

## 2021-04-15 MED ORDER — SUPREP BOWEL PREP KIT 17.5-3.13-1.6 GM/177ML PO SOLN: 1.0000 | ORAL | 0 refills | Status: DC

## 2021-04-18 ENCOUNTER — Encounter: Payer: Self-pay | Admitting: Gastroenterology

## 2021-05-02 ENCOUNTER — Ambulatory Visit
Admission: RE | Admit: 2021-05-02 | Discharge: 2021-05-02 | Disposition: A | Discharge status: Home / Self Care | Payer: Medicare Other | Attending: Gastroenterology | Admitting: Gastroenterology

## 2021-05-02 ENCOUNTER — Ambulatory Visit: Payer: Medicare Other | Admitting: Anesthesiology

## 2021-05-02 ENCOUNTER — Encounter
Admission: RE | Disposition: A | Discharge status: Home / Self Care | Payer: Self-pay | Source: Home / Self Care | Attending: Gastroenterology

## 2021-05-02 ENCOUNTER — Encounter: Payer: Self-pay | Admitting: Gastroenterology

## 2021-05-02 ENCOUNTER — Other Ambulatory Visit: Payer: Self-pay

## 2021-05-02 DIAGNOSIS — N183 Chronic kidney disease, stage 3 unspecified: Secondary | ICD-10-CM | POA: Insufficient documentation

## 2021-05-02 DIAGNOSIS — D123 Benign neoplasm of transverse colon: Secondary | ICD-10-CM | POA: Diagnosis not present

## 2021-05-02 DIAGNOSIS — Z860100 Personal history of colon polyps, unspecified: Secondary | ICD-10-CM

## 2021-05-02 DIAGNOSIS — Z8042 Family history of malignant neoplasm of prostate: Secondary | ICD-10-CM | POA: Insufficient documentation

## 2021-05-02 DIAGNOSIS — D573 Sickle-cell trait: Secondary | ICD-10-CM | POA: Diagnosis not present

## 2021-05-02 DIAGNOSIS — Z1211 Encounter for screening for malignant neoplasm of colon: Secondary | ICD-10-CM | POA: Diagnosis not present

## 2021-05-02 DIAGNOSIS — Z79899 Other long term (current) drug therapy: Secondary | ICD-10-CM | POA: Insufficient documentation

## 2021-05-02 DIAGNOSIS — F17210 Nicotine dependence, cigarettes, uncomplicated: Secondary | ICD-10-CM | POA: Insufficient documentation

## 2021-05-02 DIAGNOSIS — Z809 Family history of malignant neoplasm, unspecified: Secondary | ICD-10-CM | POA: Insufficient documentation

## 2021-05-02 DIAGNOSIS — Z880 Allergy status to penicillin: Secondary | ICD-10-CM | POA: Insufficient documentation

## 2021-05-02 DIAGNOSIS — D125 Benign neoplasm of sigmoid colon: Secondary | ICD-10-CM | POA: Diagnosis not present

## 2021-05-02 DIAGNOSIS — I129 Hypertensive chronic kidney disease with stage 1 through stage 4 chronic kidney disease, or unspecified chronic kidney disease: Secondary | ICD-10-CM | POA: Diagnosis not present

## 2021-05-02 DIAGNOSIS — Z833 Family history of diabetes mellitus: Secondary | ICD-10-CM | POA: Insufficient documentation

## 2021-05-02 DIAGNOSIS — K5730 Diverticulosis of large intestine without perforation or abscess without bleeding: Secondary | ICD-10-CM | POA: Insufficient documentation

## 2021-05-02 DIAGNOSIS — Z8249 Family history of ischemic heart disease and other diseases of the circulatory system: Secondary | ICD-10-CM | POA: Diagnosis not present

## 2021-05-02 DIAGNOSIS — K641 Second degree hemorrhoids: Secondary | ICD-10-CM | POA: Insufficient documentation

## 2021-05-02 DIAGNOSIS — K635 Polyp of colon: Secondary | ICD-10-CM

## 2021-05-02 HISTORY — PX: COLONOSCOPY WITH PROPOFOL: SHX5780

## 2021-05-02 HISTORY — PX: POLYPECTOMY: SHX5525

## 2021-05-02 SURGERY — COLONOSCOPY WITH PROPOFOL
Anesthesia: General | Site: Rectum

## 2021-05-02 MED ORDER — ACETAMINOPHEN 160 MG/5ML PO SOLN: 325.0000 mg | Freq: Once | ORAL | Status: DC

## 2021-05-02 MED ORDER — LACTATED RINGERS IV SOLN: INTRAVENOUS | Status: DC

## 2021-05-02 MED ORDER — STERILE WATER FOR IRRIGATION IR SOLN
Status: DC | PRN
  Administered 2021-05-02: 100 mL

## 2021-05-02 MED ORDER — LIDOCAINE HCL (CARDIAC) PF 100 MG/5ML IV SOSY
PREFILLED_SYRINGE | INTRAVENOUS | Status: DC | PRN
  Administered 2021-05-02: 40 mg via INTRAVENOUS

## 2021-05-02 MED ORDER — ACETAMINOPHEN 325 MG PO TABS: 325.0000 mg | ORAL_TABLET | Freq: Once | ORAL | Status: DC

## 2021-05-02 MED ORDER — SODIUM CHLORIDE 0.9 % IV SOLN: INTRAVENOUS | Status: DC

## 2021-05-02 MED ORDER — PROPOFOL 10 MG/ML IV BOLUS
INTRAVENOUS | Status: DC | PRN
  Administered 2021-05-02: 50 mg via INTRAVENOUS
  Administered 2021-05-02: 30 mg via INTRAVENOUS
  Administered 2021-05-02: 20 mg via INTRAVENOUS
  Administered 2021-05-02: 50 mg via INTRAVENOUS
  Administered 2021-05-02: 30 mg via INTRAVENOUS

## 2021-05-02 SURGICAL SUPPLY — 22 items
CLIP HMST 235XBRD CATH ROT (MISCELLANEOUS) IMPLANT
CLIP RESOLUTION 360 11X235 (MISCELLANEOUS)
ELECT REM PT RETURN 9FT ADLT (ELECTROSURGICAL)
ELECTRODE REM PT RTRN 9FT ADLT (ELECTROSURGICAL) IMPLANT
FORCEPS BIOP RAD 4 LRG CAP 4 (CUTTING FORCEPS) IMPLANT
GOWN CVR UNV OPN BCK APRN NK (MISCELLANEOUS) ×2 IMPLANT
GOWN ISOL THUMB LOOP REG UNIV (MISCELLANEOUS) ×6
INJECTOR VARIJECT VIN23 (MISCELLANEOUS) IMPLANT
KIT DEFENDO VALVE AND CONN (KITS) IMPLANT
KIT PRC NS LF DISP ENDO (KITS) ×1 IMPLANT
KIT PROCEDURE OLYMPUS (KITS) ×3
MANIFOLD NEPTUNE II (INSTRUMENTS) ×3 IMPLANT
MARKER SPOT ENDO TATTOO 5ML (MISCELLANEOUS) IMPLANT
PROBE APC STR FIRE (PROBE) IMPLANT
RETRIEVER NET ROTH 2.5X230 LF (MISCELLANEOUS) IMPLANT
SNARE COLD EXACTO (MISCELLANEOUS) ×3 IMPLANT
SNARE SHORT THROW 13M SML OVAL (MISCELLANEOUS) IMPLANT
SNARE SNG USE RND 15MM (INSTRUMENTS) IMPLANT
SPOT EX ENDOSCOPIC TATTOO (MISCELLANEOUS)
TRAP ETRAP POLY (MISCELLANEOUS) ×3 IMPLANT
VARIJECT INJECTOR VIN23 (MISCELLANEOUS)
WATER STERILE IRR 250ML POUR (IV SOLUTION) ×3 IMPLANT

## 2021-05-02 NOTE — Anesthesia Preprocedure Evaluation (Signed)
Anesthesia Evaluation  Patient identified by MRN, date of birth, ID band Patient awake    Reviewed: Allergy & Precautions, H&P , NPO status , Patient's Chart, lab work & pertinent test results  Airway Mallampati: II  TM Distance: >3 FB Neck ROM: full    Dental  (+) Edentulous Lower, Edentulous Upper   Pulmonary COPD,  COPD inhaler, Current SmokerPatient did not abstain from smoking.,    Pulmonary exam normal breath sounds clear to auscultation       Cardiovascular + CAD  Normal cardiovascular exam Rhythm:regular Rate:Normal     Neuro/Psych  Neuromuscular disease    GI/Hepatic   Endo/Other    Renal/GU CRFRenal disease     Musculoskeletal   Abdominal   Peds  Hematology   Anesthesia Other Findings   Reproductive/Obstetrics                             Anesthesia Physical Anesthesia Plan  ASA: 3  Anesthesia Plan: General   Post-op Pain Management:    Induction: Intravenous  PONV Risk Score and Plan: 2 and Treatment may vary due to age or medical condition, Propofol infusion and TIVA  Airway Management Planned: Natural Airway  Additional Equipment:   Intra-op Plan:   Post-operative Plan:   Informed Consent: I have reviewed the patients History and Physical, chart, labs and discussed the procedure including the risks, benefits and alternatives for the proposed anesthesia with the patient or authorized representative who has indicated his/her understanding and acceptance.     Dental Advisory Given  Plan Discussed with: CRNA  Anesthesia Plan Comments:         Anesthesia Quick Evaluation

## 2021-05-02 NOTE — H&P (Signed)
Lucilla Lame, MD [address] [place], [place] [address] [phone] Fax : [phone]  Primary Care Physician:  Delsa Grana, PA-C Primary Gastroenterologist:  Dr. Allen Norris  Pre-Procedure History & Physical: HPI:  Manuel [name] is a 65 y.o. male is here for an colonoscopy.   Past Medical History:  Diagnosis Date   Anemia    Anxiety    Aortic atherosclerosis ([place]) 01/11/2018   CT scan Feb 2019   Asthma    Bilateral carpal tunnel syndrome    Bilateral hand numbness    BPH without urinary obstruction    Cervical spondylosis without myelopathy    Chronic kidney disease    STAGE 3   Complete tear of rotator cuff    bilateral   COPD (chronic obstructive pulmonary disease) ([place])    Depression    Emphysema lung ([place]) 01/11/2018   Chest CT Feb 2019   History of stomach ulcers    HLD (hyperlipidemia)    LVH (left ventricular hypertrophy)    Personal history of tobacco use, presenting hazards to health 12/21/2015   Poor dentition    Sickle cell trait ([place])    Sleep apnea    Smoker     Past Surgical History:  Procedure Laterality Date   CARPAL TUNNEL RELEASE Left    COLONOSCOPY WITH PROPOFOL N/A 12/25/2015   Procedure: COLONOSCOPY WITH PROPOFOL;  Surgeon: Lucilla Lame, MD;  Location: [facility] END[facility];  Service: Endoscopy;  Laterality: N/A;   CYSTOURETHROSCOPY  08/25/12   with ureteral cathization w/wo retrograde pyelogram   HOLEP-LASER ENUCLEATION OF THE PROSTATE WITH MORCELLATION N/A 04/05/2021   Procedure: HOLEP-LASER ENUCLEATION OF THE PROSTATE WITH MORCELLATION;  Surgeon: Billey Co, MD;  Location: [facility] ORS;  Service: Urology;  Laterality: N/A;   ROTATOR CUFF REPAIR Right 01/25/15   SHOULDER ARTHROSCOPY WITH SUBACROMIAL DECOMPRESSION Right [id number]   TRANSURETHRAL RESECTION OF PROSTATE  08/25/12    Prior to Admission medications   Medication Sig Start Date End Date Taking? Authorizing Provider  Na Sulfate-K Sulfate-Mg Sulf (SUPREP BOWEL PREP KIT)  17.5-3.13-1.6 GM/177ML SOLN Take 1 kit by mouth as directed. 04/15/21   Lucilla Lame, MD  tamsulosin (FLOMAX) 0.4 MG CAPS capsule Take 0.4 mg by mouth.   Yes [provider]  diphenhydramine-acetaminophen (TYLENOL PM) 25-500 MG TABS tablet Take 2 tablets by mouth at bedtime as needed (pain). Patient not taking: Reported on 04/11/2021    [provider]  ezetimibe (ZETIA) 10 MG tablet Take 10 mg by mouth See admin instructions. 2 times a month Patient not taking: Reported on 04/11/2021    [provider]  rosuvastatin (CRESTOR) 20 MG tablet Take 1 tablet (20 mg total) by mouth at bedtime. Patient not taking: Reported on 04/11/2021 06/05/20   Delsa Grana, PA-C  vitamin B-12 (CYANOCOBALAMIN) 500 MCG tablet Take 2 tablets (1,000 mcg total) by mouth daily. Patient not taking: Reported on 04/11/2021 10/11/20   Delsa Grana, PA-C    Allergies as of 04/15/2021 - Review Complete 04/11/2021  Allergen Reaction Noted   Penicillin g Other (See Comments) 10/31/2015    Family History  Problem Relation Age of Onset   Aneurysm Mother    Hypertension Mother    Diabetes Father    Hypertension Father    Prostate cancer Father    Lupus Father    Cancer Maternal Aunt    Cancer Maternal Uncle    Diabetes Paternal Uncle    Heart disease Cousin    Prostate cancer Paternal Uncle  COPD Neg Hx    Stroke Neg Hx    Kidney disease Neg Hx     Social History   Socioeconomic History   Marital status: Married    Spouse name: Not on file   Number of children: Not on file   Years of education: Not on file   Highest education level: Not on file  Occupational History   Not on file  Tobacco Use   Smoking status: Every Day    Packs/day: 0.50    Years: 35.00    Pack years: 17.50    Types: Cigarettes   Smokeless tobacco: Never   Tobacco comments:    1-2 PPD for most of his 42 yr hx smoking, > 30 packyear  Vaping Use   Vaping Use: Never used  Substance and Sexual Activity   Alcohol  use: No   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   Sexual activity: Yes  Other Topics Concern   Not on file  Social History Narrative   Not on file   Social Determinants of Health   Financial Resource Strain: Not on file  Food Insecurity: Not on file  Transportation Needs: Not on file  Physical Activity: Not on file  Stress: Not on file  Social Connections: Not on file  Intimate Partner Violence: Not on file    Review of Systems: See HPI, otherwise negative ROS  Physical Exam: BP 123/86   Pulse 69   Temp (!) 97.5 F (36.4 C)   Wt 71.2 kg   SpO2 98%   BMI 22.53 kg/m  General:   Alert,  pleasant and cooperative in NAD Head:  Normocephalic and atraumatic. Neck:  Supple; no masses or thyromegaly. Lungs:  Clear throughout to auscultation.    Heart:  Regular rate and rhythm. Abdomen:  Soft, nontender and nondistended. Normal bowel sounds, without guarding, and without rebound.   Neurologic:  Alert and  oriented x4;  grossly normal neurologically.  Impression/Plan: Manuel [name] is here for an colonoscopy to be performed for a history of adenomatous polyps on 2017   Risks, benefits, limitations, and alternatives regarding  colonoscopy have been reviewed with the patient.  Questions have been answered.  All parties agreeable.   Lucilla Lame, MD  05/02/2021, 9:53 AM

## 2021-05-02 NOTE — Op Note (Signed)
[facility] Patient Name: Manuel [name] Procedure Date: 05/02/2021 9:48 AM MRN: [id number] Account #: [id number] Date of Birth: 03-Sep-1956 Admit Type: Outpatient Age: 65 Room: [facility] OR ROOM 01 Gender: Male Note Status: Finalized Procedure:             Colonoscopy Indications:           High risk colon cancer surveillance: Personal history                         of colonic polyps Providers:             Lucilla Lame MD, MD Referring MD:          Delsa Grana (Referring MD) Medicines:             Propofol per Anesthesia Complications:         No immediate complications. Procedure:             Pre-Anesthesia Assessment:                        - Prior to the procedure, a History and Physical was                         performed, and patient medications and allergies were                         reviewed. The patient's tolerance of previous                         anesthesia was also reviewed. The risks and benefits                         of the procedure and the sedation options and risks                         were discussed with the patient. All questions were                         answered, and informed consent was obtained. Prior                         Anticoagulants: The patient has taken no previous                         anticoagulant or antiplatelet agents. ASA Grade                         Assessment: II - A patient with mild systemic disease.                         After reviewing the risks and benefits, the patient                         was deemed in satisfactory condition to undergo the                         procedure.  After obtaining informed consent, the colonoscope was                         passed under direct vision. Throughout the procedure,                         the patient's blood pressure, pulse, and oxygen                         saturations were monitored continuously. The                          Colonoscope was introduced through the anus and                         advanced to the the cecum, identified by appendiceal                         orifice and ileocecal valve. The colonoscopy was                         performed without difficulty. The patient tolerated                         the procedure well. The quality of the bowel                         preparation was excellent. Findings:      The perianal and digital rectal examinations were normal.      Five sessile polyps were found in the sigmoid colon. The polyps were 3       to 6 mm in size. These polyps were removed with a cold snare. Resection       and retrieval were complete.      A 4 mm polyp was found in the transverse colon. The polyp was sessile.       The polyp was removed with a cold snare. Resection and retrieval were       complete.      Non-bleeding internal hemorrhoids were found during retroflexion. The       hemorrhoids were Grade II (internal hemorrhoids that prolapse but reduce       spontaneously).      Multiple small-mouthed diverticula were found in the sigmoid colon. Impression:            - Five 3 to 6 mm polyps in the sigmoid colon, removed                         with a cold snare. Resected and retrieved.                        - One 4 mm polyp in the transverse colon, removed with                         a cold snare. Resected and retrieved.                        - Non-bleeding internal hemorrhoids.                        -  Diverticulosis in the sigmoid colon. Recommendation:        - Discharge patient to home.                        - Resume previous diet.                        - Continue present medications.                        - Await pathology results.                        - Repeat colonoscopy in 3 years for surveillance. Procedure Code(s):     --- Professional ---                        [phone], Colonoscopy, flexible; with removal of                         tumor(s), polyp(s), or  other lesion(s) by snare                         technique Diagnosis Code(s):     --- Professional ---                        Z86.010, Personal history of colonic polyps                        K63.5, Polyp of colon CPT copyright 2019 American Medical Association. All rights reserved. The codes documented in this report are preliminary and upon coder review may  be revised to meet current compliance requirements. Lucilla Lame MD, MD 05/02/2021 10:21:34 AM This report has been signed electronically. Number of Addenda: 0 Note Initiated On: 05/02/2021 9:48 AM Scope Withdrawal Time: 0 hours 11 minutes 4 seconds  Total Procedure Duration: 0 hours 13 minutes 19 seconds  Estimated Blood Loss:  Estimated blood loss: none.      [facility]

## 2021-05-02 NOTE — Anesthesia Postprocedure Evaluation (Signed)
Anesthesia Post Note  Patient: Derrick Tiegs  Procedure(s) Performed: COLONOSCOPY WITH PROPOFOL (Rectum) POLYPECTOMY (Rectum)     Patient location during evaluation: PACU Anesthesia Type: General Level of consciousness: awake and alert and oriented Pain management: satisfactory to patient Vital Signs Assessment: post-procedure vital signs reviewed and stable Respiratory status: spontaneous breathing, nonlabored ventilation and respiratory function stable Cardiovascular status: blood pressure returned to baseline and stable Postop Assessment: Adequate PO intake and No signs of nausea or vomiting Anesthetic complications: no   No notable events documented.  Raliegh Ip

## 2021-05-02 NOTE — Transfer of Care (Signed)
Immediate Anesthesia Transfer of Care Note  Patient: Manuel [name]  Procedure(s) Performed: COLONOSCOPY WITH PROPOFOL (Rectum) POLYPECTOMY (Rectum)  Patient Location: PACU  Anesthesia Type: General  Level of Consciousness: awake, alert  and patient cooperative  Airway and Oxygen Therapy: Patient Spontanous Breathing and Patient connected to supplemental oxygen  Post-op Assessment: Post-op Vital signs reviewed, Patient's Cardiovascular Status Stable, Respiratory Function Stable, Patent Airway and No signs of Nausea or vomiting  Post-op Vital Signs: Reviewed and stable  Complications: No notable events documented.

## 2021-05-03 ENCOUNTER — Encounter: Payer: Self-pay | Admitting: Gastroenterology

## 2021-05-03 LAB — SURGICAL PATHOLOGY

## 2021-06-13 ENCOUNTER — Encounter: Payer: Self-pay | Admitting: Physician Assistant

## 2021-06-13 ENCOUNTER — Ambulatory Visit: Payer: Medicare Other | Admitting: Physician Assistant

## 2021-06-18 ENCOUNTER — Ambulatory Visit (INDEPENDENT_AMBULATORY_CARE_PROVIDER_SITE_OTHER): Payer: Medicare Other

## 2021-06-18 DIAGNOSIS — Z0000 Encounter for general adult medical examination without abnormal findings: Secondary | ICD-10-CM | POA: Diagnosis not present

## 2021-06-18 DIAGNOSIS — Z5989 Other problems related to housing and economic circumstances: Secondary | ICD-10-CM

## 2021-06-18 DIAGNOSIS — Z599 Problem related to housing and economic circumstances, unspecified: Secondary | ICD-10-CM

## 2021-06-18 NOTE — Patient Instructions (Addendum)
Manuel [name] , Thank you for taking time to come for your Medicare Wellness Visit. I appreciate your ongoing commitment to your health goals. Please review the following plan we discussed and let me know if I can assist you in the future.   Screening recommendations/referrals: Colonoscopy: done 05/02/21. Repeat 04/2024 Recommended yearly ophthalmology/optometry visit for glaucoma screening and checkup Recommended yearly dental visit for hygiene and checkup  Vaccinations: Influenza vaccine: done 10/04/20 Pneumococcal vaccine: done 04/12/18 Tdap vaccine: done 2015 Shingles vaccine: Shingrix discussed. Please contact your pharmacy for coverage information.  Covid-19:  done 04/04/20 & 03/14/21  Advanced directives: Advance directive discussed with you today. Even though you declined this today please call our office should you change your mind and we can give you the proper paperwork for you to fill out.   Conditions/risks identified: If you wish to quit smoking, help is available. For free tobacco cessation program offerings call the [facility] at 469-374-8177 or Live Well Line at 320-192-1958. You may also visit [email or web address].com or email [email or web address]@'$ .com for more information on other programs.    Next appointment: Follow up in one year for your annual wellness visit   Preventive Care 40-64 Years, Male Preventive care refers to lifestyle choices and visits with your health care provider that can promote health and wellness. What does preventive care include? A yearly physical exam. This is also called an annual well check. Dental exams once or twice a year. Routine eye exams. Ask your health care provider how often you should have your eyes checked. Personal lifestyle choices, including: Daily care of your teeth and gums. Regular physical activity. Eating a healthy diet. Avoiding tobacco and drug use. Limiting alcohol use. Practicing safe sex. Taking low-dose  aspirin every day starting at age 86. What happens during an annual well check? The services and screenings done by your health care provider during your annual well check will depend on your age, overall health, lifestyle risk factors, and family history of disease. Counseling  Your health care provider may ask you questions about your: Alcohol use. Tobacco use. Drug use. Emotional well-being. Home and relationship well-being. Sexual activity. Eating habits. Work and work Statistician. Screening  You may have the following tests or measurements: Height, weight, and BMI. Blood pressure. Lipid and cholesterol levels. These may be checked every 5 years, or more frequently if you are over 65 years old. Skin check. Lung cancer screening. You may have this screening every year starting at age 85 if you have a 30-pack-year history of smoking and currently smoke or have quit within the past 15 years. Fecal occult blood test (FOBT) of the stool. You may have this test every year starting at age 37. Flexible sigmoidoscopy or colonoscopy. You may have a sigmoidoscopy every 5 years or a colonoscopy every 10 years starting at age 53. Prostate cancer screening. Recommendations will vary depending on your family history and other risks. Hepatitis C blood test. Hepatitis B blood test. Sexually transmitted disease (STD) testing. Diabetes screening. This is done by checking your blood sugar (glucose) after you have not eaten for a while (fasting). You may have this done every 1-3 years. Discuss your test results, treatment options, and if necessary, the need for more tests with your health care provider. Vaccines  Your health care provider may recommend certain vaccines, such as: Influenza vaccine. This is recommended every year. Tetanus, diphtheria, and acellular pertussis (Tdap, Td) vaccine. You may need a Td booster every 10 years. Zoster  vaccine. You may need this after age 53. Pneumococcal  13-valent conjugate (PCV13) vaccine. You may need this if you have certain conditions and have not been vaccinated. Pneumococcal polysaccharide (PPSV23) vaccine. You may need one or two doses if you smoke cigarettes or if you have certain conditions. Talk to your health care provider about which screenings and vaccines you need and how often you need them. This information is not intended to replace advice given to you by your health care provider. Make sure you discuss any questions you have with your health care provider. Document Released: 11/16/2015 Document Revised: 07/09/2016 Document Reviewed: 08/21/2015 Elsevier Interactive Patient Education  2017 [place] Prevention in the Home Falls can cause injuries. They can happen to people of all ages. There are many things you can do to make your home safe and to help prevent falls. What can I do on the outside of my home? Regularly fix the edges of walkways and driveways and fix any cracks. Remove anything that might make you trip as you walk through a door, such as a raised step or threshold. Trim any bushes or trees on the path to your home. Use bright outdoor lighting. Clear any walking paths of anything that might make someone trip, such as rocks or tools. Regularly check to see if handrails are loose or broken. Make sure that both sides of any steps have handrails. Any raised decks and porches should have guardrails on the edges. Have any leaves, snow, or ice cleared regularly. Use sand or salt on walking paths during winter. Clean up any spills in your garage right away. This includes oil or grease spills. What can I do in the bathroom? Use night lights. Install grab bars by the toilet and in the tub and shower. Do not use towel bars as grab bars. Use non-skid mats or decals in the tub or shower. If you need to sit down in the shower, use a plastic, non-slip stool. Keep the floor dry. Clean up any water that spills on the  floor as soon as it happens. Remove soap buildup in the tub or shower regularly. Attach bath mats securely with double-sided non-slip rug tape. Do not have throw rugs and other things on the floor that can make you trip. What can I do in the bedroom? Use night lights. Make sure that you have a light by your bed that is easy to reach. Do not use any sheets or blankets that are too big for your bed. They should not hang down onto the floor. Have a firm chair that has side arms. You can use this for support while you get dressed. Do not have throw rugs and other things on the floor that can make you trip. What can I do in the kitchen? Clean up any spills right away. Avoid walking on wet floors. Keep items that you use a lot in easy-to-reach places. If you need to reach something above you, use a strong step stool that has a grab bar. Keep electrical cords out of the way. Do not use floor polish or wax that makes floors slippery. If you must use wax, use non-skid floor wax. Do not have throw rugs and other things on the floor that can make you trip. What can I do with my stairs? Do not leave any items on the stairs. Make sure that there are handrails on both sides of the stairs and use them. Fix handrails that are broken or loose. Make  sure that handrails are as long as the stairways. Check any carpeting to make sure that it is firmly attached to the stairs. Fix any carpet that is loose or worn. Avoid having throw rugs at the top or bottom of the stairs. If you do have throw rugs, attach them to the floor with carpet tape. Make sure that you have a light switch at the top of the stairs and the bottom of the stairs. If you do not have them, ask someone to add them for you. What else can I do to help prevent falls? Wear shoes that: Do not have high heels. Have rubber bottoms. Are comfortable and fit you well. Are closed at the toe. Do not wear sandals. If you use a stepladder: Make sure that  it is fully opened. Do not climb a closed stepladder. Make sure that both sides of the stepladder are locked into place. Ask someone to hold it for you, if possible. Clearly mark and make sure that you can see: Any grab bars or handrails. First and last steps. Where the edge of each step is. Use tools that help you move around (mobility aids) if they are needed. These include: Canes. Walkers. Scooters. Crutches. Turn on the lights when you go into a dark area. Replace any light bulbs as soon as they burn out. Set up your furniture so you have a clear path. Avoid moving your furniture around. If any of your floors are uneven, fix them. If there are any pets around you, be aware of where they are. Review your medicines with your doctor. Some medicines can make you feel dizzy. This can increase your chance of falling. Ask your doctor what other things that you can do to help prevent falls. This information is not intended to replace advice given to you by your health care provider. Make sure you discuss any questions you have with your health care provider. Document Released: 08/16/2009 Document Revised: 03/27/2016 Document Reviewed: 11/24/2014 Elsevier Interactive Patient Education  2017 [employer].

## 2021-06-18 NOTE — Progress Notes (Signed)
Subjective:   Manuel [name] is a 65 y.o. male who presents for an Initial Medicare Annual Wellness Visit.  Virtual Visit via Telephone Note  I connected with  Manuel [name] on 06/18/21 at  9:20 AM EDT by telephone and verified that I am speaking with the correct person using two identifiers.  Location: Patient: home Provider: [place] Persons participating in the virtual visit: [place]   I discussed the limitations, risks, security and privacy concerns of performing an evaluation and management service by telephone and the availability of in person appointments. The patient expressed understanding and agreed to proceed.  Interactive audio and video telecommunications were attempted between this nurse and patient, however failed, due to patient having technical difficulties OR patient did not have access to video capability.  We continued and completed visit with audio only.  Some vital signs may be absent or patient reported.   Clemetine Marker, LPN   Review of Systems     Cardiac Risk Factors include: advanced age (>34mn, >>23women);dyslipidemia;male gender;smoking/ tobacco exposure     Objective:    There were no vitals filed for this visit. There is no height or weight on file to calculate BMI.  Advanced Directives 06/18/2021 05/02/2021 04/05/2021 04/03/2021 12/28/2018 08/24/2017 08/18/2017  Does Patient Have a Medical Advance Directive? _0  No No  Would patient like information on creating a medical advance directive? No - Patient declined No - Patient declined No - Patient declined No - Patient declined No - Patient declined - -    Current Medications (verified) Outpatient Encounter Medications as of 06/18/2021  Medication Sig   diclofenac Sodium (VOLTAREN) 1 % GEL Apply 1 application topically 4 (four) times daily.   ezetimibe (ZETIA) 10 MG tablet Take 10 mg by mouth See admin instructions. 2 times a month   rosuvastatin (CRESTOR) 20 MG tablet  Take 1 tablet (20 mg total) by mouth at bedtime.   diphenhydramine-acetaminophen (TYLENOL PM) 25-500 MG TABS tablet Take 2 tablets by mouth at bedtime as needed (pain). (Patient not taking: Reported on 06/18/2021)   tamsulosin (FLOMAX) 0.4 MG CAPS capsule Take 0.4 mg by mouth. (Patient not taking: Reported on 06/18/2021)   vitamin B-12 (CYANOCOBALAMIN) 500 MCG tablet Take 2 tablets (1,000 mcg total) by mouth daily. (Patient not taking: No sig reported)   [DISCONTINUED] Na Sulfate-K Sulfate-Mg Sulf (SUPREP BOWEL PREP KIT) 17.5-3.13-1.6 GM/177ML SOLN Take 1 kit by mouth as directed.   Facility-Administered Encounter Medications as of 06/18/2021  Medication   cyanocobalamin ((VITAMIN B-12)) injection 1,000 mcg    Allergies (verified) Penicillin g   History: Past Medical History:  Diagnosis Date   Anemia    Anxiety    Aortic atherosclerosis ([facility] 01/11/2018   CT scan Feb 2019   Asthma    Bilateral carpal tunnel syndrome    Bilateral hand numbness    BPH without urinary obstruction    Cervical spondylosis without myelopathy    Chronic kidney disease    STAGE 3   Complete tear of rotator cuff    bilateral   COPD (chronic obstructive pulmonary disease) ([facility]    Depression    Emphysema lung ([facility] 01/11/2018   Chest CT Feb 2019   History of stomach ulcers    HLD (hyperlipidemia)    LVH (left ventricular hypertrophy)    Personal history of tobacco use, presenting hazards to health 12/21/2015   Poor dentition    Sickle cell trait ([facility]    Sleep  apnea    Smoker    Past Surgical History:  Procedure Laterality Date   CARPAL TUNNEL RELEASE Left    COLONOSCOPY WITH PROPOFOL N/A 12/25/2015   Procedure: COLONOSCOPY WITH PROPOFOL;  Surgeon: Lucilla Lame, MD;  Location: [facility];  Service: Endoscopy;  Laterality: N/A;   COLONOSCOPY WITH PROPOFOL N/A 05/02/2021   Procedure: COLONOSCOPY WITH PROPOFOL;  Surgeon: Lucilla Lame, MD;  Location: [place];  Service: Endoscopy;   Laterality: N/A;   CYSTOURETHROSCOPY  08/25/12   with ureteral cathization w/wo retrograde pyelogram   HOLEP-LASER ENUCLEATION OF THE PROSTATE WITH MORCELLATION N/A 04/05/2021   Procedure: HOLEP-LASER ENUCLEATION OF THE PROSTATE WITH MORCELLATION;  Surgeon: Billey Co, MD;  Location: [facility] OR[facility];  Service: Urology;  Laterality: N/A;   POLYPECTOMY N/A 05/02/2021   Procedure: POLYPECTOMY;  Surgeon: Lucilla Lame, MD;  Location: [facility];  Service: Endoscopy;  Laterality: N/A;   ROTATOR CUFF REPAIR Right 01/25/15   SHOULDER ARTHROSCOPY WITH SUBACROMIAL DECOMPRESSION Right [id number]   TRANSURETHRAL RESECTION OF PROSTATE  08/25/12   Family History  Problem Relation Age of Onset   Aneurysm Mother    Hypertension Mother    Diabetes Father    Hypertension Father    Prostate cancer Father    Lupus Father    Cancer Maternal Aunt    Cancer Maternal Uncle    Diabetes Paternal Uncle    Heart disease Cousin    Prostate cancer Paternal Uncle    COPD Neg Hx    Stroke Neg Hx    Kidney disease Neg Hx    Social History   Socioeconomic History   Marital status: Married    Spouse name: Not on file   Number of children: Not on file   Years of education: Not on file   Highest education level: Not on file  Occupational History   Not on file  Tobacco Use   Smoking status: Every Day    Packs/day: 0.50    Years: 35.00    Pack years: 17.50    Types: Cigarettes   Smokeless tobacco: Never   Tobacco comments:    1-2 PPD for most of his 61 yr hx smoking, > 30 packyear  Vaping Use   Vaping Use: Never used  Substance and Sexual Activity   Alcohol use: No   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   Sexual activity: Yes  Other Topics Concern   Not on file  Social History Narrative   Not on file   Social Determinants of Health   Financial Resource Strain: Medium Risk   Difficulty of Paying Living Expenses: Somewhat hard  Food Insecurity: No Food Insecurity   Worried About  Charity fundraiser in the Last Year: Never true   Ran Out of Food in the Last Year: Never true  Transportation Needs: No Transportation Needs   Lack of Transportation (Medical): No   Lack of Transportation (Non-Medical): No  Physical Activity: Inactive   Days of Exercise per Week: 0 days   Minutes of Exercise per Session: 0 min  Stress: No Stress Concern Present   Feeling of Stress : Only a little  Social Connections: Moderately Isolated   Frequency of Communication with Friends and Family: More than three times a week   Frequency of Social Gatherings with Friends and Family: Three times a week   Attends Religious Services: Never   Active Member of Clubs or Organizations: No   Attends Club or  Organization Meetings: Never   Marital Status: Married    Tobacco Counseling Ready to quit: Not Answered Counseling given: Not Answered Tobacco comments: 1-2 PPD for most of his 49 yr hx smoking, > 30 packyear   Clinical Intake:  Pre-visit preparation completed: Yes  Pain : No/denies pain     Nutritional Risks: None Diabetes: No  How often do you need to have someone help you when you read instructions, pamphlets, or other written materials from your doctor or pharmacy?: 1 - Never    Interpreter Needed?: No  Information entered by :: Clemetine Marker LPN   Activities of Daily Living In your present state of health, do you have any difficulty performing the following activities: 06/18/2021 05/02/2021  Hearing? N N  Vision? N N  Difficulty concentrating or making decisions? N N  Walking or climbing stairs? N N  Dressing or bathing? N N  Doing errands, shopping? N -  Preparing Food and eating ? N -  Using the Toilet? N -  In the past six months, have you accidently leaked urine? N -  Do you have problems with loss of bowel control? N -  Managing your Medications? N -  Managing your Finances? N -  Housekeeping or managing your Housekeeping? N -  Some recent data might be hidden     Patient Care Team: Delsa Grana, PA-C as PCP - General (Family Medicine) Lucilla Lame, MD as Consulting Physician (Gastroenterology) Laneta Simmers as Physician Assistant (Urology) Reche Dixon, PA-C (Orthopedic Surgery) Vladimir Crofts, MD as Consulting Physician (Neurology)  Indicate any recent Medical Services you may have received from other than [facility] providers in the past year (date may be approximate).     Assessment:   This is a routine wellness examination for [place].  Hearing/Vision screen Hearing Screening - Comments:: Pt denies hearing difficulty Vision Screening - Comments:: [facility]  Dietary issues and exercise activities discussed: Current Exercise Habits: The patient has a physically strenuous job, but has no regular exercise apart from work., Exercise limited by: orthopedic condition(s)   Goals Addressed             This Visit's Progress    DIET - INCREASE WATER INTAKE       Recommend drinking 6-8 glasses of water per day        Depression Screen PHQ 2/9 Scores 06/18/2021 04/11/2021 10/04/2020 06/04/2020 12/27/2019 05/25/2019 02/15/2019  PHQ - 2 Score 3 0 0 2 0 0 0  PHQ- 9 Score 10 - 4 7 0 0 0    Fall Risk Fall Risk  06/18/2021 04/11/2021 10/04/2020 06/04/2020 12/27/2019  Falls in the past year? 0 0 0 0 0  Number falls in past yr: 0 0 0 0 0  Injury with Fall? 0 0 0 0 0  Risk for fall due to : No Fall Risks - - - -  Follow up Falls prevention discussed Falls evaluation completed Falls evaluation completed Falls evaluation completed -    FALL RISK PREVENTION PERTAINING TO THE HOME:  Any stairs in or around the home? Yes  If so, are there any without handrails? No  Home free of loose throw rugs in walkways, pet beds, electrical cords, etc? Yes  Adequate lighting in your home to reduce risk of falls? Yes   ASSISTIVE DEVICES UTILIZED TO PREVENT FALLS:  Life alert? No  Use of a cane, walker or w/c? No  Grab  bars in the  bathroom? Yes  Shower chair or bench in shower? Yes  Elevated toilet seat or a handicapped toilet? Yes   TIMED UP AND GO:  Was the test performed? No . Telephonic visit.   Cognitive Function: Normal cognitive status assessed by direct observation by this Nurse Health Advisor. No abnormalities found.          Immunizations Immunization History  Administered Date(s) Administered   Influenza,inj,Quad PF,6+ Mos 11/01/2015, 10/31/2016, 08/24/2017, 07/15/2018, 08/26/2019, 10/04/2020   PFIZER(Purple Top)SARS-COV-2 Vaccination 04/04/2020, 03/14/2021   Pneumococcal Polysaccharide-23 04/12/2018   Td 11/03/2013    TDAP status: Up to date  Flu Vaccine status: Up to date  Pneumococcal vaccine status: Up to date  Covid-19 vaccine status: Completed vaccines  Qualifies for Shingles Vaccine? Yes   Zostavax completed No   Shingrix Completed?: No.    Education has been provided regarding the importance of this vaccine. Patient has been advised to call insurance company to determine out of pocket expense if they have not yet received this vaccine. Advised may also receive vaccine at local pharmacy or Health Dept. Verbalized acceptance and understanding.  Screening Tests Health Maintenance  Topic Date Due   Pneumococcal Vaccine 25-87 Years old (2 - PCV) 04/13/2019   INFLUENZA VACCINE  06/03/2021   Zoster Vaccines- Shingrix (1 of 2) 07/12/2021 (Originally 03/20/1975)   COVID-19 Vaccine (3 - Booster for Pfizer series) 08/14/2021   TETANUS/TDAP  11/04/2023   COLONOSCOPY (Pts 45-35yr Insurance coverage will need to be confirmed)  05/02/2024   Hepatitis C Screening  Completed   HIV Screening  Addressed   HPV VACCINES  Aged Out    Health Maintenance  Health Maintenance Due  Topic Date Due   Pneumococcal Vaccine 055643Years old (2 - PCV) 04/13/2019   INFLUENZA VACCINE  06/03/2021    Colorectal cancer screening: Type of screening: Colonoscopy. Completed 05/02/21. Repeat every 3  years  Lung Cancer Screening: (Low Dose CT Chest recommended if Age 554-80years, 30 pack-year currently smoking OR have quit w/in 15years.) does qualify. Completed 01/10/21.   Additional Screening:  Hepatitis C Screening: does qualify; Completed 10/31/16  Vision Screening: Recommended annual ophthalmology exams for early detection of glaucoma and other disorders of the eye. Is the patient up to date with their annual eye exam?  Yes  Who is the provider or what [facility] the [facility]   Dental Screening: Recommended annual dental exams for proper oral hygiene  Community Resource Referral / Chronic Care Management: CRR required this visit?  Yes   CCM required this visit?  No      Plan:     I have personally reviewed and noted the following in the patient's chart:   Medical and social history Use of alcohol, tobacco or illicit drugs  Current medications and supplements including opioid prescriptions. Patient is not currently taking opioid prescriptions. Functional ability and status Nutritional status Physical activity Advanced directives List of other physicians Hospitalizations, surgeries, and ER visits in previous 12 months Vitals Screenings to include cognitive, depression, and falls Referrals and appointments  In addition, I have reviewed and discussed with patient certain preventive protocols, quality metrics, and best practice recommendations. A written personalized care plan for preventive services as well as general preventive health recommendations were provided to patient.     KClemetine Marker LPN   [id number]  Nurse Notes: pt states wife recently hospitalized for pneumonia, she is home now but he  has been "going nonstop" and starting Friday developed a cough, congestion, chills and headache. Pt planning to get Robitussin today. Pt states he typically gets sick this time of year but is also relating  it to everything he's had going on and possible exposure at the hospital. Pt scheduled for virtual visit tomorrow with Dr. Ky Barban at 9:40. Link emailed to patient today for MyChart activation.

## 2021-06-19 ENCOUNTER — Encounter: Payer: Self-pay | Admitting: Family Medicine

## 2021-06-19 ENCOUNTER — Other Ambulatory Visit: Payer: Self-pay

## 2021-06-19 ENCOUNTER — Telehealth (INDEPENDENT_AMBULATORY_CARE_PROVIDER_SITE_OTHER): Payer: Medicare Other | Admitting: Family Medicine

## 2021-06-19 DIAGNOSIS — J069 Acute upper respiratory infection, unspecified: Secondary | ICD-10-CM

## 2021-06-19 NOTE — Progress Notes (Signed)
Virtual Visit via Video Note  I connected with Manuel [name] on 06/19/21 at  9:40 AM EDT by a video enabled telemedicine application and verified that I am speaking with the correct person using two identifiers.  Location: Patient: home Provider: [facility]   I discussed the limitations of evaluation and management by telemedicine and the availability of in person appointments. The patient expressed understanding and agreed to proceed.  History of Present Illness:  UPPER RESPIRATORY TRACT INFECTION - symptom onset 8/12 - has received 2 doses of mRNA COVID vaccine - wife recently hospitalized with PNA, came home Friday. Still with symptoms. Not sure if wife was positive. - diarrhea, headache, dizziness - always gets these sx this time of year - has not tested yet  Fever: no Cough: yes, has chronic COPD cough, hasn't changed Shortness of breath: no Chest pain: yes, with cough Chest tightness: no Chest congestion: yes Nasal congestion: yes Sore throat: a little Headache: yes Vomiting: no Rash: no Fatigue: yes Sick contacts: yes Context: better Recurrent sinusitis: no Relief with OTC cold/cough medications: yes  Treatments attempted: cough drops, robatussin, tylenol    Observations/Objective:  Patient had trouble connecting to video visit, entirety of visit conducted over the phone.  Speaks in full sentences, no respiratory distress.   Assessment and Plan:  URI Doing well with mild sx. Will test for COVID, flu, would be a candidate for COVID treatment if able to start today/tomorrow. Reviewed OTC symptom relief, self-quarantine guidelines, and emergency precautions.     I discussed the assessment and treatment plan with the patient. The patient was provided an opportunity to ask questions and all were answered. The patient agreed with the plan and demonstrated an understanding of the instructions.   The patient was advised to call back or seek an in-person evaluation if  the symptoms worsen or if the condition fails to improve as anticipated.  I provided 13 minutes of non-face-to-face time during this encounter.   Myles Gip, DO

## 2021-06-19 NOTE — Patient Instructions (Addendum)
It was great to see you!  Our plans for today:  - See below for self-isolation guidelines. You may end your quarantine if your test is negative or, if positive, once you are 5 days from symptom onset and fever free for 24 hours without use of tylenol or ibuprofen. Wear a well-fitting N95 mask for an additional 5 days. - If your test is positive, you would qualify for antiviral treatment if we can start today or tomorrow. - I recommend getting your COVID booster once you are healed from your current infection. - Certainly, if you are having difficulties breathing or unable to keep down fluids, go to the Emergency Department.   Take care and seek immediate care sooner if you develop any concerns.   Dr. Ky Barban     Person Under Monitoring Name: Manuel [name]  Location: [address] [place] [address]   Infection Prevention Recommendations for Individuals Confirmed to have, or Being Evaluated for, 2019 Novel Coronavirus (COVID-19) Infection Who Receive Care at Home  Individuals who are confirmed to have, or are being evaluated for, COVID-19 should follow the prevention steps below until a healthcare provider or local or state health department says they can return to normal activities.  Stay home except to get medical care You should restrict activities outside your home, except for getting medical care. Do not go to work, school, or public areas, and do not use public transportation or taxis.  Call ahead before visiting your doctor Before your medical appointment, call the healthcare provider and tell them that you have, or are being evaluated for, COVID-19 infection. This will help the healthcare provider's office take steps to keep other people from getting infected. Ask your healthcare provider to call the local or state health department.  Monitor your symptoms Seek prompt medical attention if your illness is worsening (e.g., difficulty breathing). Before going to your  medical appointment, call the healthcare provider and tell them that you have, or are being evaluated for, COVID-19 infection. Ask your healthcare provider to call the local or state health department.  Wear a facemask You should wear a facemask that covers your nose and mouth when you are in the same room with other people and when you visit a healthcare provider. People who live with or visit you should also wear a facemask while they are in the same room with you.  Separate yourself from other people in your home As much as possible, you should stay in a different room from other people in your home. Also, you should use a separate bathroom, if available.  Avoid sharing household items You should not share dishes, drinking glasses, cups, eating utensils, towels, bedding, or other items with other people in your home. After using these items, you should wash them thoroughly with soap and water.  Cover your coughs and sneezes Cover your mouth and nose with a tissue when you cough or sneeze, or you can cough or sneeze into your sleeve. Throw used tissues in a lined trash can, and immediately wash your hands with soap and water for at least 20 seconds or use an alcohol-based hand rub.  Wash your [facility] your hands often and thoroughly with soap and water for at least 20 seconds. You can use an alcohol-based hand sanitizer if soap and water are not available and if your hands are not visibly dirty. Avoid touching your eyes, nose, and mouth with unwashed hands.   Prevention Steps for Caregivers and Household Members of  Individuals Confirmed to have, or Being Evaluated for, COVID-19 Infection Being Cared for in the Home  If you live with, or provide care at home for, a person confirmed to have, or being evaluated for, COVID-19 infection please follow these guidelines to prevent infection:  Follow healthcare provider's instructions Make sure that you understand and can help the  patient follow any healthcare provider instructions for all care.  Provide for the patient's basic needs You should help the patient with basic needs in the home and provide support for getting groceries, prescriptions, and other personal needs.  Monitor the patient's symptoms If they are getting sicker, call his or her medical provider and tell them that the patient has, or is being evaluated for, COVID-19 infection. This will help the healthcare provider's office take steps to keep other people from getting infected. Ask the healthcare provider to call the local or state health department.  Limit the number of people who have contact with the patient If possible, have only one caregiver for the patient. Other household members should stay in another home or place of residence. If this is not possible, they should stay in another room, or be separated from the patient as much as possible. Use a separate bathroom, if available. Restrict visitors who do not have an essential need to be in the home.  Keep older adults, very young children, and other sick people away from the patient Keep older adults, very young children, and those who have compromised immune systems or chronic health conditions away from the patient. This includes people with chronic heart, lung, or kidney conditions, diabetes, and cancer.  Ensure good ventilation Make sure that shared spaces in the home have good air flow, such as from an air conditioner or an opened window, weather permitting.  Wash your hands often Wash your hands often and thoroughly with soap and water for at least 20 seconds. You can use an alcohol based hand sanitizer if soap and water are not available and if your hands are not visibly dirty. Avoid touching your eyes, nose, and mouth with unwashed hands. Use disposable paper towels to dry your hands. If not available, use dedicated cloth towels and replace them when they become wet.  Wear a  facemask and gloves Wear a disposable facemask at all times in the room and gloves when you touch or have contact with the patient's blood, body fluids, and/or secretions or excretions, such as sweat, saliva, sputum, nasal mucus, vomit, urine, or feces.  Ensure the mask fits over your nose and mouth tightly, and do not touch it during use. Throw out disposable facemasks and gloves after using them. Do not reuse. Wash your hands immediately after removing your facemask and gloves. If your personal clothing becomes contaminated, carefully remove clothing and launder. Wash your hands after handling contaminated clothing. Place all used disposable facemasks, gloves, and other waste in a lined container before disposing them with other household waste. Remove gloves and wash your hands immediately after handling these items.  Do not share dishes, glasses, or other household items with the patient Avoid sharing household items. You should not share dishes, drinking glasses, cups, eating utensils, towels, bedding, or other items with a patient who is confirmed to have, or being evaluated for, COVID-19 infection. After the person uses these items, you should wash them thoroughly with soap and water.  Wash laundry thoroughly Immediately remove and wash clothes or bedding that have blood, body fluids, and/or secretions or excretions, such as  sweat, saliva, sputum, nasal mucus, vomit, urine, or feces, on them. Wear gloves when handling laundry from the patient. Read and follow directions on labels of laundry or clothing items and detergent. In general, wash and dry with the warmest temperatures recommended on the label.  Clean all areas the individual has used often Clean all touchable surfaces, such as counters, tabletops, doorknobs, bathroom fixtures, toilets, phones, keyboards, tablets, and bedside tables, every day. Also, clean any surfaces that may have blood, body fluids, and/or secretions or excretions  on them. Wear gloves when cleaning surfaces the patient has come in contact with. Use a diluted bleach solution (e.g., dilute bleach with 1 part bleach and 10 parts water) or a household disinfectant with a label that says EPA-registered for coronaviruses. To make a bleach solution at home, add 1 tablespoon of bleach to 1 quart (4 cups) of water. For a larger supply, add  cup of bleach to 1 gallon (16 cups) of water. Read labels of cleaning products and follow recommendations provided on product labels. Labels contain instructions for safe and effective use of the cleaning product including precautions you should take when applying the product, such as wearing gloves or eye protection and making sure you have good ventilation during use of the product. Remove gloves and wash hands immediately after cleaning.  Monitor yourself for signs and symptoms of illness Caregivers and household members are considered close contacts, should monitor their health, and will be asked to limit movement outside of the home to the extent possible. Follow the monitoring steps for close contacts listed on the symptom monitoring form.   ? If you have additional questions, contact your local health department or call the epidemiologist on call at [phone] (available 24/7). ? This guidance is subject to change. For the most up-to-date guidance from [facility], please refer to their website: [email or web address]

## 2021-06-20 ENCOUNTER — Other Ambulatory Visit: Payer: Self-pay

## 2021-06-20 ENCOUNTER — Other Ambulatory Visit: Payer: Self-pay | Admitting: Family Medicine

## 2021-06-20 LAB — NOVEL CORONAVIRUS, NAA: SARS-CoV-2, NAA: DETECTED — AB

## 2021-06-20 LAB — SARS-COV-2, NAA 2 DAY TAT

## 2021-06-20 LAB — SPECIMEN STATUS REPORT

## 2021-06-20 MED ORDER — MOLNUPIRAVIR EUA 200MG CAPSULE
4.0000 | ORAL_CAPSULE | Freq: Two times a day (BID) | ORAL | 0 refills | Status: AC
  Filled 2021-06-20: qty 40, 5d supply, fill #0

## 2021-06-24 ENCOUNTER — Telehealth: Payer: Self-pay | Admitting: *Deleted

## 2021-06-24 NOTE — Telephone Encounter (Signed)
   Telephone encounter was:  Unsuccessful.  06/24/2021 Name: Manuel [name] MRN: [id number] DOB: Jan 28, 1956  Unsuccessful outbound call made today to assist with:  Food Insecurity and Caregiver Stress  Outreach Attempt:  1st Attempt  A HIPAA compliant voice message was left requesting a return call.  Instructed patient to call back at   Instructed patient to call back at [phone]  at their earliest convenience. .  [place], Care Management  [phone] [address] , [place] [address] Email : Ashby Dawes. [email or web address]-moran [email or web address]

## 2021-06-25 ENCOUNTER — Telehealth: Payer: Self-pay | Admitting: *Deleted

## 2021-06-25 NOTE — Telephone Encounter (Signed)
   Telephone encounter was:  Successful.  06/25/2021 Name: [name] MRN: [id number] DOB: 1956-04-14  Armeen Capulong Cizek is a 65 y.o. year old male who is a primary care patient of Delsa Grana, [place] . The community resource team was consulted for assistance with Patient advised he was unable to accept information at this time and would like a callback tomorrow 06/26/2021 at 12 noon.  Care guide performed the following interventions: Patient provided with information about care guide support team and interviewed to confirm resource needs.  Follow Up Plan:  Patient advised he was unable to accept information at this time and would like a callback tomorrow 06/26/2021 at 12 noon.  [place], Care Management  [phone] [address] , [place] [address] Email : Ashby Dawes. Greenauer-moran [email or web address]

## 2021-06-26 ENCOUNTER — Other Ambulatory Visit: Payer: Self-pay

## 2021-06-26 ENCOUNTER — Telehealth: Payer: Self-pay | Admitting: *Deleted

## 2021-06-26 NOTE — Telephone Encounter (Signed)
   Telephone encounter was:  Unsuccessful.  06/26/2021 Name: Manuel [name] MRN: [id number] DOB: 1955-12-07  Unsuccessful outbound call made today to assist with:  Food Insecurity  Outreach Attempt:  2nd Attempt  A HIPAA compliant voice message was left requesting a return call.  Instructed patient to call back at   Instructed patient to call back at [phone]  at their earliest convenience. . have called patient 2x today and he asked to be called back , then upon calling back got his [name], Care Management  [phone] [address] , [place] [address] Email : Ashby Dawes. Greenauer-moran [email or web address]

## 2021-06-26 NOTE — Telephone Encounter (Signed)
   Telephone encounter was:  Successful.  06/26/2021 Name: Manuel [name] MRN: [id number] DOB: [birth year]  [name] is a 65 y.o. year old male who is a primary care patient of Delsa Grana, [place] . The community resource team was consulted for assistance with Patient returned call and spoke of his situation , has utilities caught up and with a credit but will email him affordable housing options in [facility] guide performed the following interventions: Patient provided with information about care guide support team and interviewed to confirm resource needs.  Follow Up Plan:  No further follow up planned at this time. The patient has been provided with needed resources. [place], Care Management  [phone] [address] , [place] [address] Email : Ashby Dawes. Greenauer-moran [email or web address]

## 2021-06-29 ENCOUNTER — Ambulatory Visit
Admission: EM | Admit: 2021-06-29 | Discharge: 2021-06-29 | Disposition: A | Discharge status: Home / Self Care | Payer: Medicare Other | Attending: Emergency Medicine | Admitting: Emergency Medicine

## 2021-06-29 ENCOUNTER — Other Ambulatory Visit: Payer: Self-pay

## 2021-06-29 DIAGNOSIS — L03211 Cellulitis of face: Secondary | ICD-10-CM

## 2021-06-29 MED ORDER — DOXYCYCLINE HYCLATE 100 MG PO CAPS: 100.0000 mg | ORAL_CAPSULE | Freq: Two times a day (BID) | ORAL | 0 refills | Status: DC

## 2021-06-29 NOTE — ED Provider Notes (Signed)
[facility]    CSN: [id number] Arrival date & time: 06/29/21  [id number]      History   Chief Complaint Chief Complaint  Patient presents with   Facial Swelling    HPI Manuel [name] is a 65 y.o. male.   HPI  65 year old male here for evaluation of facial swelling.  Patient reports that he was shaving and nicked his chin a week ago.  The same day he noticed a rash and swelling developing on his chin and his lower lip.  This is since spread across his right cheek to his right temple area.  He states that the area is sore and tender but he denies fever, drainage, or swelling of his lips, tongue, or difficulty swallowing or breathing.  He states that today he developed a sore throat on the right side.  Patient reports that he used a new blade at the time if shaving it was notably.  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  Patient has a lesion in the center of his chin, on the right side just under the lower vermilion border, lesion near his right ear, erythema to the right side of his face, and small vesicles in the central inner aspect of the lower lip.  Past Medical History:  Diagnosis Date   Anemia    Anxiety    Aortic atherosclerosis ([place]) 01/11/2018   CT scan Feb 2019   Asthma    Bilateral carpal tunnel syndrome    Bilateral hand numbness    BPH without urinary obstruction    Cervical spondylosis without myelopathy    Chronic kidney disease    STAGE 3   Complete tear of rotator cuff    bilateral   COPD (chronic obstructive pulmonary disease) ([place])    Depression    Emphysema lung ([place]) 01/11/2018   Chest CT Feb 2019   History of stomach ulcers    HLD (hyperlipidemia)    LVH (left ventricular hypertrophy)    Personal history of tobacco use, presenting hazards to health 12/21/2015   Poor dentition    Sickle  cell trait (HCC)    Sleep apnea    Smoker     Patient Active Problem List   Diagnosis Date Noted   Hx of colonic polyps    Polyp of sigmoid colon    MDD (major depressive disorder), recurrent, in full remission ([place]) 10/31/2020   Primary insomnia 10/31/2020   Chronic kidney disease, stage III (moderate) ([place]) 11/12/2018   Recurrent depression ([place]) 07/15/2018   Chronic tension-type headache, intractable 05/17/2018   Myofascial pain syndrome 05/17/2018   Chronic daily headache 04/12/2018   Coronary artery disease 01/11/2018   Aortic atherosclerosis ([place]) 01/11/2018   Pulmonary emphysema ([place]) 01/11/2018   Mass of left thigh 12/30/2017   Vitamin D deficiency 01/14/2016   Vitamin B12 deficiency 01/14/2016   Renal lesion 11/13/2015   Chronic lower back pain 11/09/2015   BPH (benign prostatic hyperplasia) 11/01/2015   Elevated PSA 11/01/2015   Hyperlipidemia 11/01/2015   Bunion, left foot 11/01/2015   Sickle cell trait ([place]) 01/16/2015   Cervical spondylosis without myelopathy 01/09/2015   Carpal tunnel syndrome 01/09/2015   Rotator cuff syndrome of left shoulder 09/18/2014   LVH (left ventricular hypertrophy) 08/18/2014   Current smoker 10/03/2013    Past Surgical History:  Procedure Laterality Date   CARPAL TUNNEL RELEASE Left    COLONOSCOPY WITH PROPOFOL N/A 12/25/2015   Procedure: COLONOSCOPY WITH PROPOFOL;  Surgeon: Lucilla Lame, MD;  Location: [facility];  Service: Endoscopy;  Laterality: N/A;   COLONOSCOPY WITH PROPOFOL N/A 05/02/2021   Procedure: COLONOSCOPY WITH PROPOFOL;  Surgeon: Lucilla Lame, MD;  Location: [place];  Service: Endoscopy;  Laterality: N/A;   CYSTOURETHROSCOPY  08/25/12   with ureteral cathization w/wo retrograde pyelogram   HOLEP-LASER ENUCLEATION OF THE PROSTATE WITH MORCELLATION N/A 04/05/2021   Procedure: HOLEP-LASER ENUCLEATION OF THE PROSTATE WITH MORCELLATION;  Surgeon: Billey Co, MD;  Location: [facility];  Service: Urology;   Laterality: N/A;   POLYPECTOMY N/A 05/02/2021   Procedure: POLYPECTOMY;  Surgeon: Lucilla Lame, MD;  Location: [place];  Service: Endoscopy;  Laterality: N/A;   ROTATOR CUFF REPAIR Right 01/25/15   SHOULDER ARTHROSCOPY WITH SUBACROMIAL DECOMPRESSION Right [id number]   TRANSURETHRAL RESECTION OF PROSTATE  08/25/12       Home Medications    Prior to Admission medications   Medication Sig Start Date End Date Taking? Authorizing Provider  diphenhydramine-acetaminophen (TYLENOL PM) 25-500 MG TABS tablet Take 2 tablets by mouth at bedtime as needed (pain).   Yes [provider]  doxycycline (VIBRAMYCIN) 100 MG capsule Take 1 capsule (100 mg total) by mouth 2 (two) times daily. 06/29/21  Yes Margarette Canada, NP  ezetimibe (ZETIA) 10 MG tablet Take 10 mg by mouth See admin instructions. 2 times a month   Yes [provider]  rosuvastatin (CRESTOR) 20 MG tablet Take 1 tablet (20 mg total) by mouth at bedtime. 06/05/20  Yes Delsa Grana, PA-C  vitamin B-12 (CYANOCOBALAMIN) 500 MCG tablet Take 2 tablets (1,000 mcg total) by mouth daily. 10/11/20  Yes Delsa Grana, PA-C  diclofenac Sodium (VOLTAREN) 1 % GEL Apply 1 application topically 4 (four) times daily. 04/25/21   [provider]  tamsulosin (FLOMAX) 0.4 MG CAPS capsule Take 0.4 mg by mouth. Patient not taking: No sig reported    [provider]    Family History Family History  Problem Relation Age of Onset   Aneurysm Mother    Hypertension Mother    Diabetes Father    Hypertension Father    Prostate cancer Father    Lupus Father    Cancer Maternal Aunt    Cancer Maternal Uncle    Diabetes Paternal Uncle    Heart disease Cousin    Prostate cancer Paternal Uncle    COPD Neg Hx    Stroke Neg Hx    Kidney disease Neg Hx     Social History Social History   Tobacco Use   Smoking status: Every Day    Packs/day: 0.50    Years: 35.00    Pack years: 17.50    Types: Cigarettes   Smokeless  tobacco: Never   Tobacco comments:    1-2 PPD for most of his 59 yr hx smoking, > 30 packyear  Vaping Use   Vaping Use: Never used  Substance Use Topics   Alcohol use: No   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  HENT:  Positive for facial swelling and mouth sores. Negative for trouble swallowing.   Respiratory:  Negative for shortness of breath.   Skin:  Positive  for color change and rash.  Hematological: Negative.   Psychiatric/Behavioral: Negative.      Physical Exam Triage Vital Signs ED Triage Vitals  Enc Vitals Group     BP 06/29/21 0838 131/86     Pulse Rate 06/29/21 0838 69     Resp 06/29/21 0838 16     Temp 06/29/21 0838 98 F (36.7 C)     Temp Source 06/29/21 0838 Oral     SpO2 06/29/21 0838 99 %     Weight 06/29/21 0840 160 lb (72.6 kg)     Height 06/29/21 0840 '5\' 10"'$  (1.778 m)     Head Circumference --      Peak Flow --      Pain Score 06/29/21 0840 7     Pain Loc --      Pain Edu? --      Excl. in [place]? --    No data found.  Updated Vital Signs BP 131/86 (BP Location: Right Arm)   Pulse 69   Temp 98 F (36.7 C) (Oral)   Resp 16   Ht '5\' 10"'$  (1.778 m)   Wt 160 lb (72.6 kg)   SpO2 99%   BMI 22.96 kg/m   Visual Acuity Right Eye Distance:   Left Eye Distance:   Bilateral Distance:    Right Eye Near:   Left Eye Near:    Bilateral Near:     Physical Exam Vitals and nursing note reviewed.  Constitutional:      General: He is not in acute distress.    Appearance: Normal appearance. He is not ill-appearing.  HENT:     Head: Normocephalic and atraumatic.     Right Ear: Tympanic membrane, ear canal and external ear normal. There is no impacted cerumen.     Left Ear: Tympanic membrane, ear canal and external ear normal. There is no impacted cerumen.  Cardiovascular:     Rate and Rhythm:  Normal rate and regular rhythm.     Pulses: Normal pulses.     Heart sounds: Normal heart sounds. No murmur heard.   No gallop.  Pulmonary:     Effort: Pulmonary effort is normal.     Breath sounds: Normal breath sounds. No stridor. No wheezing, rhonchi or rales.  Musculoskeletal:     Cervical back: Normal range of motion and neck supple.  Lymphadenopathy:     Cervical: Cervical adenopathy present.  Skin:    General: Skin is warm and dry.     Capillary Refill: Capillary refill takes less than 2 seconds.     Findings: Erythema and lesion present.  Neurological:     General: No focal deficit present.     Mental Status: He is alert and oriented to person, place, and time.  Psychiatric:        Mood and Affect: Mood normal.        Behavior: Behavior normal.        Thought Content: Thought content normal.        Judgment: Judgment normal.     UC Treatments / Results  Labs (all labs ordered are listed, but only abnormal results are displayed) Labs Reviewed - No data to display  EKG   Radiology No results found.  Procedures Procedures (including critical care time)  Medications Ordered in UC Medications - No data to display  Initial Impression / Assessment and Plan / UC Course  I have reviewed the triage vital signs and the nursing notes.  Pertinent labs &  imaging results that were available during my care of the patient were reviewed by me and considered in my medical decision making (see chart for details).  Patient is a very pleasant, nontoxic-appearing 65 year old male here for evaluation of right facial swelling that started approximately a week ago after he nicked himself while shaving with a new blade.  He denies any fever or drainage.  Patient also denies any swelling of his lips or tongue, difficulty swallowing or trouble breathing.  He does endorse a right-sided sore throat that started today.  Patient's physical exam reveals mild swelling to the central chin and  lower lip on the right just inferior to the vermilion border.  There is no erythema extending across the right cheek without any induration or fluctuance and there is a smaller lesion near his right ear.  Oropharyngeal exam reveals pink and moist oral mucosal tissue with 2 small milky fluid-filled vesicles in the center aspect of the lower lip just past the wet dry border.  These vesicles do not look herpetic.  There is no surrounding erythema.  There is no swelling of the tongue and his Mallampati score is a 2.  Patient does have clustered scabbed lesions in the center of his chin, on the right side of his lower lip past the vermilion border, and right near the ear.  He denies any honey crust and these lesions are not vesicular.  Lesions look more like impetigo.  There is surrounding erythema to the lesions.  No honey crust noted.  Patient does have right-sided cervical lymphadenopathy that is tender to palpation but the lymph node is freely mobile.  We will treat patient for facial cellulitis with doxycycline as he has a penicillin allergy which she is unsure if his reaction.  Patient encouraged to stop using the blister cream that he has been using as this may be spreading the lesions.  ER & return precautions reviewed with patient.   Final Clinical Impressions(s) / UC Diagnoses   Final diagnoses:  Facial cellulitis     Discharge Instructions      Take the Doxycycline twice daily with food for 10 days.  Doxycycline will make you more sensitive to sunburn so wear sunscreen when outdoors and reapply it every 90 minutes.  Use OTC Tylenol and Ibuprofen according to the package instructions as needed for pain.  Stop using your blister cream as this may be spreading the lesions around her face.  Return for new or worsening symptoms.       ED Prescriptions     Medication Sig Dispense Auth. Provider   doxycycline (VIBRAMYCIN) 100 MG capsule Take 1 capsule (100 mg total) by mouth 2 (two) times  daily. 20 capsule Margarette Canada, NP      I have reviewed the PDMP during this encounter.   Margarette Canada, NP 06/29/21 [phone]

## 2021-06-29 NOTE — ED Triage Notes (Signed)
Pt reports tested positive for COVID x2 week ago, was given medication (unsure what COVID med)).   Pt reports "knick myself shaving under my lip, then I started to get a rash to my lower face), pt reports now has spread above right ear. Reports swelling to lower face/chin area. Pt also reports soreness/tenderness.   Has tried to treat the area with "blister cream"

## 2021-06-29 NOTE — Discharge Instructions (Addendum)
Take the Doxycycline twice daily with food for 10 days.  Doxycycline will make you more sensitive to sunburn so wear sunscreen when outdoors and reapply it every 90 minutes.  Use OTC Tylenol and Ibuprofen according to the package instructions as needed for pain.  Stop using your blister cream as this may be spreading the lesions around her face.  Return for new or worsening symptoms.

## 2021-07-05 ENCOUNTER — Ambulatory Visit: Payer: Medicare Other | Admitting: Physician Assistant

## 2021-07-08 NOTE — Progress Notes (Signed)
07/09/2021  4:07 PM   Manuel [name] 04/01/1956 [id number]  Referring provider: Delsa Grana, PA-C [address] [place],  [place] [address]  Urological history: 1. Elevated PSA -PSA Trend Component     Latest Ref Rng & Units 07/14/2016 08/01/2016 01/05/2017 08/21/2017  Prostate Specific Ag, Serum     0.0 - 4.0 ng/mL 5.6 (H) 4.1 (H) 4.1 (H) 4.8 (H)   Component     Latest Ref Rng & Units 03/22/2018 11/30/2018 12/23/2018 03/29/2019  Prostate Specific Ag, Serum     0.0 - 4.0 ng/mL 5.1 (H) 5.3 (H) 5.1 (H) 5.6 (H)   Component     Latest Ref Rng & Units 05/15/2020 11/12/2020  Prostate Specific Ag, Serum     0.0 - 4.0 ng/mL 5.2 (H) 5.5 (H)  -Benign prostate biopsy in 10/2016 - PSA 6.1 -Prostate MRI on 07/25/2019 revealed a small PI-RADS category 4 lesion in the right posterolateral peripheral zone of the prostate apex is present. Targeting data sent to [place].  Considerable benign prostatic hypertrophy, prostate volume 78.53 cubic cm.  Thin peripheral zone with generalized low T2 signal, probably postinflammatory.  Sigmoid colon diverticulosis. 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 BPH with LU TS -PSA pending -I PSS 4/0 -PVR 0 mL -s/p HoLEP 04/2021  3. High risk hematuria -smoker -work up in 2013.  He underwent a CT Urogram, cystoscopy with bilateral retrogrades and ultmately a TURP with Dr. Darcus Austin at [facility].  No malignancies were discovered.  Hematuria was from his enlarged, friable prostate.  He has not had any gross hematuria since that time.  A too small to characterize lesion was seen in the left midpole of the kidney on the CT Urogram in 2013.   RUS 05/2019 Increased echogenicity in the cortex of both kidneys is consistent with medical renal disease. No other acute abnormalities identified -cysto 10/2019 no malignancy found  -CTU 2022 and cysto 2022 negative for malignancy -no reports of gross  hematuria -UA negative for micro heme  4. ED -SHIM 13 -contributing factors of age, smoking, BPH, CAD, HLD, depression and antidepressents -taking sildenafil 20 mg, 3 to 5 tablets prior to intercourse  Chief Complaint  Patient presents with   Benign Prostatic Hypertrophy     HPI: Manuel [name] is a 65 y.o.  male who presents today for a 6 month follow up.    He is not having incontinence.  He is voiding with a strong stream.  Patient denies any modifying or aggravating factors.  Patient denies any gross hematuria, dysuria or suprapubic/flank pain.  Patient denies any fevers, chills, nausea or vomiting.     IPSS     Row Name 07/09/21 0900         International Prostate Symptom Score   How often have you had the sensation of not emptying your bladder? Less than 1 in 5     How often have you had to urinate less than every two hours? Less than 1 in 5 times     How often have you found you stopped and started again several times when you urinated? Less than 1 in 5 times     How often have you found it difficult to postpone urination? Not at All     How often have you had a weak urinary stream? Not at All     How often have you had to strain to start urination? Not at  All     How many times did you typically get up at night to urinate? 1 Time     Total IPSS Score 4           Quality of Life due to urinary symptoms   If you were to spend the rest of your life with your urinary condition just the way it is now how would you feel about that? Delighted               Score:  1-7 Mild 8-19 Moderate 20-35 Severe  Patient still having spontaneous erections.   He denies any pain or curvature with erections.    SHIM     Row Name 07/09/21 0933         SHIM: Over the last 6 months:   How do you rate your confidence that you could get and keep an erection? Moderate     When you had erections with sexual stimulation, how often were your erections hard enough for penetration  (entering your partner)? Sometimes (about half the time)     During sexual intercourse, how often were you able to maintain your erection after you had penetrated (entered) your partner? A Few Times (much less than half the time)     During sexual intercourse, how difficult was it to maintain your erection to completion of intercourse? Very Difficult     When you attempted sexual intercourse, how often was it satisfactory for you? Sometimes (about half the time)           SHIM Total Score   SHIM 13              Score: 1-7 Severe ED 8-11 Moderate ED 12-16 Mild-Moderate ED 17-21 Mild ED 22-25 No ED    SHIM     Row Name 07/09/21 0933         SHIM: Over the last 6 months:   How do you rate your confidence that you could get and keep an erection? Moderate     When you had erections with sexual stimulation, how often were your erections hard enough for penetration (entering your partner)? Sometimes (about half the time)     During sexual intercourse, how often were you able to maintain your erection after you had penetrated (entered) your partner? A Few Times (much less than half the time)     During sexual intercourse, how difficult was it to maintain your erection to completion of intercourse? Very Difficult     When you attempted sexual intercourse, how often was it satisfactory for you? Sometimes (about half the time)           SHIM Total Score   SHIM 13                PMH: Past Medical History:  Diagnosis Date   Anemia    Anxiety    Aortic atherosclerosis ([place]) 01/11/2018   CT scan Feb 2019   Asthma    Bilateral carpal tunnel syndrome    Bilateral hand numbness    BPH without urinary obstruction    Cervical spondylosis without myelopathy    Chronic kidney disease    STAGE 3   Complete tear of rotator cuff    bilateral   COPD (chronic obstructive pulmonary disease) ([place])    Depression    Emphysema lung ([facility]) 01/11/2018   Chest CT Feb 2019   History of  stomach ulcers    HLD (hyperlipidemia)    LVH (  left ventricular hypertrophy)    Personal history of tobacco use, presenting hazards to health 12/21/2015   Poor dentition    Sickle cell trait ([place])    Sleep apnea    Smoker     Surgical History: Past Surgical History:  Procedure Laterality Date   CARPAL TUNNEL RELEASE Left    COLONOSCOPY WITH PROPOFOL N/A 12/25/2015   Procedure: COLONOSCOPY WITH PROPOFOL;  Surgeon: Lucilla Lame, MD;  Location: [facility];  Service: Endoscopy;  Laterality: N/A;   COLONOSCOPY WITH PROPOFOL N/A 05/02/2021   Procedure: COLONOSCOPY WITH PROPOFOL;  Surgeon: Lucilla Lame, MD;  Location: [facility];  Service: Endoscopy;  Laterality: N/A;   CYSTOURETHROSCOPY  08/25/12   with ureteral cathization w/wo retrograde pyelogram   HOLEP-LASER ENUCLEATION OF THE PROSTATE WITH MORCELLATION N/A 04/05/2021   Procedure: HOLEP-LASER ENUCLEATION OF THE PROSTATE WITH MORCELLATION;  Surgeon: Billey Co, MD;  Location: [facility];  Service: Urology;  Laterality: N/A;   POLYPECTOMY N/A 05/02/2021   Procedure: POLYPECTOMY;  Surgeon: Lucilla Lame, MD;  Location: [place];  Service: Endoscopy;  Laterality: N/A;   ROTATOR CUFF REPAIR Right 01/25/15   SHOULDER ARTHROSCOPY WITH SUBACROMIAL DECOMPRESSION Right [id number]   TRANSURETHRAL RESECTION OF PROSTATE  08/25/12    Home Medications:  Allergies as of 07/09/2021       Reactions   Penicillin G Other (See Comments)   Childhood        Medication List        Accurate as of July 09, 2021 11:59 PM. If you have any questions, ask your nurse or doctor.          diclofenac Sodium 1 % Gel Commonly known as: VOLTAREN Apply 1 application topically 4 (four) times daily.   diphenhydramine-acetaminophen 25-500 MG Tabs tablet Commonly known as: TYLENOL PM Take 2 tablets by mouth at bedtime as needed (pain).   doxycycline 100 MG capsule Commonly known as: VIBRAMYCIN Take 1 capsule (100 mg total) by mouth 2  (two) times daily.   ezetimibe 10 MG tablet Commonly known as: ZETIA Take 10 mg by mouth See admin instructions. 2 times a month   rosuvastatin 20 MG tablet Commonly known as: Crestor Take 1 tablet (20 mg total) by mouth at bedtime.   sildenafil 100 MG tablet Commonly known as: VIAGRA Take 1 tablet (100 mg total) by mouth daily as needed for erectile dysfunction. Take two hours prior to intercourse on an empty stomach Started by: Zara Council, PA-C   tamsulosin 0.4 MG Caps capsule Commonly known as: FLOMAX Take 0.4 mg by mouth.   vitamin B-12 500 MCG tablet Commonly known as: CYANOCOBALAMIN Take 2 tablets (1,000 mcg total) by mouth daily.        Allergies:  Allergies  Allergen Reactions   Penicillin G Other (See Comments)    Childhood    Family History: Family History  Problem Relation Age of Onset   Aneurysm Mother    Hypertension Mother    Diabetes Father    Hypertension Father    Prostate cancer Father    Lupus Father    Cancer Maternal Aunt    Cancer Maternal Uncle    Diabetes Paternal Uncle    Heart disease Cousin    Prostate cancer Paternal Uncle    COPD Neg Hx    Stroke Neg Hx    Kidney disease Neg Hx     Social History:  reports that he has been smoking cigarettes. He has a 17.50 pack-year smoking history. He  has never used smokeless tobacco. He reports that he does not drink alcohol and does not use drugs.  ROS: For pertinent review of systems please refer to history of present illness  Physical Exam: BP 128/71   Pulse 85   Ht '5\' 10"'$  (1.778 m)   Wt 156 lb (70.8 kg)   BMI 22.38 kg/m   Constitutional:  Well nourished. Alert and oriented, No acute distress. HEENT: [place] AT, mask in place.  Trachea midline Cardiovascular: No clubbing, cyanosis, or edema. Respiratory: Normal respiratory effort, no increased work of breathing. GU: No CVA tenderness.  No bladder fullness or masses.  Patient with circumcised phallus.  Urethral meatus is patent.   No penile discharge. No penile lesions or rashes. Scrotum without lesions, cysts, rashes and/or edema.  Testicles are located scrotally bilaterally. No masses are appreciated in the testicles. Left and right epididymis are normal. Rectal: Patient with  normal sphincter tone. Anus and perineum without scarring or rashes. No rectal masses are appreciated. Prostate is approximately 50 grams, no nodules are appreciated. HoLEP defect.  Seminal vesicles could not be palpated.  Neurologic: Grossly intact, no focal deficits, moving all 4 extremities. Psychiatric: Normal mood and affect.   Laboratory Data: Component     Latest Ref Rng & Units 04/11/2021  WBC     3.8 - 10.8 Thousand/uL 6.7  RBC     4.20 - 5.80 Million/uL 5.24  Hemoglobin     13.2 - 17.1 g/dL 13.0 (L)  HCT     38.5 - 50.0 % 39.9  MCV     80.0 - 100.0 fL 76.1 (L)  MCH     27.0 - 33.0 pg 24.8 (L)  MCHC     32.0 - 36.0 g/dL 32.6  RDW     11.0 - 15.0 % 14.8  Platelets     140 - 400 Thousand/uL 281  MPV     7.5 - 12.5 fL 9.6  NEUT#     1,500 - 7,800 cells/uL 4,181  Lymphocyte #     850 - 3,900 cells/uL 1,561  Monocyte #     200 - 950 cells/uL   Eosinophils Absolute     15 - 500 cells/uL 315  Basophils Absolute     0 - 200 cells/uL 40  Neutrophils     % 62.4  Lymphocytes     %   Monocytes Relative     % 9.0  Eosinophil     % 4.7  Basophil     % 0.6  Smear Review        WBC mixed population     200 - 950 cells/uL   Total Lymphocyte     % 23.3  Absolute Monocytes     200 - 950 cells/uL 603  nRBC     0.0 - 0.2 %   WBC, UA     0 - 5 /hpf   Epithelial Cells (non renal)     0 - 10 /hpf   Renal Epithel, UA     None seen /hpf   Casts     None seen /lpf   Cast Type     N/A   Bacteria, UA     None seen/Few    Component     Latest Ref Rng & Units 04/11/2021  Cholesterol     <200 mg/dL 206 (H)  HDL Cholesterol     > OR = 40 mg/dL 46  Triglycerides     <150 mg/dL 130  LDL Cholesterol (Calc)     mg/dL  (calc) 135 (H)  Total CHOL/HDL Ratio     <5.0 (calc) 4.5  Non-HDL Cholesterol (Calc)     <130 mg/dL (calc) 160 (H)   Component     Latest Ref Rng & Units 04/11/2021  Glucose     65 - 99 mg/dL 80  BUN     7 - 25 mg/dL 10  Creatinine     0.70 - 1.25 mg/dL 1.16  BUN/Creatinine Ratio     6 - 22 (calc) NOT APPLICABLE  Sodium     [id number] - 146 mmol/L 142  Potassium     3.5 - 5.3 mmol/L 4.1  Chloride     98 - 110 mmol/L 103  CO2     20 - 32 mmol/L 32  Calcium     8.6 - 10.3 mg/dL 9.1  Total Protein     6.1 - 8.1 g/dL 6.3  Albumin MSPROF     3.6 - 5.1 g/dL 3.5 (L)  Globulin     1.9 - 3.7 g/dL (calc) 2.8  AG Ratio     1.0 - 2.5 (calc) 1.3  Total Bilirubin     0.2 - 1.2 mg/dL 0.3  Alkaline phosphatase (APISO)     35 - 144 U/L 68  AST     10 - 35 U/L 9 (L)  ALT     9 - 46 U/L 7 (L)   Urinalysis Component     Latest Ref Rng & Units 07/09/2021  Specific Gravity, UA     1.005 - 1.030 1.015  pH, UA     5.0 - 7.5 5.5  Color, UA     Yellow Yellow  Appearance Ur     Clear Clear  Leukocytes,UA     Negative Negative  Protein,UA     Negative/Trace Negative  Glucose, UA     Negative Negative  Ketones, UA     Negative Negative  RBC, UA     Negative Negative  Bilirubin, UA     Negative Negative  Urobilinogen, Ur     0.2 - 1.0 mg/dL 0.2  Nitrite, UA     Negative Negative  Microscopic Examination      See below:   Component     Latest Ref Rng & Units 07/09/2021  WBC, UA     0 - 5 /hpf 0-5  RBC     0 - 2 /hpf 0-2  Epithelial Cells (non renal)     0 - 10 /hpf 0-10  Bacteria, UA     None seen/Few None seen   I have reviewed the labs.  Pertinent Imaging Results for EIKER, SAED (MRN [id number]) as of 07/09/2021 09:43  Ref. Range 07/09/2021 09:33  Scan Result Unknown 0    Assessment & Plan:    1. Elevated PSA -PSA pending -prostate chips 04/2021-negative for malignancy    2. BPH with LUTS -DRE benign -UA benign -PVR < 300 cc -symptoms - none  3. Erectile  dysfunction -continue sildenafil 20 mg, on-demand-dosing -script sent to pharmacy   4. High risk hematuria -work up completed in 2013 and 2022 - positive for an enlarged prostate -no reports of gross hematuria -UA negative for micro heme  Return in about 6 months (around 01/06/2022) for IPSS, SHIM, PSA and exam.  Zara Council, [facility] [place] [address], [place], [place] [address] [phone]

## 2021-07-09 ENCOUNTER — Ambulatory Visit (INDEPENDENT_AMBULATORY_CARE_PROVIDER_SITE_OTHER): Payer: Medicare Other | Admitting: Urology

## 2021-07-09 ENCOUNTER — Encounter: Payer: Self-pay | Admitting: Urology

## 2021-07-09 ENCOUNTER — Other Ambulatory Visit: Payer: Self-pay

## 2021-07-09 VITALS — BP 128/71 | HR 85 | Ht 70.0 in | Wt 156.0 lb

## 2021-07-09 DIAGNOSIS — N529 Male erectile dysfunction, unspecified: Secondary | ICD-10-CM

## 2021-07-09 DIAGNOSIS — R972 Elevated prostate specific antigen [PSA]: Secondary | ICD-10-CM

## 2021-07-09 DIAGNOSIS — N401 Enlarged prostate with lower urinary tract symptoms: Secondary | ICD-10-CM | POA: Diagnosis not present

## 2021-07-09 DIAGNOSIS — N138 Other obstructive and reflux uropathy: Secondary | ICD-10-CM

## 2021-07-09 DIAGNOSIS — R319 Hematuria, unspecified: Secondary | ICD-10-CM | POA: Diagnosis not present

## 2021-07-09 LAB — BLADDER SCAN AMB NON-IMAGING: Scan Result: 0

## 2021-07-09 LAB — URINALYSIS, COMPLETE
Bilirubin, UA: NEGATIVE
Glucose, UA: NEGATIVE
Ketones, UA: NEGATIVE
Leukocytes,UA: NEGATIVE
Nitrite, UA: NEGATIVE
Protein,UA: NEGATIVE
RBC, UA: NEGATIVE
Specific Gravity, UA: 1.015 (ref 1.005–1.030)
Urobilinogen, Ur: 0.2 mg/dL (ref 0.2–1.0)
pH, UA: 5.5 (ref 5.0–7.5)

## 2021-07-09 LAB — MICROSCOPIC EXAMINATION: Bacteria, UA: NONE SEEN

## 2021-07-09 MED ORDER — SILDENAFIL CITRATE 100 MG PO TABS
100.0000 mg | ORAL_TABLET | Freq: Every day | ORAL | 3 refills | Status: DC | PRN
Start: 2021-07-09 — End: 2022-01-08

## 2021-07-10 ENCOUNTER — Ambulatory Visit: Payer: Medicare Other | Admitting: Urology

## 2021-07-10 ENCOUNTER — Telehealth: Payer: Self-pay | Admitting: Family Medicine

## 2021-07-10 LAB — PSA: Prostate Specific Ag, Serum: 1.4 ng/mL (ref 0.0–4.0)

## 2021-07-10 NOTE — Telephone Encounter (Signed)
-----   Message from Nori Riis, PA-C sent at 07/10/2021  7:59 AM EDT ----- Please let Manuel [name] know that his PSA is 1.4 which is the lowest it has been since he has been seeing [place].  This is good news!  We will see him in March.

## 2021-07-10 NOTE — Telephone Encounter (Signed)
LMOM informed of PSA result.

## 2021-08-12 ENCOUNTER — Ambulatory Visit: Payer: Medicare Other | Admitting: Family Medicine

## 2021-08-15 ENCOUNTER — Ambulatory Visit (INDEPENDENT_AMBULATORY_CARE_PROVIDER_SITE_OTHER): Payer: Medicare Other | Admitting: Unknown Physician Specialty

## 2021-08-15 ENCOUNTER — Other Ambulatory Visit: Payer: Self-pay

## 2021-08-15 VITALS — BP 118/72 | HR 78 | Temp 97.6°F | Resp 16 | Ht 70.0 in | Wt 155.0 lb

## 2021-08-15 DIAGNOSIS — Z23 Encounter for immunization: Secondary | ICD-10-CM | POA: Diagnosis not present

## 2021-08-15 DIAGNOSIS — E782 Mixed hyperlipidemia: Secondary | ICD-10-CM

## 2021-08-15 DIAGNOSIS — Z636 Dependent relative needing care at home: Secondary | ICD-10-CM | POA: Diagnosis not present

## 2021-08-15 NOTE — Assessment & Plan Note (Signed)
Not to goal last visit.  Zetia was added.  Check lipid panel

## 2021-08-15 NOTE — Progress Notes (Signed)
BP 118/72   Pulse 78   Temp 97.6 F (36.4 C)   Resp 16   Ht 5\' 10"  (1.778 m)   Wt 155 lb (70.3 kg)   SpO2 98%   BMI 22.24 kg/m    Subjective:    Patient ID: Manuel [name], male    DOB: [birth year], 65 y.o.   MRN: [id number]  HPI: Manuel [name] is a 65 y.o. male  Chief Complaint  Patient presents with   Hyperlipidemia   Pt is here [facility] f/u of his cholesterol.  He is currently taking Crestor 20 mg and Zetia daily.  Last LDL was 135  Takes Tylenol for headaches due to pressure in his head.  Admits to be under stress and constantly worrying about his wife.  Referred to social work who has reached out.    Depression screen [facility] 2/9 08/15/2021 06/19/2021 06/18/2021 04/11/2021 10/04/2020  Decreased Interest 0 0 0 0 0  Down, Depressed, Hopeless 0 0 3 0 0  PHQ - 2 Score 0 0 3 0 0  Altered sleeping 0 - 3 - 3  Tired, decreased energy 0 - 3 - 1  Change in appetite 0 - 1 - 0  Feeling bad or failure about yourself  0 - 0 - 0  Trouble concentrating 0 - 0 - 0  Moving slowly or fidgety/restless 0 - 0 - 0  Suicidal thoughts 0 - 0 - 0  PHQ-9 Score 0 - 10 - 4  Difficult doing work/chores Not difficult at all - Not difficult at all - Somewhat difficult  Some recent data might be hidden      Relevant past medical, surgical, family and social history reviewed and updated as indicated. Interim medical history since our last visit reviewed. Allergies and medications reviewed and updated.  Review of Systems  Per HPI unless specifically indicated above     Objective:    BP 118/72   Pulse 78   Temp 97.6 F (36.4 C)   Resp 16   Ht 5\' 10"  (1.778 m)   Wt 155 lb (70.3 kg)   SpO2 98%   BMI 22.24 kg/m   Wt Readings from Last 3 Encounters:  08/15/21 155 lb (70.3 kg)  07/09/21 156 lb (70.8 kg)  06/29/21 160 lb (72.6 kg)    Physical Exam Constitutional:      General: He is not in acute distress.    Appearance: Normal appearance. He is well-developed.  HENT:     Head: Normocephalic  and atraumatic.  Eyes:     General: Lids are normal. No scleral icterus.       Right eye: No discharge.        Left eye: No discharge.     Conjunctiva/sclera: Conjunctivae normal.  Neck:     Vascular: No carotid bruit or JVD.  Cardiovascular:     Rate and Rhythm: Normal rate and regular rhythm.     Heart sounds: Normal heart sounds.  Pulmonary:     Effort: Pulmonary effort is normal. No respiratory distress.     Breath sounds: Normal breath sounds.  Abdominal:     Palpations: There is no hepatomegaly or splenomegaly.  Musculoskeletal:        General: Normal range of motion.     Cervical back: Normal range of motion and neck supple.  Skin:    General: Skin is warm and dry.     Coloration: Skin is not pale.     Findings: No rash.  Neurological:     Mental Status: He is alert and oriented to person, place, and time.  Psychiatric:        Behavior: Behavior normal.        Thought Content: Thought content normal.        Judgment: Judgment normal.    Results for orders placed or performed in visit on 07/09/21  Microscopic Examination   Urine  Result Value Ref Range   WBC, UA 0-5 0 - 5 /hpf   RBC 0-2 0 - 2 /hpf   Epithelial Cells (non renal) 0-10 0 - 10 /hpf   Bacteria, UA None seen None seen/Few  Urinalysis, Complete  Result Value Ref Range   Specific Gravity, UA 1.015 1.005 - 1.030   pH, UA 5.5 5.0 - 7.5   Color, UA Yellow Yellow   Appearance Ur Clear Clear   Leukocytes,UA Negative Negative   Protein,UA Negative Negative/Trace   Glucose, UA Negative Negative   Ketones, UA Negative Negative   RBC, UA Negative Negative   Bilirubin, UA Negative Negative   Urobilinogen, Ur 0.2 0.2 - 1.0 mg/dL   Nitrite, UA Negative Negative   Microscopic Examination See below:   PSA  Result Value Ref Range   Prostate Specific Ag, Serum 1.4 0.0 - 4.0 ng/mL  Bladder Scan (Post Void Residual) in office  Result Value Ref Range   Scan Result 0       Assessment & Plan:   Problem List  Items Addressed This Visit       Unprioritized   Hyperlipidemia (Chronic)    Not to goal last visit.  Zetia was added.  Check lipid panel       Relevant Orders   Lipid panel   Caregiver stress    Pt continues to be under a great deal of stress.  Encouraged to get back in touch with Education officer, museum      Other Visit Diagnoses     Need for influenza vaccination    -  Primary   Relevant Orders   Flu Vaccine QUAD 6+ mos PF IM (Fluarix Quad PF) (Completed)        Follow up plan: Return in about 3 months (around 11/15/2021).

## 2021-08-15 NOTE — Assessment & Plan Note (Signed)
Pt continues to be under a great deal of stress.  Encouraged to get back in touch with social worker

## 2021-08-16 LAB — LIPID PANEL
Cholesterol: 213 mg/dL — ABNORMAL HIGH (ref <200)
HDL: 52 mg/dL (ref >=40)
LDL Cholesterol (Calc): 138 mg/dL (calc) — ABNORMAL HIGH
Non-HDL Cholesterol (Calc): 161 mg/dL (calc) — ABNORMAL HIGH (ref <130)
Total CHOL/HDL Ratio: 4.1 (calc) (ref <5.0)
Triglycerides: 114 mg/dL (ref <150)

## 2021-09-03 ENCOUNTER — Ambulatory Visit: Payer: Medicare Other | Admitting: Family Medicine

## 2021-11-26 ENCOUNTER — Encounter: Payer: Self-pay | Admitting: Unknown Physician Specialty

## 2021-11-26 ENCOUNTER — Ambulatory Visit (INDEPENDENT_AMBULATORY_CARE_PROVIDER_SITE_OTHER): Payer: Medicare Other | Admitting: Unknown Physician Specialty

## 2021-11-26 VITALS — BP 134/76 | HR 84 | Temp 97.6°F | Resp 16 | Ht 70.0 in | Wt 157.8 lb

## 2021-11-26 DIAGNOSIS — L0100 Impetigo, unspecified: Secondary | ICD-10-CM

## 2021-11-26 MED ORDER — DOXYCYCLINE HYCLATE 100 MG PO TABS: 100.0000 mg | ORAL_TABLET | Freq: Two times a day (BID) | ORAL | 0 refills | Status: DC

## 2021-11-26 MED ORDER — BACTROBAN NASAL 2 % NA OINT: 1.0000 "application " | TOPICAL_OINTMENT | Freq: Two times a day (BID) | NASAL | 0 refills | Status: DC

## 2021-11-26 NOTE — Progress Notes (Signed)
BP 134/76    Pulse 84    Temp 97.6 F (36.4 C) (Oral)    Resp 16    Ht 5\' 10"  (1.778 m)    Wt 157 lb 12.8 oz (71.6 kg)    SpO2 99%    BMI 22.64 kg/m    Subjective:    Patient ID: Manuel [name], male    DOB: 1956-04-30, 66 y.o.   MRN: [id number]  HPI: Manuel [name] is a 66 y.o. male  Chief Complaint  Patient presents with   Fatigue    Pt has blisters around mouth as well, swollen gland on right side of throat.   Pt states this is going on for about 2 weeks.  Went to urgent for something similar 2 weeks ago.  Received and antibiotic but not sure what it was.  Has a cough with yellow phlegm. No fever.  Robitussin and Nyquil are not helping.  Using listerine.  States he has no energy  Relevant past medical, surgical, family and social history reviewed and updated as indicated. Interim medical history since our last visit reviewed. Allergies and medications reviewed and updated.  Review of Systems  Constitutional:  Positive for fatigue. Negative for chills, diaphoresis, fever and unexpected weight change.  HENT:  Positive for facial swelling and sore throat. Negative for sinus pressure, trouble swallowing and voice change.   Respiratory:  Positive for cough. Negative for shortness of breath.   Cardiovascular:  Negative for chest pain.  Psychiatric/Behavioral:  Negative for agitation.    Per HPI unless specifically indicated above     Objective:    BP 134/76    Pulse 84    Temp 97.6 F (36.4 C) (Oral)    Resp 16    Ht 5\' 10"  (1.778 m)    Wt 157 lb 12.8 oz (71.6 kg)    SpO2 99%    BMI 22.64 kg/m   Wt Readings from Last 3 Encounters:  11/26/21 157 lb 12.8 oz (71.6 kg)  08/15/21 155 lb (70.3 kg)  07/09/21 156 lb (70.8 kg)    Physical Exam Constitutional:      General: He is not in acute distress.    Appearance: Normal appearance. He is well-developed.  HENT:     Head: Normocephalic and atraumatic.     Mouth/Throat:     Comments: Right corner of mouth swollen with some  purulent drainage noted.  Swelling right jaw Eyes:     General: Lids are normal. No scleral icterus.       Right eye: No discharge.        Left eye: No discharge.     Conjunctiva/sclera: Conjunctivae normal.  Neck:     Vascular: No carotid bruit or JVD.  Cardiovascular:     Rate and Rhythm: Normal rate and regular rhythm.     Heart sounds: Normal heart sounds.  Pulmonary:     Effort: Pulmonary effort is normal. No respiratory distress.     Breath sounds: Normal breath sounds. No wheezing, rhonchi or rales.  Chest:     Chest wall: No tenderness.  Abdominal:     Palpations: There is no hepatomegaly or splenomegaly.  Musculoskeletal:        General: Normal range of motion.     Cervical back: Normal range of motion and neck supple.  Lymphadenopathy:     Cervical: Cervical adenopathy present.  Skin:    General: Skin is warm and dry.     Coloration: Skin is not  pale.     Findings: No rash.  Neurological:     Mental Status: He is alert and oriented to person, place, and time.  Psychiatric:        Behavior: Behavior normal.        Thought Content: Thought content normal.        Judgment: Judgment normal.    Results for orders placed or performed in visit on 08/15/21  Lipid panel  Result Value Ref Range   Cholesterol 213 (H) <200 mg/dL   HDL 52 > OR = 40 mg/dL   Triglycerides 114 <150 mg/dL   LDL Cholesterol (Calc) 138 (H) mg/dL (calc)   Total CHOL/HDL Ratio 4.1 <5.0 (calc)   Non-HDL Cholesterol (Calc) 161 (H) <130 mg/dL (calc)      Assessment & Plan:   Problem List Items Addressed This Visit   None Visit Diagnoses     Impetigo    -  Primary   Not sure of previous antibiotic from Urgent care.  Will cover for MRSA and Rx Doxycycline and Bactroban.  Pen allergic.  Wear mask at work.       Hx of smoking.  If continued problems refer to ENT  Follow up plan: Return if symptoms worsen or fail to improve.

## 2021-12-03 ENCOUNTER — Ambulatory Visit: Payer: Medicare Other | Admitting: Unknown Physician Specialty

## 2021-12-17 ENCOUNTER — Encounter: Payer: Self-pay | Admitting: Internal Medicine

## 2021-12-17 ENCOUNTER — Ambulatory Visit (INDEPENDENT_AMBULATORY_CARE_PROVIDER_SITE_OTHER): Payer: Medicare Other | Admitting: Internal Medicine

## 2021-12-17 VITALS — BP 122/84 | HR 74 | Temp 98.0°F | Resp 18 | Ht 70.0 in | Wt 157.4 lb

## 2021-12-17 DIAGNOSIS — Z122 Encounter for screening for malignant neoplasm of respiratory organs: Secondary | ICD-10-CM

## 2021-12-17 DIAGNOSIS — Z23 Encounter for immunization: Secondary | ICD-10-CM

## 2021-12-17 DIAGNOSIS — E782 Mixed hyperlipidemia: Secondary | ICD-10-CM

## 2021-12-17 DIAGNOSIS — N183 Chronic kidney disease, stage 3 unspecified: Secondary | ICD-10-CM | POA: Diagnosis not present

## 2021-12-17 DIAGNOSIS — N400 Enlarged prostate without lower urinary tract symptoms: Secondary | ICD-10-CM

## 2021-12-17 DIAGNOSIS — K118 Other diseases of salivary glands: Secondary | ICD-10-CM | POA: Diagnosis not present

## 2021-12-17 MED ORDER — ROSUVASTATIN CALCIUM 20 MG PO TABS: 20.0000 mg | ORAL_TABLET | Freq: Every day | ORAL | 3 refills | Status: DC

## 2021-12-17 NOTE — Assessment & Plan Note (Signed)
Seeing Urology, no current symptoms or medications.

## 2021-12-17 NOTE — Assessment & Plan Note (Signed)
Stable, reviewed last set of labs.

## 2021-12-17 NOTE — Assessment & Plan Note (Signed)
Supposedly restarted statin in October, will repeat lipid panel when he is fasting. Continue statin.

## 2021-12-17 NOTE — Progress Notes (Signed)
Established Patient Office Visit  Subjective:  Patient ID: Manuel [name], male    DOB: 1956-02-16  Age: 66 y.o. MRN: [id number]  CC:  Chief Complaint  Patient presents with   Follow-up   Hyperlipidemia    HPI [name] presents for follow up on chronic medical conditions. Had been seen at last office visit in January for bilateral parotid mass/swelling. Had this for about 2 months now. Was treated with antibiotics but this did not affect the swelling. Has not had imaging. Denies fevers, weight loss. Continues to smoke.   HLD/CAD: -Medications: Crestor 20 - had not been taking in the past but states he restarted in October after receiving lab results below   -Patient is compliant with above medications and reports no side effects.  -Last lipid panel: Lipid Panel     Component Value Date/Time   CHOL 213 (H) 08/15/2021 1107   CHOL 208 (H) 11/02/2015 1303   TRIG 114 08/15/2021 1107   HDL 52 08/15/2021 1107   HDL 47 11/02/2015 1303   CHOLHDL 4.1 08/15/2021 1107   VLDL 29 01/29/2017 1450   LDLCALC 138 (H) 08/15/2021 1107   LABVLDL 49 (H) 11/02/2015 1303   The 10-year ASCVD risk score (Arnett DK, et al., 2019) is: 16.7%   Values used to calculate the score:     Age: 4 years     Sex: Male     Is Non-Hispanic African American: Yes     Diabetic: No     Tobacco smoker: Yes     Systolic Blood Pressure: 846 mmHg     Is BP treated: No     HDL Cholesterol: 52 mg/dL     Total Cholesterol: 213 mg/dL  CKD3: -Last CMP 6/22 showing creatinine 1.16  BPH: -Hx of elevated PSA's in the past, last in 9/22 was 1.4 -Seeing Urology   Health Maintenance: -Blood work up to date, repeat lipid panel -Colonoscopy 6/22, repeat in 3 years -Lung cancer screening: CT 3/22 LUNG-RADS-2 , repeat this year  Past Medical History:  Diagnosis Date   Anemia    Anxiety    Aortic atherosclerosis ([place]) 01/11/2018   CT scan Feb 2019   Asthma    Bilateral carpal tunnel syndrome    Bilateral  hand numbness    BPH without urinary obstruction    Cervical spondylosis without myelopathy    Chronic kidney disease    STAGE 3   Complete tear of rotator cuff    bilateral   COPD (chronic obstructive pulmonary disease) ([place])    Depression    Emphysema lung ([place]) 01/11/2018   Chest CT Feb 2019   History of stomach ulcers    HLD (hyperlipidemia)    LVH (left ventricular hypertrophy)    Personal history of tobacco use, presenting hazards to health 12/21/2015   Poor dentition    Sickle cell trait ([place])    Sleep apnea    Smoker     Past Surgical History:  Procedure Laterality Date   CARPAL TUNNEL RELEASE Left    COLONOSCOPY WITH PROPOFOL N/A 12/25/2015   Procedure: COLONOSCOPY WITH PROPOFOL;  Surgeon: Lucilla Lame, MD;  Location: [facility];  Service: Endoscopy;  Laterality: N/A;   COLONOSCOPY WITH PROPOFOL N/A 05/02/2021   Procedure: COLONOSCOPY WITH PROPOFOL;  Surgeon: Lucilla Lame, MD;  Location: [place];  Service: Endoscopy;  Laterality: N/A;   CYSTOURETHROSCOPY  08/25/12   with ureteral cathization w/wo retrograde pyelogram   HOLEP-LASER ENUCLEATION OF THE PROSTATE WITH  MORCELLATION N/A 04/05/2021   Procedure: HOLEP-LASER ENUCLEATION OF THE PROSTATE WITH MORCELLATION;  Surgeon: Billey Co, MD;  Location: [facility];  Service: Urology;  Laterality: N/A;   POLYPECTOMY N/A 05/02/2021   Procedure: POLYPECTOMY;  Surgeon: Lucilla Lame, MD;  Location: [place];  Service: Endoscopy;  Laterality: N/A;   ROTATOR CUFF REPAIR Right 01/25/15   SHOULDER ARTHROSCOPY WITH SUBACROMIAL DECOMPRESSION Right 032416   TRANSURETHRAL RESECTION OF PROSTATE  08/25/12    Family History  Problem Relation Age of Onset   Aneurysm Mother    Hypertension Mother    Diabetes Father    Hypertension Father    Prostate cancer Father    Lupus Father    Cancer Maternal Aunt    Cancer Maternal Uncle    Diabetes Paternal Uncle    Heart disease Cousin    Prostate cancer Paternal  Uncle    COPD Neg Hx    Stroke Neg Hx    Kidney disease Neg Hx     Social History   Socioeconomic History   Marital status: Married    Spouse name: Not on file   Number of children: Not on file   Years of education: Not on file   Highest education level: Not on file  Occupational History   Not on file  Tobacco Use   Smoking status: Every Day    Packs/day: 0.50    Years: 35.00    Pack years: 17.50    Types: Cigarettes   Smokeless tobacco: Never   Tobacco comments:    1-2 PPD for most of his 64 yr hx smoking, > 30 packyear  Vaping Use   Vaping Use: Never used  Substance and Sexual Activity   Alcohol use: No   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   Sexual activity: Yes  Other Topics Concern   Not on file  Social History Narrative   Not on file   Social Determinants of Health   Financial Resource Strain: Medium Risk   Difficulty of Paying Living Expenses: Somewhat hard  Food Insecurity: No Food Insecurity   Worried About Charity fundraiser in the Last Year: Never true   Ran Out of Food in the Last Year: Never true  Transportation Needs: No Transportation Needs   Lack of Transportation (Medical): No   Lack of Transportation (Non-Medical): No  Physical Activity: Inactive   Days of Exercise per Week: 0 days   Minutes of Exercise per Session: 0 min  Stress: No Stress Concern Present   Feeling of Stress : Only a little  Social Connections: Moderately Isolated   Frequency of Communication with Friends and Family: More than three times a week   Frequency of Social Gatherings with Friends and Family: Three times a week   Attends Religious Services: Never   Active Member of Clubs or Organizations: No   Attends Archivist Meetings: Never   Marital Status: Married  Human resources officer Violence: Not At Risk   Fear of Current or Ex-Partner: No   Emotionally Abused: No   Physically Abused: No   Sexually Abused: No    Outpatient Medications Prior to  Visit  Medication Sig Dispense Refill   diclofenac Sodium (VOLTAREN) 1 % GEL Apply 1 application topically 4 (four) times daily.     diphenhydramine-acetaminophen (TYLENOL PM) 25-500 MG TABS tablet Take 2 tablets by mouth at bedtime as needed (pain).     doxycycline (VIBRA-TABS) 100 MG tablet Take  1 tablet (100 mg total) by mouth 2 (two) times daily. 20 tablet 0   ezetimibe (ZETIA) 10 MG tablet Take 10 mg by mouth See admin instructions. 2 times a month     mupirocin nasal ointment (BACTROBAN NASAL) 2 % Place 1 application into the nose 2 (two) times daily. Put on lip BID 10 g 0   rosuvastatin (CRESTOR) 20 MG tablet Take 1 tablet (20 mg total) by mouth at bedtime. 90 tablet 3   sildenafil (VIAGRA) 100 MG tablet Take 1 tablet (100 mg total) by mouth daily as needed for erectile dysfunction. Take two hours prior to intercourse on an empty stomach 30 tablet 3   tamsulosin (FLOMAX) 0.4 MG CAPS capsule Take 0.4 mg by mouth.     vitamin B-12 (CYANOCOBALAMIN) 500 MCG tablet Take 2 tablets (1,000 mcg total) by mouth daily. 30 tablet 11   Facility-Administered Medications Prior to Visit  Medication Dose Route Frequency Provider Last Rate Last Admin   cyanocobalamin ((VITAMIN B-12)) injection 1,000 mcg  1,000 mcg Intramuscular Q30 days Lada, Satira Anis, MD        Allergies  Allergen Reactions   Penicillin G Other (See Comments)    Childhood    ROS Review of Systems  Constitutional:  Positive for fatigue. Negative for chills, fever and unexpected weight change.  Respiratory:  Negative for cough and shortness of breath.   Cardiovascular:  Negative for chest pain.  Gastrointestinal:  Negative for abdominal pain.     Objective:    Physical Exam Constitutional:      Appearance: Normal appearance.  HENT:     Head: Normocephalic and atraumatic.     Comments: Bilateral submandibular mass, non-painful, hard and fixed. Left about 1 cm in diameter, right slightly smaller.  Eyes:      Conjunctiva/sclera: Conjunctivae normal.  Cardiovascular:     Rate and Rhythm: Normal rate and regular rhythm.  Pulmonary:     Effort: Pulmonary effort is normal.     Breath sounds: Normal breath sounds.  Musculoskeletal:     Right lower leg: No edema.     Left lower leg: No edema.  Skin:    General: Skin is warm and dry.  Neurological:     General: No focal deficit present.     Mental Status: He is alert. Mental status is at baseline.  Psychiatric:        Mood and Affect: Mood normal.        Behavior: Behavior normal.    BP 122/84    Pulse 74    Temp 98 F (36.7 C)    Resp 18    Ht 5\' 10"  (1.778 m)    Wt 157 lb 6.4 oz (71.4 kg)    SpO2 98%    BMI 22.58 kg/m  Wt Readings from Last 3 Encounters:  12/17/21 157 lb 6.4 oz (71.4 kg)  11/26/21 157 lb 12.8 oz (71.6 kg)  08/15/21 155 lb (70.3 kg)     Health Maintenance Due  Topic Date Due   Zoster Vaccines- Shingrix (1 of 2) Never done   COVID-19 Vaccine (3 - Booster for Pfizer series) 05/09/2021    There are no preventive care reminders to display for this patient.  Lab Results  Component Value Date   TSH 0.842 01/10/2016   Lab Results  Component Value Date   WBC 6.7 04/11/2021   HGB 13.0 (L) 04/11/2021   HCT 39.9 04/11/2021   MCV 76.1 (L) 04/11/2021   PLT 281  04/11/2021   Lab Results  Component Value Date   NA 142 04/11/2021   K 4.1 04/11/2021   CO2 32 04/11/2021   GLUCOSE 80 04/11/2021   BUN 10 04/11/2021   CREATININE 1.16 04/11/2021   BILITOT 0.3 04/11/2021   ALKPHOS 60 01/29/2017   AST 9 (L) 04/11/2021   ALT 7 (L) 04/11/2021   PROT 6.3 04/11/2021   ALBUMIN 3.7 01/29/2017   CALCIUM 9.1 04/11/2021   Lab Results  Component Value Date   CHOL 213 (H) 08/15/2021   Lab Results  Component Value Date   HDL 52 08/15/2021   Lab Results  Component Value Date   LDLCALC 138 (H) 08/15/2021   Lab Results  Component Value Date   TRIG 114 08/15/2021   Lab Results  Component Value Date   CHOLHDL 4.1  08/15/2021   Lab Results  Component Value Date   HGBA1C 5.8 10/30/2020      Assessment & Plan:   Problem List Items Addressed This Visit       Genitourinary   BPH (benign prostatic hyperplasia)    Seeing Urology, no current symptoms or medications.      Chronic kidney disease, stage III (moderate) (HCC)    Stable, reviewed last set of labs.         Other   Hyperlipidemia (Chronic)    Supposedly restarted statin in October, will repeat lipid panel when he is fasting. Continue statin.       Relevant Medications   rosuvastatin (CRESTOR) 20 MG tablet   Other Relevant Orders   Lipid Profile   Other Visit Diagnoses     Submandibular gland mass    -  Primary - concerned with no change with antibiotics and current smoker. Obtain CT to evaluate for malignancy. Referral placed to ENT for possible biopsy.    Relevant Orders   CT SOFT TISSUE NECK W WO CONTRAST   Ambulatory referral to ENT   Screening for lung cancer       Relevant Orders   CT CHEST LUNG CA SCREEN LOW DOSE W/O CM   Need for shingles vaccine       Relevant Orders   Varicella-zoster vaccine IM (Shingrix) (Completed)       Meds ordered this encounter  Medications   rosuvastatin (CRESTOR) 20 MG tablet    Sig: Take 1 tablet (20 mg total) by mouth at bedtime.    Dispense:  90 tablet    Refill:  3    Follow-up: Return in about 4 months (around 04/16/2022).    Teodora Medici, DO

## 2021-12-17 NOTE — Addendum Note (Signed)
Addended by: Teodora Medici on: 12/17/2021 12:56 PM   Modules accepted: Orders

## 2021-12-17 NOTE — Patient Instructions (Addendum)
It was great seeing you today!  Plan discussed at today's visit: -Blood work ordered today, results will be uploaded to [place]. Come in tomorrow morning but FASTING for as least 8-12 hours, no fat or sugar -CT of neck ordered today as well as referral to ENT for evaluation  -Lung Cancer screening ordered -Cholesterol medication refilled   Follow up in: 4 months   Take care and let us know if you have any questions or concerns prior to your next visit.  Dr. Rosana Berger

## 2021-12-18 LAB — LIPID PANEL
Cholesterol: 165 mg/dL (ref <200)
HDL: 45 mg/dL (ref >=40)
LDL Cholesterol (Calc): 103 mg/dL (calc) — ABNORMAL HIGH
Non-HDL Cholesterol (Calc): 120 mg/dL (calc) (ref <130)
Total CHOL/HDL Ratio: 3.7 (calc) (ref <5.0)
Triglycerides: 83 mg/dL (ref <150)

## 2021-12-31 ENCOUNTER — Telehealth: Payer: Self-pay | Admitting: Family Medicine

## 2021-12-31 DIAGNOSIS — K118 Other diseases of salivary glands: Secondary | ICD-10-CM

## 2021-12-31 NOTE — Telephone Encounter (Signed)
Elberta Fortis from Radiology states that the order for tomorrows CT needs to be changed to With Contrast per Merrilee Seashore in Radiology.   Best contact: [phone]

## 2022-01-01 ENCOUNTER — Ambulatory Visit
Admission: RE | Admit: 2022-01-01 | Discharge: 2022-01-01 | Disposition: A | Discharge status: Home / Self Care | Payer: Medicare Other | Source: Ambulatory Visit | Attending: Internal Medicine | Admitting: Internal Medicine

## 2022-01-01 ENCOUNTER — Other Ambulatory Visit: Payer: Self-pay

## 2022-01-01 DIAGNOSIS — K118 Other diseases of salivary glands: Secondary | ICD-10-CM | POA: Insufficient documentation

## 2022-01-01 LAB — POCT I-STAT CREATININE: Creatinine, Ser: 1.5 mg/dL — ABNORMAL HIGH (ref 0.61–1.24)

## 2022-01-01 MED ORDER — IOHEXOL 300 MG/ML  SOLN
75.0000 mL | Freq: Once | INTRAMUSCULAR | Status: AC | PRN
  Administered 2022-01-01: 75 mL via INTRAVENOUS

## 2022-01-02 ENCOUNTER — Other Ambulatory Visit: Payer: Medicare Other

## 2022-01-02 DIAGNOSIS — R972 Elevated prostate specific antigen [PSA]: Secondary | ICD-10-CM

## 2022-01-03 LAB — PSA: Prostate Specific Ag, Serum: 1.4 ng/mL (ref 0.0–4.0)

## 2022-01-07 NOTE — Progress Notes (Unsigned)
01/08/2022  10:07 AM   Manuel [name] 01-02-1956 [id number]  Referring provider: Delsa Grana, PA-C [address] [place],  [place] [address]  Chief Complaint  Patient presents with   Erectile Dysfunction   Benign Prostatic Hypertrophy    Urological history: 1. Elevated PSA -PSA Trend Component     Latest Ref Rng & Units 03/22/2018 11/30/2018 12/23/2018 03/29/2019  Prostate Specific Ag, Serum     0.0 - 4.0 ng/mL 5.1 (H) 5.3 (H) 5.1 (H) 5.6 (H)   Component     Latest Ref Rng & Units 05/15/2020 11/12/2020 07/09/2021 01/02/2022  Prostate Specific Ag, Serum     0.0 - 4.0 ng/mL 5.2 (H) 5.5 (H) 1.4 1.4   -Prostate MRI on 07/25/2019 revealed a small PI-RADS category 4 lesion in the right posterolateral peripheral zone of the prostate apex is present. Targeting data sent to [place].  Considerable benign prostatic hypertrophy, prostate volume 78.53 cubic cm.  Thin peripheral zone with generalized low T2 signal, probably postinflammatory.  Sigmoid colon diverticulosis. 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 BPH with LU TS -I PSS 3/3 -s/p HoLEP 04/2021  3. High risk hematuria -smoker -work up in 2013.  He underwent a CT Urogram, cystoscopy with bilateral retrogrades and ultmately a TURP with Dr. Darcus Austin at [facility].  No malignancies were discovered.  Hematuria was from his enlarged, friable prostate.  He has not had any gross hematuria since that time.  A too small to characterize lesion was seen in the left midpole of the kidney on the CT Urogram in 2013.   RUS 05/2019 Increased echogenicity in the cortex of both kidneys is consistent with medical renal disease. No other acute abnormalities identified -cysto 10/2019 no malignancy found  -CTU 2022 and cysto 2022 negative for malignancy -no reports of gross hematuria -UA negative for micro heme  4. ED -contributing factors of age, smoking, BPH, CAD, HLD, depression  and antidepressents -SHIM 11 -taking sildenafil 20 mg, 3 to 5 tablets prior to intercourse  Chief Complaint  Patient presents with   Erectile Dysfunction   Benign Prostatic Hypertrophy     HPI: Manuel [name] is a 66 y.o.  male who presents today for a 6 month follow up.  He has no urinary complaints at this visit.  Patient denies any modifying or aggravating factors.  Patient denies any gross hematuria, dysuria or suprapubic/flank pain.  Patient denies any fevers, chills, nausea or vomiting.     IPSS     Row Name 01/08/22 1000         International Prostate Symptom Score   How often have you had the sensation of not emptying your bladder? Not at All     How often have you had to urinate less than every two hours? Less than half the time     How often have you found you stopped and started again several times when you urinated? Not at All     How often have you found it difficult to postpone urination? Not at All     How often have you had a weak urinary stream? Not at All     How often have you had to strain to start urination? Not at All     How many times did you typically get up at night to urinate? 1 Time     Total IPSS Score 3       Quality of Life  due to urinary symptoms   If you were to spend the rest of your life with your urinary condition just the way it is now how would you feel about that? Mixed                Score:  1-7 Mild 8-19 Moderate 20-35 Severe  Patient still having spontaneous erections.  He denies any pain or curvature with erections.   He has no libido as he works full time and is the sole care giver for his wife.    SHIM     Row Name 01/08/22 1002         SHIM: Over the last 6 months:   How do you rate your confidence that you could get and keep an erection? Low     When you had erections with sexual stimulation, how often were your erections hard enough for penetration (entering your partner)? A Few Times (much less than half the  time)     During sexual intercourse, how often were you able to maintain your erection after you had penetrated (entered) your partner? Almost Never or Never     During sexual intercourse, how difficult was it to maintain your erection to completion of intercourse? Slightly Difficult     When you attempted sexual intercourse, how often was it satisfactory for you? A Few Times (much less than half the time)       SHIM Total Score   SHIM 11               Score: 1-7 Severe ED 8-11 Moderate ED 12-16 Mild-Moderate ED 17-21 Mild ED 22-25 No ED       PMH: Past Medical History:  Diagnosis Date   Anemia    Anxiety    Aortic atherosclerosis ([place]) 01/11/2018   CT scan Feb 2019   Asthma    Bilateral carpal tunnel syndrome    Bilateral hand numbness    BPH without urinary obstruction    Cervical spondylosis without myelopathy    Chronic kidney disease    STAGE 3   Complete tear of rotator cuff    bilateral   COPD (chronic obstructive pulmonary disease) ([place])    Depression    Emphysema lung ([place]) 01/11/2018   Chest CT Feb 2019   History of stomach ulcers    HLD (hyperlipidemia)    LVH (left ventricular hypertrophy)    Personal history of tobacco use, presenting hazards to health 12/21/2015   Poor dentition    Sickle cell trait (HCC)    Sleep apnea    Smoker     Surgical History: Past Surgical History:  Procedure Laterality Date   CARPAL TUNNEL RELEASE Left    COLONOSCOPY WITH PROPOFOL N/A 12/25/2015   Procedure: COLONOSCOPY WITH PROPOFOL;  Surgeon: Lucilla Lame, MD;  Location: [facility];  Service: Endoscopy;  Laterality: N/A;   COLONOSCOPY WITH PROPOFOL N/A 05/02/2021   Procedure: COLONOSCOPY WITH PROPOFOL;  Surgeon: Lucilla Lame, MD;  Location: [place];  Service: Endoscopy;  Laterality: N/A;   CYSTOURETHROSCOPY  08/25/12   with ureteral cathization w/wo retrograde pyelogram   HOLEP-LASER ENUCLEATION OF THE PROSTATE WITH MORCELLATION N/A 04/05/2021    Procedure: HOLEP-LASER ENUCLEATION OF THE PROSTATE WITH MORCELLATION;  Surgeon: Billey Co, MD;  Location: [facility];  Service: Urology;  Laterality: N/A;   POLYPECTOMY N/A 05/02/2021   Procedure: POLYPECTOMY;  Surgeon: Lucilla Lame, MD;  Location: [place];  Service: Endoscopy;  Laterality: N/A;   ROTATOR  CUFF REPAIR Right 01/25/15   SHOULDER ARTHROSCOPY WITH SUBACROMIAL DECOMPRESSION Right [id number]   TRANSURETHRAL RESECTION OF PROSTATE  08/25/12    Home Medications:  Allergies as of 01/08/2022       Reactions   Penicillin G Other (See Comments)   Childhood        Medication List        Accurate as of January 08, 2022 10:07 AM. If you have any questions, ask your nurse or doctor.          STOP taking these medications    Bactroban Nasal 2 % Generic drug: mupirocin nasal ointment Stopped by: Johndavid Geralds, PA-C   diclofenac Sodium 1 % Gel Commonly known as: VOLTAREN Stopped by: Tahlor Berenguer, PA-C   doxycycline 100 MG tablet Commonly known as: VIBRA-TABS Stopped by: Yosiel Thieme, PA-C   sildenafil 100 MG tablet Commonly known as: VIAGRA Stopped by: Joliene Salvador, PA-C   tamsulosin 0.4 MG Caps capsule Commonly known as: FLOMAX Stopped by: Gayanne Prescott, PA-C       TAKE these medications    diphenhydramine-acetaminophen 25-500 MG Tabs tablet Commonly known as: TYLENOL PM Take 2 tablets by mouth at bedtime as needed (pain).   ezetimibe 10 MG tablet Commonly known as: ZETIA Take 10 mg by mouth See admin instructions. 2 times a month   rosuvastatin 20 MG tablet Commonly known as: Crestor Take 1 tablet (20 mg total) by mouth at bedtime.   vitamin B-12 500 MCG tablet Commonly known as: CYANOCOBALAMIN Take 2 tablets (1,000 mcg total) by mouth daily.        Allergies:  Allergies  Allergen Reactions   Penicillin G Other (See Comments)    Childhood    Family History: Family History  Problem Relation Age of Onset   Aneurysm  Mother    Hypertension Mother    Diabetes Father    Hypertension Father    Prostate cancer Father    Lupus Father    Cancer Maternal Aunt    Cancer Maternal Uncle    Diabetes Paternal Uncle    Heart disease Cousin    Prostate cancer Paternal Uncle    COPD Neg Hx    Stroke Neg Hx    Kidney disease Neg Hx     Social History:  reports that he has been smoking cigarettes. He has a 17.50 pack-year smoking history. He has never used smokeless tobacco. He reports that he does not drink alcohol and does not use drugs.  ROS: For pertinent review of systems please refer to history of present illness  Physical Exam: BP 122/77    Pulse 72    Ht '5\' 10"'$  (1.778 m)    Wt 157 lb (71.2 kg)    SpO2 97%    BMI 22.53 kg/m   Constitutional:  Well nourished. Alert and oriented, No acute distress. HEENT: [place] AT, mask in place.  Trachea midline Cardiovascular: No clubbing, cyanosis, or edema. Respiratory: Normal respiratory effort, no increased work of breathing. GU: No CVA tenderness.  No bladder fullness or masses.  Patient with circumcised phallus.  Urethral meatus is patent.  No penile discharge. No penile lesions or rashes. Scrotum without lesions, cysts, rashes and/or edema.  Testicles are located scrotally bilaterally. No masses are appreciated in the testicles. Left and right epididymis are normal. Rectal: Patient with  normal sphincter tone. Anus and perineum without scarring or rashes. No rectal masses are appreciated. Prostate is approximately 50 grams, could only palpate the apex and midportion of  the gland, no nodules are appreciated. Seminal vesicles could not be palpated Neurologic: Grossly intact, no focal deficits, moving all 4 extremities. Psychiatric: Normal mood and affect.   Laboratory Data: Component     Latest Ref Rng & Units 12/23/2018 03/29/2019 05/15/2020 11/12/2020  Prostate Specific Ag, Serum     0.0 - 4.0 ng/mL 5.1 (H) 5.6 (H) 5.2 (H) 5.5 (H)   Component     Latest Ref Rng &  Units 07/09/2021 01/02/2022  Prostate Specific Ag, Serum     0.0 - 4.0 ng/mL 1.4 1.4   Component     Latest Ref Rng & Units 12/18/2021  Cholesterol     <200 mg/dL 165  HDL Cholesterol     > OR = 40 mg/dL 45  Triglycerides     <150 mg/dL 83  LDL Cholesterol (Calc)     mg/dL (calc) 103 (H)  Total CHOL/HDL Ratio     <5.0 (calc) 3.7  Non-HDL Cholesterol (Calc)     <130 mg/dL (calc) 120   Urinalysis ***   I have reviewed the labs.  Pertinent Imaging N/A   Assessment & Plan:    1. BPH with LU TS -PSA normal  -prostate chips 04/2021-negative for malignancy  -DRE benign -UA benign -PVR < 300 cc -symptoms - none  2. Erectile dysfunction -continue sildenafil 20 mg, on-demand-dosing  3. High risk hematuria -work up completed in 2013 and 2022 - positive for an enlarged prostate -no reports of gross hematuria -UA negative for micro heme  No follow-ups on file.  Zara Council, PA-C [facility] [address], [place], [place] [address] [phone]

## 2022-01-08 ENCOUNTER — Encounter: Payer: Self-pay | Admitting: Urology

## 2022-01-08 ENCOUNTER — Ambulatory Visit (INDEPENDENT_AMBULATORY_CARE_PROVIDER_SITE_OTHER): Payer: Medicare Other | Admitting: Urology

## 2022-01-08 ENCOUNTER — Other Ambulatory Visit: Payer: Self-pay

## 2022-01-08 VITALS — BP 122/77 | HR 72 | Ht 70.0 in | Wt 157.0 lb

## 2022-01-08 DIAGNOSIS — N138 Other obstructive and reflux uropathy: Secondary | ICD-10-CM | POA: Diagnosis not present

## 2022-01-08 DIAGNOSIS — N529 Male erectile dysfunction, unspecified: Secondary | ICD-10-CM | POA: Diagnosis not present

## 2022-01-08 DIAGNOSIS — R319 Hematuria, unspecified: Secondary | ICD-10-CM | POA: Diagnosis not present

## 2022-01-08 DIAGNOSIS — N401 Enlarged prostate with lower urinary tract symptoms: Secondary | ICD-10-CM | POA: Diagnosis not present

## 2022-01-08 LAB — URINALYSIS, COMPLETE
Bilirubin, UA: NEGATIVE
Glucose, UA: NEGATIVE
Ketones, UA: NEGATIVE
Leukocytes,UA: NEGATIVE
Nitrite, UA: NEGATIVE
Protein,UA: NEGATIVE
RBC, UA: NEGATIVE
Specific Gravity, UA: 1.025 (ref 1.005–1.030)
Urobilinogen, Ur: 1 mg/dL (ref 0.2–1.0)
pH, UA: 5.5 (ref 5.0–7.5)

## 2022-01-08 LAB — MICROSCOPIC EXAMINATION

## 2022-01-08 NOTE — Patient Instructions (Signed)
Follow up in 1 year. ? ? ? ?Please call and ask to speak with a nurse if you develop questions or concerns. ? ?

## 2022-02-10 ENCOUNTER — Other Ambulatory Visit: Payer: Self-pay | Admitting: *Deleted

## 2022-02-10 DIAGNOSIS — Z122 Encounter for screening for malignant neoplasm of respiratory organs: Secondary | ICD-10-CM

## 2022-02-10 DIAGNOSIS — Z87891 Personal history of nicotine dependence: Secondary | ICD-10-CM

## 2022-02-10 DIAGNOSIS — F17210 Nicotine dependence, cigarettes, uncomplicated: Secondary | ICD-10-CM

## 2022-02-20 ENCOUNTER — Ambulatory Visit
Admission: RE | Admit: 2022-02-20 | Discharge: 2022-02-20 | Disposition: A | Discharge status: Home / Self Care | Payer: Medicare Other | Source: Ambulatory Visit | Attending: Acute Care | Admitting: Acute Care

## 2022-02-20 DIAGNOSIS — F17210 Nicotine dependence, cigarettes, uncomplicated: Secondary | ICD-10-CM

## 2022-02-20 DIAGNOSIS — Z87891 Personal history of nicotine dependence: Secondary | ICD-10-CM | POA: Diagnosis present

## 2022-02-20 DIAGNOSIS — Z122 Encounter for screening for malignant neoplasm of respiratory organs: Secondary | ICD-10-CM | POA: Insufficient documentation

## 2022-02-21 ENCOUNTER — Other Ambulatory Visit: Payer: Self-pay | Admitting: Acute Care

## 2022-02-21 DIAGNOSIS — Z87891 Personal history of nicotine dependence: Secondary | ICD-10-CM

## 2022-02-21 DIAGNOSIS — F17210 Nicotine dependence, cigarettes, uncomplicated: Secondary | ICD-10-CM

## 2022-02-21 DIAGNOSIS — Z122 Encounter for screening for malignant neoplasm of respiratory organs: Secondary | ICD-10-CM

## 2022-02-25 ENCOUNTER — Ambulatory Visit: Admission: RE | Admit: 2022-02-25 | Payer: Medicare Other | Source: Ambulatory Visit

## 2022-04-14 ENCOUNTER — Encounter: Payer: Self-pay | Admitting: Family Medicine

## 2022-04-14 ENCOUNTER — Other Ambulatory Visit: Payer: Self-pay

## 2022-04-14 ENCOUNTER — Ambulatory Visit (INDEPENDENT_AMBULATORY_CARE_PROVIDER_SITE_OTHER): Payer: Medicare Other | Admitting: Family Medicine

## 2022-04-14 VITALS — BP 120/80 | HR 81 | Temp 97.9°F | Resp 16 | Ht 70.0 in | Wt 152.5 lb

## 2022-04-14 DIAGNOSIS — E538 Deficiency of other specified B group vitamins: Secondary | ICD-10-CM

## 2022-04-14 DIAGNOSIS — E782 Mixed hyperlipidemia: Secondary | ICD-10-CM

## 2022-04-14 DIAGNOSIS — I2510 Atherosclerotic heart disease of native coronary artery without angina pectoris: Secondary | ICD-10-CM

## 2022-04-14 DIAGNOSIS — I517 Cardiomegaly: Secondary | ICD-10-CM | POA: Diagnosis not present

## 2022-04-14 DIAGNOSIS — Z636 Dependent relative needing care at home: Secondary | ICD-10-CM

## 2022-04-14 DIAGNOSIS — R972 Elevated prostate specific antigen [PSA]: Secondary | ICD-10-CM

## 2022-04-14 DIAGNOSIS — R634 Abnormal weight loss: Secondary | ICD-10-CM

## 2022-04-14 DIAGNOSIS — J439 Emphysema, unspecified: Secondary | ICD-10-CM

## 2022-04-14 DIAGNOSIS — N400 Enlarged prostate without lower urinary tract symptoms: Secondary | ICD-10-CM

## 2022-04-14 DIAGNOSIS — F17200 Nicotine dependence, unspecified, uncomplicated: Secondary | ICD-10-CM

## 2022-04-14 DIAGNOSIS — N183 Chronic kidney disease, stage 3 unspecified: Secondary | ICD-10-CM

## 2022-04-14 DIAGNOSIS — F3342 Major depressive disorder, recurrent, in full remission: Secondary | ICD-10-CM

## 2022-04-14 NOTE — Assessment & Plan Note (Signed)
Counseled on tobacco cessation 

## 2022-04-14 NOTE — Assessment & Plan Note (Signed)
Doing well off flomax. Continue to follow with Urology.

## 2022-04-14 NOTE — Assessment & Plan Note (Addendum)
Recheck labs. May consider addition of ARB if BP able to tolerate.

## 2022-04-14 NOTE — Assessment & Plan Note (Signed)
Last PSA wnl. Continue to follow with Urology.

## 2022-04-14 NOTE — Assessment & Plan Note (Addendum)
Few lb weight loss in a few months. Some loss of appetite but otherwise asymptomatic. UTD with cancer screenings. Does have h/o depression with considerable stress that he attributes to his loss of appetite. Counseled on stress management, f/u in 3 months, recommend repeat weight and PHQ9 at that time. Obtaining labs today.

## 2022-04-14 NOTE — Patient Instructions (Signed)
It was great to see you!  Our plans for today:  - No changes to your medications. Try to place your cholesterol medication near something you use daily to help you remember to take them (like your toothbrush or coffee maker).   We are checking some labs today, we will release these results to your MyChart.  Take care and seek immediate care sooner if you develop any concerns.   Dr. Ky Barban

## 2022-04-14 NOTE — Assessment & Plan Note (Signed)
Doing well, asymptomatic. Counseled on tobacco cessation. UTD with lung cancer screening.

## 2022-04-14 NOTE — Assessment & Plan Note (Signed)
Encouraged daily compliance with statin, zetia.

## 2022-04-14 NOTE — Progress Notes (Signed)
   SUBJECTIVE:   CHIEF COMPLAINT / HPI:   CAD, LVH, HLD: - Medications: zetia, crestor - Compliance: sometimes misses doses, takes about 3x per week. - Denies any SOB, CP, vision changes, LE edema, medication SEs, or symptoms of hypotension  Emphysema/COPD - noted incidientally on imaging - Medications: none - Compliance: n/a - Exacerbations in last 6 months: none - CAT score: 0 - low dose CT for screening 02/20/22, benign appearing nodule, recommended annual screening. - smoking 1ppd  Benign Prostatic Hypertrophy - follows with Urology - Medications: none. Previously on flomax, cialis. - last PSA 1.4 01/02/22. - w/u in 2013 for hematuria. S/p TURP with [facility], due to enlarged friable prostate. - urinating ok - Symptoms:  some nocturia - Denies: incomplete emptying, straining, and weak stream gross hematuria - no personal history of prostate cancer. Dad and uncle with prostate cancer.   Weight loss - few lb weight loss, 152lb today, down from 157lb in March  - some loss of appetite when cooks at home but able to eat more when eating out. - denies early satiety, pain with eating, blood in stool.  - endorses stress from taking care of wife and job (works 6 days per week) - earing a few bites. Stressed from taking care of wife, job.  - eats more when eating out. - no blood in stool, urine.   Fevers: no Decreased appetite: yes Night sweats: no Dysphagia/odynophagia: no Chest pain: no Shortness of breath: no Cough:  yes, smoking cough, unchanged Nausea: no Vomiting: no Abdominal pain: no Blood in stool: no Easy bruising/bleeding: no Jaundice: no Depression: yes, increased stress, see above Previous colonoscopy: 04/2021 with benign polyps, f/u 3 years.   OBJECTIVE:   BP 120/80   Pulse 81   Temp 97.9 F (36.6 C) (Oral)   Resp 16   Ht '5\' 10"'$  (1.778 m)   Wt 152 lb 8 oz (69.2 kg)   SpO2 99%   BMI 21.88 kg/m   Gen: well appearing, in NAD Card: RRR Lungs:  CTAB Ext: WWP, no edema   ASSESSMENT/PLAN:   Coronary artery disease Asymptomatic. Continue risk modification. Continue statin. Counseled on cutting down smoking.  Pulmonary emphysema ([place]) Doing well, asymptomatic. Counseled on tobacco cessation. UTD with lung cancer screening.  BPH (benign prostatic hyperplasia) Doing well off flomax. Continue to follow with Urology.  Chronic kidney disease, stage III (moderate) ([place]) Recheck labs. May consider addition of ARB if BP able to tolerate.  Current smoker Counseled on tobacco cessation.  Elevated PSA Last PSA wnl. Continue to follow with Urology.  Hyperlipidemia Encouraged daily compliance with statin, zetia.  Weight loss Few lb weight loss in a few months. Some loss of appetite but otherwise asymptomatic. UTD with cancer screenings. Does have h/o depression with considerable stress that he attributes to his loss of appetite. Counseled on stress management, f/u in 3 months, recommend repeat weight and PHQ9 at that time. Obtaining labs today.     Myles Gip, DO

## 2022-04-14 NOTE — Assessment & Plan Note (Signed)
Asymptomatic. Continue risk modification. Continue statin. Counseled on cutting down smoking.

## 2022-04-15 LAB — BASIC METABOLIC PANEL
BUN: 17 mg/dL (ref 7–25)
CO2: 28 mmol/L (ref 20–32)
Calcium: 9.1 mg/dL (ref 8.6–10.3)
Chloride: 105 mmol/L (ref 98–110)
Creat: 1.34 mg/dL (ref 0.70–1.35)
Glucose, Bld: 77 mg/dL (ref 65–99)
Potassium: 4.1 mmol/L (ref 3.5–5.3)
Sodium: 140 mmol/L (ref 135–146)

## 2022-04-15 LAB — VITAMIN B12: Vitamin B-12: 259 pg/mL (ref 200–1100)

## 2022-04-16 ENCOUNTER — Ambulatory Visit: Payer: Medicare Other | Admitting: Internal Medicine

## 2022-06-19 ENCOUNTER — Ambulatory Visit (INDEPENDENT_AMBULATORY_CARE_PROVIDER_SITE_OTHER): Payer: Medicare Other

## 2022-06-19 DIAGNOSIS — Z0000 Encounter for general adult medical examination without abnormal findings: Secondary | ICD-10-CM | POA: Diagnosis not present

## 2022-06-19 NOTE — Progress Notes (Signed)
   Patient did not show/answer for appt

## 2022-07-15 ENCOUNTER — Encounter: Payer: Self-pay | Admitting: Family Medicine

## 2022-07-15 ENCOUNTER — Ambulatory Visit (INDEPENDENT_AMBULATORY_CARE_PROVIDER_SITE_OTHER): Payer: Medicare Other | Admitting: Family Medicine

## 2022-07-15 VITALS — BP 130/72 | HR 87 | Temp 97.6°F | Resp 18 | Ht 70.0 in | Wt 157.0 lb

## 2022-07-15 DIAGNOSIS — R591 Generalized enlarged lymph nodes: Secondary | ICD-10-CM

## 2022-07-15 DIAGNOSIS — E538 Deficiency of other specified B group vitamins: Secondary | ICD-10-CM

## 2022-07-15 DIAGNOSIS — G8929 Other chronic pain: Secondary | ICD-10-CM

## 2022-07-15 DIAGNOSIS — N183 Chronic kidney disease, stage 3 unspecified: Secondary | ICD-10-CM

## 2022-07-15 DIAGNOSIS — M542 Cervicalgia: Secondary | ICD-10-CM

## 2022-07-15 DIAGNOSIS — Z23 Encounter for immunization: Secondary | ICD-10-CM | POA: Diagnosis not present

## 2022-07-15 DIAGNOSIS — J449 Chronic obstructive pulmonary disease, unspecified: Secondary | ICD-10-CM

## 2022-07-15 DIAGNOSIS — I700 Atherosclerosis of aorta: Secondary | ICD-10-CM

## 2022-07-15 DIAGNOSIS — K118 Other diseases of salivary glands: Secondary | ICD-10-CM | POA: Diagnosis not present

## 2022-07-15 DIAGNOSIS — M545 Low back pain, unspecified: Secondary | ICD-10-CM

## 2022-07-15 DIAGNOSIS — D573 Sickle-cell trait: Secondary | ICD-10-CM

## 2022-07-15 DIAGNOSIS — E782 Mixed hyperlipidemia: Secondary | ICD-10-CM | POA: Diagnosis not present

## 2022-07-15 MED ORDER — BUDESONIDE-FORMOTEROL FUMARATE 160-4.5 MCG/ACT IN AERO: 2.0000 | INHALATION_SPRAY | Freq: Two times a day (BID) | RESPIRATORY_TRACT | 3 refills | Status: DC

## 2022-07-15 MED ORDER — GUAIFENESIN ER 600 MG PO TB12: 600.0000 mg | ORAL_TABLET | Freq: Two times a day (BID) | ORAL | Status: DC | PRN

## 2022-07-15 NOTE — Progress Notes (Signed)
Name: Manuel [name]   MRN: [id number]    DOB: [birth year]   Date:07/15/2022       Progress Note  Chief Complaint  Patient presents with   Follow-up   Hyperlipidemia   Chronic Kidney Disease     Subjective:   Manuel [name] is a 66 y.o. male, presents to clinic for routine f/up  HLD - lipids done earlier this year Lab Results  Component Value Date   CHOL 165 12/18/2021   HDL 45 12/18/2021   LDLCALC 103 (H) 12/18/2021   TRIG 83 12/18/2021   CHOLHDL 3.7 12/18/2021  On crestor past 1-2 years and zetia for several years  Dentures upper and lower Some rash at corners of mouth   [place] - for CT f/up  - pt notes getting some cream from him for his mouth   Lost weight but trying to maintain and increase calories Wt Readings from Last 15 Encounters:  07/15/22 157 lb (71.2 kg)  04/14/22 152 lb 8 oz (69.2 kg)  01/08/22 157 lb (71.2 kg)  12/17/21 157 lb 6.4 oz (71.4 kg)  11/26/21 157 lb 12.8 oz (71.6 kg)  08/15/21 155 lb (70.3 kg)  07/09/21 156 lb (70.8 kg)  06/29/21 160 lb (72.6 kg)  05/02/21 157 lb (71.2 kg)  04/11/21 165 lb 12.8 oz (75.2 kg)  04/08/21 160 lb (72.6 kg)  04/05/21 160 lb (72.6 kg)  04/03/21 160 lb (72.6 kg)  03/06/21 160 lb (72.6 kg)  01/10/21 169 lb (76.7 kg)   BMI Readings from Last 5 Encounters:  07/15/22 22.53 kg/m  04/14/22 21.88 kg/m  01/08/22 22.53 kg/m  12/17/21 22.58 kg/m  11/26/21 22.64 kg/m   Back and neck pain - was doing pain management/PT previously       Current Outpatient Medications:    Cholecalciferol (VITAMIN D3 PO), Take by mouth., Disp: , Rfl:    diphenhydramine-acetaminophen (TYLENOL PM) 25-500 MG TABS tablet, Take 2 tablets by mouth at bedtime as needed (pain)., Disp: , Rfl:    ezetimibe (ZETIA) 10 MG tablet, Take 10 mg by mouth See admin instructions. 2 times a month, Disp: , Rfl:    rosuvastatin (CRESTOR) 20 MG tablet, Take 1 tablet (20 mg total) by mouth at bedtime., Disp: 90 tablet, Rfl: 3   vitamin B-12  (CYANOCOBALAMIN) 500 MCG tablet, Take 2 tablets (1,000 mcg total) by mouth daily., Disp: 30 tablet, Rfl: 11  Patient Active Problem List   Diagnosis Date Noted   Weight loss 04/14/2022   Caregiver stress 08/15/2021   Hx of colonic polyps    Polyp of sigmoid colon    MDD (major depressive disorder), recurrent, in full remission ([place]) 10/31/2020   Primary insomnia 10/31/2020   Chronic kidney disease, stage III (moderate) ([place]) 11/12/2018   Recurrent depression ([place]) 07/15/2018   Chronic tension-type headache, intractable 05/17/2018   Myofascial pain syndrome 05/17/2018   Chronic daily headache 04/12/2018   Coronary artery disease 01/11/2018   Aortic atherosclerosis ([place]) 01/11/2018   Pulmonary emphysema ([place]) 01/11/2018   Mass of left thigh 12/30/2017   Vitamin D deficiency 01/14/2016   Vitamin B12 deficiency 01/14/2016   Renal lesion 11/13/2015   Chronic lower back pain 11/09/2015   BPH (benign prostatic hyperplasia) 11/01/2015   Elevated PSA 11/01/2015   Hyperlipidemia 11/01/2015   Bunion, left foot 11/01/2015   Sickle cell trait ([place]) 01/16/2015   Cervical spondylosis without myelopathy 01/09/2015   Carpal tunnel syndrome 01/09/2015   Rotator cuff syndrome of left shoulder 09/18/2014  LVH (left ventricular hypertrophy) 08/18/2014   Current smoker 10/03/2013    Past Surgical History:  Procedure Laterality Date   CARPAL TUNNEL RELEASE Left    COLONOSCOPY WITH PROPOFOL N/A 12/25/2015   Procedure: COLONOSCOPY WITH PROPOFOL;  Surgeon: Lucilla Lame, MD;  Location: [facility];  Service: Endoscopy;  Laterality: N/A;   COLONOSCOPY WITH PROPOFOL N/A 05/02/2021   Procedure: COLONOSCOPY WITH PROPOFOL;  Surgeon: Lucilla Lame, MD;  Location: [facility];  Service: Endoscopy;  Laterality: N/A;   CYSTOURETHROSCOPY  08/25/12   with ureteral cathization w/wo retrograde pyelogram   HOLEP-LASER ENUCLEATION OF THE PROSTATE WITH MORCELLATION N/A 04/05/2021   Procedure: HOLEP-LASER  ENUCLEATION OF THE PROSTATE WITH MORCELLATION;  Surgeon: Billey Co, MD;  Location: [facility];  Service: Urology;  Laterality: N/A;   POLYPECTOMY N/A 05/02/2021   Procedure: POLYPECTOMY;  Surgeon: Lucilla Lame, MD;  Location: [facility];  Service: Endoscopy;  Laterality: N/A;   ROTATOR CUFF REPAIR Right 01/25/15   SHOULDER ARTHROSCOPY WITH SUBACROMIAL DECOMPRESSION Right [id number]   TRANSURETHRAL RESECTION OF PROSTATE  08/25/12    Family History  Problem Relation Age of Onset   Aneurysm Mother    Hypertension Mother    Diabetes Father    Hypertension Father    Prostate cancer Father    Lupus Father    Cancer Maternal Aunt    Cancer Maternal Uncle    Diabetes Paternal Uncle    Heart disease Cousin    Prostate cancer Paternal Uncle    COPD Neg Hx    Stroke Neg Hx    Kidney disease Neg Hx     Social History   Tobacco Use   Smoking status: Every Day    Packs/day: 0.50    Years: 35.00    Total pack years: 17.50    Types: Cigarettes   Smokeless tobacco: Never   Tobacco comments:    1-2 PPD for most of his 48 yr hx smoking, > 30 packyear  Vaping Use   Vaping Use: Never used  Substance Use Topics   Alcohol use: No   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     Allergies  Allergen Reactions   Penicillin G Other (See Comments)    Childhood    Health Maintenance  Topic Date Due   COVID-19 Vaccine (3 - Pfizer series) 07/31/2022 (Originally 05/09/2021)   TETANUS/TDAP  11/04/2023   COLONOSCOPY (Pts 45-60yrs Insurance coverage will need to be confirmed)  05/02/2024   INFLUENZA VACCINE  Completed   Hepatitis C Screening  Completed   Zoster Vaccines- Shingrix  Completed   HIV Screening  Addressed   HPV VACCINES  Aged Out    Chart Review Today: I personally reviewed active problem list, medication list, allergies, family history, social history, health maintenance, notes from last encounter, lab results, imaging with the patient/caregiver  today.   Review of Systems  Constitutional: Negative.   HENT: Negative.    Eyes: Negative.   Respiratory: Negative.    Cardiovascular: Negative.   Gastrointestinal: Negative.   Endocrine: Negative.   Genitourinary: Negative.   Musculoskeletal: Negative.   Skin: Negative.   Allergic/Immunologic: Negative.   Neurological: Negative.   Hematological: Negative.   Psychiatric/Behavioral: Negative.    All other systems reviewed and are negative.    Objective:   Vitals:   07/15/22 1006  BP: 130/72  Pulse: 87  Resp: 18  Temp: 97.6 F (36.4 C)  TempSrc: Oral  SpO2: 99%  Weight:  157 lb (71.2 kg)  Height: $Remove'5\' 10"'rEENAnj$  (1.778 m)    Body mass index is 22.53 kg/m.  Physical Exam Vitals and nursing note reviewed.  Constitutional:      General: He is not in acute distress.    Appearance: Normal appearance. He is not ill-appearing, toxic-appearing or diaphoretic.  HENT:     Head: Normocephalic and atraumatic.     Right Ear: External ear normal.     Left Ear: External ear normal.  Eyes:     General:        Right eye: No discharge.        Left eye: No discharge.     Conjunctiva/sclera: Conjunctivae normal.  Cardiovascular:     Rate and Rhythm: Normal rate and regular rhythm.     Pulses: Normal pulses.     Heart sounds: Normal heart sounds.  Pulmonary:     Effort: Pulmonary effort is normal. No respiratory distress.     Breath sounds: Rhonchi present. No wheezing or rales.  Abdominal:     General: Bowel sounds are normal.     Palpations: Abdomen is soft.  Musculoskeletal:     Cervical back: Normal range of motion. No rigidity.     Right lower leg: No edema.     Left lower leg: No edema.  Lymphadenopathy:     Cervical: No cervical adenopathy.  Skin:    Coloration: Skin is not jaundiced or pale.  Neurological:     Mental Status: He is alert. Mental status is at baseline.     Gait: Gait normal.  Psychiatric:        Mood and Affect: Mood normal.        Behavior: Behavior  normal.         Assessment & Plan:     ICD-10-CM   1. Mixed hyperlipidemia  Y70.6 COMPLETE METABOLIC PANEL WITH GFR    Lipid panel   due for labs, on statin, monitoring    2. Stage 3 chronic kidney disease, unspecified whether stage 3a or 3b CKD (HCC)  C37.62 COMPLETE METABOLIC PANEL WITH GFR   recheck labs, pt was est with nephro     3. Need for influenza vaccination  Z23 Flu Vaccine QUAD 64mo+IM (Fluarix, Fluzone & Alfiuria Quad PF)    4. Need for shingles vaccine  Z23 Varicella-zoster vaccine IM    5. Submandibular gland mass  K11.8 CBC with Differential/Platelet    CT Soft Tissue Neck W Contrast   see below    6. Lymphadenopathy of head and neck  R59.1 CBC with Differential/Platelet    COMPLETE METABOLIC PANEL WITH GFR    CT Soft Tissue Neck W Contrast   Pt consulted ENT, was lost to f/up after being instructed to f/up in 3 more weeks with Dr. Richardson Landry    7. Vitamin B12 deficiency  E53.8 CBC with Differential/Platelet    Vitamin B12   recheck    8. Chronic obstructive pulmonary disease, unspecified COPD type (HCC)  J44.9 guaiFENesin (MUCINEX) 600 MG 12 hr tablet    budesonide-formoterol (SYMBICORT) 160-4.5 MCG/ACT inhaler   current smoker, doing lung CA screening, refill inhalers, consider pulm consult    9. Neck pain  M54.2 Ambulatory referral to Physical Medicine Rehab    CANCELED: Ambulatory referral to Orthopedics   chronic, cervicalgia, multiple specialists and scans in chart - f/up [facility], previously consulted neurosurgery and ortho    10. Chronic low back pain, unspecified back pain laterality, unspecified whether sciatica present  M54.50 Ambulatory referral to Physical Medicine Rehab   G89.29 CANCELED: Ambulatory referral to Orthopedics    11. Aortic atherosclerosis (HCC) Chronic I70.0    on statin, monitoring    12. Sickle cell trait (HCC) Chronic D57.3    monitoring          PT has hx of chronic neck and back patin that was  previously managed at [facility] and also by physical and Occupational Therapy for several years-from what I can see in the chart visits 2015 When I first met the patient he explained he had been doing physical therapy and continued management of his pain up until he moved to this area His previously referred to spine surgery/neurosurgery, physical therapy and Ortho many times visible in the chart with Dr. Mickel Baas and myself, 2017, 2018 2019, 2020, 2021 Pt continues to work full-time and to have chronic pain particular to his neck and low back -there are a lot of x-rays and MRIs visible in care everywhere and in this EMR as well -last office visits that I can see her with neurosurgery at [facility] with Dr. Lacinda Axon and Marin Olp PA in 2021     Return for 3 weeks get appt for additional acute problems and do labs at that time.   Delsa Grana, PA-C 07/15/22 10:22 AM

## 2022-07-17 ENCOUNTER — Encounter: Payer: Self-pay | Admitting: Family Medicine

## 2022-07-18 NOTE — Progress Notes (Signed)
Pt has been notified and verbalized understanding. Called [facility] Leonel Ramsay to cancel CT Neck, Janett Billow cancelled it.

## 2022-07-22 ENCOUNTER — Ambulatory Visit: Payer: Medicare Other

## 2022-08-05 ENCOUNTER — Encounter: Payer: Self-pay | Admitting: Family Medicine

## 2022-08-05 ENCOUNTER — Ambulatory Visit (INDEPENDENT_AMBULATORY_CARE_PROVIDER_SITE_OTHER): Payer: Medicare Other | Admitting: Family Medicine

## 2022-08-05 VITALS — BP 136/78 | HR 74 | Temp 97.6°F | Resp 16 | Ht 70.0 in | Wt 159.5 lb

## 2022-08-05 DIAGNOSIS — E538 Deficiency of other specified B group vitamins: Secondary | ICD-10-CM

## 2022-08-05 DIAGNOSIS — N183 Chronic kidney disease, stage 3 unspecified: Secondary | ICD-10-CM

## 2022-08-05 DIAGNOSIS — R591 Generalized enlarged lymph nodes: Secondary | ICD-10-CM

## 2022-08-05 DIAGNOSIS — J449 Chronic obstructive pulmonary disease, unspecified: Secondary | ICD-10-CM | POA: Diagnosis not present

## 2022-08-05 DIAGNOSIS — M545 Low back pain, unspecified: Secondary | ICD-10-CM

## 2022-08-05 DIAGNOSIS — E782 Mixed hyperlipidemia: Secondary | ICD-10-CM

## 2022-08-05 DIAGNOSIS — G8929 Other chronic pain: Secondary | ICD-10-CM

## 2022-08-05 DIAGNOSIS — K118 Other diseases of salivary glands: Secondary | ICD-10-CM

## 2022-08-05 NOTE — Patient Instructions (Addendum)
Team Member Role and Specialty Contact Info Address  PCPs     Laurell Roof General (Family Medicine) Phone: [phone] Fax: [phone]  [address] [place] [address]  Additional Team Members     Reche Dixon, [place] (Orthopedic Surgery) Phone: [phone] Fax: [phone]  [address] [place] [address]  Vladimir Crofts, MD Consulting Physician (Neurology) Phone: [phone] Fax: [phone]  [address] [facility] [place] [address]  Lucilla Lame, MD Consulting Physician (Gastroenterology) Phone: [phone] Fax: [phone]  [address] [place] [address]  Laneta Simmers Physician Assistant (Urology) Phone: [phone] Fax: [phone]  [address] [place] [address]  Clyde Canterbury, MD Referring Physician (Otolaryngology) ENT Phone: [phone] Fax: [phone]  [address] [place] [address]    08/19/2022 Initial consult Physical Medicine and Rehabilitation/physiatry  Sabas Sous, [place]   [place], [place] [address]   [phone] (Work)   [phone] (Fax)     [place], [place], [place] [address]   [place], [place] [address]   [phone] (Work)   [phone] (Fax)

## 2022-08-05 NOTE — Progress Notes (Signed)
Name: Manuel [name]   MRN: [id number]    DOB: Jan 01, 1956   Date:08/05/2022       Progress Note  Chief Complaint  Patient presents with   Follow-up   Hyperlipidemia    Also wants to discuss COVID immunizations     Subjective:   Manuel [name] is a 66 y.o. male, presents to clinic for additional acute CC after last OV - today he does not have any concerns or complaints other than what was previously addressed  F/up on several sx, dx, and acute issues over the last year Referrals were made for chronic pain issues - he has appt with [facility]  He continues to have some tender lymph nodes by jaw/neck and mouth and corners of mouth - he was lost to f/up after Dr. Richardson Landry treated him - info given for him to get f/up with ENT/Dr. Richardson Landry  HLD - meds reviewed crestor and zetia- labs due Lab Results  Component Value Date   CHOL 165 12/18/2021   HDL 45 12/18/2021   LDLCALC 103 (H) 12/18/2021   TRIG 83 12/18/2021   CHOLHDL 3.7 12/18/2021   Recheck on COPD - was more symptomatic with wet cough, rhonchi to lungs We changed inhaler and he used mucinex He does feel much better with symbicort BID and then prn and mucinex Less coughing, less wheeze/cough      Current Outpatient Medications:    budesonide-formoterol (SYMBICORT) 160-4.5 MCG/ACT inhaler, Inhale 2 puffs into the lungs 2 (two) times daily. And can use as rescue/PRN 2 puffs up to 4 times a day for wheeze or cough, max 12 puffs in 24 hours, Disp: 1 each, Rfl: 3   Cholecalciferol (VITAMIN D3 PO), Take by mouth., Disp: , Rfl:    diphenhydramine-acetaminophen (TYLENOL PM) 25-500 MG TABS tablet, Take 2 tablets by mouth at bedtime as needed (pain)., Disp: , Rfl:    ezetimibe (ZETIA) 10 MG tablet, Take 10 mg by mouth See admin instructions. 2 times a month, Disp: , Rfl:    guaiFENesin (MUCINEX) 600 MG 12 hr tablet, Take 1 tablet (600 mg total) by mouth 2 (two) times daily as needed., Disp: , Rfl:    rosuvastatin (CRESTOR)  20 MG tablet, Take 1 tablet (20 mg total) by mouth at bedtime., Disp: 90 tablet, Rfl: 3   vitamin B-12 (CYANOCOBALAMIN) 500 MCG tablet, Take 2 tablets (1,000 mcg total) by mouth daily., Disp: 30 tablet, Rfl: 11  Patient Active Problem List   Diagnosis Date Noted   Weight loss 04/14/2022   Caregiver stress 08/15/2021   Hx of colonic polyps    Polyp of sigmoid colon    MDD (major depressive disorder), recurrent, in full remission ([place]) 10/31/2020   Primary insomnia 10/31/2020   Chronic kidney disease, stage III (moderate) ([place]) 11/12/2018   Recurrent depression ([place]) 07/15/2018   Chronic tension-type headache, intractable 05/17/2018   Myofascial pain syndrome 05/17/2018   Chronic daily headache 04/12/2018   Coronary artery disease 01/11/2018   Aortic atherosclerosis ([place]) 01/11/2018   Pulmonary emphysema ([place]) 01/11/2018   Mass of left thigh 12/30/2017   Vitamin D deficiency 01/14/2016   Vitamin B12 deficiency 01/14/2016   Renal lesion 11/13/2015   Chronic lower back pain 11/09/2015   BPH (benign prostatic hyperplasia) 11/01/2015   Elevated PSA 11/01/2015   Hyperlipidemia 11/01/2015   Bunion, left foot 11/01/2015   Sickle cell trait ([place]) 01/16/2015   Cervical spondylosis without myelopathy 01/09/2015   Carpal tunnel syndrome 01/09/2015   Rotator  cuff syndrome of left shoulder 09/18/2014   LVH (left ventricular hypertrophy) 08/18/2014   Current smoker 10/03/2013    Past Surgical History:  Procedure Laterality Date   CARPAL TUNNEL RELEASE Left    COLONOSCOPY WITH PROPOFOL N/A 12/25/2015   Procedure: COLONOSCOPY WITH PROPOFOL;  Surgeon: Lucilla Lame, MD;  Location: [facility];  Service: Endoscopy;  Laterality: N/A;   COLONOSCOPY WITH PROPOFOL N/A 05/02/2021   Procedure: COLONOSCOPY WITH PROPOFOL;  Surgeon: Lucilla Lame, MD;  Location: [place];  Service: Endoscopy;  Laterality: N/A;   CYSTOURETHROSCOPY  08/25/12   with ureteral cathization w/wo retrograde pyelogram    HOLEP-LASER ENUCLEATION OF THE PROSTATE WITH MORCELLATION N/A 04/05/2021   Procedure: HOLEP-LASER ENUCLEATION OF THE PROSTATE WITH MORCELLATION;  Surgeon: Billey Co, MD;  Location: [facility];  Service: Urology;  Laterality: N/A;   POLYPECTOMY N/A 05/02/2021   Procedure: POLYPECTOMY;  Surgeon: Lucilla Lame, MD;  Location: [place];  Service: Endoscopy;  Laterality: N/A;   ROTATOR CUFF REPAIR Right 01/25/15   SHOULDER ARTHROSCOPY WITH SUBACROMIAL DECOMPRESSION Right 032416   TRANSURETHRAL RESECTION OF PROSTATE  08/25/12    Family History  Problem Relation Age of Onset   Aneurysm Mother    Hypertension Mother    Diabetes Father    Hypertension Father    Prostate cancer Father    Lupus Father    Cancer Maternal Aunt    Cancer Maternal Uncle    Diabetes Paternal Uncle    Heart disease Cousin    Prostate cancer Paternal Uncle    COPD Neg Hx    Stroke Neg Hx    Kidney disease Neg Hx     Social History   Tobacco Use   Smoking status: Every Day    Packs/day: 0.50    Years: 35.00    Total pack years: 17.50    Types: Cigarettes   Smokeless tobacco: Never   Tobacco comments:    1-2 PPD for most of his 82 yr hx smoking, > 30 packyear  Vaping Use   Vaping Use: Never used  Substance Use Topics   Alcohol use: No   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     Allergies  Allergen Reactions   Penicillin G Other (See Comments)    Childhood    Health Maintenance  Topic Date Due   COVID-19 Vaccine (3 - Pfizer series) 08/21/2022 (Originally 05/09/2021)   TETANUS/TDAP  11/04/2023   COLONOSCOPY (Pts 45-86yr Insurance coverage will need to be confirmed)  05/02/2024   INFLUENZA VACCINE  Completed   Hepatitis C Screening  Completed   Zoster Vaccines- Shingrix  Completed   HIV Screening  Addressed   HPV VACCINES  Aged Out    Chart Review Today: I personally reviewed active problem list, medication list, allergies, family history, social history, health  maintenance, notes from last encounter, lab results, imaging with the patient/caregiver today.   Review of Systems  Constitutional: Negative.   HENT: Negative.    Eyes: Negative.   Respiratory: Negative.    Cardiovascular: Negative.   Gastrointestinal: Negative.   Endocrine: Negative.   Genitourinary: Negative.   Musculoskeletal: Negative.   Skin: Negative.   Allergic/Immunologic: Negative.   Neurological: Negative.   Hematological: Negative.   Psychiatric/Behavioral: Negative.    All other systems reviewed and are negative.    Objective:   Vitals:   08/05/22 0847  BP: 136/78  Pulse: 74  Resp: 16  Temp: 97.6 F (36.4 C)  TempSrc: Oral  SpO2: 100%  Weight: 159 lb 8 oz (72.3 kg)  Height: '5\' 10"'$  (1.778 m)    Body mass index is 22.89 kg/m.  Physical Exam Vitals and nursing note reviewed.  Constitutional:      General: He is not in acute distress.    Appearance: Normal appearance. He is well-developed. He is not ill-appearing or diaphoretic.  HENT:     Head: Normocephalic and atraumatic.     Right Ear: External ear normal.     Left Ear: External ear normal.     Nose: Nose normal. No congestion.     Mouth/Throat:     Mouth: Mucous membranes are moist.     Pharynx: Oropharynx is clear. Posterior oropharyngeal erythema present.  Eyes:     General: No scleral icterus.       Right eye: No discharge.        Left eye: No discharge.     Conjunctiva/sclera: Conjunctivae normal.  Neck:     Trachea: No tracheal deviation.  Cardiovascular:     Rate and Rhythm: Normal rate and regular rhythm.     Pulses: Normal pulses.     Heart sounds: Normal heart sounds.  Pulmonary:     Effort: Pulmonary effort is normal. No respiratory distress.     Breath sounds: Normal breath sounds. No stridor. No wheezing, rhonchi or rales.  Lymphadenopathy:     Head:     Right side of head: No submental, submandibular, tonsillar, preauricular or posterior auricular adenopathy.     Left side  of head: Submandibular adenopathy present. No submental, tonsillar, preauricular or posterior auricular adenopathy.     Cervical: Cervical adenopathy present.     Right cervical: Superficial cervical adenopathy present. No deep or posterior cervical adenopathy.    Left cervical: No superficial, deep or posterior cervical adenopathy.     Comments: One small superficial right cervical lymph node pt notes is tender Left submandibular lymph node enlarged and mildly tender   Skin:    General: Skin is warm and dry.     Findings: No rash.  Neurological:     Mental Status: He is alert.     Motor: No abnormal muscle tone.     Coordination: Coordination normal.     Gait: Gait normal.  Psychiatric:        Mood and Affect: Mood normal.        Behavior: Behavior normal.         Assessment & Plan:   1. Chronic obstructive pulmonary disease, unspecified COPD type (HCC) Breath sounds and pulm sx improved with symbicort and prn use of mucinex - continue the same - still may need to f/up with pulm  2. Lymphadenopathy of head and neck Some small areas of tenderness but no abnormally large lymph nodes or masses palpated, pt did not f/up with ENT, but he was given ENT info today and he will call and make the f/up appt  3. Submandibular gland mass Same as above - prior visit ENT records and plan reviewed  4. Stage 3 chronic kidney disease, unspecified whether stage 3a or 3b CKD ([place]) Lab ordered at prior appt, completed today, bp acceptable, he does see nephrology or has in the past, he may need to be put back on renal protective meds - will determine pending lab results  5. Mixed hyperlipidemia On crestor 20 and zetia - see last note Labs done today  6. Vitamin B12 deficiency See last OV, on supplement, was  due for recheck, still feeling fatigued  7. Chronic low back pain, unspecified back pain laterality, unspecified whether sciatica present He has appt with [facility]iatry at [facility] He has  consulted [facility]- likely to have care well coordinated at their practice    Return in about 6 months (around 02/04/2023) for Routine follow-up.   Delsa Grana, PA-C 08/05/22 9:16 AM

## 2022-08-06 LAB — CBC WITH DIFFERENTIAL/PLATELET
Absolute Monocytes: 420 cells/uL (ref 200–950)
Basophils Absolute: 40 cells/uL (ref 0–200)
Basophils Relative: 0.8 %
Eosinophils Absolute: 110 cells/uL (ref 15–500)
Eosinophils Relative: 2.2 %
HCT: 41.5 % (ref 38.5–50.0)
Hemoglobin: 13.5 g/dL (ref 13.2–17.1)
Lymphs Abs: 1445 cells/uL (ref 850–3900)
MCH: 25.2 pg — ABNORMAL LOW (ref 27.0–33.0)
MCHC: 32.5 g/dL (ref 32.0–36.0)
MCV: 77.4 fL — ABNORMAL LOW (ref 80.0–100.0)
MPV: 9.6 fL (ref 7.5–12.5)
Monocytes Relative: 8.4 %
Neutro Abs: 2985 cells/uL (ref 1500–7800)
Neutrophils Relative %: 59.7 %
Platelets: 247 10*3/uL (ref 140–400)
RBC: 5.36 10*6/uL (ref 4.20–5.80)
RDW: 15.1 % — ABNORMAL HIGH (ref 11.0–15.0)
Total Lymphocyte: 28.9 %
WBC: 5 10*3/uL (ref 3.8–10.8)

## 2022-08-06 LAB — COMPLETE METABOLIC PANEL WITH GFR
AG Ratio: 1.4 (calc) (ref 1.0–2.5)
ALT: 9 U/L (ref 9–46)
AST: 18 U/L (ref 10–35)
Albumin: 4 g/dL (ref 3.6–5.1)
Alkaline phosphatase (APISO): 62 U/L (ref 35–144)
BUN: 14 mg/dL (ref 7–25)
CO2: 30 mmol/L (ref 20–32)
Calcium: 9.2 mg/dL (ref 8.6–10.3)
Chloride: 102 mmol/L (ref 98–110)
Creat: 1.35 mg/dL (ref 0.70–1.35)
Globulin: 2.8 g/dL (calc) (ref 1.9–3.7)
Glucose, Bld: 82 mg/dL (ref 65–139)
Potassium: 4 mmol/L (ref 3.5–5.3)
Sodium: 141 mmol/L (ref 135–146)
Total Bilirubin: 0.4 mg/dL (ref 0.2–1.2)
Total Protein: 6.8 g/dL (ref 6.1–8.1)
eGFR: 59 mL/min/{1.73_m2} — ABNORMAL LOW (ref >=60)

## 2022-08-06 LAB — LIPID PANEL
Cholesterol: 162 mg/dL (ref <200)
HDL: 53 mg/dL (ref >=40)
LDL Cholesterol (Calc): 89 mg/dL (calc)
Non-HDL Cholesterol (Calc): 109 mg/dL (calc) (ref <130)
Total CHOL/HDL Ratio: 3.1 (calc) (ref <5.0)
Triglycerides: 104 mg/dL (ref <150)

## 2022-08-06 LAB — VITAMIN B12: Vitamin B-12: 452 pg/mL (ref 200–1100)

## 2022-08-20 ENCOUNTER — Telehealth: Payer: Self-pay | Admitting: *Deleted

## 2022-08-20 NOTE — Telephone Encounter (Signed)
Patient called for lab results and notified:  Labs show cholesterol is better - keep taking meds daily  B12 was at a normal level - he can choose to do one supplement over another if he generally feels better with one vs another  No anemia Kidney function was good - improved a little compared to prior labs - still CKD stage 2-3a, with GFR of 59, mild kidney disease - he can keep following up with the kidney specialists - looks like they have labs and future f/up appts    Keep the 6 month f/up appt with [place] and at that time we can review their labs  No med changes

## 2022-10-14 ENCOUNTER — Other Ambulatory Visit: Payer: Self-pay | Admitting: Family Medicine

## 2022-10-14 DIAGNOSIS — F17200 Nicotine dependence, unspecified, uncomplicated: Secondary | ICD-10-CM

## 2022-10-14 DIAGNOSIS — F17210 Nicotine dependence, cigarettes, uncomplicated: Secondary | ICD-10-CM

## 2022-10-14 DIAGNOSIS — J449 Chronic obstructive pulmonary disease, unspecified: Secondary | ICD-10-CM

## 2022-10-14 NOTE — Progress Notes (Signed)
Interested in testing for COPD, sig smoking hx, here with wife  - order put in    ICD-10-CM   1. Chronic obstructive pulmonary disease, unspecified COPD type ([place])  J44.9 Ambulatory referral to Pulmonology    2. Current smoker  F17.200 Ambulatory referral to Pulmonology   smoking 2 ppd since teens and then in past 2 years only 1 ppd, wants smoking cessation and COPD testing    3. Smokes with greater than 30 pack year history  F17.210 Ambulatory referral to Pulmonology     Delsa Grana, PA-C 10/14/22 10:55 AM

## 2022-10-29 ENCOUNTER — Other Ambulatory Visit: Payer: Self-pay | Admitting: Family Medicine

## 2022-10-29 DIAGNOSIS — M5412 Radiculopathy, cervical region: Secondary | ICD-10-CM

## 2022-11-11 ENCOUNTER — Ambulatory Visit: Payer: 59 | Attending: Family Medicine | Admitting: Physical Therapy

## 2022-11-11 DIAGNOSIS — M542 Cervicalgia: Secondary | ICD-10-CM | POA: Diagnosis not present

## 2022-11-11 NOTE — Therapy (Signed)
OUTPATIENT PHYSICAL THERAPY CERVICAL EVALUATION   Patient Name: Manuel [name] MRN: [id number] DOB:[birth year], 67 y.o., male Today's Date: 11/11/2022  END OF SESSION:  PT End of Session - 11/11/22 1119     Visit Number 1 (P)     Number of Visits 17 (P)     Date for PT Re-Evaluation 01/09/23 (P)     Authorization - Visit Number 1 (P)     Authorization - Number of Visits 17 (P)     Progress Note Due on Visit 10 (P)     PT Start Time 1125 (P)     PT Stop Time 1210 (P)     PT Time Calculation (min) 45 min (P)     Activity Tolerance Patient tolerated treatment well (P)     Behavior During Therapy [facility] for tasks assessed/performed (P)              Past Medical History:  Diagnosis Date   Anemia    Anxiety    Aortic atherosclerosis ([place]) 01/11/2018   CT scan Feb 2019   Asthma    Bilateral carpal tunnel syndrome    Bilateral hand numbness    BPH without urinary obstruction    Cervical spondylosis without myelopathy    Chronic kidney disease    STAGE 3   Complete tear of rotator cuff    bilateral   COPD (chronic obstructive pulmonary disease) ([place])    Depression    Emphysema lung ([place]) 01/11/2018   Chest CT Feb 2019   History of stomach ulcers    HLD (hyperlipidemia)    LVH (left ventricular hypertrophy)    Personal history of tobacco use, presenting hazards to health 12/21/2015   Poor dentition    Sickle cell trait ([place])    Sleep apnea    Smoker    Past Surgical History:  Procedure Laterality Date   CARPAL TUNNEL RELEASE Left    COLONOSCOPY WITH PROPOFOL N/A 12/25/2015   Procedure: COLONOSCOPY WITH PROPOFOL;  Surgeon: Lucilla Lame, MD;  Location: [facility];  Service: Endoscopy;  Laterality: N/A;   COLONOSCOPY WITH PROPOFOL N/A 05/02/2021   Procedure: COLONOSCOPY WITH PROPOFOL;  Surgeon: Lucilla Lame, MD;  Location: [place];  Service: Endoscopy;  Laterality: N/A;   CYSTOURETHROSCOPY  08/25/12   with ureteral cathization w/wo retrograde pyelogram    HOLEP-LASER ENUCLEATION OF THE PROSTATE WITH MORCELLATION N/A 04/05/2021   Procedure: HOLEP-LASER ENUCLEATION OF THE PROSTATE WITH MORCELLATION;  Surgeon: Billey Co, MD;  Location: [facility];  Service: Urology;  Laterality: N/A;   POLYPECTOMY N/A 05/02/2021   Procedure: POLYPECTOMY;  Surgeon: Lucilla Lame, MD;  Location: [place];  Service: Endoscopy;  Laterality: N/A;   ROTATOR CUFF REPAIR Right 01/25/15   SHOULDER ARTHROSCOPY WITH SUBACROMIAL DECOMPRESSION Right 032416   TRANSURETHRAL RESECTION OF PROSTATE  08/25/12   Patient Active Problem List   Diagnosis Date Noted   Weight loss 04/14/2022   Caregiver stress 08/15/2021   Hx of colonic polyps    Polyp of sigmoid colon    MDD (major depressive disorder), recurrent, in full remission ([place]) 10/31/2020   Primary insomnia 10/31/2020   Chronic kidney disease, stage III (moderate) ([place]) 11/12/2018   Recurrent depression ([place]) 07/15/2018   Chronic tension-type headache, intractable 05/17/2018   Myofascial pain syndrome 05/17/2018   Chronic daily headache 04/12/2018   Coronary artery disease 01/11/2018   Aortic atherosclerosis ([place]) 01/11/2018   Pulmonary emphysema ([place]) 01/11/2018   Mass of left thigh 12/30/2017   Vitamin  D deficiency 01/14/2016   Vitamin B12 deficiency 01/14/2016   Renal lesion 11/13/2015   Chronic lower back pain 11/09/2015   BPH (benign prostatic hyperplasia) 11/01/2015   Elevated PSA 11/01/2015   Hyperlipidemia 11/01/2015   Bunion, left foot 11/01/2015   Sickle cell trait ([place]) 01/16/2015   Cervical spondylosis without myelopathy 01/09/2015   Carpal tunnel syndrome 01/09/2015   Rotator cuff syndrome of left shoulder 09/18/2014   LVH (left ventricular hypertrophy) 08/18/2014   Current smoker 10/03/2013    PCP: Delsa Grana PA  REFERRING PROVIDER: Allene Dillon FNP  REFERRING DIAG: cervicalgia  THERAPY DIAG:  Cervicalgia  Rationale for Evaluation and Treatment: Rehabilitation  ONSET  DATE: 2014  SUBJECTIVE:                                                                                                                                                                                                         SUBJECTIVE STATEMENT: Chronic neck pain   PERTINENT HISTORY:  Pt is a 67 year old male presenting with chronic neck pain since 2014. Patient reports pain worsening over this year due to stress with his wife having dementia and losing his brother. Neck pain is R side of neck along levator with patient reporting some global neck pain as well. Current pain 5/10; worst 10/10. Pain is aggravated by sitting still with driving and laying down to go to sleep, rotating head and looking up, lifting, carrying, and lifting overhead. Has consistent n/t in L hand that he has had since "hand surgery 2 years ago". 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 Pt is the caregiver for his wife with dementia, who does not require physical assistance, but is a heavy emotional/mental burden. Endorses low back pain concordant with neck pain that is also a concern. No falls in past 6 months. Pt denies N/V, B&B changes, unexplained weight fluctuation, saddle paresthesia, fever, night sweats, or unrelenting night pain at this time.   PAIN:  Are you having pain? Yes: NPRS scale: 5/10 Pain location: R side of neck along levator scapulae Pain description: nagging, sharp  Aggravating factors: sitting still with driving and laying down to go to sleep, rotating head and looking up. lifting, carrying, and lifting overhead.  Relieving factors: pain medication   PRECAUTIONS: None  WEIGHT BEARING RESTRICTIONS: No  FALLS:  Has patient fallen in last 6 months? No  LIVING ENVIRONMENT: Lives with: lives with their spouse Lives in: House/apartment Stairs: Yes: External: 3 steps; bilateral but cannot reach both Has following equipment at home: Single point cane  OCCUPATION: handyman  PLOF:  Independent  PATIENT GOALS: decrease neck pain   NEXT MD VISIT: "in a few weeks"  OBJECTIVE:   DIAGNOSTIC FINDINGS:  Pt reported: 3 years ago MRI said I have decreased disc space in my neck   PATIENT SURVEYS:  FOTO 49 goal of 80  COGNITION: Overall cognitive status: Within functional limits for tasks assessed  SENSATION: WFL  POSTURE: rounded shoulders, forward head, and decreased thoracic kyphosis  PALPATION: Concordant pain to R levator; secondary R UT with tension and trigger points throughout  Global tension and pain to gross cervical musculature; pain to all CPA and UPAs C2 - CTJ noted guarding with hypomobility of tspine  Observation: scapular dyskinesis with overhead (R) mobility with excessive upward rotation UT involvement in raise of LUE   CERVICAL ROM:   Active ROM A/PROM (deg) eval  Flexion WNL   Extension WNL with concordant pain   Right lateral flexion WNL with "stretch sensation"  Left lateral flexion WNL with concordant pain to R side of the neck  Right rotation 38d with concordant pain   Left rotation 64d   (Blank rows = not tested)  PROM WNL all directions with concordant R sided neck pain with L rotation and L lateral flexion (stretch sensation)  UPPER EXTREMITY ROM:  Active ROM Right eval Left eval  Shoulder flexion WNL 64  Shoulder abduction WNL 86  Shoulder adduction WNL   Shoulder extension WNL WNL  Shoulder internal rotation WNL Apleys L5/S1  Shoulder external rotation WNL unable  Elbow flexion WNL WNL  Elbow extension WNL WNL with pain  Wrist flexion    Wrist extension    Wrist ulnar deviation    Wrist radial deviation    Wrist pronation    Wrist supination     (Blank rows = not tested)  UPPER EXTREMITY MMT:  MMT Right eval Left eval  Shoulder flexion 5 4+ (within available range)  Shoulder extension 5 5  Shoulder abduction 5 3+ (within available range)  Shoulder adduction    Shoulder extension 5 5 (within available  range  Shoulder internal rotation 4+ 3  Shoulder external rotation 4 2+  Middle trapezius 3+ 3  Lower trapezius 3- 2+  Elbow flexion    Elbow extension    Wrist flexion    Wrist extension    Wrist ulnar deviation    Wrist radial deviation    Wrist pronation    Wrist supination    Grip strength     (Blank rows = not tested)  CERVICAL SPECIAL TESTS:  Neck flexor muscle endurance test: 31sec with stretch pain all the way to my low back , Upper limb tension test (ULTT): Negative, Spurling's test: Negative, and Distraction test: Negative    TODAY'S TREATMENT:                                                                                                                              DATE:  11/11/21   PT reviewed the following HEP with patient with patient able to demonstrate a set of the following with min cuing for correction needed. PT educated patient on parameters of therex (how/when to inc/decrease intensity, frequency, rep/set range, stretch hold time, and purpose of therex) with verbalized understanding.  Access Code: [email or web address] URL: [email or web address] Date: 11/11/2022 Prepared by: Durwin Reges  Exercises - Seated Cervical Sidebending Stretch  - 1-3 x daily - 7 x weekly - 30-60sec hold - Seated Levator Scapulae Stretch  - 1-3 x daily - 7 x weekly - 30-60sec hold - Seated Scapular Retraction  - 8 x daily - 7 x weekly - 12-20 reps - 2sec hold   PATIENT EDUCATION:  Education details: Patient was educated on diagnosis, anatomy and pathology involved, prognosis, role of PT, and was given an HEP, demonstrating exercise with proper form following verbal and tactile cues, and was given a paper hand out to continue exercise at home. Pt was educated on and agreed to plan of care.  Person educated: Patient Education method: Explanation, Demonstration, and Handouts Education comprehension: verbalized understanding, returned demonstration, and verbal cues required  HOME  EXERCISE PROGRAM: Access Code: [email or web address] URL: [email or web address] Date: 11/11/2022 Prepared by: Durwin Reges  Exercises - Seated Cervical Sidebending Stretch  - 1-3 x daily - 7 x weekly - 30-60sec hold - Seated Levator Scapulae Stretch  - 1-3 x daily - 7 x weekly - 30-60sec hold - Seated Scapular Retraction  - 8 x daily - 7 x weekly - 12-20 reps - 2sec hold  ASSESSMENT:  CLINICAL IMPRESSION: Patient is a 67 y.o. male who was seen today for physical therapy evaluation and treatment for cervical pain. Impairments in decreased active cervical mobility (esp R rotation), decreased L RTC and bilat periscapular strength, decreased activity tolerance, decreased DNF endurance, decreased L shoulder mobility, and pain. Activity limitations in sleeping, overhead lifting/reaching, turning to look up or behind him, carrying, pushing, and pulling; inhibiting full participation in household and community Dls. Would benefit from skilled PT to address above deficits and promote optimal return to PLOF.    OBJECTIVE IMPAIRMENTS: decreased activity tolerance, decreased coordination, decreased endurance, decreased mobility, difficulty walking, decreased ROM, decreased strength, hypomobility, increased fascial restrictions, increased muscle spasms, impaired flexibility, impaired UE functional use, improper body mechanics, postural dysfunction, and pain.   ACTIVITY LIMITATIONS: carrying, lifting, sleeping, transfers, bathing, dressing, locomotion level, and caring for others  PARTICIPATION LIMITATIONS: meal prep, cleaning, laundry, driving, shopping, community activity, and occupation  PERSONAL FACTORS: Age, Fitness, Past/current experiences, Time since onset of injury/illness/exacerbation, and 1-2 comorbidities: HLD, chronic LBP, cervical spondylosis, emphysema  are also affecting patient's functional outcome.   REHAB POTENTIAL: Good  CLINICAL DECISION MAKING: Evolving/moderate  complexity  EVALUATION COMPLEXITY: Moderate   GOALS: Goals reviewed with patient? Yes  SHORT TERM GOALS: Target date: 12/03/22  Pt will be independent with HEP in order to improve strength and balance in order to decrease fall risk and improve function at home and work. Baseline: HEP given  Goal status: INITIAL   LONG TERM GOALS: Target date: 01/09/23  Patient will increase FOTO score to 57 to demonstrate predicted increase in functional mobility to complete ADLs Baseline: 11/11/22 49 Goal status: INITIAL  2.  Pt will demonstrate R cervical rotation to at least 60d in order to be able to drive safely  Baseline: 38d Goal status: INITIAL  3.  Pt will demonstrate gross periscapular strength of 4+/5 in order to complete heavy household ADLs Baseline: R/L Mid trap (  T) 3+/3; lower trp (Y) 3-/2_+  Goal status: INITIAL  4.  Pt will decrease worst pain as reported on NPRS by at least 3 points in order to demonstrate clinically significant reduction in pain.  Baseline: 10/10 Goal status: INITIAL     PLAN:  PT FREQUENCY: 1-2x/week  PT DURATION: 8 weeks  PLANNED INTERVENTIONS: Therapeutic exercises, Therapeutic activity, Neuromuscular re-education, Balance training, Gait training, Patient/Family education, Self Care, Joint mobilization, Joint manipulation, Dry Needling, Electrical stimulation, Spinal manipulation, Spinal mobilization, Cryotherapy, Moist heat, Traction, Ultrasound, Manual therapy, and Re-evaluation  PLAN FOR NEXT SESSION: HEP review  Durwin Reges DPT Durwin Reges, PT 11/11/2022, 12:33 PM

## 2022-11-17 ENCOUNTER — Ambulatory Visit: Payer: 59 | Admitting: Physical Therapy

## 2022-11-18 ENCOUNTER — Ambulatory Visit: Payer: 59 | Admitting: Physical Therapy

## 2022-11-18 ENCOUNTER — Encounter: Payer: Self-pay | Admitting: Physical Therapy

## 2022-11-18 DIAGNOSIS — M542 Cervicalgia: Secondary | ICD-10-CM | POA: Diagnosis not present

## 2022-11-18 NOTE — Therapy (Signed)
OUTPATIENT PHYSICAL THERAPY CERVICAL EVALUATION   Patient Name: Manuel [name] MRN: [id number] DOB:1955-12-10, 67 y.o., male Today's Date: 11/18/2022  END OF SESSION:  PT End of Session - 11/18/22 0835     Visit Number 2    Number of Visits 17    Date for PT Re-Evaluation 01/16/23    Authorization - Visit Number 1    Authorization - Number of Visits 17    Progress Note Due on Visit 10    PT Start Time 0833    PT Stop Time 0911    PT Time Calculation (min) 38 min    Activity Tolerance Patient tolerated treatment well    Behavior During Therapy [facility] for tasks assessed/performed              Past Medical History:  Diagnosis Date   Anemia    Anxiety    Aortic atherosclerosis ([place]) 01/11/2018   CT scan Feb 2019   Asthma    Bilateral carpal tunnel syndrome    Bilateral hand numbness    BPH without urinary obstruction    Cervical spondylosis without myelopathy    Chronic kidney disease    STAGE 3   Complete tear of rotator cuff    bilateral   COPD (chronic obstructive pulmonary disease) ([place])    Depression    Emphysema lung ([place]) 01/11/2018   Chest CT Feb 2019   History of stomach ulcers    HLD (hyperlipidemia)    LVH (left ventricular hypertrophy)    Personal history of tobacco use, presenting hazards to health 12/21/2015   Poor dentition    Sickle cell trait ([place])    Sleep apnea    Smoker    Past Surgical History:  Procedure Laterality Date   CARPAL TUNNEL RELEASE Left    COLONOSCOPY WITH PROPOFOL N/A 12/25/2015   Procedure: COLONOSCOPY WITH PROPOFOL;  Surgeon: Lucilla Lame, MD;  Location: [facility];  Service: Endoscopy;  Laterality: N/A;   COLONOSCOPY WITH PROPOFOL N/A 05/02/2021   Procedure: COLONOSCOPY WITH PROPOFOL;  Surgeon: Lucilla Lame, MD;  Location: [facility];  Service: Endoscopy;  Laterality: N/A;   CYSTOURETHROSCOPY  08/25/12   with ureteral cathization w/wo retrograde pyelogram   HOLEP-LASER ENUCLEATION OF THE PROSTATE WITH  MORCELLATION N/A 04/05/2021   Procedure: HOLEP-LASER ENUCLEATION OF THE PROSTATE WITH MORCELLATION;  Surgeon: Billey Co, MD;  Location: [facility];  Service: Urology;  Laterality: N/A;   POLYPECTOMY N/A 05/02/2021   Procedure: POLYPECTOMY;  Surgeon: Lucilla Lame, MD;  Location: [place];  Service: Endoscopy;  Laterality: N/A;   ROTATOR CUFF REPAIR Right 01/25/15   SHOULDER ARTHROSCOPY WITH SUBACROMIAL DECOMPRESSION Right [id number]   TRANSURETHRAL RESECTION OF PROSTATE  08/25/12   Patient Active Problem List   Diagnosis Date Noted   Weight loss 04/14/2022   Caregiver stress 08/15/2021   Hx of colonic polyps    Polyp of sigmoid colon    MDD (major depressive disorder), recurrent, in full remission ([place]) 10/31/2020   Primary insomnia 10/31/2020   Chronic kidney disease, stage III (moderate) ([place]) 11/12/2018   Recurrent depression ([place]) 07/15/2018   Chronic tension-type headache, intractable 05/17/2018   Myofascial pain syndrome 05/17/2018   Chronic daily headache 04/12/2018   Coronary artery disease 01/11/2018   Aortic atherosclerosis ([place]) 01/11/2018   Pulmonary emphysema ([place]) 01/11/2018   Mass of left thigh 12/30/2017   Vitamin D deficiency 01/14/2016   Vitamin B12 deficiency 01/14/2016   Renal lesion 11/13/2015   Chronic lower back pain 11/09/2015  BPH (benign prostatic hyperplasia) 11/01/2015   Elevated PSA 11/01/2015   Hyperlipidemia 11/01/2015   Bunion, left foot 11/01/2015   Sickle cell trait ([place]) 01/16/2015   Cervical spondylosis without myelopathy 01/09/2015   Carpal tunnel syndrome 01/09/2015   Rotator cuff syndrome of left shoulder 09/18/2014   LVH (left ventricular hypertrophy) 08/18/2014   Current smoker 10/03/2013    PCP: Delsa Grana PA  REFERRING PROVIDER: Allene Dillon FNP  REFERRING DIAG: cervicalgia  THERAPY DIAG:  Cervicalgia  Rationale for Evaluation and Treatment: Rehabilitation  ONSET DATE: 2014  SUBJECTIVE:                                                                                                                                                                                                          SUBJECTIVE STATEMENT: Reports he is still having 8/10 pain at R side of the neck. Pt reports stretching 2x/day but that he gets interrupted helping his wife  PERTINENT HISTORY:  Pt is a 67 year old male presenting with chronic neck pain since 2014. Patient reports pain worsening over this year due to stress with his wife having dementia and losing his brother. Neck pain is R side of neck along levator with patient reporting some global neck pain as well. Current pain 5/10; worst 10/10. Pain is aggravated by sitting still with driving and laying down to go to sleep, rotating head and looking up, lifting, carrying, and lifting overhead. Has consistent n/t in L hand that he has had since "hand surgery 2 years ago". 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 Pt is the caregiver for his wife with dementia, who does not require physical assistance, but is a heavy emotional/mental burden. Endorses low back pain concordant with neck pain that is also a concern. No falls in past 6 months. Pt denies N/V, B&B changes, unexplained weight fluctuation, saddle paresthesia, fever, night sweats, or unrelenting night pain at this time.   PAIN:  Are you having pain? Yes: NPRS scale: 5/10 Pain location: R side of neck along levator scapulae Pain description: nagging, sharp  Aggravating factors: sitting still with driving and laying down to go to sleep, rotating head and looking up. lifting, carrying, and lifting overhead.  Relieving factors: pain medication   PRECAUTIONS: None  WEIGHT BEARING RESTRICTIONS: No  FALLS:  Has patient fallen in last 6 months? No  LIVING ENVIRONMENT: Lives with: lives with their spouse Lives in: House/apartment Stairs: Yes: External: 3 steps; bilateral but cannot reach both Has following equipment  at home: Single point cane  OCCUPATION: handyman  PLOF: Independent  PATIENT GOALS: decrease neck pain   NEXT MD VISIT: "in a few weeks"  OBJECTIVE:   DIAGNOSTIC FINDINGS:  Pt reported: 3 years ago MRI said I have decreased disc space in my neck   PATIENT SURVEYS:  FOTO 49 goal of 16  COGNITION: Overall cognitive status: Within functional limits for tasks assessed  SENSATION: WFL  POSTURE: rounded shoulders, forward head, and decreased thoracic kyphosis  PALPATION: Concordant pain to R levator; secondary R UT with tension and trigger points throughout  Global tension and pain to gross cervical musculature; pain to all CPA and UPAs C2 - CTJ noted guarding with hypomobility of tspine  Observation: scapular dyskinesis with overhead (R) mobility with excessive upward rotation UT involvement in raise of LUE   CERVICAL ROM:   Active ROM A/PROM (deg) eval  Flexion WNL   Extension WNL with concordant pain   Right lateral flexion WNL with "stretch sensation"  Left lateral flexion WNL with concordant pain to R side of the neck  Right rotation 38d with concordant pain   Left rotation 64d   (Blank rows = not tested)  PROM WNL all directions with concordant R sided neck pain with L rotation and L lateral flexion (stretch sensation)  UPPER EXTREMITY ROM:  Active ROM Right eval Left eval  Shoulder flexion WNL 64  Shoulder abduction WNL 86  Shoulder adduction WNL   Shoulder extension WNL WNL  Shoulder internal rotation WNL Apleys L5/S1  Shoulder external rotation WNL unable  Elbow flexion WNL WNL  Elbow extension WNL WNL with pain  Wrist flexion    Wrist extension    Wrist ulnar deviation    Wrist radial deviation    Wrist pronation    Wrist supination     (Blank rows = not tested)  UPPER EXTREMITY MMT:  MMT Right eval Left eval  Shoulder flexion 5 4+ (within available range)  Shoulder extension 5 5  Shoulder abduction 5 3+ (within available range)   Shoulder adduction    Shoulder extension 5 5 (within available range  Shoulder internal rotation 4+ 3  Shoulder external rotation 4 2+  Middle trapezius 3+ 3  Lower trapezius 3- 2+  Elbow flexion    Elbow extension    Wrist flexion    Wrist extension    Wrist ulnar deviation    Wrist radial deviation    Wrist pronation    Wrist supination    Grip strength     (Blank rows = not tested)  CERVICAL SPECIAL TESTS:  Neck flexor muscle endurance test: 31sec with stretch pain all the way to my low back , Upper limb tension test (ULTT): Negative, Spurling's test: Negative, and Distraction test: Negative    TODAY'S TREATMENT:                                                                                                                              DATE: 11/11/21  Nustep seat 10 UE 14 L3 52mns for gently motion and upper back strengthening Supine chin tuck 5secH x10 with patient reporting suboccipital stretch Supine alt cervical rotation with maintained chin tuck x12 Seated cervical circles x12 each direction  Alt seated cervical rotations with maintained chin tuck x20 Post shoulder rolls x15 Attempted STS with dowel overhead AAROM to LUE but patient reports too painful; STS with thoracic ext + cervical ext at stand x12 Thoracic rotation stretch x30sec bilat UT stretch x30sec bilat Levator scapulae stretch 30sec bilat  Manual Distraction 10sec on/off x10 PROM all direction STM with trigger point release to R UT and levator    PATIENT EDUCATION:  Education details: Patient was educated on diagnosis, anatomy and pathology involved, prognosis, role of PT, and was given an HEP, demonstrating exercise with proper form following verbal and tactile cues, and was given a paper hand out to continue exercise at home. Pt was educated on and agreed to plan of care.  Person educated: Patient Education method: Explanation, Demonstration, and Handouts Education comprehension: verbalized  understanding, returned demonstration, and verbal cues required  HOME EXERCISE PROGRAM: Access Code: [email or web address]: [email or web address] Date: 11/11/2022 Prepared by: CDurwin Reges Exercises - Seated Cervical Sidebending Stretch  - 1-3 x daily - 7 x weekly - 30-60sec hold - Seated Levator Scapulae Stretch  - 1-3 x daily - 7 x weekly - 30-60sec hold - Seated Scapular Retraction  - 8 x daily - 7 x weekly - 12-20 reps - 2sec hold  ASSESSMENT:  CLINICAL IMPRESSION: PT initiated therex progression for increased thoracic and cervical mobility with success. Patient is able to comply with all cuing for proper technique of therex with no pain reported at the end of session. Patient is able to demonstrate near full cervical mobility at end of session. PT will continue progression as able.     OBJECTIVE IMPAIRMENTS: decreased activity tolerance, decreased coordination, decreased endurance, decreased mobility, difficulty walking, decreased ROM, decreased strength, hypomobility, increased fascial restrictions, increased muscle spasms, impaired flexibility, impaired UE functional use, improper body mechanics, postural dysfunction, and pain.   ACTIVITY LIMITATIONS: carrying, lifting, sleeping, transfers, bathing, dressing, locomotion level, and caring for others  PARTICIPATION LIMITATIONS: meal prep, cleaning, laundry, driving, shopping, community activity, and occupation  PERSONAL FACTORS: Age, Fitness, Past/current experiences, Time since onset of injury/illness/exacerbation, and 1-2 comorbidities: HLD, chronic LBP, cervical spondylosis, emphysema  are also affecting patient's functional outcome.   REHAB POTENTIAL: Good  CLINICAL DECISION MAKING: Evolving/moderate complexity  EVALUATION COMPLEXITY: Moderate   GOALS: Goals reviewed with patient? Yes  SHORT TERM GOALS: Target date: 12/03/22  Pt will be independent with HEP in order to improve strength and balance in order to  decrease fall risk and improve function at home and work. Baseline: HEP given  Goal status: INITIAL   LONG TERM GOALS: Target date: 01/09/23  Patient will increase FOTO score to 57 to demonstrate predicted increase in functional mobility to complete ADLs Baseline: 11/11/22 49 Goal status: INITIAL  2.  Pt will demonstrate R cervical rotation to at least 60d in order to be able to drive safely  Baseline: 38d Goal status: INITIAL  3.  Pt will demonstrate gross periscapular strength of 4+/5 in order to complete heavy household ADLs Baseline: R/L Mid trap (T) 3+/3; lower trp (Y) 3-/2_+  Goal status: INITIAL  4.  Pt will decrease worst pain as reported on NPRS by at least 3 points in order to demonstrate clinically significant reduction in pain.  Baseline: 10/10 Goal  status: INITIAL     PLAN:  PT FREQUENCY: 1-2x/week  PT DURATION: 8 weeks  PLANNED INTERVENTIONS: Therapeutic exercises, Therapeutic activity, Neuromuscular re-education, Balance training, Gait training, Patient/Family education, Self Care, Joint mobilization, Joint manipulation, Dry Needling, Electrical stimulation, Spinal manipulation, Spinal mobilization, Cryotherapy, Moist heat, Traction, Ultrasound, Manual therapy, and Re-evaluation  PLAN FOR NEXT SESSION: HEP review  Durwin Reges DPT Durwin Reges, PT 11/18/2022, 9:11 AM

## 2022-11-20 ENCOUNTER — Ambulatory Visit: Payer: 59 | Admitting: Physical Therapy

## 2022-11-21 ENCOUNTER — Encounter: Payer: Self-pay | Admitting: Student in an Organized Health Care Education/Training Program

## 2022-11-21 ENCOUNTER — Ambulatory Visit (INDEPENDENT_AMBULATORY_CARE_PROVIDER_SITE_OTHER): Payer: 59 | Admitting: Student in an Organized Health Care Education/Training Program

## 2022-11-21 VITALS — BP 126/74 | HR 63 | Temp 98.1°F | Ht 70.0 in | Wt 169.0 lb

## 2022-11-21 DIAGNOSIS — F17210 Nicotine dependence, cigarettes, uncomplicated: Secondary | ICD-10-CM | POA: Diagnosis not present

## 2022-11-21 DIAGNOSIS — M25512 Pain in left shoulder: Secondary | ICD-10-CM | POA: Diagnosis not present

## 2022-11-21 DIAGNOSIS — R0602 Shortness of breath: Secondary | ICD-10-CM

## 2022-11-21 DIAGNOSIS — M19012 Primary osteoarthritis, left shoulder: Secondary | ICD-10-CM | POA: Diagnosis not present

## 2022-11-21 MED ORDER — NICOTINE POLACRILEX 2 MG MT LOZG: 2.0000 mg | LOZENGE | OROMUCOSAL | 3 refills | Status: AC | PRN

## 2022-11-21 MED ORDER — NICOTINE 7 MG/24HR TD PT24: 7.0000 mg | MEDICATED_PATCH | TRANSDERMAL | 0 refills | Status: AC

## 2022-11-21 MED ORDER — NICOTINE 14 MG/24HR TD PT24: 14.0000 mg | MEDICATED_PATCH | TRANSDERMAL | 0 refills | Status: AC

## 2022-11-21 MED ORDER — NICOTINE 21 MG/24HR TD PT24: 21.0000 mg | MEDICATED_PATCH | TRANSDERMAL | 0 refills | Status: AC

## 2022-11-21 NOTE — Progress Notes (Signed)
Synopsis: Referred in for shortness of breath by Delsa Grana, PA-C  Assessment & Plan:   #Shortness of breath  Presents with the chief complaint of exertional dyspnea and a cough in the setting of smoking. I will obtain a pulmonary function test (spirometry, lung volumes, DLCO) to assess for any signs of COPD. I did review his low dose chest CT and it is notable for significant paraseptal emphysema secondary to his smoking history.  - Pulmonary Function Test [facility] Only; Future  #Moderate cigarette smoker (10-19 per day)  Reports interest in smoking cessation. Will initiate nicotine replacement therapy with patches and lozenges.  - nicotine (NICODERM CQ - DOSED IN MG/24 HOURS) 14 mg/24hr patch; Place 1 patch (14 mg total) onto the skin daily for 14 days.  Dispense: 14 patch; Refill: 0 - nicotine (NICODERM CQ - DOSED IN MG/24 HOURS) 21 mg/24hr patch; Place 1 patch (21 mg total) onto the skin daily.  Dispense: 42 patch; Refill: 0 - nicotine (NICODERM CQ - DOSED IN MG/24 HR) 7 mg/24hr patch; Place 1 patch (7 mg total) onto the skin daily for 14 days.  Dispense: 14 patch; Refill: 0 - nicotine polacrilex (NICOTINE MINI) 2 MG lozenge; Take 1 lozenge (2 mg total) by mouth every 2 (two) hours as needed for smoking cessation.  Dispense: 72 lozenge; Refill: 3 - Ambulatory referral to Smoking Cessation Program - Pulmonary Function Test [facility] Only; Future   Return in about 8 weeks (around 01/16/2023).  I spent 60 minutes caring for this patient today, including preparing to see the patient, obtaining a medical history , reviewing a separately obtained history, performing a medically appropriate examination and/or evaluation, counseling and educating the patient/family/caregiver, ordering medications, tests, or procedures, documenting clinical information in the electronic health record, and independently interpreting results (not separately reported/billed) and communicating results to the  patient/family/caregiver  Armando Reichert, MD [facility] 11/21/2022 10:49 AM    End of visit medications:  Meds ordered this encounter  Medications   nicotine (NICODERM CQ - DOSED IN MG/24 HOURS) 14 mg/24hr patch    Sig: Place 1 patch (14 mg total) onto the skin daily for 14 days.    Dispense:  14 patch    Refill:  0   nicotine (NICODERM CQ - DOSED IN MG/24 HOURS) 21 mg/24hr patch    Sig: Place 1 patch (21 mg total) onto the skin daily.    Dispense:  42 patch    Refill:  0   nicotine (NICODERM CQ - DOSED IN MG/24 HR) 7 mg/24hr patch    Sig: Place 1 patch (7 mg total) onto the skin daily for 14 days.    Dispense:  14 patch    Refill:  0   nicotine polacrilex (NICOTINE MINI) 2 MG lozenge    Sig: Take 1 lozenge (2 mg total) by mouth every 2 (two) hours as needed for smoking cessation.    Dispense:  72 lozenge    Refill:  3     Current Outpatient Medications:    budesonide-formoterol (SYMBICORT) 160-4.5 MCG/ACT inhaler, Inhale 2 puffs into the lungs 2 (two) times daily. And can use as rescue/PRN 2 puffs up to 4 times a day for wheeze or cough, max 12 puffs in 24 hours, Disp: 1 each, Rfl: 3   Cholecalciferol (VITAMIN D3 PO), Take by mouth., Disp: , Rfl:    diphenhydramine-acetaminophen (TYLENOL PM) 25-500 MG TABS tablet, Take 2 tablets by mouth at bedtime as needed (pain)., Disp: , Rfl:  nicotine (NICODERM CQ - DOSED IN MG/24 HOURS) 14 mg/24hr patch, Place 1 patch (14 mg total) onto the skin daily for 14 days., Disp: 14 patch, Rfl: 0   nicotine (NICODERM CQ - DOSED IN MG/24 HOURS) 21 mg/24hr patch, Place 1 patch (21 mg total) onto the skin daily., Disp: 42 patch, Rfl: 0   nicotine (NICODERM CQ - DOSED IN MG/24 HR) 7 mg/24hr patch, Place 1 patch (7 mg total) onto the skin daily for 14 days., Disp: 14 patch, Rfl: 0   nicotine polacrilex (NICOTINE MINI) 2 MG lozenge, Take 1 lozenge (2 mg total) by mouth every 2 (two) hours as needed for smoking cessation., Disp: 72  lozenge, Rfl: 3   rosuvastatin (CRESTOR) 20 MG tablet, Take 1 tablet (20 mg total) by mouth at bedtime., Disp: 90 tablet, Rfl: 3   vitamin B-12 (CYANOCOBALAMIN) 500 MCG tablet, Take 2 tablets (1,000 mcg total) by mouth daily., Disp: 30 tablet, Rfl: 11   ezetimibe (ZETIA) 10 MG tablet, Take 10 mg by mouth See admin instructions. 2 times a month (Patient not taking: Reported on 11/21/2022), Disp: , Rfl:    guaiFENesin (MUCINEX) 600 MG 12 hr tablet, Take 1 tablet (600 mg total) by mouth 2 (two) times daily as needed. (Patient not taking: Reported on 11/21/2022), Disp: , Rfl:    Subjective:   PATIENT ID: Manuel [name] GENDER: male DOB: 1956/01/22, MRN: [id number]  Chief Complaint  Patient presents with   pulmonary consult    Hx of COPD--SOB with exertion, prod cough with green sputum and wheezing.    HPI  Manuel [name] is a pleasant 67 year old male with a history of smoking and family history of COPD who is presenting to clinic for an evaluation.  His main symptoms is that of mild shortness of breath.  Patient is essentially in his usual state of health and is able to exercise and do all activities of daily living. He gets short of breath with extreme exertion. He has a cough that is occasional, more so in the morning, sometimes productive. He has no fevers, chills, night sweats, weight loss, GI, or GU symptoms.  He has a longstanding history of smoking (1 pack a day from the age of 45) and had a brother who died of COPD.  He is concerned that he could have COPD and would like to be evaluated.  He is also inquiring about smoking cessation. He presents accompanied by his wife. 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 No occupational exposures reported.  Ancillary information including prior medications, full medical/surgical/family/social histories, and PFTs (when available) are listed below and have been reviewed.   Review of Systems   Constitutional:  Negative for chills, fever, malaise/fatigue and weight loss.  Respiratory:  Positive for cough and shortness of breath. Negative for hemoptysis, sputum production and wheezing.   Cardiovascular:  Negative for chest pain, palpitations, orthopnea, leg swelling and PND.  Skin:  Negative for rash.  Neurological:  Negative for dizziness.  Psychiatric/Behavioral:  Negative for depression.      Objective:   Vitals:   11/21/22 1010  BP: 126/74  Pulse: 63  Temp: 98.1 F (36.7 C)  TempSrc: Temporal  SpO2: 99%  Weight: 169 lb (76.7 kg)  Height: '5\' 10"'$  (1.778 m)   99% on RA  BMI Readings from Last 3 Encounters:  11/21/22 24.25 kg/m  08/05/22 22.89 kg/m  07/15/22 22.53 kg/m  Wt Readings from Last 3 Encounters:  11/21/22 169 lb (76.7 kg)  08/05/22 159 lb 8 oz (72.3 kg)  07/15/22 157 lb (71.2 kg)    Physical Exam Constitutional:      General: He is not in acute distress.    Appearance: Normal appearance. He is not ill-appearing.  HENT:     Head: Normocephalic.     Nose: Nose normal.     Mouth/Throat:     Mouth: Mucous membranes are moist.  Eyes:     Extraocular Movements: Extraocular movements intact.  Cardiovascular:     Rate and Rhythm: Normal rate and regular rhythm.     Pulses: Normal pulses.     Heart sounds: Normal heart sounds.  Pulmonary:     Effort: Pulmonary effort is normal. No respiratory distress.     Breath sounds: Normal breath sounds. No wheezing or rales.  Abdominal:     Palpations: Abdomen is soft.  Musculoskeletal:     Right lower leg: No edema.     Left lower leg: No edema.  Neurological:     General: No focal deficit present.     Mental Status: He is alert and oriented to person, place, and time. Mental status is at baseline.     Ancillary Information    Past Medical History:  Diagnosis Date   Anemia    Anxiety    Aortic atherosclerosis ([place]) 01/11/2018   CT scan Feb 2019   Asthma    Bilateral carpal tunnel syndrome     Bilateral hand numbness    BPH without urinary obstruction    Cervical spondylosis without myelopathy    Chronic kidney disease    STAGE 3   Complete tear of rotator cuff    bilateral   COPD (chronic obstructive pulmonary disease) ([place])    Depression    Emphysema lung ([place]) 01/11/2018   Chest CT Feb 2019   History of stomach ulcers    HLD (hyperlipidemia)    LVH (left ventricular hypertrophy)    Personal history of tobacco use, presenting hazards to health 12/21/2015   Poor dentition    Sickle cell trait ([place])    Sleep apnea    Smoker      Family History  Problem Relation Age of Onset   Aneurysm Mother    Hypertension Mother    Diabetes Father    Hypertension Father    Prostate cancer Father    Lupus Father    Cancer Maternal Aunt    Cancer Maternal Uncle    Diabetes Paternal Uncle    Heart disease Cousin    Prostate cancer Paternal Uncle    COPD Neg Hx    Stroke Neg Hx    Kidney disease Neg Hx      Past Surgical History:  Procedure Laterality Date   CARPAL TUNNEL RELEASE Left    COLONOSCOPY WITH PROPOFOL N/A 12/25/2015   Procedure: COLONOSCOPY WITH PROPOFOL;  Surgeon: Lucilla Lame, MD;  Location: [facility];  Service: Endoscopy;  Laterality: N/A;   COLONOSCOPY WITH PROPOFOL N/A 05/02/2021   Procedure: COLONOSCOPY WITH PROPOFOL;  Surgeon: Lucilla Lame, MD;  Location: [place];  Service: Endoscopy;  Laterality: N/A;   CYSTOURETHROSCOPY  08/25/12   with ureteral cathization w/wo retrograde pyelogram   HOLEP-LASER ENUCLEATION OF THE PROSTATE WITH MORCELLATION N/A 04/05/2021   Procedure: HOLEP-LASER ENUCLEATION OF THE PROSTATE WITH MORCELLATION;  Surgeon: Billey Co, MD;  Location: [facility];  Service: Urology;  Laterality: N/A;   POLYPECTOMY N/A  05/02/2021   Procedure: POLYPECTOMY;  Surgeon: Lucilla Lame, MD;  Location: [place];  Service: Endoscopy;  Laterality: N/A;   ROTATOR CUFF REPAIR Right 01/25/15   SHOULDER ARTHROSCOPY WITH  SUBACROMIAL DECOMPRESSION Right [id number]   TRANSURETHRAL RESECTION OF PROSTATE  08/25/12    Social History   Socioeconomic History   Marital status: Married    Spouse name: Not on file   Number of children: Not on file   Years of education: Not on file   Highest education level: Not on file  Occupational History   Not on file  Tobacco Use   Smoking status: Every Day    Packs/day: 1.00    Years: 35.00    Total pack years: 35.00    Types: Cigarettes   Smokeless tobacco: Never  Vaping Use   Vaping Use: Never used  Substance and Sexual Activity   Alcohol use: No   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   Sexual activity: Yes  Other Topics Concern   Not on file  Social History Narrative   Not on file   Social Determinants of Health   Financial Resource Strain: Medium Risk (06/18/2021)   Overall Financial Resource Strain (CARDIA)    Difficulty of Paying Living Expenses: Somewhat hard  Food Insecurity: No Food Insecurity (06/18/2021)   Hunger Vital Sign    Worried About Running Out of Food in the Last Year: Never true    Ran Out of Food in the Last Year: Never true  Transportation Needs: No Transportation Needs (06/18/2021)   PRAPARE - Hydrologist (Medical): No    Lack of Transportation (Non-Medical): No  Physical Activity: Inactive (06/18/2021)   Exercise Vital Sign    Days of Exercise per Week: 0 days    Minutes of Exercise per Session: 0 min  Stress: No Stress Concern Present (06/18/2021)   [place]    Feeling of Stress : Only a little  Social Connections: Moderately Isolated (06/18/2021)   Social Connection and Isolation Panel [NHANES]    Frequency of Communication with Friends and Family: More than three times a week    Frequency of Social Gatherings with Friends and Family: Three times a week    Attends Religious Services: Never    Active Member of Clubs or  Organizations: No    Attends Archivist Meetings: Never    Marital Status: Married  Human resources officer Violence: Not At Risk (06/18/2021)   Humiliation, Afraid, Rape, and Kick questionnaire    Fear of Current or Ex-Partner: No    Emotionally Abused: No    Physically Abused: No    Sexually Abused: No     Allergies  Allergen Reactions   Penicillin G Other (See Comments)    Childhood     CBC    Component Value Date/Time   WBC 5.0 08/05/2022 0944   RBC 5.36 08/05/2022 0944   HGB 13.5 08/05/2022 0944   HGB 13.1 01/10/2016 1613   HCT 41.5 08/05/2022 0944   HCT 38.7 01/10/2016 1613   PLT 247 08/05/2022 0944   PLT 230 01/10/2016 1613   MCV 77.4 (L) 08/05/2022 0944   MCV 74 (L) 01/10/2016 1613   MCH 25.2 (L) 08/05/2022 0944   MCHC 32.5 08/05/2022 0944   RDW 15.1 (H) 08/05/2022 0944   RDW 15.1 01/10/2016 1613   LYMPHSABS 1,445 08/05/2022 0944   LYMPHSABS 2.5 01/10/2016 1613  MONOABS 464 10/31/2016 1442   EOSABS 110 08/05/2022 0944   EOSABS 0.2 01/10/2016 1613   BASOSABS 40 08/05/2022 0944   BASOSABS 0.0 01/10/2016 1613    Pulmonary Functions Testing Results:     No data to display          Outpatient Medications Prior to Visit  Medication Sig Dispense Refill   budesonide-formoterol (SYMBICORT) 160-4.5 MCG/ACT inhaler Inhale 2 puffs into the lungs 2 (two) times daily. And can use as rescue/PRN 2 puffs up to 4 times a day for wheeze or cough, max 12 puffs in 24 hours 1 each 3   Cholecalciferol (VITAMIN D3 PO) Take by mouth.     diphenhydramine-acetaminophen (TYLENOL PM) 25-500 MG TABS tablet Take 2 tablets by mouth at bedtime as needed (pain).     rosuvastatin (CRESTOR) 20 MG tablet Take 1 tablet (20 mg total) by mouth at bedtime. 90 tablet 3   vitamin B-12 (CYANOCOBALAMIN) 500 MCG tablet Take 2 tablets (1,000 mcg total) by mouth daily. 30 tablet 11   ezetimibe (ZETIA) 10 MG tablet Take 10 mg by mouth See admin instructions. 2 times a month (Patient not taking:  Reported on 11/21/2022)     guaiFENesin (MUCINEX) 600 MG 12 hr tablet Take 1 tablet (600 mg total) by mouth 2 (two) times daily as needed. (Patient not taking: Reported on 11/21/2022)     No facility-administered medications prior to visit.

## 2022-11-21 NOTE — Patient Instructions (Signed)
The [facility]uit[facility]: Call 1-800-QUIT-NOW (1-800-784-8669). The [facility]uit[facility] is a free service for [place] residents. Trained counselors are available from 8 am until 3 am, 365 days per year. Services are available in both English and Spanish.   Web Resources Free online support programs can help you track your progress and share experiences with others who are quitting. These are examples: www.becomeanex.org www.trytostop.org  www.smokefree.gov  www.everydayhealth.com/smoking-cessation/index.aspx  [facility]: offers comprehensive in-person tobacco treatment counseling at [facility] ([address] [place] [address]).  Open to everyone. Virtual appointments available. Free parking. Call [phone] to schedule an appointment or [phone] for general information.    Tobacco Cessation Medications  Nicotine Replacement Therapy (NRT)  Nicotine is the addictive part of tobacco smoke, but not the most dangerous part. There are 7000 other toxins in cigarettes, including carbon monoxide, that cause disease. People do not generally become addicted to medication. Common problems: People don't use enough medication or stop too early. Medications are safe and effective. Overdose is very uncommon. Use medications as long as needed (3 months minimum). Some combinations work better than single medications. Long acting medications like the NRT patch and bupropion provide continuous treatment for withdrawal symptoms.  PLUS  Short acting medications like the NRT gum, lozenge, inhaler, and nasal spray help people to cope with breakthrough cravings.  ? Nicotine Patch  Place patch on hairless skin on upper body, including arms and back. Each day: discard old patch, shower, apply new patch to a different site. Apply hydrocortisone cream to mildly red/irritated areas. Call provider if rash develops. If patch causes sleep disturbance, remove patch  at bedtime and replace each morning after shower. Side effects may include: skin irritation, headache, insomnia, abnormal/vivid dreams.   ? Nicotine Lozenge  Allow to dissolve slowly in mouth (20-30 minutes). Do not chew or swallow. Nicotine release may cause a warm tingling sensation. Occasionally rotate to different areas of the mouth. Use enough to control cravings, up to 20 lozenges per day (if used alone). Avoid eating or drinking for 15 minutes before using and during use. Side effects may include: nausea, hiccups, cough, heartburn, headache, gas, insomnia. 

## 2022-11-25 ENCOUNTER — Ambulatory Visit: Payer: 59 | Admitting: Physical Therapy

## 2022-11-27 ENCOUNTER — Encounter: Payer: Medicare Other | Admitting: Physical Therapy

## 2022-12-02 ENCOUNTER — Encounter: Payer: Medicare Other | Admitting: Physical Therapy

## 2022-12-03 ENCOUNTER — Ambulatory Visit: Payer: 59 | Admitting: Physical Therapy

## 2022-12-04 ENCOUNTER — Other Ambulatory Visit (HOSPITAL_COMMUNITY): Payer: Self-pay

## 2022-12-04 ENCOUNTER — Encounter: Payer: Medicare Other | Admitting: Physical Therapy

## 2022-12-08 ENCOUNTER — Ambulatory Visit: Payer: Medicare Other | Admitting: Physical Therapy

## 2022-12-08 ENCOUNTER — Ambulatory Visit: Payer: 59 | Attending: Family Medicine | Admitting: Physical Therapy

## 2022-12-08 ENCOUNTER — Encounter: Payer: Self-pay | Admitting: Physical Therapy

## 2022-12-08 DIAGNOSIS — M542 Cervicalgia: Secondary | ICD-10-CM | POA: Insufficient documentation

## 2022-12-08 NOTE — Therapy (Signed)
OUTPATIENT PHYSICAL THERAPY CERVICAL EVALUATION   Patient Name: Manuel [name] MRN: [id number] DOB:12/26/1955, 67 y.o., male Today's Date: 12/08/2022  END OF SESSION:  PT End of Session - 12/08/22 1010     Visit Number 3    Number of Visits 17    Date for PT Re-Evaluation 01/16/23    Authorization - Visit Number 3    Authorization - Number of Visits 17    Progress Note Due on Visit 10    PT Start Time 1011   pt arrived late   PT Stop Time 1040    PT Time Calculation (min) 29 min    Activity Tolerance Patient tolerated treatment well    Behavior During Therapy [facility] for tasks assessed/performed              Past Medical History:  Diagnosis Date   Anemia    Anxiety    Aortic atherosclerosis ([place]) 01/11/2018   CT scan Feb 2019   Asthma    Bilateral carpal tunnel syndrome    Bilateral hand numbness    BPH without urinary obstruction    Cervical spondylosis without myelopathy    Chronic kidney disease    STAGE 3   Complete tear of rotator cuff    bilateral   COPD (chronic obstructive pulmonary disease) ([place])    Depression    Emphysema lung ([place]) 01/11/2018   Chest CT Feb 2019   History of stomach ulcers    HLD (hyperlipidemia)    LVH (left ventricular hypertrophy)    Personal history of tobacco use, presenting hazards to health 12/21/2015   Poor dentition    Sickle cell trait ([place])    Sleep apnea    Smoker    Past Surgical History:  Procedure Laterality Date   CARPAL TUNNEL RELEASE Left    COLONOSCOPY WITH PROPOFOL N/A 12/25/2015   Procedure: COLONOSCOPY WITH PROPOFOL;  Surgeon: Lucilla Lame, MD;  Location: [facility];  Service: Endoscopy;  Laterality: N/A;   COLONOSCOPY WITH PROPOFOL N/A 05/02/2021   Procedure: COLONOSCOPY WITH PROPOFOL;  Surgeon: Lucilla Lame, MD;  Location: [place];  Service: Endoscopy;  Laterality: N/A;   CYSTOURETHROSCOPY  08/25/12   with ureteral cathization w/wo retrograde pyelogram   HOLEP-LASER ENUCLEATION OF THE PROSTATE  WITH MORCELLATION N/A 04/05/2021   Procedure: HOLEP-LASER ENUCLEATION OF THE PROSTATE WITH MORCELLATION;  Surgeon: Billey Co, MD;  Location: [facility] OR[facility];  Service: Urology;  Laterality: N/A;   POLYPECTOMY N/A 05/02/2021   Procedure: POLYPECTOMY;  Surgeon: Lucilla Lame, MD;  Location: [place];  Service: Endoscopy;  Laterality: N/A;   ROTATOR CUFF REPAIR Right 01/25/15   SHOULDER ARTHROSCOPY WITH SUBACROMIAL DECOMPRESSION Right [id number]   TRANSURETHRAL RESECTION OF PROSTATE  08/25/12   Patient Active Problem List   Diagnosis Date Noted   Weight loss 04/14/2022   Caregiver stress 08/15/2021   Hx of colonic polyps    Polyp of sigmoid colon    MDD (major depressive disorder), recurrent, in full remission ([place]) 10/31/2020   Primary insomnia 10/31/2020   Chronic kidney disease, stage III (moderate) ([place]) 11/12/2018   Recurrent depression ([place]) 07/15/2018   Chronic tension-type headache, intractable 05/17/2018   Myofascial pain syndrome 05/17/2018   Chronic daily headache 04/12/2018   Coronary artery disease 01/11/2018   Aortic atherosclerosis ([place]) 01/11/2018   Pulmonary emphysema ([place]) 01/11/2018   Mass of left thigh 12/30/2017   Vitamin D deficiency 01/14/2016   Vitamin B12 deficiency 01/14/2016   Renal lesion 11/13/2015   Chronic  lower back pain 11/09/2015   BPH (benign prostatic hyperplasia) 11/01/2015   Elevated PSA 11/01/2015   Hyperlipidemia 11/01/2015   Bunion, left foot 11/01/2015   Sickle cell trait ([place]) 01/16/2015   Cervical spondylosis without myelopathy 01/09/2015   Carpal tunnel syndrome 01/09/2015   Rotator cuff syndrome of left shoulder 09/18/2014   LVH (left ventricular hypertrophy) 08/18/2014   Current smoker 10/03/2013    PCP: Delsa Grana PA  REFERRING PROVIDER: Allene Dillon FNP  REFERRING DIAG: cervicalgia  THERAPY DIAG:  Cervicalgia  Rationale for Evaluation and Treatment: Rehabilitation  ONSET DATE: 2014  SUBJECTIVE:                                                                                                                                                                                                          SUBJECTIVE STATEMENT: Reports he has been under a lot of stress lately with being his wife's caregiver, and with family members passing. Reports only 3/10 R sided cervical pain today due to completing his exercises at home.   PERTINENT HISTORY:  Pt is a 67 year old male presenting with chronic neck pain since 2014. Patient reports pain worsening over this year due to stress with his wife having dementia and losing his brother. Neck pain is R side of neck along levator with patient reporting some global neck pain as well. Current pain 5/10; worst 10/10. Pain is aggravated by sitting still with driving and laying down to go to sleep, rotating head and looking up, lifting, carrying, and lifting overhead. Has consistent n/t in L hand that he has had since "hand surgery 2 years ago". 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 Pt is the caregiver for his wife with dementia, who does not require physical assistance, but is a heavy emotional/mental burden. Endorses low back pain concordant with neck pain that is also a concern. No falls in past 6 months. Pt denies N/V, B&B changes, unexplained weight fluctuation, saddle paresthesia, fever, night sweats, or unrelenting night pain at this time.   PAIN:  Are you having pain? Yes: NPRS scale: 5/10 Pain location: R side of neck along levator scapulae Pain description: nagging, sharp  Aggravating factors: sitting still with driving and laying down to go to sleep, rotating head and looking up. lifting, carrying, and lifting overhead.  Relieving factors: pain medication   PRECAUTIONS: None  WEIGHT BEARING RESTRICTIONS: No  FALLS:  Has patient fallen in last 6 months? No  LIVING ENVIRONMENT: Lives with: lives with their spouse Lives in: House/apartment Stairs: Yes:  External: 3 steps; bilateral but cannot reach both Has following equipment at home: Single point cane  OCCUPATION: handyman  PLOF: Independent  PATIENT GOALS: decrease neck pain   NEXT MD VISIT: "in a few weeks"  OBJECTIVE:   DIAGNOSTIC FINDINGS:  Pt reported: 3 years ago MRI said I have decreased disc space in my neck   PATIENT SURVEYS:  FOTO 49 goal of 25  COGNITION: Overall cognitive status: Within functional limits for tasks assessed  SENSATION: WFL  POSTURE: rounded shoulders, forward head, and decreased thoracic kyphosis  PALPATION: Concordant pain to R levator; secondary R UT with tension and trigger points throughout  Global tension and pain to gross cervical musculature; pain to all CPA and UPAs C2 - CTJ noted guarding with hypomobility of tspine  Observation: scapular dyskinesis with overhead (R) mobility with excessive upward rotation UT involvement in raise of LUE   CERVICAL ROM:   Active ROM A/PROM (deg) eval  Flexion WNL   Extension WNL with concordant pain   Right lateral flexion WNL with "stretch sensation"  Left lateral flexion WNL with concordant pain to R side of the neck  Right rotation 38d with concordant pain   Left rotation 64d   (Blank rows = not tested)  PROM WNL all directions with concordant R sided neck pain with L rotation and L lateral flexion (stretch sensation)  UPPER EXTREMITY ROM:  Active ROM Right eval Left eval  Shoulder flexion WNL 64  Shoulder abduction WNL 86  Shoulder adduction WNL   Shoulder extension WNL WNL  Shoulder internal rotation WNL Apleys L5/S1  Shoulder external rotation WNL unable  Elbow flexion WNL WNL  Elbow extension WNL WNL with pain  Wrist flexion    Wrist extension    Wrist ulnar deviation    Wrist radial deviation    Wrist pronation    Wrist supination     (Blank rows = not tested)  UPPER EXTREMITY MMT:  MMT Right eval Left eval  Shoulder flexion 5 4+ (within available range)   Shoulder extension 5 5  Shoulder abduction 5 3+ (within available range)  Shoulder adduction    Shoulder extension 5 5 (within available range  Shoulder internal rotation 4+ 3  Shoulder external rotation 4 2+  Middle trapezius 3+ 3  Lower trapezius 3- 2+  Elbow flexion    Elbow extension    Wrist flexion    Wrist extension    Wrist ulnar deviation    Wrist radial deviation    Wrist pronation    Wrist supination    Grip strength     (Blank rows = not tested)  CERVICAL SPECIAL TESTS:  Neck flexor muscle endurance test: 31sec with stretch pain all the way to my low back , Upper limb tension test (ULTT): Negative, Spurling's test: Negative, and Distraction test: Negative    TODAY'S TREATMENT:  DATE:    Nustep seat 10 UE 14 L3 24mns for gently motion and upper back strengthening Seated upper cervical retraction 5secH x10 with patient reporting suboccipital stretch- difficulty with accessory muscle breathing and shoulder hiking Supine alt cervical rotation with maintained upper cervical retraction x12 each way Seated thoracic ext with overhead flex dowel for LUE AAROM with concordant cervical ext <> flex x12 with cuing for breath control with decent carry over Seated row OMEGA 20# x12 25# x12 35# x20 with cuing for neutral chin position and decreasing shoulder hiking Shrug 15# DB 2x 12 with good carry over of initial cuing  Post shoulder rolls x12 Seated cervical circles x12 each direction  UT stretch x30sec bilat Levator scapulae stretch 30sec bilat      PATIENT EDUCATION:  Education details: Patient was educated on diagnosis, anatomy and pathology involved, prognosis, role of PT, and was given an HEP, demonstrating exercise with proper form following verbal and tactile cues, and was given a paper hand out to continue exercise at home. Pt was educated on  and agreed to plan of care.  Person educated: Patient Education method: Explanation, Demonstration, and Handouts Education comprehension: verbalized understanding, returned demonstration, and verbal cues required  HOME EXERCISE PROGRAM: Access Code: [email or web address]: [email or web address] Date: 11/11/2022 Prepared by: CDurwin Reges Exercises - Seated Cervical Sidebending Stretch  - 1-3 x daily - 7 x weekly - 30-60sec hold - Seated Levator Scapulae Stretch  - 1-3 x daily - 7 x weekly - 30-60sec hold - Seated Scapular Retraction  - 8 x daily - 7 x weekly - 12-20 reps - 2sec hold  ASSESSMENT:  CLINICAL IMPRESSION: Session shortened d/t patient arriving late. PT continued therex progression for increased thoracic and cervical mobility with success. Patient is able to comply with all cuing for proper technique of therex with no pain reported at the end of session. Pt reports better pain management at home since last visit. Patient is able to demonstrate near full cervical mobility at end of session. PT will continue progression as able.     OBJECTIVE IMPAIRMENTS: decreased activity tolerance, decreased coordination, decreased endurance, decreased mobility, difficulty walking, decreased ROM, decreased strength, hypomobility, increased fascial restrictions, increased muscle spasms, impaired flexibility, impaired UE functional use, improper body mechanics, postural dysfunction, and pain.   ACTIVITY LIMITATIONS: carrying, lifting, sleeping, transfers, bathing, dressing, locomotion level, and caring for others  PARTICIPATION LIMITATIONS: meal prep, cleaning, laundry, driving, shopping, community activity, and occupation  PERSONAL FACTORS: Age, Fitness, Past/current experiences, Time since onset of injury/illness/exacerbation, and 1-2 comorbidities: HLD, chronic LBP, cervical spondylosis, emphysema  are also affecting patient's functional outcome.   REHAB POTENTIAL: Good  CLINICAL  DECISION MAKING: Evolving/moderate complexity  EVALUATION COMPLEXITY: Moderate   GOALS: Goals reviewed with patient? Yes  SHORT TERM GOALS: Target date: 12/03/22  Pt will be independent with HEP in order to improve strength and balance in order to decrease fall risk and improve function at home and work. Baseline: HEP given  Goal status: INITIAL   LONG TERM GOALS: Target date: 01/09/23  Patient will increase FOTO score to 57 to demonstrate predicted increase in functional mobility to complete ADLs Baseline: 11/11/22 49 Goal status: INITIAL  2.  Pt will demonstrate R cervical rotation to at least 60d in order to be able to drive safely  Baseline: 38d Goal status: INITIAL  3.  Pt will demonstrate gross periscapular strength of 4+/5 in order to complete heavy household ADLs Baseline: R/L Mid trap (T) 3+/3; lower trp (  Y) 3-/2_+  Goal status: INITIAL  4.  Pt will decrease worst pain as reported on NPRS by at least 3 points in order to demonstrate clinically significant reduction in pain.  Baseline: 10/10 Goal status: INITIAL     PLAN:  PT FREQUENCY: 1-2x/week  PT DURATION: 8 weeks  PLANNED INTERVENTIONS: Therapeutic exercises, Therapeutic activity, Neuromuscular re-education, Balance training, Gait training, Patient/Family education, Self Care, Joint mobilization, Joint manipulation, Dry Needling, Electrical stimulation, Spinal manipulation, Spinal mobilization, Cryotherapy, Moist heat, Traction, Ultrasound, Manual therapy, and Re-evaluation  PLAN FOR NEXT SESSION: HEP review  Durwin Reges DPT Durwin Reges, PT 12/08/2022, 10:52 AM

## 2022-12-09 ENCOUNTER — Encounter: Payer: Medicare Other | Admitting: Physical Therapy

## 2022-12-11 ENCOUNTER — Encounter: Payer: Medicare Other | Admitting: Physical Therapy

## 2022-12-16 ENCOUNTER — Encounter: Payer: Self-pay | Admitting: Physical Therapy

## 2022-12-16 ENCOUNTER — Ambulatory Visit: Payer: 59 | Admitting: Physical Therapy

## 2022-12-16 DIAGNOSIS — S61011A Laceration without foreign body of right thumb without damage to nail, initial encounter: Secondary | ICD-10-CM | POA: Diagnosis not present

## 2022-12-16 DIAGNOSIS — M542 Cervicalgia: Secondary | ICD-10-CM

## 2022-12-16 NOTE — Therapy (Signed)
OUTPATIENT PHYSICAL THERAPY CERVICAL EVALUATION   Patient Name: Manuel [name] MRN: [id number] DOB:Nov 06, 1955, 67 y.o., male Today's Date: 12/16/2022  END OF SESSION:  PT End of Session - 12/16/22 0917     Visit Number 4    Number of Visits 17    Date for PT Re-Evaluation 01/16/23    Authorization - Visit Number 4    Authorization - Number of Visits 17    Progress Note Due on Visit 10    PT Start Time 0916    PT Stop Time 0955    PT Time Calculation (min) 39 min    Activity Tolerance Patient tolerated treatment well    Behavior During Therapy [facility] for tasks assessed/performed               Past Medical History:  Diagnosis Date   Anemia    Anxiety    Aortic atherosclerosis ([place]) 01/11/2018   CT scan Feb 2019   Asthma    Bilateral carpal tunnel syndrome    Bilateral hand numbness    BPH without urinary obstruction    Cervical spondylosis without myelopathy    Chronic kidney disease    STAGE 3   Complete tear of rotator cuff    bilateral   COPD (chronic obstructive pulmonary disease) ([place])    Depression    Emphysema lung ([place]) 01/11/2018   Chest CT Feb 2019   History of stomach ulcers    HLD (hyperlipidemia)    LVH (left ventricular hypertrophy)    Personal history of tobacco use, presenting hazards to health 12/21/2015   Poor dentition    Sickle cell trait ([place])    Sleep apnea    Smoker    Past Surgical History:  Procedure Laterality Date   CARPAL TUNNEL RELEASE Left    COLONOSCOPY WITH PROPOFOL N/A 12/25/2015   Procedure: COLONOSCOPY WITH PROPOFOL;  Surgeon: Lucilla Lame, MD;  Location: [facility];  Service: Endoscopy;  Laterality: N/A;   COLONOSCOPY WITH PROPOFOL N/A 05/02/2021   Procedure: COLONOSCOPY WITH PROPOFOL;  Surgeon: Lucilla Lame, MD;  Location: [place];  Service: Endoscopy;  Laterality: N/A;   CYSTOURETHROSCOPY  08/25/12   with ureteral cathization w/wo retrograde pyelogram   HOLEP-LASER ENUCLEATION OF THE PROSTATE WITH  MORCELLATION N/A 04/05/2021   Procedure: HOLEP-LASER ENUCLEATION OF THE PROSTATE WITH MORCELLATION;  Surgeon: Billey Co, MD;  Location: [facility] OR[facility];  Service: Urology;  Laterality: N/A;   POLYPECTOMY N/A 05/02/2021   Procedure: POLYPECTOMY;  Surgeon: Lucilla Lame, MD;  Location: [place];  Service: Endoscopy;  Laterality: N/A;   ROTATOR CUFF REPAIR Right 01/25/15   SHOULDER ARTHROSCOPY WITH SUBACROMIAL DECOMPRESSION Right [id number]   TRANSURETHRAL RESECTION OF PROSTATE  08/25/12   Patient Active Problem List   Diagnosis Date Noted   Weight loss 04/14/2022   Caregiver stress 08/15/2021   Hx of colonic polyps    Polyp of sigmoid colon    MDD (major depressive disorder), recurrent, in full remission ([place]) 10/31/2020   Primary insomnia 10/31/2020   Chronic kidney disease, stage III (moderate) ([place]) 11/12/2018   Recurrent depression ([place]) 07/15/2018   Chronic tension-type headache, intractable 05/17/2018   Myofascial pain syndrome 05/17/2018   Chronic daily headache 04/12/2018   Coronary artery disease 01/11/2018   Aortic atherosclerosis ([place]) 01/11/2018   Pulmonary emphysema ([place]) 01/11/2018   Mass of left thigh 12/30/2017   Vitamin D deficiency 01/14/2016   Vitamin B12 deficiency 01/14/2016   Renal lesion 11/13/2015   Chronic lower back pain  11/09/2015   BPH (benign prostatic hyperplasia) 11/01/2015   Elevated PSA 11/01/2015   Hyperlipidemia 11/01/2015   Bunion, left foot 11/01/2015   Sickle cell trait ([place]) 01/16/2015   Cervical spondylosis without myelopathy 01/09/2015   Carpal tunnel syndrome 01/09/2015   Rotator cuff syndrome of left shoulder 09/18/2014   LVH (left ventricular hypertrophy) 08/18/2014   Current smoker 10/03/2013    PCP: Delsa Grana PA  REFERRING PROVIDER: Allene Dillon FNP  REFERRING DIAG: cervicalgia  THERAPY DIAG:  Cervicalgia  Rationale for Evaluation and Treatment: Rehabilitation  ONSET DATE: 2014  SUBJECTIVE:                                                                                                                                                                                                          SUBJECTIVE STATEMENT: Reports his neck feels good today. His L shoulder is really bothering him at the front 8/10; "I do not want to have surgery on my shoulder".   PERTINENT HISTORY:  Pt is a 67 year old male presenting with chronic neck pain since 2014. Patient reports pain worsening over this year due to stress with his wife having dementia and losing his brother. Neck pain is R side of neck along levator with patient reporting some global neck pain as well. Current pain 5/10; worst 10/10. Pain is aggravated by sitting still with driving and laying down to go to sleep, rotating head and looking up, lifting, carrying, and lifting overhead. Has consistent n/t in L hand that he has had since "hand surgery 2 years ago". 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 Pt is the caregiver for his wife with dementia, who does not require physical assistance, but is a heavy emotional/mental burden. Endorses low back pain concordant with neck pain that is also a concern. No falls in past 6 months. Pt denies N/V, B&B changes, unexplained weight fluctuation, saddle paresthesia, fever, night sweats, or unrelenting night pain at this time.   PAIN:  Are you having pain? Yes: NPRS scale: 5/10 Pain location: R side of neck along levator scapulae Pain description: nagging, sharp  Aggravating factors: sitting still with driving and laying down to go to sleep, rotating head and looking up. lifting, carrying, and lifting overhead.  Relieving factors: pain medication   PRECAUTIONS: None  WEIGHT BEARING RESTRICTIONS: No  FALLS:  Has patient fallen in last 6 months? No  LIVING ENVIRONMENT: Lives with: lives with their spouse Lives in: House/apartment Stairs: Yes: External: 3 steps; bilateral but cannot reach both Has following  equipment  at home: Single point cane  OCCUPATION: handyman  PLOF: Independent  PATIENT GOALS: decrease neck pain   NEXT MD VISIT: "in a few weeks"  OBJECTIVE:   DIAGNOSTIC FINDINGS:  Pt reported: 3 years ago MRI said I have decreased disc space in my neck   PATIENT SURVEYS:  FOTO 49 goal of 59  COGNITION: Overall cognitive status: Within functional limits for tasks assessed  SENSATION: WFL  POSTURE: rounded shoulders, forward head, and decreased thoracic kyphosis  PALPATION: Concordant pain to R levator; secondary R UT with tension and trigger points throughout  Global tension and pain to gross cervical musculature; pain to all CPA and UPAs C2 - CTJ noted guarding with hypomobility of tspine  Observation: scapular dyskinesis with overhead (R) mobility with excessive upward rotation UT involvement in raise of LUE   CERVICAL ROM:   Active ROM A/PROM (deg) eval  Flexion WNL   Extension WNL with concordant pain   Right lateral flexion WNL with "stretch sensation"  Left lateral flexion WNL with concordant pain to R side of the neck  Right rotation 38d with concordant pain   Left rotation 64d   (Blank rows = not tested)  PROM WNL all directions with concordant R sided neck pain with L rotation and L lateral flexion (stretch sensation)  UPPER EXTREMITY ROM:  Active ROM Right eval Left eval  Shoulder flexion WNL 64  Shoulder abduction WNL 86  Shoulder adduction WNL   Shoulder extension WNL WNL  Shoulder internal rotation WNL Apleys L5/S1  Shoulder external rotation WNL unable  Elbow flexion WNL WNL  Elbow extension WNL WNL with pain  Wrist flexion    Wrist extension    Wrist ulnar deviation    Wrist radial deviation    Wrist pronation    Wrist supination     (Blank rows = not tested)  UPPER EXTREMITY MMT:  MMT Right eval Left eval  Shoulder flexion 5 4+ (within available range)  Shoulder extension 5 5  Shoulder abduction 5 3+ (within available range)   Shoulder adduction    Shoulder extension 5 5 (within available range  Shoulder internal rotation 4+ 3  Shoulder external rotation 4 2+  Middle trapezius 3+ 3  Lower trapezius 3- 2+  Elbow flexion    Elbow extension    Wrist flexion    Wrist extension    Wrist ulnar deviation    Wrist radial deviation    Wrist pronation    Wrist supination    Grip strength     (Blank rows = not tested)  CERVICAL SPECIAL TESTS:  Neck flexor muscle endurance test: 31sec with stretch pain all the way to my low back , Upper limb tension test (ULTT): Negative, Spurling's test: Negative, and Distraction test: Negative    TODAY'S TREATMENT:  DATE:    Nustep seat 10 UE 14 L3 10mns for gently motion and upper back strengthening Sidelying open book x12 with archer pull; focus on thoracic rotation  Wall push up with RUE finger tip only 2x12 with cuing for set up with good carry over Prone thoracic ext with scapular retraction 2x 12  Seated row OMEGA x12 35#; x12 45# with cuing for neutral chin position and decreasing shoulder hiking Shrug 15# DB 2x 12 with good carry over of initial cuing  Post shoulder rolls x12 Seated cervical circles x12 each direction  Doorway pec stretch x30sec      PATIENT EDUCATION:  Education details: Patient was educated on diagnosis, anatomy and pathology involved, prognosis, role of PT, and was given an HEP, demonstrating exercise with proper form following verbal and tactile cues, and was given a paper hand out to continue exercise at home. Pt was educated on and agreed to plan of care.  Person educated: Patient Education method: Explanation, Demonstration, and Handouts Education comprehension: verbalized understanding, returned demonstration, and verbal cues required  HOME EXERCISE PROGRAM: Access Code: [email or web address]:  [email or web address] Date: 11/11/2022 Prepared by: CDurwin Reges Exercises - Seated Cervical Sidebending Stretch  - 1-3 x daily - 7 x weekly - 30-60sec hold - Seated Levator Scapulae Stretch  - 1-3 x daily - 7 x weekly - 30-60sec hold - Seated Scapular Retraction  - 8 x daily - 7 x weekly - 12-20 reps - 2sec hold  ASSESSMENT:  CLINICAL IMPRESSION:  PT continued therex progression for increased thoracic and cervical mobility with success.session with increased focus on scapulothoracic mobility > cervical mobility to patient with little to no cervical pain and full ROM prior to session. Pt is able to comply wit all cuing for proper technique of therex with excellent motivation throughout session. At conclusion of session patient with only 2/10 pain, and with increased L shoulder mobility, which he is pleased with. PT will continue progression as able.     OBJECTIVE IMPAIRMENTS: decreased activity tolerance, decreased coordination, decreased endurance, decreased mobility, difficulty walking, decreased ROM, decreased strength, hypomobility, increased fascial restrictions, increased muscle spasms, impaired flexibility, impaired UE functional use, improper body mechanics, postural dysfunction, and pain.   ACTIVITY LIMITATIONS: carrying, lifting, sleeping, transfers, bathing, dressing, locomotion level, and caring for others  PARTICIPATION LIMITATIONS: meal prep, cleaning, laundry, driving, shopping, community activity, and occupation  PERSONAL FACTORS: Age, Fitness, Past/current experiences, Time since onset of injury/illness/exacerbation, and 1-2 comorbidities: HLD, chronic LBP, cervical spondylosis, emphysema  are also affecting patient's functional outcome.   REHAB POTENTIAL: Good  CLINICAL DECISION MAKING: Evolving/moderate complexity  EVALUATION COMPLEXITY: Moderate   GOALS: Goals reviewed with patient? Yes  SHORT TERM GOALS: Target date: 12/03/22  Pt will be  independent with HEP in order to improve strength and balance in order to decrease fall risk and improve function at home and work. Baseline: HEP given  Goal status: INITIAL   LONG TERM GOALS: Target date: 01/09/23  Patient will increase FOTO score to 57 to demonstrate predicted increase in functional mobility to complete ADLs Baseline: 11/11/22 49 Goal status: INITIAL  2.  Pt will demonstrate R cervical rotation to at least 60d in order to be able to drive safely  Baseline: 38d Goal status: INITIAL  3.  Pt will demonstrate gross periscapular strength of 4+/5 in order to complete heavy household ADLs Baseline: R/L Mid trap (T) 3+/3; lower trp (Y) 3-/2_+  Goal status: INITIAL  4.  Pt will decrease worst pain  as reported on NPRS by at least 3 points in order to demonstrate clinically significant reduction in pain.  Baseline: 10/10 Goal status: INITIAL     PLAN:  PT FREQUENCY: 1-2x/week  PT DURATION: 8 weeks  PLANNED INTERVENTIONS: Therapeutic exercises, Therapeutic activity, Neuromuscular re-education, Balance training, Gait training, Patient/Family education, Self Care, Joint mobilization, Joint manipulation, Dry Needling, Electrical stimulation, Spinal manipulation, Spinal mobilization, Cryotherapy, Moist heat, Traction, Ultrasound, Manual therapy, and Re-evaluation  PLAN FOR NEXT SESSION: HEP review  Durwin Reges DPT Durwin Reges, PT 12/16/2022, 10:54 AM

## 2022-12-19 ENCOUNTER — Ambulatory Visit: Payer: 59 | Admitting: Physical Therapy

## 2022-12-22 ENCOUNTER — Encounter: Payer: Medicare Other | Admitting: Physical Therapy

## 2022-12-23 ENCOUNTER — Institutional Professional Consult (permissible substitution): Payer: 59 | Admitting: Pulmonary Disease

## 2022-12-23 ENCOUNTER — Ambulatory Visit: Payer: 59 | Admitting: Physical Therapy

## 2022-12-25 ENCOUNTER — Encounter: Payer: Medicare Other | Admitting: Physical Therapy

## 2022-12-26 ENCOUNTER — Ambulatory Visit: Payer: 59 | Admitting: Physical Therapy

## 2022-12-29 ENCOUNTER — Encounter: Payer: Medicare Other | Admitting: Physical Therapy

## 2022-12-29 DIAGNOSIS — R809 Proteinuria, unspecified: Secondary | ICD-10-CM | POA: Diagnosis not present

## 2022-12-29 DIAGNOSIS — N1831 Chronic kidney disease, stage 3a: Secondary | ICD-10-CM | POA: Diagnosis not present

## 2022-12-29 DIAGNOSIS — I129 Hypertensive chronic kidney disease with stage 1 through stage 4 chronic kidney disease, or unspecified chronic kidney disease: Secondary | ICD-10-CM | POA: Diagnosis not present

## 2022-12-30 ENCOUNTER — Ambulatory Visit: Payer: 59 | Admitting: Physical Therapy

## 2023-01-01 ENCOUNTER — Encounter: Payer: Medicare Other | Admitting: Physical Therapy

## 2023-01-02 ENCOUNTER — Ambulatory Visit: Payer: 59 | Admitting: Physical Therapy

## 2023-01-05 ENCOUNTER — Telehealth: Payer: Self-pay | Admitting: Family Medicine

## 2023-01-05 NOTE — Telephone Encounter (Signed)
Pt is calling to check on the paperwork for the Department of Social Service. Please advise [phone]

## 2023-01-05 NOTE — Telephone Encounter (Signed)
Paper work on Radiographer, therapeutic

## 2023-01-06 ENCOUNTER — Ambulatory Visit: Payer: 59 | Attending: Family Medicine | Admitting: Physical Therapy

## 2023-01-06 ENCOUNTER — Encounter: Payer: Self-pay | Admitting: Physical Therapy

## 2023-01-06 ENCOUNTER — Ambulatory Visit: Payer: 59

## 2023-01-06 DIAGNOSIS — M542 Cervicalgia: Secondary | ICD-10-CM | POA: Insufficient documentation

## 2023-01-06 NOTE — Telephone Encounter (Signed)
Pt said if it is not filled out soon they will close his case.

## 2023-01-06 NOTE — Therapy (Signed)
OUTPATIENT PHYSICAL THERAPY CERVICAL TREATMENT/ PROGRESS NOTE REPORTING PERIOD 11/11/22 - 01/06/23   Patient Name: Manuel [name] MRN: [id number] DOB:30-Jul-1956, 67 y.o., male Today's Date: 01/06/2023  END OF SESSION:  PT End of Session - 01/06/23 1052     Visit Number 5    Number of Visits 17    Date for PT Re-Evaluation 01/16/23    Authorization - Visit Number 5    Authorization - Number of Visits 17    Progress Note Due on Visit 10    PT Start Time [id number]    PT Stop Time 1127    PT Time Calculation (min) 38 min    Activity Tolerance Patient tolerated treatment well    Behavior During Therapy [facility] for tasks assessed/performed                Past Medical History:  Diagnosis Date   Anemia    Anxiety    Aortic atherosclerosis ([place]) 01/11/2018   CT scan Feb 2019   Asthma    Bilateral carpal tunnel syndrome    Bilateral hand numbness    BPH without urinary obstruction    Cervical spondylosis without myelopathy    Chronic kidney disease    STAGE 3   Complete tear of rotator cuff    bilateral   COPD (chronic obstructive pulmonary disease) ([place])    Depression    Emphysema lung ([place]) 01/11/2018   Chest CT Feb 2019   History of stomach ulcers    HLD (hyperlipidemia)    LVH (left ventricular hypertrophy)    Personal history of tobacco use, presenting hazards to health 12/21/2015   Poor dentition    Sickle cell trait ([place])    Sleep apnea    Smoker    Past Surgical History:  Procedure Laterality Date   CARPAL TUNNEL RELEASE Left    COLONOSCOPY WITH PROPOFOL N/A 12/25/2015   Procedure: COLONOSCOPY WITH PROPOFOL;  Surgeon: Lucilla Lame, MD;  Location: [facility];  Service: Endoscopy;  Laterality: N/A;   COLONOSCOPY WITH PROPOFOL N/A 05/02/2021   Procedure: COLONOSCOPY WITH PROPOFOL;  Surgeon: Lucilla Lame, MD;  Location: [place];  Service: Endoscopy;  Laterality: N/A;   CYSTOURETHROSCOPY  08/25/12   with ureteral cathization w/wo retrograde pyelogram    HOLEP-LASER ENUCLEATION OF THE PROSTATE WITH MORCELLATION N/A 04/05/2021   Procedure: HOLEP-LASER ENUCLEATION OF THE PROSTATE WITH MORCELLATION;  Surgeon: Billey Co, MD;  Location: [facility];  Service: Urology;  Laterality: N/A;   POLYPECTOMY N/A 05/02/2021   Procedure: POLYPECTOMY;  Surgeon: Lucilla Lame, MD;  Location: [place];  Service: Endoscopy;  Laterality: N/A;   ROTATOR CUFF REPAIR Right 01/25/15   SHOULDER ARTHROSCOPY WITH SUBACROMIAL DECOMPRESSION Right [id number]   TRANSURETHRAL RESECTION OF PROSTATE  08/25/12   Patient Active Problem List   Diagnosis Date Noted   Weight loss 04/14/2022   Caregiver stress 08/15/2021   Hx of colonic polyps    Polyp of sigmoid colon    MDD (major depressive disorder), recurrent, in full remission ([place]) 10/31/2020   Primary insomnia 10/31/2020   Chronic kidney disease, stage III (moderate) ([place]) 11/12/2018   Recurrent depression ([place]) 07/15/2018   Chronic tension-type headache, intractable 05/17/2018   Myofascial pain syndrome 05/17/2018   Chronic daily headache 04/12/2018   Coronary artery disease 01/11/2018   Aortic atherosclerosis ([place]) 01/11/2018   Pulmonary emphysema ([place]) 01/11/2018   Mass of left thigh 12/30/2017   Vitamin D deficiency 01/14/2016   Vitamin B12 deficiency 01/14/2016   Renal  lesion 11/13/2015   Chronic lower back pain 11/09/2015   BPH (benign prostatic hyperplasia) 11/01/2015   Elevated PSA 11/01/2015   Hyperlipidemia 11/01/2015   Bunion, left foot 11/01/2015   Sickle cell trait ([place]) 01/16/2015   Cervical spondylosis without myelopathy 01/09/2015   Carpal tunnel syndrome 01/09/2015   Rotator cuff syndrome of left shoulder 09/18/2014   LVH (left ventricular hypertrophy) 08/18/2014   Current smoker 10/03/2013    PCP: Delsa Grana PA  REFERRING PROVIDER: Allene Dillon FNP  REFERRING DIAG: cervicalgia  THERAPY DIAG:  Cervicalgia  Rationale for Evaluation and Treatment: Rehabilitation  ONSET  DATE: 2014  SUBJECTIVE:                                                                                                                                                                                                         SUBJECTIVE STATEMENT: Pt reports having some increased pain in the mornings following "a lot going on with my family". Currently having 5/10 pain. Completing HEP. Marland Kitchen Pt reports feeling >75% better since starting PT. Reports most of his pain is occurring in the morning before he gets out of bed but he is able to get rid of this pain quickly with HEP.   PERTINENT HISTORY:  Pt is a 67 year old male presenting with chronic neck pain since 2014. Patient reports pain worsening over this year due to stress with his wife having dementia and losing his brother. Neck pain is R side of neck along levator with patient reporting some global neck pain as well. Current pain 5/10; worst 10/10. Pain is aggravated by sitting still with driving and laying down to go to sleep, rotating head and looking up, lifting, carrying, and lifting overhead. Has consistent n/t in L hand that he has had since "hand surgery 2 years ago". 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 Pt is the caregiver for his wife with dementia, who does not require physical assistance, but is a heavy emotional/mental burden. Endorses low back pain concordant with neck pain that is also a concern. No falls in past 6 months. Pt denies N/V, B&B changes, unexplained weight fluctuation, saddle paresthesia, fever, night sweats, or unrelenting night pain at this time.   PAIN:  Are you having pain? Yes: NPRS scale: 5/10 Pain location: R side of neck along levator scapulae Pain description: nagging, sharp  Aggravating factors: sitting still with driving and laying down to go to sleep, rotating head and looking up. lifting, carrying, and lifting overhead.  Relieving factors: pain medication   PRECAUTIONS: None  WEIGHT BEARING  RESTRICTIONS: No  FALLS:  Has patient fallen in last 6 months? No  LIVING ENVIRONMENT: Lives with: lives with their spouse Lives in: House/apartment Stairs: Yes: External: 3 steps; bilateral but cannot reach both Has following equipment at home: Single point cane  OCCUPATION: handyman  PLOF: Independent  PATIENT GOALS: decrease neck pain   NEXT MD VISIT: "in a few weeks"  OBJECTIVE:   DIAGNOSTIC FINDINGS:  Pt reported: 3 years ago MRI said I have decreased disc space in my neck   PATIENT SURVEYS:  FOTO 49 goal of 28  COGNITION: Overall cognitive status: Within functional limits for tasks assessed  SENSATION: WFL  POSTURE: rounded shoulders, forward head, and decreased thoracic kyphosis  PALPATION: Concordant pain to R levator; secondary R UT with tension and trigger points throughout  Global tension and pain to gross cervical musculature; pain to all CPA and UPAs C2 - CTJ noted guarding with hypomobility of tspine  Observation: scapular dyskinesis with overhead (R) mobility with excessive upward rotation UT involvement in raise of LUE   CERVICAL ROM:   Active ROM A/PROM (deg) eval  Flexion WNL   Extension WNL with concordant pain   Right lateral flexion WNL with "stretch sensation"  Left lateral flexion WNL with concordant pain to R side of the neck  Right rotation 38d with concordant pain   Left rotation 64d   (Blank rows = not tested)  PROM WNL all directions with concordant R sided neck pain with L rotation and L lateral flexion (stretch sensation)  UPPER EXTREMITY ROM:  Active ROM Right eval Left eval  Shoulder flexion WNL 64  Shoulder abduction WNL 86  Shoulder adduction WNL   Shoulder extension WNL WNL  Shoulder internal rotation WNL Apleys L5/S1  Shoulder external rotation WNL unable  Elbow flexion WNL WNL  Elbow extension WNL WNL with pain  Wrist flexion    Wrist extension    Wrist ulnar deviation    Wrist radial deviation    Wrist  pronation    Wrist supination     (Blank rows = not tested)  UPPER EXTREMITY MMT:  MMT Right eval Left eval  Shoulder flexion 5 4+ (within available range)  Shoulder extension 5 5  Shoulder abduction 5 3+ (within available range)  Shoulder adduction    Shoulder extension 5 5 (within available range  Shoulder internal rotation 4+ 3  Shoulder external rotation 4 2+  Middle trapezius 3+ 3  Lower trapezius 3- 2+  Elbow flexion    Elbow extension    Wrist flexion    Wrist extension    Wrist ulnar deviation    Wrist radial deviation    Wrist pronation    Wrist supination    Grip strength     (Blank rows = not tested)  CERVICAL SPECIAL TESTS:  Neck flexor muscle endurance test: 31sec with stretch pain all the way to my low back , Upper limb tension test (ULTT): Negative, Spurling's test: Negative, and Distraction test: Negative    TODAY'S TREATMENT:  DATE:    Nustep seat 10 UE 14 L3 63mns for gently motion and upper back strengthening   ROM and MMT assessment  PT reviewed the following HEP with patient with patient able to demonstrate a set of the following with min cuing for correction needed. PT educated patient on parameters of therex (how/when to inc/decrease intensity, frequency, rep/set range, stretch hold time, and purpose of therex) with verbalized understanding.   Access Code: [email or web address] Low Trap Setting at [place] - 1-2 x weekly - 3 sets - 8-12 reps - Standing Row with Resistance with Anchored Resistance at Chest Height Palms Down  - 1-2 x weekly - 3 sets - 8-12 reps - Standing Upright Shoulder Row with Barbell  - 1-2 x weekly - 3 sets - 8-12 reps - Seated Thoracic Lumbar Extension with Pectoralis Stretch  - 1-2 x daily - 7 x weekly - 12-20 reps  Verbal review of current stretching regimen with understanding     PATIENT EDUCATION:   Education details: Patient was educated on diagnosis, anatomy and pathology involved, prognosis, role of PT, and was given an HEP, demonstrating exercise with proper form following verbal and tactile cues, and was given a paper hand out to continue exercise at home. Pt was educated on and agreed to plan of care.  Person educated: Patient Education method: Explanation, Demonstration, and Handouts Education comprehension: verbalized understanding, returned demonstration, and verbal cues required  HOME EXERCISE PROGRAM: Access Code: [email or web address]: [email or web address] Date: 11/11/2022 Prepared by: CDurwin Reges Exercises - Seated Cervical Sidebending Stretch  - 1-3 x daily - 7 x weekly - 30-60sec hold - Seated Levator Scapulae Stretch  - 1-3 x daily - 7 x weekly - 30-60sec hold - Seated Scapular Retraction  - 8 x daily - 7 x weekly - 12-20 reps - 2sec hold  ASSESSMENT:  CLINICAL IMPRESSION:  PT reassessed goals this session where patient has met ROM and pain goals, with great progress made toward FOTO and periscapular strength goals,. PT updated HEP to reflect current strength deficits with pt able to verbalize and demonstrate understanding of this. PT will continue progression as able.     OBJECTIVE IMPAIRMENTS: decreased activity tolerance, decreased coordination, decreased endurance, decreased mobility, difficulty walking, decreased ROM, decreased strength, hypomobility, increased fascial restrictions, increased muscle spasms, impaired flexibility, impaired UE functional use, improper body mechanics, postural dysfunction, and pain.   ACTIVITY LIMITATIONS: carrying, lifting, sleeping, transfers, bathing, dressing, locomotion level, and caring for others  PARTICIPATION LIMITATIONS: meal prep, cleaning, laundry, driving, shopping, community activity, and occupation  PERSONAL FACTORS: Age, Fitness, Past/current experiences, Time since onset of injury/illness/exacerbation,  and 1-2 comorbidities: HLD, chronic LBP, cervical spondylosis, emphysema  are also affecting patient's functional outcome.   REHAB POTENTIAL: Good  CLINICAL DECISION MAKING: Evolving/moderate complexity  EVALUATION COMPLEXITY: Moderate   GOALS: Goals reviewed with patient? Yes  SHORT TERM GOALS: Target date: 12/03/22  Pt will be independent with HEP in order to improve strength and balance in order to decrease fall risk and improve function at home and work. Baseline: HEP given ; HEP updated Goal status: REVISED   LONG TERM GOALS: Target date: 01/09/23  Patient will increase FOTO score to 57 to demonstrate predicted increase in functional mobility to complete ADLs Baseline: 11/11/22 49; 01/06/23 54 ONGOING  2.  Pt will demonstrate R cervical rotation to at least 60d in order to be able to drive safely  Baseline: 38d; 01/09/23 R: 78d L 74d Goal status: MET  3.  Pt will demonstrate gross periscapular strength of 4+/5 in order to complete heavy household ADLs Baseline: R/L Mid trap (T) 3+/3; lower trp (Y) 3-/2+; 01/09/23 R/L Mid trap (T) 5/4+; lower trp (Y) 4+/3+  Goal status: ONGOING  4.  Pt will decrease worst pain as reported on NPRS by at least 3 points in order to demonstrate clinically significant reduction in pain.  Baseline: 10/10; 5/10 throughout the day  Goal status: MET     PLAN:  PT FREQUENCY: 1-2x/week  PT DURATION: 8 weeks  PLANNED INTERVENTIONS: Therapeutic exercises, Therapeutic activity, Neuromuscular re-education, Balance training, Gait training, Patient/Family education, Self Care, Joint mobilization, Joint manipulation, Dry Needling, Electrical stimulation, Spinal manipulation, Spinal mobilization, Cryotherapy, Moist heat, Traction, Ultrasound, Manual therapy, and Re-evaluation  PLAN FOR NEXT SESSION: HEP review  Durwin Reges DPT Durwin Reges, PT 01/06/2023, 11:20 AM

## 2023-01-08 ENCOUNTER — Ambulatory Visit: Payer: 59 | Admitting: Physical Therapy

## 2023-01-08 ENCOUNTER — Other Ambulatory Visit: Payer: Self-pay

## 2023-01-08 DIAGNOSIS — N138 Other obstructive and reflux uropathy: Secondary | ICD-10-CM

## 2023-01-09 ENCOUNTER — Other Ambulatory Visit: Payer: 59

## 2023-01-09 DIAGNOSIS — N138 Other obstructive and reflux uropathy: Secondary | ICD-10-CM | POA: Diagnosis not present

## 2023-01-10 LAB — PSA: Prostate Specific Ag, Serum: 1.5 ng/mL (ref 0.0–4.0)

## 2023-01-13 ENCOUNTER — Ambulatory Visit: Payer: 59 | Admitting: Student in an Organized Health Care Education/Training Program

## 2023-01-13 ENCOUNTER — Ambulatory Visit: Payer: 59 | Admitting: Physical Therapy

## 2023-01-14 ENCOUNTER — Ambulatory Visit: Payer: 59 | Admitting: Student in an Organized Health Care Education/Training Program

## 2023-01-15 DIAGNOSIS — H2513 Age-related nuclear cataract, bilateral: Secondary | ICD-10-CM | POA: Diagnosis not present

## 2023-01-15 NOTE — Progress Notes (Unsigned)
01/08/2022  12:49 PM   Manuel [name] 11-30-1955 [id number]  Referring provider: Delsa Grana, PA-C [address] [place],  [place] [address]  Chief Complaint  Patient presents with   Erectile Dysfunction   Benign Prostatic Hypertrophy    Urological history: 1. Elevated PSA -PSA Trend  Prostate Specific Ag, Serum  Latest Ref Rng 0.0 - 4.0 ng/mL  03/22/2018 5.1 (H)   11/30/2018 5.3 (H)   12/23/2018 5.1 (H)   03/29/2019 5.6 (H)   05/15/2020 5.2 (H)   11/12/2020 5.5 (H)   07/09/2021 1.4   01/02/2022 1.4   01/09/2023 1.5     Legend: (H) High  -Prostate MRI on 07/25/2019 revealed a small PI-RADS category 4 lesion in the right posterolateral peripheral zone of the prostate apex is present. Targeting data sent to [facility].  Considerable benign prostatic hypertrophy, prostate volume 78.53 cubic cm.  Thin peripheral zone with generalized low T2 signal, probably postinflammatory.  Sigmoid colon diverticulosis. 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 BPH with LU TS -s/p HoLEP 04/2021 -I PSS 3/0  3. High risk hematuria -smoker -work up in 2013.  He underwent a CT Urogram, cystoscopy with bilateral retrogrades and ultmately a TURP with Dr. Darcus Austin at [facility].  No malignancies were discovered.  Hematuria was from his enlarged, friable prostate.  He has not had any gross hematuria since that time.  A too small to characterize lesion was seen in the left midpole of the kidney on the CT Urogram in 2013.   RUS 05/2019 Increased echogenicity in the cortex of both kidneys is consistent with medical renal disease. No other acute abnormalities identified -cysto 10/2019 no malignancy found  -CTU 2022 and cysto 2022 negative for malignancy -no reports of gross hematuria -UA (12/2022) negative for micro heme  4. ED -contributing factors of age, smoking, BPH, CAD, HLD, depression and antidepressents -SHIM 11 -sildenafil 20  mg, 3 to 5 tablets prior to intercourse  Chief Complaint  Patient presents with   Erectile Dysfunction   Benign Prostatic Hypertrophy     HPI: Manuel [name] is a 67 y.o.  male who presents today for a 12 month follow up.  I PSS 3/0  He has no urinary complaints.     IPSS     Row Name 01/08/22 1000         International Prostate Symptom Score   How often have you had the sensation of not emptying your bladder? Not at All     How often have you had to urinate less than every two hours? Less than half the time     How often have you found you stopped and started again several times when you urinated? Not at All     How often have you found it difficult to postpone urination? Not at All     How often have you had a weak urinary stream? Not at All     How often have you had to strain to start urination? Not at All     How many times did you typically get up at night to urinate? 1 Time     Total IPSS Score 3       Quality of Life due to urinary symptoms   If you were to spend the rest of your life with your urinary condition just the way it is now how would you feel about that? Mixed  Score:  1-7 Mild 8-19 Moderate 20-35 Severe  SHIM 11  Patient still having spontaneous erections.  He denies any pain or curvature with erections.     SHIM     Row Name 01/08/22 1002         SHIM: Over the last 6 months:   How do you rate your confidence that you could get and keep an erection? Low     When you had erections with sexual stimulation, how often were your erections hard enough for penetration (entering your partner)? A Few Times (much less than half the time)     During sexual intercourse, how often were you able to maintain your erection after you had penetrated (entered) your partner? Almost Never or Never     During sexual intercourse, how difficult was it to maintain your erection to completion of intercourse? Slightly Difficult     When you  attempted sexual intercourse, how often was it satisfactory for you? A Few Times (much less than half the time)       SHIM Total Score   SHIM 11               Score: 1-7 Severe ED 8-11 Moderate ED 12-16 Mild-Moderate ED 17-21 Mild ED 22-25 No ED   PMH: Past Medical History:  Diagnosis Date   Anemia    Anxiety    Aortic atherosclerosis ([place]) 01/11/2018   CT scan Feb 2019   Asthma    Bilateral carpal tunnel syndrome    Bilateral hand numbness    BPH without urinary obstruction    Cervical spondylosis without myelopathy    Chronic kidney disease    STAGE 3   Complete tear of rotator cuff    bilateral   COPD (chronic obstructive pulmonary disease) ([place])    Depression    Emphysema lung ([place]) 01/11/2018   Chest CT Feb 2019   History of stomach ulcers    HLD (hyperlipidemia)    LVH (left ventricular hypertrophy)    Personal history of tobacco use, presenting hazards to health 12/21/2015   Poor dentition    Sickle cell trait (HCC)    Sleep apnea    Smoker     Surgical History: Past Surgical History:  Procedure Laterality Date   CARPAL TUNNEL RELEASE Left    COLONOSCOPY WITH PROPOFOL N/A 12/25/2015   Procedure: COLONOSCOPY WITH PROPOFOL;  Surgeon: Lucilla Lame, MD;  Location: [facility];  Service: Endoscopy;  Laterality: N/A;   COLONOSCOPY WITH PROPOFOL N/A 05/02/2021   Procedure: COLONOSCOPY WITH PROPOFOL;  Surgeon: Lucilla Lame, MD;  Location: [place];  Service: Endoscopy;  Laterality: N/A;   CYSTOURETHROSCOPY  08/25/12   with ureteral cathization w/wo retrograde pyelogram   HOLEP-LASER ENUCLEATION OF THE PROSTATE WITH MORCELLATION N/A 04/05/2021   Procedure: HOLEP-LASER ENUCLEATION OF THE PROSTATE WITH MORCELLATION;  Surgeon: Billey Co, MD;  Location: [facility];  Service: Urology;  Laterality: N/A;   POLYPECTOMY N/A 05/02/2021   Procedure: POLYPECTOMY;  Surgeon: Lucilla Lame, MD;  Location: [facility];  Service: Endoscopy;  Laterality:  N/A;   ROTATOR CUFF REPAIR Right 01/25/15   SHOULDER ARTHROSCOPY WITH SUBACROMIAL DECOMPRESSION Right 032416   TRANSURETHRAL RESECTION OF PROSTATE  08/25/12    Home Medications:  Allergies as of 01/08/2022       Reactions   Penicillin G Other (See Comments)   Childhood        Medication List        Accurate as of January 08, 2022 11:59 PM. If you have any questions, ask your nurse or doctor.          STOP taking these medications    Bactroban Nasal 2 % Generic drug: mupirocin nasal ointment Stopped by: Arthella Headings, PA-C   diclofenac Sodium 1 % Gel Commonly known as: VOLTAREN Stopped by: Weston Kallman, PA-C   doxycycline 100 MG tablet Commonly known as: VIBRA-TABS Stopped by: Tuleen Mandelbaum, PA-C   sildenafil 100 MG tablet Commonly known as: VIAGRA Stopped by: Bernardo Brayman, PA-C   tamsulosin 0.4 MG Caps capsule Commonly known as: FLOMAX Stopped by: Kyiesha Millward, PA-C       TAKE these medications    diphenhydramine-acetaminophen 25-500 MG Tabs tablet Commonly known as: TYLENOL PM Take 2 tablets by mouth at bedtime as needed (pain).   ezetimibe 10 MG tablet Commonly known as: ZETIA Take 10 mg by mouth See admin instructions. 2 times a month   rosuvastatin 20 MG tablet Commonly known as: Crestor Take 1 tablet (20 mg total) by mouth at bedtime.   vitamin B-12 500 MCG tablet Commonly known as: CYANOCOBALAMIN Take 2 tablets (1,000 mcg total) by mouth daily.   VITAMIN D3 PO Take by mouth.        Allergies:  Allergies  Allergen Reactions   Penicillin G Other (See Comments)    Childhood    Family History: Family History  Problem Relation Age of Onset   Aneurysm Mother    Hypertension Mother    Diabetes Father    Hypertension Father    Prostate cancer Father    Lupus Father    Cancer Maternal Aunt    Cancer Maternal Uncle    Diabetes Paternal Uncle    Heart disease Cousin    Prostate cancer Paternal Uncle    COPD Neg Hx     Stroke Neg Hx    Kidney disease Neg Hx     Social History:  reports that he has been smoking cigarettes. He has a 17.50 pack-year smoking history. He has never used smokeless tobacco. He reports that he does not drink alcohol and does not use drugs.  ROS: For pertinent review of systems please refer to history of present illness  Physical Exam: BP 122/77   Pulse 72   Ht 5\' 10"  (1.778 m)   Wt 157 lb (71.2 kg)   SpO2 97%   BMI 22.53 kg/m   Constitutional:  Well nourished. Alert and oriented, No acute distress. HEENT: [place] AT, moist mucus membranes.  Trachea midline Cardiovascular: No clubbing, cyanosis, or edema. Respiratory: Normal respiratory effort, no increased work of breathing. GU: No CVA tenderness.  No bladder fullness or masses.  Patient with circumcised/uncircumcised phallus. ***Foreskin easily retracted***  Urethral meatus is patent.  No penile discharge. No penile lesions or rashes. Scrotum without lesions, cysts, rashes and/or edema.  Testicles are located scrotally bilaterally. No masses are appreciated in the testicles. Left and right epididymis are normal. Rectal: Patient with  normal sphincter tone. Anus and perineum without scarring or rashes. No rectal masses are appreciated. Prostate is approximately *** grams, *** nodules are appreciated. Seminal vesicles are normal. Neurologic: Grossly intact, no focal deficits, moving all 4 extremities. Psychiatric: Normal mood and affect.   Laboratory Data: Serum creatinine (1.43)   Urinalysis Component Ref Range & Units 2 wk ago  Color, Urine YELLOW YELLOW  Appearance Urine CLEAR CLEAR  Specific Gravity, UA 1.001 - 1.035 1.022  pH Urine 5.0 - 8.0 6.0  Glucose, Ur NEGATIVE  NEGATIVE  Bilirubin, Urine NEGATIVE NEGATIVE  Ketones, Urine NEGATIVE NEGATIVE  Hemoglobin Ur Ql Strip NEGATIVE NEGATIVE  Protein, Ur NEGATIVE 1+ Abnormal   Nitrite, Urine NEGATIVE NEGATIVE  WBC Esterase Urine NEGATIVE NEGATIVE  WBC,  Urine < OR = 5 /HPF 0-5  RBC, Urine < OR = 2 /HPF NONE SEEN  Epithelial Cells in Urine < OR = 5 /HPF NONE SEEN  Trans Epithelial, Urine < OR = 5 /HPF CANCELED  Comment: Result canceled by the ancillary.  Renal Epithelial Cells, Urine < OR = 3 /HPF CANCELED  Comment: Result canceled by the ancillary.  Bacteria NONE SEEN /HPF NONE SEEN  Calcium Oxalate Crystals, Urine NONE OR FEW /HPF CANCELED  Comment: Result canceled by the ancillary.  Triple Phosphate Crystals, Urine NONE OR FEW /HPF CANCELED  Comment: Result canceled by the ancillary.  Uric Acid Crystals, Urine NONE OR FEW /HPF CANCELED  Comment: Result canceled by the ancillary.  Amorphous Sediments NONE OR FEW /HPF CANCELED  Comment: Result canceled by the ancillary.  Crystals NONE SEEN /HPF CANCELED  Comment: Result canceled by the ancillary.  Hyaline Casts, Urine NONE SEEN /LPF 0-5 Abnormal   Granular Casts, Urine NONE SEEN /LPF CANCELED  Comment: Result canceled by the ancillary.  Casts NONE SEEN /LPF CANCELED  Comment: Result canceled by the ancillary.  Yeast, UA NONE SEEN /HPF CANCELED  Comment: Result canceled by the ancillary.  Note:   Comment:      This urine was analyzed for the presence of WBC,      RBC, bacteria, casts, and other formed elements.      Only those elements seen were reported.                Resulting Agency [facility]   Specimen Collected: 12/29/22 14:18   Performed by: Yvetta Coder Last Resulted: 12/30/22 11:12  Received From: [facility]  Result Received: 12/31/22 15:38  I have reviewed the labs.  Pertinent Imaging N/A   Assessment & Plan:    1. BPH with LU TS -PSA normal  -prostate chips 04/2021-negative for malignancy  -DRE benign -UA benign -PVR < 300 cc -symptoms - none  2. Erectile dysfunction -continue sildenafil 20 mg, on-demand-dosing  3. High risk hematuria -work up completed in 2013 and 2022 - positive for an enlarged  prostate -no reports of gross hematuria -UA negative for micro heme  Return in about 1 year (around 01/09/2023) for IPSS, SHIM, PSA, UA and exam.  Zara Council, [place] [address], [place], [place] [address] [phone]

## 2023-01-16 ENCOUNTER — Ambulatory Visit (INDEPENDENT_AMBULATORY_CARE_PROVIDER_SITE_OTHER): Payer: 59 | Admitting: Urology

## 2023-01-16 ENCOUNTER — Encounter: Payer: Self-pay | Admitting: Urology

## 2023-01-16 ENCOUNTER — Encounter: Payer: 59 | Admitting: Physical Therapy

## 2023-01-16 VITALS — BP 88/61 | HR 90 | Ht 70.0 in | Wt 162.0 lb

## 2023-01-16 DIAGNOSIS — R319 Hematuria, unspecified: Secondary | ICD-10-CM

## 2023-01-16 DIAGNOSIS — N529 Male erectile dysfunction, unspecified: Secondary | ICD-10-CM | POA: Diagnosis not present

## 2023-01-16 DIAGNOSIS — N401 Enlarged prostate with lower urinary tract symptoms: Secondary | ICD-10-CM | POA: Diagnosis not present

## 2023-01-16 DIAGNOSIS — N138 Other obstructive and reflux uropathy: Secondary | ICD-10-CM

## 2023-01-16 MED ORDER — SILDENAFIL CITRATE 20 MG PO TABS: ORAL_TABLET | ORAL | 3 refills | Status: DC

## 2023-01-24 ENCOUNTER — Other Ambulatory Visit: Payer: Self-pay | Admitting: Acute Care

## 2023-01-24 DIAGNOSIS — Z122 Encounter for screening for malignant neoplasm of respiratory organs: Secondary | ICD-10-CM

## 2023-01-24 DIAGNOSIS — Z87891 Personal history of nicotine dependence: Secondary | ICD-10-CM

## 2023-01-24 DIAGNOSIS — F17210 Nicotine dependence, cigarettes, uncomplicated: Secondary | ICD-10-CM

## 2023-01-27 ENCOUNTER — Ambulatory Visit: Payer: 59 | Attending: Student in an Organized Health Care Education/Training Program

## 2023-02-02 ENCOUNTER — Ambulatory Visit: Payer: 59 | Attending: Family Medicine | Admitting: Physical Therapy

## 2023-02-09 ENCOUNTER — Ambulatory Visit: Payer: 59 | Admitting: Student in an Organized Health Care Education/Training Program

## 2023-02-10 ENCOUNTER — Other Ambulatory Visit: Payer: Self-pay | Admitting: Family Medicine

## 2023-02-10 NOTE — Progress Notes (Signed)
PDMP reviewed for pt

## 2023-02-23 ENCOUNTER — Ambulatory Visit: Payer: 59

## 2023-03-06 DIAGNOSIS — M25511 Pain in right shoulder: Secondary | ICD-10-CM | POA: Diagnosis not present

## 2023-03-06 DIAGNOSIS — M25512 Pain in left shoulder: Secondary | ICD-10-CM | POA: Diagnosis not present

## 2023-04-16 ENCOUNTER — Ambulatory Visit (INDEPENDENT_AMBULATORY_CARE_PROVIDER_SITE_OTHER): Payer: 59 | Admitting: Family Medicine

## 2023-04-16 ENCOUNTER — Encounter: Payer: Self-pay | Admitting: Family Medicine

## 2023-04-16 VITALS — BP 118/80 | HR 78 | Temp 97.4°F | Resp 16 | Ht 70.0 in | Wt 158.6 lb

## 2023-04-16 DIAGNOSIS — I2510 Atherosclerotic heart disease of native coronary artery without angina pectoris: Secondary | ICD-10-CM

## 2023-04-16 DIAGNOSIS — I700 Atherosclerosis of aorta: Secondary | ICD-10-CM | POA: Diagnosis not present

## 2023-04-16 DIAGNOSIS — M25531 Pain in right wrist: Secondary | ICD-10-CM

## 2023-04-16 DIAGNOSIS — M545 Low back pain, unspecified: Secondary | ICD-10-CM

## 2023-04-16 DIAGNOSIS — N183 Chronic kidney disease, stage 3 unspecified: Secondary | ICD-10-CM

## 2023-04-16 DIAGNOSIS — M79641 Pain in right hand: Secondary | ICD-10-CM

## 2023-04-16 DIAGNOSIS — F17200 Nicotine dependence, unspecified, uncomplicated: Secondary | ICD-10-CM

## 2023-04-16 DIAGNOSIS — E538 Deficiency of other specified B group vitamins: Secondary | ICD-10-CM | POA: Diagnosis not present

## 2023-04-16 DIAGNOSIS — J449 Chronic obstructive pulmonary disease, unspecified: Secondary | ICD-10-CM | POA: Diagnosis not present

## 2023-04-16 DIAGNOSIS — J439 Emphysema, unspecified: Secondary | ICD-10-CM

## 2023-04-16 DIAGNOSIS — F3342 Major depressive disorder, recurrent, in full remission: Secondary | ICD-10-CM

## 2023-04-16 DIAGNOSIS — Z23 Encounter for immunization: Secondary | ICD-10-CM

## 2023-04-16 DIAGNOSIS — F339 Major depressive disorder, recurrent, unspecified: Secondary | ICD-10-CM

## 2023-04-16 DIAGNOSIS — E782 Mixed hyperlipidemia: Secondary | ICD-10-CM

## 2023-04-16 DIAGNOSIS — M75102 Unspecified rotator cuff tear or rupture of left shoulder, not specified as traumatic: Secondary | ICD-10-CM

## 2023-04-16 DIAGNOSIS — D573 Sickle-cell trait: Secondary | ICD-10-CM

## 2023-04-16 MED ORDER — BACLOFEN 5 MG PO TABS: 5.0000 mg | ORAL_TABLET | Freq: Three times a day (TID) | ORAL | 0 refills | Status: AC | PRN

## 2023-04-16 MED ORDER — NAPROXEN 500 MG PO TABS: 500.0000 mg | ORAL_TABLET | Freq: Two times a day (BID) | ORAL | 0 refills | Status: DC | PRN

## 2023-04-16 NOTE — Progress Notes (Signed)
Name: Manuel [name]   MRN: [id number]    DOB: 1956/02/15   Date:04/16/2023       Progress Note  Chief Complaint  Patient presents with   COPD   Hyperlipidemia   Acute Visit     Subjective:   Manuel [name] is a 67 y.o. male, presents to clinic for routine f/up on chronic conditions  [facility] - they are seeing him for his right shoulder and now new bilateral hands/wrist pain - he is going to have them eval also   COPD/emphysema - per pulmonology - referred and pt getting established with needed specialists On symbicort and has used albuterol previously  Still smoking   HLD - labs done Oct - on crestor - he tolerates it, will recheck annually  Lab Results  Component Value Date   CHOL 162 08/05/2022   HDL 53 08/05/2022   LDLCALC 89 08/05/2022   TRIG 104 08/05/2022   CHOLHDL 3.1 08/05/2022   CKD ? W/o htn, not on medications  B12 and Vit D deficiency on supplements ED managed with sildenafil    Current Outpatient Medications:    budesonide-formoterol (SYMBICORT) 160-4.5 MCG/ACT inhaler, Inhale 2 puffs into the lungs 2 (two) times daily. And can use as rescue/PRN 2 puffs up to 4 times a day for wheeze or cough, max 12 puffs in 24 hours, Disp: 1 each, Rfl: 3   Cholecalciferol (VITAMIN D3 PO), Take by mouth., Disp: , Rfl:    diphenhydramine-acetaminophen (TYLENOL PM) 25-500 MG TABS tablet, Take 2 tablets by mouth at bedtime as needed (pain)., Disp: , Rfl:    rosuvastatin (CRESTOR) 20 MG tablet, Take 1 tablet (20 mg total) by mouth at bedtime., Disp: 90 tablet, Rfl: 3   sildenafil (REVATIO) 20 MG tablet, Take 3 to 5 tablets two hours before intercouse on an empty stomach.  Do not take with nitrates., Disp: 30 tablet, Rfl: 3   vitamin B-12 (CYANOCOBALAMIN) 500 MCG tablet, Take 2 tablets (1,000 mcg total) by mouth daily., Disp: 30 tablet, Rfl: 11  Patient Active Problem List   Diagnosis Date Noted   Weight loss 04/14/2022   Caregiver stress 08/15/2021   Hx of colonic  polyps    Polyp of sigmoid colon    MDD (major depressive disorder), recurrent, in full remission (HCC) 10/31/2020   Primary insomnia 10/31/2020   Chronic kidney disease, stage III (moderate) (HCC) 11/12/2018   Recurrent depression (HCC) 07/15/2018   Chronic tension-type headache, intractable 05/17/2018   Myofascial pain syndrome 05/17/2018   Chronic daily headache 04/12/2018   Coronary artery disease 01/11/2018   Aortic atherosclerosis (HCC) 01/11/2018   Pulmonary emphysema (HCC) 01/11/2018   Mass of left thigh 12/30/2017   Vitamin D deficiency 01/14/2016   Vitamin B12 deficiency 01/14/2016   Renal lesion 11/13/2015   Chronic lower back pain 11/09/2015   BPH (benign prostatic hyperplasia) 11/01/2015   Elevated PSA 11/01/2015   Hyperlipidemia 11/01/2015   Bunion, left foot 11/01/2015   Sickle cell trait (HCC) 01/16/2015   Cervical spondylosis without myelopathy 01/09/2015   Carpal tunnel syndrome 01/09/2015   Rotator cuff syndrome of left shoulder 09/18/2014   LVH (left ventricular hypertrophy) 08/18/2014   Current smoker 10/03/2013    Past Surgical History:  Procedure Laterality Date   CARPAL TUNNEL RELEASE Left    COLONOSCOPY WITH PROPOFOL N/A 12/25/2015   Procedure: COLONOSCOPY WITH PROPOFOL;  Surgeon: Midge Minium, MD;  Location: [facility];  Service: Endoscopy;  Laterality: N/A;   COLONOSCOPY WITH PROPOFOL N/A  05/02/2021   Procedure: COLONOSCOPY WITH PROPOFOL;  Surgeon: Midge Minium, MD;  Location: [facility];  Service: Endoscopy;  Laterality: N/A;   CYSTOURETHROSCOPY  08/25/12   with ureteral cathization w/wo retrograde pyelogram   HOLEP-LASER ENUCLEATION OF THE PROSTATE WITH MORCELLATION N/A 04/05/2021   Procedure: HOLEP-LASER ENUCLEATION OF THE PROSTATE WITH MORCELLATION;  Surgeon: Sondra Come, MD;  Location: [facility];  Service: Urology;  Laterality: N/A;   POLYPECTOMY N/A 05/02/2021   Procedure: POLYPECTOMY;  Surgeon: Midge Minium, MD;  Location: [facility];  Service: Endoscopy;  Laterality: N/A;   ROTATOR CUFF REPAIR Right 01/25/15   SHOULDER ARTHROSCOPY WITH SUBACROMIAL DECOMPRESSION Right 032416   TRANSURETHRAL RESECTION OF PROSTATE  08/25/12    Family History  Problem Relation Age of Onset   Aneurysm Mother    Hypertension Mother    Diabetes Father    Hypertension Father    Prostate cancer Father    Lupus Father    Cancer Maternal Aunt    Cancer Maternal Uncle    Diabetes Paternal Uncle    Heart disease Cousin    Prostate cancer Paternal Uncle    COPD Neg Hx    Stroke Neg Hx    Kidney disease Neg Hx     Social History   Tobacco Use   Smoking status: Every Day    Packs/day: 1.00    Years: 35.00    Additional pack years: 0.00    Total pack years: 35.00    Types: Cigarettes   Smokeless tobacco: Never  Vaping Use   Vaping Use: Never used  Substance Use Topics   Alcohol use: No   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     Allergies  Allergen Reactions   Penicillin G Other (See Comments)    Childhood    Health Maintenance  Topic Date Due   Pneumonia Vaccine 29+ Years old (2 of 2 - PCV) 04/13/2019   Lung Cancer Screening  02/21/2023   COVID-19 Vaccine (3 - 2023-24 season) 05/02/2023 (Originally 07/04/2022)   INFLUENZA VACCINE  06/04/2023   Medicare Annual Wellness (AWV)  06/20/2023   DTaP/Tdap/Td (2 - Tdap) 11/04/2023   Colonoscopy  05/02/2024   Hepatitis C Screening  Completed   Zoster Vaccines- Shingrix  Completed   HIV Screening  Addressed   HPV VACCINES  Aged Out    Chart Review Today: I personally reviewed active problem list, medication list, allergies, family history, social history, health maintenance, notes from last encounter, lab results, imaging with the patient/caregiver today.   Review of Systems  Constitutional: Negative.   HENT: Negative.    Eyes: Negative.   Respiratory: Negative.    Cardiovascular: Negative.   Gastrointestinal: Negative.   Endocrine: Negative.    Genitourinary: Negative.   Musculoskeletal: Negative.   Skin: Negative.   Allergic/Immunologic: Negative.   Neurological: Negative.   Hematological: Negative.   Psychiatric/Behavioral: Negative.    All other systems reviewed and are negative.    Objective:   Vitals:   04/16/23 1102  BP: 118/80  Pulse: 78  Resp: 16  Temp: (!) 97.4 F (36.3 C)  TempSrc: Oral  SpO2: 99%  Weight: 158 lb 9.6 oz (71.9 kg)  Height: 5\' 10"  (1.778 m)    Body mass index is 22.76 kg/m.  Physical Exam Vitals and nursing note reviewed.  Constitutional:      General: He is not in acute distress.    Appearance: Normal appearance. He is well-developed.  He is not ill-appearing, toxic-appearing or diaphoretic.  HENT:     Head: Normocephalic and atraumatic.     Right Ear: External ear normal.     Left Ear: External ear normal.     Nose: Nose normal.  Eyes:     General:        Right eye: No discharge.        Left eye: No discharge.     Conjunctiva/sclera: Conjunctivae normal.  Neck:     Trachea: No tracheal deviation.  Cardiovascular:     Rate and Rhythm: Normal rate and regular rhythm.     Pulses: Normal pulses.     Heart sounds: Normal heart sounds.  Pulmonary:     Effort: Pulmonary effort is normal. No respiratory distress.     Breath sounds: Normal breath sounds. No stridor. No wheezing.  Abdominal:     General: Bowel sounds are normal.     Palpations: Abdomen is soft.  Skin:    General: Skin is warm and dry.     Findings: No rash.  Neurological:     Mental Status: He is alert.     Motor: No abnormal muscle tone.     Coordination: Coordination normal.  Psychiatric:        Mood and Affect: Mood normal.        Behavior: Behavior normal.         Assessment & Plan:   Problem List Items Addressed This Visit       Cardiovascular and Mediastinum   Coronary artery disease (Chronic)    Asx, continue risk modification, advised to stop or cut back on smoking, on statin       Aortic atherosclerosis (HCC) (Chronic)    On statin, tolerating, monitoring        Respiratory   Pulmonary emphysema (HCC)    Getting reestablished with pulmonology pt gradually having more respiratory sx Currently on symbicort        Musculoskeletal and Integument   Rotator cuff syndrome of left shoulder    Per ortho/PT        Genitourinary   Chronic kidney disease, stage III (moderate) (HCC)    Recheck labs, BP in normal range, no DM      Relevant Orders   COMPLETE METABOLIC PANEL WITH GFR (Completed)     Other   Sickle cell trait (HCC) (Chronic)   Hyperlipidemia (Chronic)    labs done Oct - on crestor - he tolerates it, will recheck annually  Lab Results  Component Value Date   CHOL 162 08/05/2022   HDL 53 08/05/2022   LDLCALC 89 08/05/2022   TRIG 104 08/05/2022   CHOLHDL 3.1 08/05/2022       Relevant Orders   COMPLETE METABOLIC PANEL WITH GFR (Completed)   Current smoker    Smoking cessation - Smoking cessation instruction/counseling given:  counseled patient on the dangers of tobacco use, advised patient to stop smoking, and reviewed strategies to maximize success       Vitamin B12 deficiency    On B12 supplement      Relevant Orders   CBC with Differential/Platelet (Completed)   Recurrent depression (HCC)    Not on medications, caring for Marisue Ivan and working is getting more and more stressful for pt, he has not done his own medical care Phq neg    04/16/2023   10:55 AM 08/05/2022    8:46 AM 07/15/2022   10:06 AM  Depression screen PHQ 2/9  Decreased Interest 0  0 0  Down, Depressed, Hopeless 0 0 0  PHQ - 2 Score 0 0 0  Altered sleeping 0 0 0  Tired, decreased energy 0 0 0  Change in appetite 0 0 0  Feeling bad or failure about yourself  0 0 0  Trouble concentrating 0 0 0  Moving slowly or fidgety/restless 0 0 0  Suicidal thoughts 0 0 0  PHQ-9 Score 0 0 0  Difficult doing work/chores Not difficult at all Not difficult at all Not difficult at all         RESOLVED: MDD (major depressive disorder), recurrent, in full remission (HCC)   Other Visit Diagnoses     Chronic obstructive pulmonary disease, unspecified COPD type (HCC)    -  Primary   Bilateral hand pain       new pt would like ortho to consult on this as well as his shoulder   Relevant Orders   Ambulatory referral to Orthopedics   Pain in both wrists       would like to be checked for carpal tunnel by ortho who he is seeing for shoulder   Relevant Orders   Ambulatory referral to Orthopedics   Acute right-sided low back pain without sciatica       acute on chronic - I have referred him to PT, PM&R, and ortho, refill on meds   Relevant Medications   Baclofen 5 MG TABS   naproxen (NAPROSYN) 500 MG tablet   Need for pneumococcal vaccination       Relevant Orders   Pneumococcal conjugate vaccine 20-valent (Completed)        Return for [facility] in Aug, and routine f/up 4 months.   Danelle Berry, PA-C 04/16/23 11:09 AM

## 2023-04-17 LAB — CBC WITH DIFFERENTIAL/PLATELET
Absolute Monocytes: 371 cells/uL (ref 200–950)
Basophils Absolute: 21 cells/uL (ref 0–200)
Basophils Relative: 0.4 %
Eosinophils Absolute: 80 cells/uL (ref 15–500)
Eosinophils Relative: 1.5 %
HCT: 43.8 % (ref 38.5–50.0)
Hemoglobin: 14.2 g/dL (ref 13.2–17.1)
Lymphs Abs: 1622 cells/uL (ref 850–3900)
MCH: 25.1 pg — ABNORMAL LOW (ref 27.0–33.0)
MCHC: 32.4 g/dL (ref 32.0–36.0)
MCV: 77.4 fL — ABNORMAL LOW (ref 80.0–100.0)
MPV: 9.6 fL (ref 7.5–12.5)
Monocytes Relative: 7 %
Neutro Abs: 3207 cells/uL (ref 1500–7800)
Neutrophils Relative %: 60.5 %
Platelets: 258 10*3/uL (ref 140–400)
RBC: 5.66 10*6/uL (ref 4.20–5.80)
RDW: 14.8 % (ref 11.0–15.0)
Total Lymphocyte: 30.6 %
WBC: 5.3 10*3/uL (ref 3.8–10.8)

## 2023-04-17 LAB — COMPLETE METABOLIC PANEL WITH GFR
AG Ratio: 1.4 (calc) (ref 1.0–2.5)
ALT: 10 U/L (ref 9–46)
AST: 17 U/L (ref 10–35)
Albumin: 4 g/dL (ref 3.6–5.1)
Alkaline phosphatase (APISO): 60 U/L (ref 35–144)
BUN/Creatinine Ratio: 8 (calc) (ref 6–22)
BUN: 12 mg/dL (ref 7–25)
CO2: 28 mmol/L (ref 20–32)
Calcium: 9.2 mg/dL (ref 8.6–10.3)
Chloride: 105 mmol/L (ref 98–110)
Creat: 1.47 mg/dL — ABNORMAL HIGH (ref 0.70–1.35)
Globulin: 2.8 g/dL (calc) (ref 1.9–3.7)
Glucose, Bld: 63 mg/dL — ABNORMAL LOW (ref 65–99)
Potassium: 4.2 mmol/L (ref 3.5–5.3)
Sodium: 141 mmol/L (ref 135–146)
Total Bilirubin: 0.3 mg/dL (ref 0.2–1.2)
Total Protein: 6.8 g/dL (ref 6.1–8.1)
eGFR: 53 mL/min/{1.73_m2} — ABNORMAL LOW (ref >=60)

## 2023-04-23 ENCOUNTER — Encounter: Payer: Self-pay | Admitting: Family Medicine

## 2023-04-23 NOTE — Assessment & Plan Note (Signed)
Smoking cessation - Smoking cessation instruction/counseling given:  counseled patient on the dangers of tobacco use, advised patient to stop smoking, and reviewed strategies to maximize success

## 2023-04-23 NOTE — Assessment & Plan Note (Signed)
On statin, tolerating, monitoring

## 2023-04-23 NOTE — Assessment & Plan Note (Signed)
labs done Oct - on crestor - he tolerates it, will recheck annually  Lab Results  Component Value Date   CHOL 162 08/05/2022   HDL 53 08/05/2022   LDLCALC 89 08/05/2022   TRIG 104 08/05/2022   CHOLHDL 3.1 08/05/2022

## 2023-04-23 NOTE — Assessment & Plan Note (Signed)
Getting reestablished with pulmonology pt gradually having more respiratory sx Currently on symbicort

## 2023-04-23 NOTE — Assessment & Plan Note (Signed)
Asx, continue risk modification, advised to stop or cut back on smoking, on statin

## 2023-04-23 NOTE — Assessment & Plan Note (Signed)
Not on medications, caring for Manuel [name] and working is getting more and more stressful for pt, he has not done his own medical care Phq neg    04/16/2023   10:55 AM 08/05/2022    8:46 AM 07/15/2022   10:06 AM  Depression screen PHQ 2/9  Decreased Interest 0 0 0  Down, Depressed, Hopeless 0 0 0  PHQ - 2 Score 0 0 0  Altered sleeping 0 0 0  Tired, decreased energy 0 0 0  Change in appetite 0 0 0  Feeling bad or failure about yourself  0 0 0  Trouble concentrating 0 0 0  Moving slowly or fidgety/restless 0 0 0  Suicidal thoughts 0 0 0  PHQ-9 Score 0 0 0  Difficult doing work/chores Not difficult at all Not difficult at all Not difficult at all

## 2023-04-23 NOTE — Assessment & Plan Note (Signed)
Recheck labs, BP in normal range, no DM

## 2023-04-23 NOTE — Assessment & Plan Note (Signed)
On B12 supplement 

## 2023-04-23 NOTE — Assessment & Plan Note (Signed)
Per ortho/PT

## 2023-05-19 ENCOUNTER — Ambulatory Visit: Payer: 59

## 2023-05-22 ENCOUNTER — Ambulatory Visit: Payer: 59 | Attending: Acute Care

## 2023-06-02 ENCOUNTER — Ambulatory Visit
Admission: RE | Admit: 2023-06-02 | Discharge: 2023-06-02 | Disposition: A | Discharge status: Home / Self Care | Payer: 59 | Source: Ambulatory Visit | Attending: Acute Care | Admitting: Acute Care

## 2023-06-02 DIAGNOSIS — Z122 Encounter for screening for malignant neoplasm of respiratory organs: Secondary | ICD-10-CM | POA: Insufficient documentation

## 2023-06-02 DIAGNOSIS — Z87891 Personal history of nicotine dependence: Secondary | ICD-10-CM | POA: Diagnosis not present

## 2023-06-02 DIAGNOSIS — F17210 Nicotine dependence, cigarettes, uncomplicated: Secondary | ICD-10-CM | POA: Insufficient documentation

## 2023-06-05 ENCOUNTER — Ambulatory Visit (INDEPENDENT_AMBULATORY_CARE_PROVIDER_SITE_OTHER): Payer: 59

## 2023-06-05 VITALS — BP 130/82 | Ht 70.0 in | Wt 157.6 lb

## 2023-06-05 DIAGNOSIS — Z0000 Encounter for general adult medical examination without abnormal findings: Secondary | ICD-10-CM | POA: Diagnosis not present

## 2023-06-05 DIAGNOSIS — Z122 Encounter for screening for malignant neoplasm of respiratory organs: Secondary | ICD-10-CM

## 2023-06-05 NOTE — Progress Notes (Cosign Needed Addendum)
Subjective:   Manuel [name] is a 67 y.o. male who presents for Medicare Annual/Subsequent preventive examination.  Visit Complete: In person  Patient Medicare AWV questionnaire was completed by the patient on (not done); I have confirmed that all information answered by patient is correct and no changes since this date.  Review of Systems    Cardiac Risk Factors include: advanced age (>66men, >71 women);dyslipidemia;male gender;sedentary lifestyle;smoking/ tobacco exposure    Objective:    Today's Vitals   06/05/23 1133 06/05/23 1134  Weight: 157 lb 9.6 oz (71.5 kg)   Height: 5\' 10"  (1.778 m)   PainSc:  4    Body mass index is 22.61 kg/m.     06/05/2023   11:44 AM 11/11/2022   11:42 AM 06/29/2021    8:51 AM 06/18/2021    9:42 AM 05/02/2021    8:19 AM 04/05/2021    6:22 AM 04/03/2021   10:02 AM  Advanced Directives  Does Patient Have a Medical Advance Directive? Yes No No No No No No  Type of Estate agent of [place];Living will        Would patient like information on creating a medical advance directive?  No - Patient declined  No - Patient declined No - Patient declined No - Patient declined No - Patient declined    Current Medications (verified) Outpatient Encounter Medications as of 06/05/2023  Medication Sig   Baclofen 5 MG TABS Take 1-2 tablets (5-10 mg total) by mouth 3 (three) times daily as needed (msk pain or spasms).   budesonide-formoterol (SYMBICORT) 160-4.5 MCG/ACT inhaler Inhale 2 puffs into the lungs 2 (two) times daily. And can use as rescue/PRN 2 puffs up to 4 times a day for wheeze or cough, max 12 puffs in 24 hours   Cholecalciferol (VITAMIN D3 PO) Take by mouth.   diphenhydramine-acetaminophen (TYLENOL PM) 25-500 MG TABS tablet Take 2 tablets by mouth at bedtime as needed (pain).   naproxen (NAPROSYN) 500 MG tablet Take 1 tablet (500 mg total) by mouth 2 (two) times daily as needed for moderate pain.   rosuvastatin (CRESTOR) 20 MG tablet  Take 1 tablet (20 mg total) by mouth at bedtime.   sildenafil (REVATIO) 20 MG tablet Take 3 to 5 tablets two hours before intercouse on an empty stomach.  Do not take with nitrates.   vitamin B-12 (CYANOCOBALAMIN) 500 MCG tablet Take 2 tablets (1,000 mcg total) by mouth daily.   No facility-administered encounter medications on file as of 06/05/2023.    Allergies (verified) Penicillin g   History: Past Medical History:  Diagnosis Date   Anemia    Anxiety    Aortic atherosclerosis (HCC) 01/11/2018   CT scan Feb 2019   Asthma    Bilateral carpal tunnel syndrome    Bilateral hand numbness    BPH without urinary obstruction    Cervical spondylosis without myelopathy    Chronic kidney disease    STAGE 3   Complete tear of rotator cuff    bilateral   COPD (chronic obstructive pulmonary disease) (HCC)    Depression    Emphysema lung (HCC) 01/11/2018   Chest CT Feb 2019   History of stomach ulcers    HLD (hyperlipidemia)    LVH (left ventricular hypertrophy)    Personal history of tobacco use, presenting hazards to health 12/21/2015   Poor dentition    Sickle cell trait (HCC)    Sleep apnea    Smoker    Past Surgical  History:  Procedure Laterality Date   CARPAL TUNNEL RELEASE Left    COLONOSCOPY WITH PROPOFOL N/A 12/25/2015   Procedure: COLONOSCOPY WITH PROPOFOL;  Surgeon: Midge Minium, MD;  Location: [facility];  Service: Endoscopy;  Laterality: N/A;   COLONOSCOPY WITH PROPOFOL N/A 05/02/2021   Procedure: COLONOSCOPY WITH PROPOFOL;  Surgeon: Midge Minium, MD;  Location: [facility];  Service: Endoscopy;  Laterality: N/A;   CYSTOURETHROSCOPY  08/25/12   with ureteral cathization w/wo retrograde pyelogram   HOLEP-LASER ENUCLEATION OF THE PROSTATE WITH MORCELLATION N/A 04/05/2021   Procedure: HOLEP-LASER ENUCLEATION OF THE PROSTATE WITH MORCELLATION;  Surgeon: Sondra Come, MD;  Location: [facility];  Service: Urology;  Laterality: N/A;   POLYPECTOMY N/A 05/02/2021    Procedure: POLYPECTOMY;  Surgeon: Midge Minium, MD;  Location: [facility];  Service: Endoscopy;  Laterality: N/A;   ROTATOR CUFF REPAIR Right 01/25/15   SHOULDER ARTHROSCOPY WITH SUBACROMIAL DECOMPRESSION Right [id number]   TRANSURETHRAL RESECTION OF PROSTATE  08/25/12   Family History  Problem Relation Age of Onset   Aneurysm Mother    Hypertension Mother    Diabetes Father    Hypertension Father    Prostate cancer Father    Lupus Father    Cancer Maternal Aunt    Cancer Maternal Uncle    Diabetes Paternal Uncle    Heart disease Cousin    Prostate cancer Paternal Uncle    COPD Neg Hx    Stroke Neg Hx    Kidney disease Neg Hx    Social History   Socioeconomic History   Marital status: Married    Spouse name: Not on file   Number of children: Not on file   Years of education: Not on file   Highest education level: Not on file  Occupational History   Not on file  Tobacco Use   Smoking status: Every Day    Current packs/day: 1.00    Average packs/day: 1 pack/day for 35.0 years (35.0 ttl pk-yrs)    Types: Cigarettes   Smokeless tobacco: Never  Vaping Use   Vaping status: Never Used  Substance and Sexual Activity   Alcohol use: No   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   Sexual activity: Yes  Other Topics Concern   Not on file  Social History Narrative   Not on file   Social Determinants of Health   Financial Resource Strain: Low Risk  (06/05/2023)   Overall Financial Resource Strain (CARDIA)    Difficulty of Paying Living Expenses: Not hard at all  Food Insecurity: No Food Insecurity (06/05/2023)   Hunger Vital Sign    Worried About Running Out of Food in the Last Year: Never true    Ran Out of Food in the Last Year: Never true  Transportation Needs: No Transportation Needs (06/05/2023)   PRAPARE - Administrator, Civil Service (Medical): No    Lack of Transportation (Non-Medical): No  Physical Activity: Inactive (06/05/2023)   Exercise  Vital Sign    Days of Exercise per Week: 0 days    Minutes of Exercise per Session: 0 min  Stress: Stress Concern Present (06/05/2023)   [employer] of Occupational Health - Occupational Stress Questionnaire    Feeling of Stress : To some extent  Social Connections: Moderately Isolated (06/05/2023)   Social Connection and Isolation Panel [NHANES]    Frequency of Communication with Friends and Family: More than three times a week  Frequency of Social Gatherings with Friends and Family: Three times a week    Attends Religious Services: Never    Active Member of Clubs or Organizations: No    Attends Banker Meetings: Never    Marital Status: Married    Tobacco Counseling Ready to quit: Not Answered Counseling given: Not Answered   Clinical Intake:  Pre-visit preparation completed: Yes  Pain : 0-10 Pain Score: 4  Pain Type: Chronic pain Pain Location: Shoulder Pain Descriptors / Indicators: Aching, Tingling (tingling of hands) Pain Onset: More than a month ago Pain Frequency: Intermittent     BMI - recorded: 22.61 Nutritional Status: BMI of 19-24  Normal Nutritional Risks: None Diabetes: No  How often do you need to have someone help you when you read instructions, pamphlets, or other written materials from your doctor or pharmacy?: 1 - Never  Interpreter Needed?: No  Comments: lives with wife Information entered by :: B.,LPN   Activities of Daily Living    06/05/2023   11:44 AM 04/16/2023   10:55 AM  In your present state of health, do you have any difficulty performing the following activities:  Hearing? 0 0  Vision? 0 0  Difficulty concentrating or making decisions? 0 0  Walking or climbing stairs? 1 0  Dressing or bathing? 0 0  Doing errands, shopping? 0 0  Preparing Food and eating ? N   Using the Toilet? N   In the past six months, have you accidently leaked urine? N   Do you have problems with loss of bowel control? N   Managing  your Medications? N   Managing your Finances? N   Housekeeping or managing your Housekeeping? N     Patient Care Team: Danelle Berry, PA-C as PCP - General (Family Medicine) Midge Minium, MD as Consulting Physician (Gastroenterology) Hulan Fray as Physician Assistant (Urology) Dedra Skeens, PA-C (Orthopedic Surgery) Lonell Face, MD as Consulting Physician (Neurology) Geanie Logan, MD as Referring Physician (Otolaryngology)  Indicate any recent Medical Services you may have received from other than [facility] providers in the past year (date may be approximate).     Assessment:   This is a routine wellness examination for Manuel [name].  Hearing/Vision screen Hearing Screening - Comments:: Adequate hearing Vision Screening - Comments:: Adequate vision [facility]  Dietary issues and exercise activities discussed:     Goals Addressed             This Visit's Progress    DIET - INCREASE WATER INTAKE   Not on track    Recommend drinking 6-8 glasses of water per day        Depression Screen    06/05/2023   11:42 AM 04/16/2023   10:55 AM 08/05/2022    8:46 AM 07/15/2022   10:06 AM 04/14/2022   10:13 AM 12/17/2021   10:15 AM 11/26/2021    8:27 AM  PHQ 2/9 Scores  PHQ - 2 Score 0 0 0 0 0 0 0  PHQ- 9 Score  0 0 0  0 0    Fall Risk    06/05/2023   11:36 AM 04/16/2023   10:55 AM 08/05/2022    8:46 AM 07/15/2022   10:05 AM 04/14/2022   10:13 AM  Fall Risk   Falls in the past year? 0 0 0 0 0  Number falls in past yr: 0 0 0 0 0  Injury with Fall? 0 0 0 0 0  Risk for fall due  to : No Fall Risks No Fall Risks No Fall Risks No Fall Risks   Follow up Education provided;Falls prevention discussed Falls prevention discussed;Education provided;Falls evaluation completed Falls prevention discussed;Education provided Falls prevention discussed;Education provided Falls evaluation completed    MEDICARE RISK AT HOME:  Medicare Risk at Home - 06/05/23 1136     Any stairs in or  around the home? Yes    If so, are there any without handrails? Yes    Home free of loose throw rugs in walkways, pet beds, electrical cords, etc? Yes    Adequate lighting in your home to reduce risk of falls? Yes    Life alert? No    Use of a cane, walker or w/c? No    Grab bars in the bathroom? Yes    Shower chair or bench in shower? Yes    Elevated toilet seat or a handicapped toilet? No             TIMED UP AND GO:  Was the test performed?  Yes  Length of time to ambulate 10 feet: 12 sec Gait steady and fast without use of assistive device    Cognitive Function:        06/05/2023   11:46 AM  6CIT Screen  What Year? 0 points  What month? 0 points  What time? 0 points  Count back from 20 0 points  Months in reverse 0 points  Repeat phrase 0 points  Total Score 0 points    Immunizations Immunization History  Administered Date(s) Administered   Influenza,inj,Quad PF,6+ Mos 11/01/2015, 10/31/2016, 08/24/2017, 07/15/2018, 08/26/2019, 10/04/2020, 08/15/2021, 07/15/2022   PFIZER(Purple Top)SARS-COV-2 Vaccination 04/04/2020, 03/14/2021   PNEUMOCOCCAL CONJUGATE-20 04/16/2023   Pneumococcal Polysaccharide-23 04/12/2018   Td 11/03/2013   Zoster Recombinant(Shingrix) 12/17/2021, 07/15/2022    TDAP status: Up to date  Flu Vaccine status: Up to date  Pneumococcal vaccine status: Up to date  Covid-19 vaccine status: Completed vaccines  Qualifies for Shingles Vaccine? Yes   Zostavax completed Yes   Shingrix Completed?: Yes  Screening Tests Health Maintenance  Topic Date Due   COVID-19 Vaccine (3 - 2023-24 season) 07/04/2022   Lung Cancer Screening  02/21/2023   INFLUENZA VACCINE  06/04/2023   DTaP/Tdap/Td (2 - Tdap) 11/04/2023   Colonoscopy  05/02/2024   Medicare Annual Wellness (AWV)  06/04/2024   Pneumonia Vaccine 35+ Years old  Completed   Hepatitis C Screening  Completed   Zoster Vaccines- Shingrix  Completed   HIV Screening  Addressed   HPV VACCINES   Aged Out    Health Maintenance  Health Maintenance Due  Topic Date Due   COVID-19 Vaccine (3 - 2023-24 season) 07/04/2022   Lung Cancer Screening  02/21/2023   INFLUENZA VACCINE  06/04/2023    Colorectal cancer screening: Type of screening: Colonoscopy. Completed yes. Repeat every 5-10 years  Lung Cancer Screening: (Low Dose CT Chest recommended if Age 54-80 years, 20 pack-year currently smoking OR have quit w/in 15years.) does qualify.   Lung Cancer Screening Referral: yes  Additional Screening:  Hepatitis C Screening: does qualify; Completed yes  Vision Screening: Recommended annual ophthalmology exams for early detection of glaucoma and other disorders of the eye. Is the patient up to date with their annual eye exam?  Yes  Who is the provider or what is the name of the office in which the patient attends annual eye exams? [facility] If pt is not established with a provider, would they like to be referred to  a provider to establish care? No .   Dental Screening: Recommended annual dental exams for proper oral hygiene  Diabetic Foot Exam: n/a  Community Resource Referral / Chronic Care Management: CRR required this visit?  No   CCM required this visit?  No    Plan:     I have personally reviewed and noted the following in the patient's chart:   Medical and social history Use of alcohol, tobacco or illicit drugs  Current medications and supplements including opioid prescriptions. Patient is not currently taking opioid prescriptions. Functional ability and status Nutritional status Physical activity Advanced directives List of other physicians Hospitalizations, surgeries, and ER visits in previous 12 months Vitals Screenings to include cognitive, depression, and falls Referrals and appointments  In addition, I have reviewed and discussed with patient certain preventive protocols, quality metrics, and best practice recommendations. A written personalized care  plan for preventive services as well as general preventive health recommendations were provided to patient.    Sue Lush, LPN   07/08/2840   After Visit Summary: PRINTED AND GIVEN TO PT  Nurse Notes: The patient states he is doing alright. He has no concerns or questions at this time. He does request lung cancer screening and says he has had heart problems with men in his family and desires to "get ahead of it and be checked".  *Referral to Pulmonology made

## 2023-06-05 NOTE — Patient Instructions (Signed)
Manuel [name] , Thank you for taking time to come for your Medicare Wellness Visit. I appreciate your ongoing commitment to your health goals. Please review the following plan we discussed and let me know if I can assist you in the future.   Referrals/Orders/Follow-Ups/Clinician Recommendations: lung cancer screening referral  This is a list of the screening recommended for you and due dates:  Health Maintenance  Topic Date Due   COVID-19 Vaccine (3 - 2023-24 season) 07/04/2022   Screening for Lung Cancer  02/21/2023   Flu Shot  06/04/2023   DTaP/Tdap/Td vaccine (2 - Tdap) 11/04/2023   Colon Cancer Screening  05/02/2024   Medicare Annual Wellness Visit  06/04/2024   Pneumonia Vaccine  Completed   Hepatitis C Screening  Completed   Zoster (Shingles) Vaccine  Completed   HIV Screening  Addressed   HPV Vaccine  Aged Out    Advanced directives: (Copy Requested) Please bring a copy of your health care power of attorney and living will to the office to be added to your chart at your convenience.  Next Medicare Annual Wellness Visit scheduled for next year: Yes 06/10/24 @ 11:15am telephone  Preventive Care 65 Years and Older, Male  Preventive care refers to lifestyle choices and visits with your health care provider that can promote health and wellness. What does preventive care include? A yearly physical exam. This is also called an annual well check. Dental exams once or twice a year. Routine eye exams. Ask your health care provider how often you should have your eyes checked. Personal lifestyle choices, including: Daily care of your teeth and gums. Regular physical activity. Eating a healthy diet. Avoiding tobacco and drug use. Limiting alcohol use. Practicing safe sex. Taking low doses of aspirin every day. Taking vitamin and mineral supplements as recommended by your health care provider. What happens during an annual well check? The services and screenings done by your health care  provider during your annual well check will depend on your age, overall health, lifestyle risk factors, and family history of disease. Counseling  Your health care provider may ask you questions about your: Alcohol use. Tobacco use. Drug use. Emotional well-being. Home and relationship well-being. Sexual activity. Eating habits. History of falls. Memory and ability to understand (cognition). Work and work Astronomer. Screening  You may have the following tests or measurements: Height, weight, and BMI. Blood pressure. Lipid and cholesterol levels. These may be checked every 5 years, or more frequently if you are over 59 years old. Skin check. Lung cancer screening. You may have this screening every year starting at age 82 if you have a 30-pack-year history of smoking and currently smoke or have quit within the past 15 years. Fecal occult blood test (FOBT) of the stool. You may have this test every year starting at age 63. Flexible sigmoidoscopy or colonoscopy. You may have a sigmoidoscopy every 5 years or a colonoscopy every 10 years starting at age 32. Prostate cancer screening. Recommendations will vary depending on your family history and other risks. Hepatitis C blood test. Hepatitis B blood test. Sexually transmitted disease (STD) testing. Diabetes screening. This is done by checking your blood sugar (glucose) after you have not eaten for a while (fasting). You may have this done every 1-3 years. Abdominal aortic aneurysm (AAA) screening. You may need this if you are a current or former smoker. Osteoporosis. You may be screened starting at age 78 if you are at high risk. Talk with your health care provider  about your test results, treatment options, and if necessary, the need for more tests. Vaccines  Your health care provider may recommend certain vaccines, such as: Influenza vaccine. This is recommended every year. Tetanus, diphtheria, and acellular pertussis (Tdap, Td)  vaccine. You may need a Td booster every 10 years. Zoster vaccine. You may need this after age 51. Pneumococcal 13-valent conjugate (PCV13) vaccine. One dose is recommended after age 52. Pneumococcal polysaccharide (PPSV23) vaccine. One dose is recommended after age 27. Talk to your health care provider about which screenings and vaccines you need and how often you need them. This information is not intended to replace advice given to you by your health care provider. Make sure you discuss any questions you have with your health care provider. Document Released: 11/16/2015 Document Revised: 07/09/2016 Document Reviewed: 08/21/2015 Elsevier Interactive Patient Education  2017 [employer].  Fall Prevention in the Home Falls can cause injuries. They can happen to people of all ages. There are many things you can do to make your home safe and to help prevent falls. What can I do on the outside of my home? Regularly fix the edges of walkways and driveways and fix any cracks. Remove anything that might make you trip as you walk through a door, such as a raised step or threshold. Trim any bushes or trees on the path to your home. Use bright outdoor lighting. Clear any walking paths of anything that might make someone trip, such as rocks or tools. Regularly check to see if handrails are loose or broken. Make sure that both sides of any steps have handrails. Any raised decks and porches should have guardrails on the edges. Have any leaves, snow, or ice cleared regularly. Use sand or salt on walking paths during winter. Clean up any spills in your garage right away. This includes oil or grease spills. What can I do in the bathroom? Use night lights. Install grab bars by the toilet and in the tub and shower. Do not use towel bars as grab bars. Use non-skid mats or decals in the tub or shower. If you need to sit down in the shower, use a plastic, non-slip stool. Keep the floor dry. Clean up any  water that spills on the floor as soon as it happens. Remove soap buildup in the tub or shower regularly. Attach bath mats securely with double-sided non-slip rug tape. Do not have throw rugs and other things on the floor that can make you trip. What can I do in the bedroom? Use night lights. Make sure that you have a light by your bed that is easy to reach. Do not use any sheets or blankets that are too big for your bed. They should not hang down onto the floor. Have a firm chair that has side arms. You can use this for support while you get dressed. Do not have throw rugs and other things on the floor that can make you trip. What can I do in the kitchen? Clean up any spills right away. Avoid walking on wet floors. Keep items that you use a lot in easy-to-reach places. If you need to reach something above you, use a strong step stool that has a grab bar. Keep electrical cords out of the way. Do not use floor polish or wax that makes floors slippery. If you must use wax, use non-skid floor wax. Do not have throw rugs and other things on the floor that can make you trip. What can I do  with my stairs? Do not leave any items on the stairs. Make sure that there are handrails on both sides of the stairs and use them. Fix handrails that are broken or loose. Make sure that handrails are as long as the stairways. Check any carpeting to make sure that it is firmly attached to the stairs. Fix any carpet that is loose or worn. Avoid having throw rugs at the top or bottom of the stairs. If you do have throw rugs, attach them to the floor with carpet tape. Make sure that you have a light switch at the top of the stairs and the bottom of the stairs. If you do not have them, ask someone to add them for you. What else can I do to help prevent falls? Wear shoes that: Do not have high heels. Have rubber bottoms. Are comfortable and fit you well. Are closed at the toe. Do not wear sandals. If you use a  stepladder: Make sure that it is fully opened. Do not climb a closed stepladder. Make sure that both sides of the stepladder are locked into place. Ask someone to hold it for you, if possible. Clearly mark and make sure that you can see: Any grab bars or handrails. First and last steps. Where the edge of each step is. Use tools that help you move around (mobility aids) if they are needed. These include: Canes. Walkers. Scooters. Crutches. Turn on the lights when you go into a dark area. Replace any light bulbs as soon as they burn out. Set up your furniture so you have a clear path. Avoid moving your furniture around. If any of your floors are uneven, fix them. If there are any pets around you, be aware of where they are. Review your medicines with your doctor. Some medicines can make you feel dizzy. This can increase your chance of falling. Ask your doctor what other things that you can do to help prevent falls. This information is not intended to replace advice given to you by your health care provider. Make sure you discuss any questions you have with your health care provider. Document Released: 08/16/2009 Document Revised: 03/27/2016 Document Reviewed: 11/24/2014 Elsevier Interactive Patient Education  2017 [employer].

## 2023-06-10 ENCOUNTER — Other Ambulatory Visit: Payer: Self-pay | Admitting: Acute Care

## 2023-06-10 DIAGNOSIS — Z122 Encounter for screening for malignant neoplasm of respiratory organs: Secondary | ICD-10-CM

## 2023-06-10 DIAGNOSIS — F17210 Nicotine dependence, cigarettes, uncomplicated: Secondary | ICD-10-CM

## 2023-06-10 DIAGNOSIS — Z87891 Personal history of nicotine dependence: Secondary | ICD-10-CM

## 2023-06-15 ENCOUNTER — Other Ambulatory Visit: Payer: Self-pay | Admitting: Family Medicine

## 2023-06-15 DIAGNOSIS — M5416 Radiculopathy, lumbar region: Secondary | ICD-10-CM

## 2023-06-15 DIAGNOSIS — M5412 Radiculopathy, cervical region: Secondary | ICD-10-CM

## 2023-06-23 DIAGNOSIS — I129 Hypertensive chronic kidney disease with stage 1 through stage 4 chronic kidney disease, or unspecified chronic kidney disease: Secondary | ICD-10-CM | POA: Diagnosis not present

## 2023-06-23 DIAGNOSIS — N1831 Chronic kidney disease, stage 3a: Secondary | ICD-10-CM | POA: Diagnosis not present

## 2023-06-23 DIAGNOSIS — R319 Hematuria, unspecified: Secondary | ICD-10-CM | POA: Diagnosis not present

## 2023-06-23 DIAGNOSIS — Z720 Tobacco use: Secondary | ICD-10-CM | POA: Diagnosis not present

## 2023-06-26 DIAGNOSIS — M19012 Primary osteoarthritis, left shoulder: Secondary | ICD-10-CM | POA: Diagnosis not present

## 2023-07-08 ENCOUNTER — Other Ambulatory Visit: Payer: 59

## 2023-07-09 ENCOUNTER — Ambulatory Visit
Admission: RE | Admit: 2023-07-09 | Discharge: 2023-07-09 | Disposition: A | Discharge status: Home / Self Care | Payer: 59 | Source: Ambulatory Visit | Attending: Family Medicine | Admitting: Family Medicine

## 2023-07-09 DIAGNOSIS — M5416 Radiculopathy, lumbar region: Secondary | ICD-10-CM

## 2023-07-09 DIAGNOSIS — M5136 Other intervertebral disc degeneration, lumbar region: Secondary | ICD-10-CM | POA: Diagnosis not present

## 2023-07-09 DIAGNOSIS — M47816 Spondylosis without myelopathy or radiculopathy, lumbar region: Secondary | ICD-10-CM | POA: Diagnosis not present

## 2023-07-09 DIAGNOSIS — M4316 Spondylolisthesis, lumbar region: Secondary | ICD-10-CM | POA: Diagnosis not present

## 2023-07-09 DIAGNOSIS — M47812 Spondylosis without myelopathy or radiculopathy, cervical region: Secondary | ICD-10-CM | POA: Diagnosis not present

## 2023-07-09 DIAGNOSIS — M5412 Radiculopathy, cervical region: Secondary | ICD-10-CM

## 2023-07-09 DIAGNOSIS — M5021 Other cervical disc displacement,  high cervical region: Secondary | ICD-10-CM | POA: Diagnosis not present

## 2023-07-09 DIAGNOSIS — M48061 Spinal stenosis, lumbar region without neurogenic claudication: Secondary | ICD-10-CM | POA: Diagnosis not present

## 2023-07-09 DIAGNOSIS — M5126 Other intervertebral disc displacement, lumbar region: Secondary | ICD-10-CM | POA: Diagnosis not present

## 2023-07-31 DIAGNOSIS — M5416 Radiculopathy, lumbar region: Secondary | ICD-10-CM | POA: Diagnosis not present

## 2023-07-31 DIAGNOSIS — M5412 Radiculopathy, cervical region: Secondary | ICD-10-CM | POA: Diagnosis not present

## 2023-07-31 DIAGNOSIS — M48062 Spinal stenosis, lumbar region with neurogenic claudication: Secondary | ICD-10-CM | POA: Diagnosis not present

## 2023-07-31 DIAGNOSIS — M5136 Other intervertebral disc degeneration, lumbar region: Secondary | ICD-10-CM | POA: Diagnosis not present

## 2023-07-31 DIAGNOSIS — M5030 Other cervical disc degeneration, unspecified cervical region: Secondary | ICD-10-CM | POA: Diagnosis not present

## 2023-07-31 DIAGNOSIS — M5126 Other intervertebral disc displacement, lumbar region: Secondary | ICD-10-CM | POA: Diagnosis not present

## 2023-08-12 DIAGNOSIS — M5416 Radiculopathy, lumbar region: Secondary | ICD-10-CM | POA: Diagnosis not present

## 2023-08-12 DIAGNOSIS — M5126 Other intervertebral disc displacement, lumbar region: Secondary | ICD-10-CM | POA: Diagnosis not present

## 2023-08-12 DIAGNOSIS — M48062 Spinal stenosis, lumbar region with neurogenic claudication: Secondary | ICD-10-CM | POA: Diagnosis not present

## 2023-08-17 ENCOUNTER — Telehealth: Payer: Self-pay | Admitting: Pharmacist

## 2023-08-17 NOTE — Progress Notes (Signed)
Outreach Note  08/17/2023 Name: Manuel [name] MRN: [id number] DOB: Feb 03, 1956  Referred by: Danelle Berry, PA-C Reason for referral : Medication Adherence   Pharmacy Quality Measure Review  This patient is appearing on report for being at risk of failing the measure for Statin Therapy for Patients with Cardiovascular Disease [facility]) medications this calendar year.   From review of chart, appears latest rosuvastatin 20 mg prescription is expired. Per dispensing history, appears prescription was only filled once, despite 3 refills provided  Was unable to reach patient via telephone today and have left HIPAA compliant voicemail asking patient to return my call.  Note patient's next appointment with PCP scheduled for 09/16/2023  Follow Up Plan: Will attempt to reach patient by telephone again within the next 30 days.  Estelle Grumbles, PharmD, [facility] [phone]

## 2023-08-17 NOTE — Progress Notes (Deleted)
08/18/2023  8:44 AM   [name] [birth year] [id number]  Referring provider: Danelle Berry, PA-C [address] [place],  [place] [address]  Urological history: 1. Elevated PSA -PSA pending -PSA (03/2019) 5.6  -Prostate MRI on 07/25/2019 revealed a small PI-RADS category 4 lesion in the right posterolateral peripheral zone of the prostate apex is present. Targeting data sent to [facility].  Considerable benign prostatic hypertrophy, prostate volume 78.53 cubic cm.  Thin peripheral zone with generalized low T2 signal, probably postinflammatory.  Sigmoid colon diverticulosis. 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 BPH with LU TS -s/p HoLEP 04/2021  3. High risk hematuria -smoker -work up in 2013.  He underwent a CT Urogram, cystoscopy with bilateral retrogrades and ultmately a TURP with Dr. Carin Primrose at [facility].  No malignancies were discovered.  Hematuria was from his enlarged, friable prostate.  He has not had any gross hematuria since that time.  A too small to characterize lesion was seen in the left midpole of the kidney on the CT Urogram in 2013.   RUS 05/2019 Increased echogenicity in the cortex of both kidneys is consistent with medical renal disease. No other acute abnormalities identified -cysto 10/2019 no malignancy found  -CTU 2022 and cysto 2022 negative for malignancy  4. ED -contributing factors of age, smoking, BPH, CAD, HLD, depression, DDD of lumbar spine and antidepressents -sildenafil 20 mg, 3 to 5 tablets prior to intercourse  No chief complaint on file.   HPI: Manuel [name] is a 67 y.o.  male who presents today for  follow up.  Previous records reviewed.  I PSS ***        Score:  1-7 Mild 8-19 Moderate 20-35 Severe  SHIM ***      Score: 1-7 Severe ED 8-11 Moderate ED 12-16 Mild-Moderate ED 17-21 Mild ED 22-25 No ED   PMH: Past Medical History:   Diagnosis Date   Anemia    Anxiety    Aortic atherosclerosis (HCC) 01/11/2018   CT scan Feb 2019   Asthma    Bilateral carpal tunnel syndrome    Bilateral hand numbness    BPH without urinary obstruction    Cervical spondylosis without myelopathy    Chronic kidney disease    STAGE 3   Complete tear of rotator cuff    bilateral   COPD (chronic obstructive pulmonary disease) (HCC)    Depression    Emphysema lung (HCC) 01/11/2018   Chest CT Feb 2019   History of stomach ulcers    HLD (hyperlipidemia)    LVH (left ventricular hypertrophy)    Personal history of tobacco use, presenting hazards to health 12/21/2015   Poor dentition    Sickle cell trait (HCC)    Sleep apnea    Smoker     Surgical History: Past Surgical History:  Procedure Laterality Date   CARPAL TUNNEL RELEASE Left    COLONOSCOPY WITH PROPOFOL N/A 12/25/2015   Procedure: COLONOSCOPY WITH PROPOFOL;  Surgeon: Midge Minium, MD;  Location: [facility];  Service: Endoscopy;  Laterality: N/A;   COLONOSCOPY WITH PROPOFOL N/A 05/02/2021   Procedure: COLONOSCOPY WITH PROPOFOL;  Surgeon: Midge Minium, MD;  Location: [facility];  Service: Endoscopy;  Laterality: N/A;   CYSTOURETHROSCOPY  08/25/12   with ureteral cathization w/wo retrograde pyelogram   HOLEP-LASER ENUCLEATION OF THE PROSTATE WITH MORCELLATION N/A 04/05/2021   Procedure: HOLEP-LASER ENUCLEATION OF THE PROSTATE WITH MORCELLATION;  Surgeon:  Sondra Come, MD;  Location: [facility];  Service: Urology;  Laterality: N/A;   POLYPECTOMY N/A 05/02/2021   Procedure: POLYPECTOMY;  Surgeon: Midge Minium, MD;  Location: [facility];  Service: Endoscopy;  Laterality: N/A;   ROTATOR CUFF REPAIR Right 01/25/15   SHOULDER ARTHROSCOPY WITH SUBACROMIAL DECOMPRESSION Right [id number]   TRANSURETHRAL RESECTION OF PROSTATE  08/25/12    Home Medications:  Allergies as of 08/18/2023       Reactions   Penicillin G Other (See Comments)   Childhood         Medication List        Accurate as of August 17, 2023  8:44 AM. If you have any questions, ask your nurse or doctor.          Baclofen 5 MG Tabs Take 1-2 tablets (5-10 mg total) by mouth 3 (three) times daily as needed (msk pain or spasms).   budesonide-formoterol 160-4.5 MCG/ACT inhaler Commonly known as: SYMBICORT Inhale 2 puffs into the lungs 2 (two) times daily. And can use as rescue/PRN 2 puffs up to 4 times a day for wheeze or cough, max 12 puffs in 24 hours   diphenhydramine-acetaminophen 25-500 MG Tabs tablet Commonly known as: TYLENOL PM Take 2 tablets by mouth at bedtime as needed (pain).   naproxen 500 MG tablet Commonly known as: NAPROSYN Take 1 tablet (500 mg total) by mouth 2 (two) times daily as needed for moderate pain.   rosuvastatin 20 MG tablet Commonly known as: Crestor Take 1 tablet (20 mg total) by mouth at bedtime.   sildenafil 20 MG tablet Commonly known as: REVATIO Take 3 to 5 tablets two hours before intercouse on an empty stomach.  Do not take with nitrates.   vitamin B-12 500 MCG tablet Commonly known as: CYANOCOBALAMIN Take 2 tablets (1,000 mcg total) by mouth daily.   VITAMIN D3 PO Take by mouth.        Allergies:  Allergies  Allergen Reactions   Penicillin G Other (See Comments)    Childhood    Family History: Family History  Problem Relation Age of Onset   Aneurysm Mother    Hypertension Mother    Diabetes Father    Hypertension Father    Prostate cancer Father    Lupus Father    Cancer Maternal Aunt    Cancer Maternal Uncle    Diabetes Paternal Uncle    Heart disease Cousin    Prostate cancer Paternal Uncle    COPD Neg Hx    Stroke Neg Hx    Kidney disease Neg Hx     Social History:  reports that he has been smoking cigarettes. He has a 35 pack-year smoking history. He has never used smokeless tobacco. He reports that he does not drink alcohol and does not use drugs.  ROS: For pertinent review of systems  please refer to history of present illness  Physical Exam: There were no vitals taken for this visit.  Constitutional:  Well nourished. Alert and oriented, No acute distress. HEENT: [place] AT, moist mucus membranes.  Trachea midline, no masses. Cardiovascular: No clubbing, cyanosis, or edema. Respiratory: Normal respiratory effort, no increased work of breathing. GI: Abdomen is soft, non tender, non distended, no abdominal masses. Liver and spleen not palpable.  No hernias appreciated.  Stool sample for occult testing is not indicated.   GU: No CVA tenderness.  No bladder fullness or masses.  Patient with circumcised/uncircumcised phallus. ***Foreskin easily retracted***  Urethral meatus is patent.  No penile discharge. No penile lesions or rashes. Scrotum without lesions, cysts, rashes and/or edema.  Testicles are located scrotally bilaterally. No masses are appreciated in the testicles. Left and right epididymis are normal. Rectal: Patient with  normal sphincter tone. Anus and perineum without scarring or rashes. No rectal masses are appreciated. Prostate is approximately *** grams, *** nodules are appreciated. Seminal vesicles are normal. Skin: No rashes, bruises or suspicious lesions. Lymph: No cervical or inguinal adenopathy. Neurologic: Grossly intact, no focal deficits, moving all 4 extremities. Psychiatric: Normal mood and affect.   Laboratory Data: CMP     Component Value Date/Time   NA 141 04/16/2023 1142   NA 142 01/10/2016 1613   K 4.2 04/16/2023 1142   CL 105 04/16/2023 1142   CO2 28 04/16/2023 1142   GLUCOSE 63 (L) 04/16/2023 1142   BUN 12 04/16/2023 1142   BUN 15 01/10/2016 1613   CREATININE 1.47 (H) 04/16/2023 1142   CALCIUM 9.2 04/16/2023 1142   PROT 6.8 04/16/2023 1142   PROT 6.5 11/02/2015 1303   ALBUMIN 3.7 01/29/2017 1450   ALBUMIN 4.0 11/02/2015 1303   AST 17 04/16/2023 1142   ALT 10 04/16/2023 1142   ALKPHOS 60 01/29/2017 1450   BILITOT 0.3 04/16/2023 1142    BILITOT 0.4 11/02/2015 1303   EGFR 53 (L) 04/16/2023 1142   GFRNONAA 53 (L) 10/04/2020 1552    CBC    Component Value Date/Time   WBC 5.3 04/16/2023 1142   RBC 5.66 04/16/2023 1142   HGB 14.2 04/16/2023 1142   HGB 13.1 01/10/2016 1613   HCT 43.8 04/16/2023 1142   HCT 38.7 01/10/2016 1613   PLT 258 04/16/2023 1142   PLT 230 01/10/2016 1613   MCV 77.4 (L) 04/16/2023 1142   MCV 74 (L) 01/10/2016 1613   MCH 25.1 (L) 04/16/2023 1142   MCHC 32.4 04/16/2023 1142   RDW 14.8 04/16/2023 1142   RDW 15.1 01/10/2016 1613   LYMPHSABS 1,622 04/16/2023 1142   LYMPHSABS 2.5 01/10/2016 1613   MONOABS 464 10/31/2016 1442   EOSABS 80 04/16/2023 1142   EOSABS 0.2 01/10/2016 1613   BASOSABS 21 04/16/2023 1142   BASOSABS 0.0 01/10/2016 1613  I have reviewed the labs.  Pertinent Imaging N/A   Assessment & Plan:    1. BPH with LU TS -PSA pending -prostate chips 04/2021-negative for malignancy  -symptoms - none  2. Erectile dysfunction -continue sildenafil 20 mg, on-demand-dosing -testosterone pending   3. High risk hematuria -work up completed in 2013 and 2022 - positive for an enlarged prostate -no reports of gross hematuria  No follow-ups on file.  Cloretta Ned  [facility] [address], [address] [place], [place] [address] [phone]

## 2023-08-18 ENCOUNTER — Ambulatory Visit: Payer: 59 | Admitting: Urology

## 2023-08-18 DIAGNOSIS — R319 Hematuria, unspecified: Secondary | ICD-10-CM

## 2023-08-18 DIAGNOSIS — N138 Other obstructive and reflux uropathy: Secondary | ICD-10-CM

## 2023-08-18 DIAGNOSIS — N529 Male erectile dysfunction, unspecified: Secondary | ICD-10-CM

## 2023-08-21 ENCOUNTER — Other Ambulatory Visit: Payer: Self-pay | Admitting: Pharmacist

## 2023-08-21 ENCOUNTER — Encounter: Payer: Self-pay | Admitting: Pharmacist

## 2023-08-21 DIAGNOSIS — E782 Mixed hyperlipidemia: Secondary | ICD-10-CM

## 2023-08-21 MED ORDER — ROSUVASTATIN CALCIUM 20 MG PO TABS
20.0000 mg | ORAL_TABLET | Freq: Every day | ORAL | 1 refills | Status: DC
Start: 2023-08-21 — End: 2024-03-14

## 2023-08-21 NOTE — Progress Notes (Signed)
08/21/2023  Patient ID: Manuel [name], male   DOB: [birth year], 67 y.o.   MRN: [id number]  Pharmacy Quality Measure Review   This patient is appearing on report for being at risk of failing the measure for Statin Therapy for Patients with Cardiovascular Disease [facility]) medications this calendar year.    From review of chart, appears latest rosuvastatin 20 mg prescription is expired. Per dispensing history, appears prescription was only filled once, despite 3 refills provided   Reach patient by telephone today. Reports he stopped taking rosuvastatin because he ran out  Counsel on importance of adherence to statin therapy/LDL control for ASCVD risk reduction Patient verbalizes understanding and request renewal of his rosuvastatin prescription  Lab Results  Component Value Date   CHOL 162 08/05/2022   HDL 53 08/05/2022   LDLCALC 89 08/05/2022   TRIG 104 08/05/2022   CHOLHDL 3.1 08/05/2022    Note patient's next appointment with PCP scheduled for 09/16/2023  Plan:  Will collaborate with PCP to ask provider to consider sending renewal of rosuvastatin prescription to [facility] pharmacy for patient  Estelle Grumbles, PharmD, [facility] [phone]

## 2023-08-31 ENCOUNTER — Other Ambulatory Visit: Payer: Self-pay

## 2023-08-31 MED ORDER — COMIRNATY 30 MCG/0.3ML IM SUSY
PREFILLED_SYRINGE | INTRAMUSCULAR | 0 refills | Status: DC
  Filled 2023-08-31: qty 0.3, 1d supply, fill #0

## 2023-09-02 DIAGNOSIS — M5126 Other intervertebral disc displacement, lumbar region: Secondary | ICD-10-CM | POA: Diagnosis not present

## 2023-09-02 DIAGNOSIS — M5416 Radiculopathy, lumbar region: Secondary | ICD-10-CM | POA: Diagnosis not present

## 2023-09-02 DIAGNOSIS — M5412 Radiculopathy, cervical region: Secondary | ICD-10-CM | POA: Diagnosis not present

## 2023-09-02 DIAGNOSIS — M48062 Spinal stenosis, lumbar region with neurogenic claudication: Secondary | ICD-10-CM | POA: Diagnosis not present

## 2023-09-16 ENCOUNTER — Other Ambulatory Visit (HOSPITAL_COMMUNITY)
Admission: RE | Admit: 2023-09-16 | Discharge: 2023-09-16 | Disposition: A | Discharge status: Home / Self Care | Payer: 59 | Source: Ambulatory Visit | Attending: Family Medicine | Admitting: Family Medicine

## 2023-09-16 ENCOUNTER — Encounter: Payer: Self-pay | Admitting: Physician Assistant

## 2023-09-16 ENCOUNTER — Ambulatory Visit (INDEPENDENT_AMBULATORY_CARE_PROVIDER_SITE_OTHER): Payer: 59 | Admitting: Physician Assistant

## 2023-09-16 VITALS — BP 128/84 | HR 74 | Temp 97.3°F | Resp 16 | Ht 70.0 in | Wt 160.4 lb

## 2023-09-16 DIAGNOSIS — E559 Vitamin D deficiency, unspecified: Secondary | ICD-10-CM

## 2023-09-16 DIAGNOSIS — E538 Deficiency of other specified B group vitamins: Secondary | ICD-10-CM

## 2023-09-16 DIAGNOSIS — E782 Mixed hyperlipidemia: Secondary | ICD-10-CM

## 2023-09-16 DIAGNOSIS — I700 Atherosclerosis of aorta: Secondary | ICD-10-CM

## 2023-09-16 DIAGNOSIS — F5101 Primary insomnia: Secondary | ICD-10-CM | POA: Diagnosis not present

## 2023-09-16 DIAGNOSIS — I2510 Atherosclerotic heart disease of native coronary artery without angina pectoris: Secondary | ICD-10-CM | POA: Diagnosis not present

## 2023-09-16 DIAGNOSIS — Z0000 Encounter for general adult medical examination without abnormal findings: Secondary | ICD-10-CM | POA: Diagnosis not present

## 2023-09-16 DIAGNOSIS — Z636 Dependent relative needing care at home: Secondary | ICD-10-CM | POA: Diagnosis not present

## 2023-09-16 DIAGNOSIS — N183 Chronic kidney disease, stage 3 unspecified: Secondary | ICD-10-CM | POA: Diagnosis not present

## 2023-09-16 DIAGNOSIS — Z7189 Other specified counseling: Secondary | ICD-10-CM | POA: Insufficient documentation

## 2023-09-16 DIAGNOSIS — Z136 Encounter for screening for cardiovascular disorders: Secondary | ICD-10-CM

## 2023-09-16 DIAGNOSIS — F339 Major depressive disorder, recurrent, unspecified: Secondary | ICD-10-CM

## 2023-09-16 DIAGNOSIS — Z113 Encounter for screening for infections with a predominantly sexual mode of transmission: Secondary | ICD-10-CM | POA: Insufficient documentation

## 2023-09-16 DIAGNOSIS — J439 Emphysema, unspecified: Secondary | ICD-10-CM

## 2023-09-16 MED ORDER — ALBUTEROL SULFATE HFA 108 (90 BASE) MCG/ACT IN AERS
2.0000 | INHALATION_SPRAY | Freq: Four times a day (QID) | RESPIRATORY_TRACT | 0 refills | Status: AC | PRN
Start: 2023-09-16 — End: ?

## 2023-09-16 MED ORDER — BUDESONIDE-FORMOTEROL FUMARATE 160-4.5 MCG/ACT IN AERO
2.0000 | INHALATION_SPRAY | Freq: Two times a day (BID) | RESPIRATORY_TRACT | 3 refills | Status: DC
Start: 2023-09-16 — End: 2024-03-14

## 2023-09-16 MED ORDER — DULOXETINE HCL 30 MG PO CPEP
30.0000 mg | ORAL_CAPSULE | Freq: Every day | ORAL | 1 refills | Status: DC
Start: 2023-09-16 — End: 2023-11-06

## 2023-09-16 MED ORDER — BENZONATATE 100 MG PO CAPS
100.0000 mg | ORAL_CAPSULE | Freq: Two times a day (BID) | ORAL | 1 refills | Status: AC | PRN
Start: 2023-09-16 — End: ?

## 2023-09-16 NOTE — Assessment & Plan Note (Signed)
Chronic, historic condition Appears well controlled on current regimen comprised of Rosuvastatin 20 mg PO every day  Recheck lipid panel today  Results to dictate further management  Follow up in 6 months or sooner if concerns arise

## 2023-09-16 NOTE — Progress Notes (Signed)
Annual Physical Exam   Name: Manuel [name]   MRN: [id number]    DOB: 1956/10/30   Date:09/16/2023  Today's Provider: Jacquelin Hawking, MHS, PA-C Introduced myself to the patient as a PA-C and provided education on APPs in clinical practice.         Subjective  Chief Complaint  Chief Complaint  Patient presents with   Hyperlipidemia   Medical Management of Chronic Issues    HPI  Patient presents for annual CPE .   Diet: he reports he has not been able to eat well- has been eating a lot of fast food. He has plans to change this in the coming weeks. Reports he going to try to cook more at home to get more vegetables in diet  Exercise: he is walking and running, he has exercise equipment at home. He walks and stretches every day. He uses exercise equipment 1-2 times per week. He also reports active job Sleep:"off and on" getting <6 hour per night, feels tired in the AM    HYPERLIPIDEMIA Satisfied with current treatment?  yes Side effects:  no Medication compliance: good compliance Past cholesterol meds: rosuvastatin (crestor) Supplements: none Aspirin:  no The 10-year ASCVD risk score (Arnett DK, et al., 2019) is: 15.3%   Values used to calculate the score:     Age: 67 years     Sex: Male     Is Non-Hispanic African American: Yes     Diabetic: No     Tobacco smoker: Yes     Systolic Blood Pressure: 128 mmHg     Is BP treated: No     HDL Cholesterol: 53 mg/dL     Total Cholesterol: 162 mg/dL Chest pain:  yes- "just a little bit but not major" reports this happens with exertion and increased depression.  Coronary artery disease:  yes    Mood/ Stress/ Insomnia He reports he is always stressed  He does have caregiver stress and concerns for his wife's health  He denies having previous conversation about starting medications He reports feeling lousy and lost, hurt with everything going on States he tries to push through with his responsibilities but the stressors  continue to linger    SOB/ COPD concerns Unsure if Pulm has confirmed Dx of COPD Most recent lung cancer screening CT demonstrates emphysema  He has not had testing completed yet  He reports he has been having productive cough - coughing up phlegm  He has been using Robitussin but this has not provided much relief  He is using Symbicort every night He does not have a rescue inhaler  He has been cutting down on cigarette use - he is smoking a pack about 2-3 days now   Depression: phq 9 is negative    09/16/2023    9:08 AM 06/05/2023   11:42 AM 04/16/2023   10:55 AM 08/05/2022    8:46 AM 07/15/2022   10:06 AM  Depression screen PHQ 2/9  Decreased Interest 0 0 0 0 0  Down, Depressed, Hopeless 0 0 0 0 0  PHQ - 2 Score 0 0 0 0 0  Altered sleeping 0  0 0 0  Tired, decreased energy 0  0 0 0  Change in appetite 0  0 0 0  Feeling bad or failure about yourself  0  0 0 0  Trouble concentrating 0  0 0 0  Moving slowly or fidgety/restless 0  0 0 0  Suicidal  thoughts 0  0 0 0  PHQ-9 Score 0  0 0 0  Difficult doing work/chores Not difficult at all Not difficult at all Not difficult at all Not difficult at all Not difficult at all       10/04/2020    2:51 PM 06/04/2020    2:43 PM  GAD 7 : Generalized Anxiety Score  Nervous, Anxious, on Edge 0 0  Control/stop worrying 0 1  Worry too much - different things 1 1  Trouble relaxing 3 1  Restless 2 1  Easily annoyed or irritable 0 0  Afraid - awful might happen 0 1  Total GAD 7 Score 6 5  Anxiety Difficulty  Somewhat difficult      Hypertension:  BP Readings from Last 3 Encounters:  09/16/23 128/84  06/05/23 130/82  04/16/23 118/80    Obesity: Wt Readings from Last 3 Encounters:  09/16/23 160 lb 6.4 oz (72.8 kg)  06/05/23 157 lb 9.6 oz (71.5 kg)  06/02/23 160 lb (72.6 kg)   BMI Readings from Last 3 Encounters:  09/16/23 23.02 kg/m  06/05/23 22.61 kg/m  06/02/23 22.96 kg/m     Lipids:  Lab Results  Component Value Date    CHOL 162 08/05/2022   CHOL 165 12/18/2021   CHOL 213 (H) 08/15/2021   Lab Results  Component Value Date   HDL 53 08/05/2022   HDL 45 12/18/2021   HDL 52 08/15/2021   Lab Results  Component Value Date   LDLCALC 89 08/05/2022   LDLCALC 103 (H) 12/18/2021   LDLCALC 138 (H) 08/15/2021   Lab Results  Component Value Date   TRIG 104 08/05/2022   TRIG 83 12/18/2021   TRIG 114 08/15/2021   Lab Results  Component Value Date   CHOLHDL 3.1 08/05/2022   CHOLHDL 3.7 12/18/2021   CHOLHDL 4.1 08/15/2021   No results found for: "LDLDIRECT" Glucose:  Glucose, Bld  Date Value Ref Range Status  04/16/2023 63 (L) 65 - 99 mg/dL Final    Comment:    .            Fasting reference interval .   08/05/2022 82 65 - 139 mg/dL Final    Comment:    .        Non-fasting reference interval .   04/14/2022 77 65 - 99 mg/dL Final    Comment:    .            Fasting reference interval .     Flowsheet Row Clinical Support from 06/05/2023 [facility]  AUDIT-C Score 0        Married STD testing and prevention (HIV/chl/gon/syphilis):  no, declines today  Skin cancer: Discussed monitoring for atypical lesions Colorectal cancer screening: UTD- next due in 2025  Prostate cancer screening:  ordered No results found for: "PSA"   Health Maintenance  Topic Date Due   DTaP/Tdap/Td vaccine (2 - Tdap) 11/04/2023   COVID-19 Vaccine (4 - 2023-24 season) 01/01/2024   Colon Cancer Screening  05/02/2024   Screening for Lung Cancer  06/01/2024   Medicare Annual Wellness Visit  06/04/2024   Pneumonia Vaccine  Completed   Flu Shot  Completed   Hepatitis C Screening  Completed   Zoster (Shingles) Vaccine  Completed   HIV Screening  Addressed   HPV Vaccine  Aged Out     Lung cancer:  Low Dose CT Chest recommended if Age 45-80 years, 30 pack-year currently smoking OR have  quit w/in 15years. Patient  yes- order placed in Aug, awaiting completion  AAA: The USPSTF  recommends one-time screening with ultrasonography in men ages 71 to 75 years who have ever smoked. Patient:  yes-ordered today  ECG:  NA    Advanced Care Planning: A voluntary discussion about advance care planning including the explanation and discussion of advance directives.  Discussed health care proxy and Living will, and the patient was able to identify a health care proxy as no one.  Patient does not have a living will in effect at this time.  Patient Active Problem List   Diagnosis Date Noted   Advanced care planning/counseling discussion 09/16/2023   Caregiver stress 08/15/2021   Hx of colonic polyps    Polyp of sigmoid colon    Primary insomnia 10/31/2020   Chronic kidney disease, stage III (moderate) (HCC) 11/12/2018   Recurrent depression (HCC) 07/15/2018   Chronic tension-type headache, intractable 05/17/2018   Myofascial pain syndrome 05/17/2018   Chronic daily headache 04/12/2018   Coronary artery disease 01/11/2018   Aortic atherosclerosis (HCC) 01/11/2018   Pulmonary emphysema (HCC) 01/11/2018   Mass of left thigh 12/30/2017   Vitamin D deficiency 01/14/2016   Vitamin B12 deficiency 01/14/2016   Renal lesion 11/13/2015   Chronic lower back pain 11/09/2015   BPH (benign prostatic hyperplasia) 11/01/2015   Elevated PSA 11/01/2015   Hyperlipidemia 11/01/2015   Bunion, left foot 11/01/2015   Sickle cell trait (HCC) 01/16/2015   Cervical spondylosis without myelopathy 01/09/2015   Carpal tunnel syndrome 01/09/2015   Rotator cuff syndrome of left shoulder 09/18/2014   LVH (left ventricular hypertrophy) 08/18/2014   Current smoker 10/03/2013    Past Surgical History:  Procedure Laterality Date   CARPAL TUNNEL RELEASE Left    COLONOSCOPY WITH PROPOFOL N/A 12/25/2015   Procedure: COLONOSCOPY WITH PROPOFOL;  Surgeon: Midge Minium, MD;  Location: [facility];  Service: Endoscopy;  Laterality: N/A;   COLONOSCOPY WITH PROPOFOL N/A 05/02/2021   Procedure: COLONOSCOPY  WITH PROPOFOL;  Surgeon: Midge Minium, MD;  Location: [facility];  Service: Endoscopy;  Laterality: N/A;   CYSTOURETHROSCOPY  08/25/12   with ureteral cathization w/wo retrograde pyelogram   HOLEP-LASER ENUCLEATION OF THE PROSTATE WITH MORCELLATION N/A 04/05/2021   Procedure: HOLEP-LASER ENUCLEATION OF THE PROSTATE WITH MORCELLATION;  Surgeon: Sondra Come, MD;  Location: [facility] ORS;  Service: Urology;  Laterality: N/A;   POLYPECTOMY N/A 05/02/2021   Procedure: POLYPECTOMY;  Surgeon: Midge Minium, MD;  Location: [facility];  Service: Endoscopy;  Laterality: N/A;   ROTATOR CUFF REPAIR Right 01/25/15   SHOULDER ARTHROSCOPY WITH SUBACROMIAL DECOMPRESSION Right [id number]   TRANSURETHRAL RESECTION OF PROSTATE  08/25/12    Family History  Problem Relation Age of Onset   Aneurysm Mother    Hypertension Mother    Diabetes Father    Hypertension Father    Prostate cancer Father    Lupus Father    Cancer Maternal Aunt    Cancer Maternal Uncle    Diabetes Paternal Uncle    Heart disease Cousin    Prostate cancer Paternal Uncle    COPD Neg Hx    Stroke Neg Hx    Kidney disease Neg Hx     Social History   Socioeconomic History   Marital status: Married    Spouse name: Not on file   Number of children: Not on file   Years of education: Not on file   Highest education level: Not on file  Occupational History  Not on file  Tobacco Use   Smoking status: Every Day    Current packs/day: 1.00    Average packs/day: 1 pack/day for 35.0 years (35.0 ttl pk-yrs)    Types: Cigarettes   Smokeless tobacco: Never  Vaping Use   Vaping status: Never Used  Substance and Sexual Activity   Alcohol use: No   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   Sexual activity: Yes  Other Topics Concern   Not on file  Social History Narrative   Not on file   Social Determinants of Health   Financial Resource Strain: Low Risk  (06/05/2023)   Overall Financial Resource Strain  (CARDIA)    Difficulty of Paying Living Expenses: Not hard at all  Food Insecurity: No Food Insecurity (06/05/2023)   Hunger Vital Sign    Worried About Running Out of Food in the Last Year: Never true    Ran Out of Food in the Last Year: Never true  Transportation Needs: No Transportation Needs (06/05/2023)   PRAPARE - Administrator, Civil Service (Medical): No    Lack of Transportation (Non-Medical): No  Physical Activity: Inactive (06/05/2023)   Exercise Vital Sign    Days of Exercise per Week: 0 days    Minutes of Exercise per Session: 0 min  Stress: Stress Concern Present (06/05/2023)   [employer] of Occupational Health - Occupational Stress Questionnaire    Feeling of Stress : To some extent  Social Connections: Moderately Isolated (06/05/2023)   Social Connection and Isolation Panel [NHANES]    Frequency of Communication with Friends and Family: More than three times a week    Frequency of Social Gatherings with Friends and Family: Three times a week    Attends Religious Services: Never    Active Member of Clubs or Organizations: No    Attends Banker Meetings: Never    Marital Status: Married  Catering manager Violence: Not At Risk (06/05/2023)   Humiliation, Afraid, Rape, and Kick questionnaire    Fear of Current or Ex-Partner: No    Emotionally Abused: No    Physically Abused: No    Sexually Abused: No     Current Outpatient Medications:    albuterol (VENTOLIN HFA) 108 (90 Base) MCG/ACT inhaler, Inhale 2 puffs into the lungs every 6 (six) hours as needed for wheezing or shortness of breath., Disp: 8 g, Rfl: 0   benzonatate (TESSALON) 100 MG capsule, Take 1 capsule (100 mg total) by mouth 2 (two) times daily as needed for cough., Disp: 30 capsule, Rfl: 1   Cholecalciferol (VITAMIN D3 PO), Take by mouth., Disp: , Rfl:    COVID-19 mRNA vaccine, Pfizer, (COMIRNATY) syringe, Inject into the muscle., Disp: 0.3 mL, Rfl: 0   diphenhydramine-acetaminophen  (TYLENOL PM) 25-500 MG TABS tablet, Take 2 tablets by mouth at bedtime as needed (pain)., Disp: , Rfl:    DULoxetine (CYMBALTA) 30 MG capsule, Take 1 capsule (30 mg total) by mouth daily., Disp: 30 capsule, Rfl: 1   naproxen (NAPROSYN) 500 MG tablet, Take 1 tablet (500 mg total) by mouth 2 (two) times daily as needed for moderate pain., Disp: 60 tablet, Rfl: 0   rosuvastatin (CRESTOR) 20 MG tablet, Take 1 tablet (20 mg total) by mouth at bedtime., Disp: 90 tablet, Rfl: 1   sildenafil (REVATIO) 20 MG tablet, Take 3 to 5 tablets two hours before intercouse on an empty stomach.  Do not take with nitrates., Disp: 30  tablet, Rfl: 3   vitamin B-12 (CYANOCOBALAMIN) 500 MCG tablet, Take 2 tablets (1,000 mcg total) by mouth daily., Disp: 30 tablet, Rfl: 11   Baclofen 5 MG TABS, Take 1-2 tablets (5-10 mg total) by mouth 3 (three) times daily as needed (msk pain or spasms). (Patient not taking: Reported on 09/16/2023), Disp: 60 tablet, Rfl: 0   budesonide-formoterol (SYMBICORT) 160-4.5 MCG/ACT inhaler, Inhale 2 puffs into the lungs 2 (two) times daily. And can use as rescue/PRN 2 puffs up to 4 times a day for wheeze or cough, max 12 puffs in 24 hours, Disp: 1 each, Rfl: 3  Allergies  Allergen Reactions   Penicillin G Other (See Comments)    Childhood     Review of Systems  Constitutional:  Positive for malaise/fatigue. Negative for chills, fever and weight loss.  HENT:  Negative for hearing loss, nosebleeds, sore throat and tinnitus.   Eyes:  Positive for blurred vision. Negative for double vision.       Report vision issues even with glasses   Respiratory:  Positive for cough, sputum production and shortness of breath.   Cardiovascular:  Positive for chest pain. Negative for palpitations and leg swelling.  Gastrointestinal:  Positive for heartburn. Negative for blood in stool, constipation, diarrhea, nausea and vomiting.  Musculoskeletal:  Positive for joint pain and myalgias. Negative for falls.   Skin:  Negative for itching and rash.  Neurological:  Positive for tingling and headaches. Negative for dizziness, tremors, loss of consciousness and weakness.  Psychiatric/Behavioral:  Positive for depression. The patient is nervous/anxious and has insomnia.        Objective  Vitals:   09/16/23 0909  BP: 128/84  Pulse: 74  Resp: 16  Temp: (!) 97.3 F (36.3 C)  TempSrc: Oral  SpO2: 98%  Weight: 160 lb 6.4 oz (72.8 kg)  Height: 5\' 10"  (1.778 m)    Body mass index is 23.02 kg/m.  Physical Exam Vitals reviewed.  Constitutional:      General: He is awake.     Appearance: Normal appearance. He is well-developed and well-groomed.  HENT:     Head: Normocephalic and atraumatic.     Right Ear: Hearing, tympanic membrane and ear canal normal.     Left Ear: Hearing, tympanic membrane and ear canal normal.     Nose: Nose normal.     Mouth/Throat:     Lips: Pink.     Mouth: Mucous membranes are moist. No lacerations or oral lesions.     Pharynx: Oropharynx is clear. Uvula midline. No pharyngeal swelling, oropharyngeal exudate or posterior oropharyngeal erythema.  Eyes:     General: Lids are normal. Gaze aligned appropriately.     Extraocular Movements: Extraocular movements intact.     Right eye: Normal extraocular motion and no nystagmus.     Left eye: Normal extraocular motion and no nystagmus.     Conjunctiva/sclera: Conjunctivae normal.     Pupils: Pupils are equal, round, and reactive to light.  Neck:     Thyroid: No thyroid mass, thyromegaly or thyroid tenderness.     Trachea: Phonation normal.  Cardiovascular:     Rate and Rhythm: Normal rate and regular rhythm.     Pulses: Normal pulses.          Radial pulses are 2+ on the right side and 2+ on the left side.     Heart sounds: Normal heart sounds. No murmur heard.    No friction rub. No gallop.  Pulmonary:  Effort: Pulmonary effort is normal.     Breath sounds: Normal breath sounds. No decreased air movement.  No decreased breath sounds, wheezing, rhonchi or rales.  Abdominal:     General: Abdomen is flat. Bowel sounds are normal.     Palpations: Abdomen is soft.     Tenderness: There is no abdominal tenderness.  Musculoskeletal:     Cervical back: Normal range of motion and neck supple.     Right lower leg: No edema.     Left lower leg: No edema.  Lymphadenopathy:     Head:     Right side of head: No submental, submandibular or preauricular adenopathy.     Left side of head: No submental, submandibular or preauricular adenopathy.     Cervical: No cervical adenopathy.     Right cervical: No superficial or posterior cervical adenopathy.    Left cervical: No superficial or posterior cervical adenopathy.     Upper Body:     Right upper body: No supraclavicular adenopathy.     Left upper body: No supraclavicular adenopathy.  Skin:    General: Skin is warm and dry.  Neurological:     General: No focal deficit present.     Mental Status: He is alert and oriented to person, place, and time. Mental status is at baseline.     GCS: GCS eye subscore is 4. GCS verbal subscore is 5. GCS motor subscore is 6.     Cranial Nerves: No cranial nerve deficit, dysarthria or facial asymmetry.     Motor: No weakness, tremor, atrophy or abnormal muscle tone.     Gait: Gait is intact.     Deep Tendon Reflexes:     Reflex Scores:      Patellar reflexes are 2+ on the right side and 2+ on the left side. Psychiatric:        Attention and Perception: Attention and perception normal.        Mood and Affect: Mood and affect normal.        Speech: Speech normal.        Behavior: Behavior normal. Behavior is cooperative.        Thought Content: Thought content normal.        Cognition and Memory: Cognition normal.        Judgment: Judgment normal.      Recent Results (from the past 2160 hour(s))  CBC with Differential/Platelet     Status: Abnormal   Collection Time: 09/16/23 10:15 AM  Result Value Ref Range    WBC 5.2 3.8 - 10.8 Thousand/uL   RBC 5.86 (H) 4.20 - 5.80 Million/uL   Hemoglobin 14.5 13.2 - 17.1 g/dL   HCT 27.2 53.6 - 64.4 %   MCV 75.9 (L) 80.0 - 100.0 fL   MCH 24.7 (L) 27.0 - 33.0 pg   MCHC 32.6 32.0 - 36.0 g/dL    Comment: For adults, a slight decrease in the calculated MCHC value (in the range of 30 to 32 g/dL) is most likely not clinically significant; however, it should be interpreted with caution in correlation with other red cell parameters and the patient's clinical condition.    RDW 14.0 11.0 - 15.0 %   Platelets 277 140 - 400 Thousand/uL   MPV 10.0 7.5 - 12.5 fL   Neutro Abs 2,865 1,500 - 7,800 cells/uL   Absolute Lymphocytes 1,815 850 - 3,900 cells/uL   Absolute Monocytes 400 200 - 950 cells/uL   Eosinophils Absolute 88 15 -  500 cells/uL   Basophils Absolute 31 0 - 200 cells/uL   Neutrophils Relative % 55.1 %   Total Lymphocyte 34.9 %   Monocytes Relative 7.7 %   Eosinophils Relative 1.7 %   Basophils Relative 0.6 %     Fall Risk:    09/16/2023    9:08 AM 06/05/2023   11:36 AM 04/16/2023   10:55 AM 08/05/2022    8:46 AM 07/15/2022   10:05 AM  Fall Risk   Falls in the past year? 0 0 0 0 0  Number falls in past yr: 0 0 0 0 0  Injury with Fall? 0 0 0 0 0  Risk for fall due to : No Fall Risks No Fall Risks No Fall Risks No Fall Risks No Fall Risks  Follow up Falls prevention discussed;Education provided;Falls evaluation completed Education provided;Falls prevention discussed Falls prevention discussed;Education provided;Falls evaluation completed Falls prevention discussed;Education provided Falls prevention discussed;Education provided     Functional Status Survey: Is the patient deaf or have difficulty hearing?: Yes Does the patient have difficulty seeing, even when wearing glasses/contacts?: Yes Does the patient have difficulty concentrating, remembering, or making decisions?: No Does the patient have difficulty walking or climbing stairs?: Yes Does the  patient have difficulty dressing or bathing?: No Does the patient have difficulty doing errands alone such as visiting a doctor's office or shopping?: No    Assessment & Plan  Problem List Items Addressed This Visit       Cardiovascular and Mediastinum   Coronary artery disease (Chronic)    Chronic, historic condition He reports some intermittent chest pains with increased exertion and stress/depression Recommend he stays on statin therapy He is trying to cut back on smoking- encouraged cessation efforts May need cardiology apt for stress testing if this continues Follow up in 6 months or sooner if concerns arise        Relevant Orders   Ambulatory referral to Cardiology   Aortic atherosclerosis (HCC) (Chronic)    Chronic, historic condition Appears he is on statin therapy May need Cardiology referral for monitoring given CAD Follow up in 6 months or sooner if concerns arise          Respiratory   Pulmonary emphysema (HCC)    Chronic, historic condition Appears to be managed for now He reports he needs to complete lung function testing with Pulmonology  Recommend he continues to follow up with Pulmonology as directed  Continue with Symbicort and will provide rescue inhaler as well Follow up in 3 months or sooner if concerns arise        Relevant Medications   benzonatate (TESSALON) 100 MG capsule   albuterol (VENTOLIN HFA) 108 (90 Base) MCG/ACT inhaler   budesonide-formoterol (SYMBICORT) 160-4.5 MCG/ACT inhaler     Genitourinary   Chronic kidney disease, stage III (moderate) (HCC)    Recheck CMP today  Results to dictate further management  Followed by Nephrology- most recently seen 06/23/23 for follow up  Reviewed these notes - recommend kidney dosing and avoiding NSAIDS Will continue to provide BP control, screening for DM today with A1c May need to add ACEi or ARB to assist with kidney protection if tolerated.  Follow up in 6 months or sooner if concerns  arise           Other   Hyperlipidemia (Chronic)    Chronic, historic condition Appears well controlled on current regimen comprised of Rosuvastatin 20 mg PO every day  Recheck lipid panel today  Results to dictate further management  Follow up in 6 months or sooner if concerns arise        Relevant Orders   Ambulatory referral to Cardiology   Vitamin D deficiency    Recheck labs  Results to dictate further management        Relevant Orders   Vitamin D (25 hydroxy)   Vitamin B12 deficiency    Recheck labs Results to dictate further management        Relevant Orders   B12   Recurrent depression (HCC)    Chronic, recurrent  Reports significant caregiver stress from looking after his wife He reports she is back to her normal - no longer having mood swings or abnormal behaviors. He reports she mostly stays in the bedroom and does not engage much  He reports when she takes her Seroquel she falls asleep so he can get some rest at this point  He is amenable to starting a medication at this time for depression management Will try Duloxetine 30 mg PO every day to assist with depression symptoms and muscle aches Recommend taking in the AM to avoid sleeplessness. Discussed side effects and return precautions Follow up in 6 weeks to assess response       Relevant Medications   DULoxetine (CYMBALTA) 30 MG capsule   Primary insomnia    Chronic, per patient  He reports some difficulty with falling asleep due to caregiver stress and taking care of his wife  He reports he is able to rest when she falls asleep after taking her Seroquel  Will try to assist with this by adding Duloxetine 30 mg PO every day to assist with stress and depressed mood May need to add Trazodone at follow up  Will provide sleep hygiene recommendations for now  Follow up in 6 weeks or sooner if concerns arise       Caregiver stress   Relevant Medications   DULoxetine (CYMBALTA) 30 MG capsule    Advanced care planning/counseling discussion    A voluntary discussion about advance care planning including the explanation and discussion of advance directives was extensively discussed  with the patient for 5  minutes with patient and myself  present.  Explanation about the health care proxy and Living will was reviewed and packet with forms with explanation of how to fill them out was given.  During this discussion, the patient was able to identify a health care proxy as  no one currently and plans /does not plan to fill out the paperwork required.  Patient was offered a separate Advance Care Planning visit for further assistance with forms.         Other Visit Diagnoses     Annual physical exam    -  Primary   Relevant Orders   TSH   Hemoglobin A1c   Lipid panel   CBC with Differential/Platelet (Completed)   COMPLETE METABOLIC PANEL WITH GFR   PSA   Screening examination for STD (sexually transmitted disease)       Relevant Orders   HIV antibody (with reflex)   RPR   Urine cytology ancillary only   Encounter for abdominal aortic aneurysm (AAA) screening       Relevant Orders   US AORTA DUPLEX COMPLETE      -Prostate cancer screening and PSA options (with potential risks and benefits of testing vs not testing) were discussed along with recent recs/guidelines. -USPSTF grade A and B recommendations reviewed with patient; age-appropriate recommendations, preventive  care, screening tests, etc discussed and encouraged; healthy living encouraged; see AVS for patient education given to patient -Discussed importance of 150 minutes of physical activity weekly, eat two servings of fish weekly, eat one serving of tree nuts ( cashews, pistachios, pecans, almonds.Marland Kitchen) every other day, eat 6 servings of fruit/vegetables daily and drink plenty of water and avoid sweet beverages.  -Reviewed Health Maintenance: yes  Return in about 6 weeks (around 10/28/2023) for Depression, insomnia .   I, Denys Labree  E Torrian Canion, PA-C, have reviewed all documentation for this visit. The documentation on 09/16/23 for the exam, diagnosis, procedures, and orders are all accurate and complete.   Jacquelin Hawking, MHS, PA-C [facility]

## 2023-09-16 NOTE — Assessment & Plan Note (Signed)
Recheck labs Results to dictate further management  

## 2023-09-16 NOTE — Assessment & Plan Note (Signed)
Chronic, historic condition He reports some intermittent chest pains with increased exertion and stress/depression Recommend he stays on statin therapy He is trying to cut back on smoking- encouraged cessation efforts May need cardiology apt for stress testing if this continues Follow up in 6 months or sooner if concerns arise

## 2023-09-16 NOTE — Assessment & Plan Note (Addendum)
Chronic, recurrent  Reports significant caregiver stress from looking after his wife He reports she is back to her normal - no longer having mood swings or abnormal behaviors. He reports she mostly stays in the bedroom and does not engage much  He reports when she takes her Seroquel she falls asleep so he can get some rest at this point  He is amenable to starting a medication at this time for depression management Will try Duloxetine 30 mg PO every day to assist with depression symptoms and muscle aches Recommend taking in the AM to avoid sleeplessness. Discussed side effects and return precautions Follow up in 6 weeks to assess response

## 2023-09-16 NOTE — Assessment & Plan Note (Signed)
Chronic, per patient  He reports some difficulty with falling asleep due to caregiver stress and taking care of his wife  He reports he is able to rest when she falls asleep after taking her Seroquel  Will try to assist with this by adding Duloxetine 30 mg PO every day to assist with stress and depressed mood May need to add Trazodone at follow up  Will provide sleep hygiene recommendations for now  Follow up in 6 weeks or sooner if concerns arise

## 2023-09-16 NOTE — Assessment & Plan Note (Signed)
Recheck CMP today  Results to dictate further management  Followed by Nephrology- most recently seen 06/23/23 for follow up  Reviewed these notes - recommend kidney dosing and avoiding NSAIDS Will continue to provide BP control, screening for DM today with A1c May need to add ACEi or ARB to assist with kidney protection if tolerated.  Follow up in 6 months or sooner if concerns arise

## 2023-09-16 NOTE — Assessment & Plan Note (Signed)
Chronic, historic condition Appears he is on statin therapy May need Cardiology referral for monitoring given CAD Follow up in 6 months or sooner if concerns arise

## 2023-09-16 NOTE — Assessment & Plan Note (Signed)
A voluntary discussion about advance care planning including the explanation and discussion of advance directives was extensively discussed  with the patient for 5  minutes with patient and myself  present.  Explanation about the health care proxy and Living will was reviewed and packet with forms with explanation of how to fill them out was given.  During this discussion, the patient was able to identify a health care proxy as  no one currently and plans /does not plan to fill out the paperwork required.  Patient was offered a separate Advance Care Planning visit for further assistance with forms.

## 2023-09-16 NOTE — Assessment & Plan Note (Signed)
Chronic, historic condition Appears to be managed for now He reports he needs to complete lung function testing with Pulmonology  Recommend he continues to follow up with Pulmonology as directed  Continue with Symbicort and will provide rescue inhaler as well Follow up in 3 months or sooner if concerns arise

## 2023-09-16 NOTE — Patient Instructions (Signed)
I have sent in a rescue inhaler for you to use to help with breathing - you can use this as needed up to every 6 hours to help with breathing and coughing I have sent in a script for Tessalon pearls to assist with your coughing. Please call your Pulmonology provider to schedule your lung testing soon I have sent in a script for an antidepressant called Duloxetine (cymbalta) 30 mg to be taken once per day by mouth in the AM  Please let us know if you have side effects or concerns while starting this  I would like to see you in 6 weeks to discuss your response to it  Sleep hygiene (Practices to help improve sleep)  *Try to go to bed at the same time every night *Make your room as dark and quiet as possible for sleep (ambient noises or sleep machine is okay too) *Try to establish a bedtime routine before bed (shower, reading for a little bit, etc.) *No TVs or phone for at least to an hour before bed. (The blue light from these devices can affect your sleep-wake cycle and make it harder to go to sleep) *Avoid caffeine in the afternoon and especially before bedtime.  The goal is to establish a relaxing environment and routine that signals your body that it is time to go to sleep.

## 2023-09-17 LAB — CBC WITH DIFFERENTIAL/PLATELET
Absolute Lymphocytes: 1815 {cells}/uL (ref 850–3900)
Absolute Monocytes: 400 {cells}/uL (ref 200–950)
Basophils Absolute: 31 {cells}/uL (ref 0–200)
Basophils Relative: 0.6 %
Eosinophils Absolute: 88 {cells}/uL (ref 15–500)
Eosinophils Relative: 1.7 %
HCT: 44.5 % (ref 38.5–50.0)
Hemoglobin: 14.5 g/dL (ref 13.2–17.1)
MCH: 24.7 pg — ABNORMAL LOW (ref 27.0–33.0)
MCHC: 32.6 g/dL (ref 32.0–36.0)
MCV: 75.9 fL — ABNORMAL LOW (ref 80.0–100.0)
MPV: 10 fL (ref 7.5–12.5)
Monocytes Relative: 7.7 %
Neutro Abs: 2865 {cells}/uL (ref 1500–7800)
Neutrophils Relative %: 55.1 %
Platelets: 277 10*3/uL (ref 140–400)
RBC: 5.86 10*6/uL — ABNORMAL HIGH (ref 4.20–5.80)
RDW: 14 % (ref 11.0–15.0)
Total Lymphocyte: 34.9 %
WBC: 5.2 10*3/uL (ref 3.8–10.8)

## 2023-09-17 LAB — LIPID PANEL
Cholesterol: 193 mg/dL (ref <200)
HDL: 45 mg/dL (ref >=40)
LDL Cholesterol (Calc): 125 mg/dL — ABNORMAL HIGH
Non-HDL Cholesterol (Calc): 148 mg/dL — ABNORMAL HIGH (ref <130)
Total CHOL/HDL Ratio: 4.3 (calc) (ref <5.0)
Triglycerides: 120 mg/dL (ref <150)

## 2023-09-17 LAB — PSA: PSA: 1.35 ng/mL (ref <=4.00)

## 2023-09-17 LAB — COMPLETE METABOLIC PANEL WITH GFR
AG Ratio: 1.4 (calc) (ref 1.0–2.5)
ALT: 10 U/L (ref 9–46)
AST: 16 U/L (ref 10–35)
Albumin: 4 g/dL (ref 3.6–5.1)
Alkaline phosphatase (APISO): 69 U/L (ref 35–144)
BUN/Creatinine Ratio: 9 (calc) (ref 6–22)
BUN: 13 mg/dL (ref 7–25)
CO2: 31 mmol/L (ref 20–32)
Calcium: 9.5 mg/dL (ref 8.6–10.3)
Chloride: 103 mmol/L (ref 98–110)
Creat: 1.45 mg/dL — ABNORMAL HIGH (ref 0.70–1.35)
Globulin: 2.9 g/dL (ref 1.9–3.7)
Glucose, Bld: 73 mg/dL (ref 65–99)
Potassium: 4.5 mmol/L (ref 3.5–5.3)
Sodium: 141 mmol/L (ref 135–146)
Total Bilirubin: 0.3 mg/dL (ref 0.2–1.2)
Total Protein: 6.9 g/dL (ref 6.1–8.1)
eGFR: 53 mL/min/{1.73_m2} — ABNORMAL LOW (ref >=60)

## 2023-09-17 LAB — URINE CYTOLOGY ANCILLARY ONLY
Chlamydia: NEGATIVE
Comment: NEGATIVE
Comment: NORMAL
Neisseria Gonorrhea: NEGATIVE

## 2023-09-17 LAB — VITAMIN B12: Vitamin B-12: 273 pg/mL (ref 200–1100)

## 2023-09-17 LAB — HIV ANTIBODY (ROUTINE TESTING W REFLEX): HIV 1&2 Ab, 4th Generation: NONREACTIVE

## 2023-09-17 LAB — HEMOGLOBIN A1C
Hgb A1c MFr Bld: 6 %{Hb} — ABNORMAL HIGH (ref <5.7)
Mean Plasma Glucose: 126 mg/dL
eAG (mmol/L): 7 mmol/L

## 2023-09-17 LAB — RPR: RPR Ser Ql: NONREACTIVE

## 2023-09-17 LAB — VITAMIN D 25 HYDROXY (VIT D DEFICIENCY, FRACTURES): Vit D, 25-Hydroxy: 22 ng/mL — ABNORMAL LOW (ref 30–100)

## 2023-09-17 LAB — TSH: TSH: 0.95 m[IU]/L (ref 0.40–4.50)

## 2023-09-21 DIAGNOSIS — M7542 Impingement syndrome of left shoulder: Secondary | ICD-10-CM | POA: Diagnosis not present

## 2023-09-21 MED ORDER — VITAMIN D (ERGOCALCIFEROL) 1.25 MG (50000 UNIT) PO CAPS: 50000.0000 [IU] | ORAL_CAPSULE | ORAL | 0 refills | Status: DC

## 2023-09-21 NOTE — Addendum Note (Signed)
Addended by: Jacquelin Hawking on: 09/21/2023 08:36 AM   Modules accepted: Orders

## 2023-09-21 NOTE — Progress Notes (Signed)
Your labs are back Your A1c was 6.0% which is in the prediabetic range. No medications are indicated at this time but I do recommend reducing your sugar and carb intake and making sure you are exercising regularly Your cholesterol has increased since last year. Please continue to take your rosuvastatin and try to reduce saturated fats in your diet to help provide further control. I recommend rechecking this in 6 months  Your CBC was overall stable. No signs of anemia Your electrolytes and liver function were in normal ranges. Your Kidney function is decreased but appears to be at a stable level compared to your previous results. Your STD testing (gonorrhea, chlamydia, syphilis, HIV ) were all negative. Your Prostates and thyroid tests were normal Your B12 was on the lower side of normal- I recommend supplementing with an over the counter supplement to bring this up a bit. You Vitamin D was low- I have sent in a weekly Vitamin D supplement for you to take for 12 weeks to help improve this. We can recheck in about 3 months.  Please let us know if you have questions or concerns.

## 2023-09-23 ENCOUNTER — Ambulatory Visit
Admission: RE | Admit: 2023-09-23 | Discharge: 2023-09-23 | Disposition: A | Discharge status: Home / Self Care | Payer: 59 | Source: Ambulatory Visit | Attending: Physician Assistant | Admitting: Physician Assistant

## 2023-09-23 DIAGNOSIS — Z136 Encounter for screening for cardiovascular disorders: Secondary | ICD-10-CM

## 2023-09-28 NOTE — Progress Notes (Signed)
The ultrasound screening of your aorta showed some cholesterol plaques which can be expected for patient's at age 68 and over.  They did not find any evidence of aneurysm at this time.  Please let us know if you have further questions or concerns

## 2023-10-06 DIAGNOSIS — E782 Mixed hyperlipidemia: Secondary | ICD-10-CM | POA: Diagnosis not present

## 2023-10-06 DIAGNOSIS — R0789 Other chest pain: Secondary | ICD-10-CM | POA: Diagnosis not present

## 2023-11-06 ENCOUNTER — Ambulatory Visit (INDEPENDENT_AMBULATORY_CARE_PROVIDER_SITE_OTHER): Payer: 59 | Admitting: Physician Assistant

## 2023-11-06 ENCOUNTER — Encounter: Payer: Self-pay | Admitting: Physician Assistant

## 2023-11-06 VITALS — BP 132/70 | HR 72 | Resp 16 | Ht 70.0 in | Wt 160.0 lb

## 2023-11-06 DIAGNOSIS — F339 Major depressive disorder, recurrent, unspecified: Secondary | ICD-10-CM | POA: Diagnosis not present

## 2023-11-06 DIAGNOSIS — F5101 Primary insomnia: Secondary | ICD-10-CM | POA: Diagnosis not present

## 2023-11-06 DIAGNOSIS — Z636 Dependent relative needing care at home: Secondary | ICD-10-CM | POA: Diagnosis not present

## 2023-11-06 MED ORDER — DULOXETINE HCL 30 MG PO CPEP: 30.0000 mg | ORAL_CAPSULE | Freq: Every day | ORAL | 1 refills | Status: DC

## 2023-11-06 NOTE — Assessment & Plan Note (Signed)
 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

## 2023-11-06 NOTE — Progress Notes (Signed)
 Established Patient Office Visit  Name: Manuel [name]   MRN: [id number]    DOB: 1956-04-28   Date:11/06/2023  Today's Provider: [place], MHS, PA-C Introduced myself to the patient as a PA-C and provided education on APPs in clinical practice.         Subjective  Chief Complaint  Chief Complaint  Patient presents with   Depression   Insomnia    HPI   Depression Previously started on Duloxetine  30 mg PO every day for mood and muscle aches- he reports he sometimes forgets to take it and administration is not always consistent He denies side effects at this time but has not noticed much benefit  He reports he is having difficulty with communicating with his wife- reports this wears him down at times  He is interested in starting therapy services and would like referral today    Insomnia  He reports sleep is inconsistent Has gotten a bit better since the holidays are over He reports he usually falls asleep without issue after working but has to get up early the next day Sometimes has sleep initiation issues and stays awake until early morning        11/06/2023    8:54 AM 09/16/2023    9:08 AM 06/05/2023   11:42 AM 04/16/2023   10:55 AM 08/05/2022    8:46 AM  Depression screen PHQ 2/9  Decreased Interest 0 0 0 0 0  Down, Depressed, Hopeless 1 0 0 0 0  PHQ - 2 Score 1 0 0 0 0  Altered sleeping 0 0  0 0  Tired, decreased energy 0 0  0 0  Change in appetite 0 0  0 0  Feeling bad or failure about yourself  0 0  0 0  Trouble concentrating 0 0  0 0  Moving slowly or fidgety/restless 0 0  0 0  Suicidal thoughts 0 0  0 0  PHQ-9 Score 1 0  0 0  Difficult doing work/chores  Not difficult at all Not difficult at all Not difficult at all Not difficult at all       10/04/2020    2:51 PM 06/04/2020    2:43 PM  GAD 7 : Generalized Anxiety Score  Nervous, Anxious, on Edge 0 0  Control/stop worrying 0 1  Worry too much - different things 1 1  Trouble relaxing 3 1   Restless 2 1  Easily annoyed or irritable 0 0  Afraid - awful might happen 0 1  Total GAD 7 Score 6 5  Anxiety Difficulty  Somewhat difficult          Patient Active Problem List   Diagnosis Date Noted   Advanced care planning/counseling discussion 09/16/2023   Caregiver stress 08/15/2021   Hx of colonic polyps    Polyp of sigmoid colon    Primary insomnia 10/31/2020   Chronic kidney disease, stage III (moderate) (HCC) 11/12/2018   Recurrent depression (HCC) 07/15/2018   Chronic tension-type headache, intractable 05/17/2018   Myofascial pain syndrome 05/17/2018   Chronic daily headache 04/12/2018   Coronary artery disease 01/11/2018   Aortic atherosclerosis (HCC) 01/11/2018   Pulmonary emphysema (HCC) 01/11/2018   Mass of left thigh 12/30/2017   Vitamin D  deficiency 01/14/2016   Vitamin B12 deficiency 01/14/2016   Renal lesion 11/13/2015   Chronic lower back pain 11/09/2015   BPH (benign prostatic hyperplasia) 11/01/2015   Elevated PSA 11/01/2015   Hyperlipidemia  11/01/2015   Bunion, left foot 11/01/2015   Sickle cell trait (HCC) 01/16/2015   Cervical spondylosis without myelopathy 01/09/2015   Carpal tunnel syndrome 01/09/2015   Rotator cuff syndrome of left shoulder 09/18/2014   LVH (left ventricular hypertrophy) 08/18/2014   Current smoker 10/03/2013    Past Surgical History:  Procedure Laterality Date   CARPAL TUNNEL RELEASE Left    COLONOSCOPY WITH PROPOFOL  N/A 12/25/2015   Procedure: COLONOSCOPY WITH PROPOFOL ;  Surgeon: Rogelia Copping, MD;  Location: [facility];  Service: Endoscopy;  Laterality: N/A;   COLONOSCOPY WITH PROPOFOL  N/A 05/02/2021   Procedure: COLONOSCOPY WITH PROPOFOL ;  Surgeon: Copping Rogelia, MD;  Location: [facility];  Service: Endoscopy;  Laterality: N/A;   CYSTOURETHROSCOPY  08/25/12   with ureteral cathization w/wo retrograde pyelogram   HOLEP-LASER ENUCLEATION OF THE PROSTATE WITH MORCELLATION N/A 04/05/2021   Procedure:  HOLEP-LASER ENUCLEATION OF THE PROSTATE WITH MORCELLATION;  Surgeon: Francisca Redell BROCKS, MD;  Location: [facility] OR[facility];  Service: Urology;  Laterality: N/A;   POLYPECTOMY N/A 05/02/2021   Procedure: POLYPECTOMY;  Surgeon: Copping Rogelia, MD;  Location: [facility];  Service: Endoscopy;  Laterality: N/A;   ROTATOR CUFF REPAIR Right 01/25/15   SHOULDER ARTHROSCOPY WITH SUBACROMIAL DECOMPRESSION Right [id number]   TRANSURETHRAL RESECTION OF PROSTATE  08/25/12    Family History  Problem Relation Age of Onset   Aneurysm Mother    Hypertension Mother    Diabetes Father    Hypertension Father    Prostate cancer Father    Lupus Father    Cancer Maternal Aunt    Cancer Maternal Uncle    Diabetes Paternal Uncle    Heart disease Cousin    Prostate cancer Paternal Uncle    COPD Neg Hx    Stroke Neg Hx    Kidney disease Neg Hx     Social History   Tobacco Use   Smoking status: Every Day    Current packs/day: 1.00    Average packs/day: 1 pack/day for 35.0 years (35.0 ttl pk-yrs)    Types: Cigarettes   Smokeless tobacco: Never  Substance Use Topics   Alcohol use: No     Current Outpatient Medications:    albuterol  (VENTOLIN  HFA) 108 (90 Base) MCG/ACT inhaler, Inhale 2 puffs into the lungs every 6 (six) hours as needed for wheezing or shortness of breath., Disp: 8 g, Rfl: 0   benzonatate  (TESSALON ) 100 MG capsule, Take 1 capsule (100 mg total) by mouth 2 (two) times daily as needed for cough., Disp: 30 capsule, Rfl: 1   budesonide -formoterol  (SYMBICORT ) 160-4.5 MCG/ACT inhaler, Inhale 2 puffs into the lungs 2 (two) times daily. And can use as rescue/PRN 2 puffs up to 4 times a day for wheeze or cough, max 12 puffs in 24 hours, Disp: 1 each, Rfl: 3   Cholecalciferol (VITAMIN D3 PO), Take by mouth., Disp: , Rfl:    diphenhydramine-acetaminophen  (TYLENOL  PM) 25-500 MG TABS tablet, Take 2 tablets by mouth at bedtime as needed (pain)., Disp: , Rfl:    naproxen  (NAPROSYN ) 500 MG tablet, Take 1 tablet  (500 mg total) by mouth 2 (two) times daily as needed for moderate pain., Disp: 60 tablet, Rfl: 0   rosuvastatin  (CRESTOR ) 20 MG tablet, Take 1 tablet (20 mg total) by mouth at bedtime., Disp: 90 tablet, Rfl: 1   sildenafil  (REVATIO ) 20 MG tablet, Take 3 to 5 tablets two hours before intercouse on an empty stomach.  Do not take with nitrates., Disp: 30 tablet, Rfl: 3  vitamin B-12 (CYANOCOBALAMIN ) 500 MCG tablet, Take 2 tablets (1,000 mcg total) by mouth daily., Disp: 30 tablet, Rfl: 11   Vitamin D , Ergocalciferol , (DRISDOL ) 1.25 MG (50000 UNIT) CAPS capsule, Take 1 capsule (50,000 Units total) by mouth every 7 (seven) days., Disp: 12 capsule, Rfl: 0   Baclofen  5 MG TABS, Take 1-2 tablets (5-10 mg total) by mouth 3 (three) times daily as needed (msk pain or spasms). (Patient not taking: Reported on 11/06/2023), Disp: 60 tablet, Rfl: 0   DULoxetine  (CYMBALTA ) 30 MG capsule, Take 1 capsule (30 mg total) by mouth daily., Disp: 30 capsule, Rfl: 1  Allergies  Allergen Reactions   Penicillin G Other (See Comments)    Childhood    I personally reviewed active problem list, medication list, allergies, health maintenance, notes from last encounter with the patient/caregiver today.   Review of Systems  Cardiovascular:  Negative for chest pain and palpitations.  Psychiatric/Behavioral:  Positive for depression. Negative for memory loss, substance abuse and suicidal ideas. The patient is nervous/anxious and has insomnia.       Objective  Vitals:   11/06/23 0852  BP: 132/70  Pulse: 72  Resp: 16  SpO2: 98%  Weight: 160 lb (72.6 kg)  Height: 5' 10 (1.778 m)    Body mass index is 22.96 kg/m.  Physical Exam Vitals reviewed.  Constitutional:      General: He is awake.     Appearance: Normal appearance. He is well-developed and well-groomed.  HENT:     Head: Normocephalic and atraumatic.  Pulmonary:     Effort: Pulmonary effort is normal.  Skin:    General: Skin is warm and dry.   Neurological:     Mental Status: He is alert.  Psychiatric:        Attention and Perception: Attention and perception normal.        Mood and Affect: Mood and affect normal.        Speech: Speech normal.        Behavior: Behavior normal. Behavior is cooperative.        Thought Content: Thought content normal.        Judgment: Judgment normal.      Recent Results (from the past 2160 hours)  Urine cytology ancillary only     Status: None   Collection Time: 09/16/23 10:12 AM  Result Value Ref Range   Neisseria Gonorrhea Negative    Chlamydia Negative    Comment Normal Reference Ranger Chlamydia - Negative    Comment      Normal Reference Range Neisseria Gonorrhea - Negative  TSH     Status: None   Collection Time: 09/16/23 10:15 AM  Result Value Ref Range   TSH 0.95 0.40 - 4.50 mIU/L  Hemoglobin A1c     Status: Abnormal   Collection Time: 09/16/23 10:15 AM  Result Value Ref Range   Hgb A1c MFr Bld 6.0 (H) <5.7 % of total Hgb    Comment: For someone without known diabetes, a hemoglobin  A1c value between 5.7% and 6.4% is consistent with prediabetes and should be confirmed with a  follow-up test. . For someone with known diabetes, a value <7% indicates that their diabetes is well controlled. A1c targets should be individualized based on duration of diabetes, age, comorbid conditions, and other considerations. . This assay result is consistent with an increased risk of diabetes. . Currently, no consensus exists regarding use of hemoglobin A1c for diagnosis of diabetes for children. SABRA  Mean Plasma Glucose 126 mg/dL   eAG (mmol/L) 7.0 mmol/L  Lipid panel     Status: Abnormal   Collection Time: 09/16/23 10:15 AM  Result Value Ref Range   Cholesterol 193 <200 mg/dL   HDL 45 > OR = 40 mg/dL   Triglycerides 879 <849 mg/dL   LDL Cholesterol (Calc) 125 (H) mg/dL (calc)    Comment: Reference range: <100 . Desirable range <100 mg/dL for primary prevention;   <70 mg/dL  for patients with CHD or diabetic patients  with > or = 2 CHD risk factors. SABRA LDL-C is now calculated using the Martin-Hopkins  calculation, which is a validated novel method providing  better accuracy than the Friedewald equation in the  estimation of LDL-C.  Gladis APPLETHWAITE et al. SANDREA. 7986;689(80): 2061-2068  (http://education.[facility].com/faq/FAQ164)    Total CHOL/HDL Ratio 4.3 <5.0 (calc)   Non-HDL Cholesterol (Calc) 148 (H) <130 mg/dL (calc)    Comment: For patients with diabetes plus 1 major ASCVD risk  factor, treating to a non-HDL-C goal of <100 mg/dL  (LDL-C of <29 mg/dL) is considered a therapeutic  option.   CBC with Differential/Platelet     Status: Abnormal   Collection Time: 09/16/23 10:15 AM  Result Value Ref Range   WBC 5.2 3.8 - 10.8 Thousand/uL   RBC 5.86 (H) 4.20 - 5.80 Million/uL   Hemoglobin 14.5 13.2 - 17.1 g/dL   HCT 55.4 61.4 - 49.9 %   MCV 75.9 (L) 80.0 - 100.0 fL   MCH 24.7 (L) 27.0 - 33.0 pg   MCHC 32.6 32.0 - 36.0 g/dL    Comment: For adults, a slight decrease in the calculated MCHC value (in the range of 30 to 32 g/dL) is most likely not clinically significant; however, it should be interpreted with caution in correlation with other red cell parameters and the patient's clinical condition.    RDW 14.0 11.0 - 15.0 %   Platelets 277 140 - 400 Thousand/uL   MPV 10.0 7.5 - 12.5 fL   Neutro Abs 2,865 1,500 - 7,800 cells/uL   Absolute Lymphocytes 1,815 850 - 3,900 cells/uL   Absolute Monocytes 400 200 - 950 cells/uL   Eosinophils Absolute 88 15 - 500 cells/uL   Basophils Absolute 31 0 - 200 cells/uL   Neutrophils Relative % 55.1 %   Total Lymphocyte 34.9 %   Monocytes Relative 7.7 %   Eosinophils Relative 1.7 %   Basophils Relative 0.6 %  COMPLETE METABOLIC PANEL WITH GFR     Status: Abnormal   Collection Time: 09/16/23 10:15 AM  Result Value Ref Range   Glucose, Bld 73 65 - 99 mg/dL    Comment: .            Fasting reference  interval .    BUN 13 7 - 25 mg/dL   Creat 8.54 (H) 9.29 - 1.35 mg/dL   eGFR 53 (L) > OR = 60 mL/min/1.5m2   BUN/Creatinine Ratio 9 6 - 22 (calc)   Sodium 141 135 - 146 mmol/L   Potassium 4.5 3.5 - 5.3 mmol/L   Chloride 103 98 - 110 mmol/L   CO2 31 20 - 32 mmol/L   Calcium  9.5 8.6 - 10.3 mg/dL   Total Protein 6.9 6.1 - 8.1 g/dL   Albumin 4.0 3.6 - 5.1 g/dL   Globulin 2.9 1.9 - 3.7 g/dL (calc)   AG Ratio 1.4 1.0 - 2.5 (calc)   Total Bilirubin 0.3 0.2 - 1.2 mg/dL   Alkaline phosphatase (  APISO) 69 35 - 144 U/L   AST 16 10 - 35 U/L   ALT 10 9 - 46 U/L  PSA     Status: None   Collection Time: 09/16/23 10:15 AM  Result Value Ref Range   PSA 1.35 < OR = 4.00 ng/mL    Comment: The total PSA value from this assay system is  standardized against the WHO standard. The test  result will be approximately 20% lower when compared  to the equimolar-standardized total PSA (Beckman  Coulter). Comparison of serial PSA results should be  interpreted with this fact in mind. . This test was performed using the Siemens  chemiluminescent method. Values obtained from  different assay methods cannot be used interchangeably. PSA levels, regardless of value, should not be interpreted as absolute evidence of the presence or absence of disease.   HIV antibody (with reflex)     Status: None   Collection Time: 09/16/23 10:15 AM  Result Value Ref Range   HIV 1&2 Ab, 4th Generation NON-REACTIVE NON-REACTIVE    Comment: HIV-1 antigen and HIV-1/HIV-2 antibodies were not detected. There is no laboratory evidence of HIV infection. SABRA PLEASE NOTE: This information has been disclosed to you from records whose confidentiality may be protected by state law.  If your state requires such protection, then the state law prohibits you from making any further disclosure of the information without the specific written consent of the person to whom it pertains, or as otherwise permitted by law. A general  authorization for the release of medical or other information is NOT sufficient for this purpose. . For additional information please refer to http://education.[facility].com/faq/FAQ106 (This link is being provided for informational/ educational purposes only.) . SABRA The performance of this assay has not been clinically validated in patients less than 36 years old. .   RPR     Status: None   Collection Time: 09/16/23 10:15 AM  Result Value Ref Range   RPR Ser Ql NON-REACTIVE NON-REACTIVE    Comment: . No laboratory evidence of syphilis. If recent exposure is suspected, submit a new sample in 2-4 weeks. .   VITAMIN D  25 Hydroxy (Vit-D Deficiency, Fractures)     Status: Abnormal   Collection Time: 09/16/23 10:15 AM  Result Value Ref Range   Vit D, 25-Hydroxy 22 (L) 30 - 100 ng/mL    Comment: Vitamin D  Status         25-OH Vitamin D : . Deficiency:                    <20 ng/mL Insufficiency:             20 - 29 ng/mL Optimal:                 > or = 30 ng/mL . For 25-OH Vitamin D  testing on patients on  D2-supplementation and patients for whom quantitation  of D2 and D3 fractions is required, the [facility]ureD(TM) 25-OH VIT D, (D2,D3), LC/MS/MS is recommended: order  code 07111 (patients >94yrs). . See Note 1 . Note 1 . For additional information, please refer to  http://education.[facility].com/faq/FAQ199  (This link is being provided for informational/ educational purposes only.)   Vitamin B12     Status: None   Collection Time: 09/16/23 10:15 AM  Result Value Ref Range   Vitamin B-12 273 200 - 1,100 pg/mL    Comment: . Please Note: Although the reference range for vitamin B12 is [phone] pg/mL, it has been reported  that between 5 and 10% of patients with values between 200 and 400 pg/mL may experience neuropsychiatric and hematologic abnormalities due to occult B12 deficiency; less than 1% of patients with values above 400 pg/mL will have symptoms. .       PHQ2/9:    11/06/2023    8:54 AM 09/16/2023    9:08 AM 06/05/2023   11:42 AM 04/16/2023   10:55 AM 08/05/2022    8:46 AM  Depression screen PHQ 2/9  Decreased Interest 0 0 0 0 0  Down, Depressed, Hopeless 1 0 0 0 0  PHQ - 2 Score 1 0 0 0 0  Altered sleeping 0 0  0 0  Tired, decreased energy 0 0  0 0  Change in appetite 0 0  0 0  Feeling bad or failure about yourself  0 0  0 0  Trouble concentrating 0 0  0 0  Moving slowly or fidgety/restless 0 0  0 0  Suicidal thoughts 0 0  0 0  PHQ-9 Score 1 0  0 0  Difficult doing work/chores  Not difficult at all Not difficult at all Not difficult at all Not difficult at all      Fall Risk:    09/16/2023    9:08 AM 06/05/2023   11:36 AM 04/16/2023   10:55 AM 08/05/2022    8:46 AM 07/15/2022   10:05 AM  Fall Risk   Falls in the past year? 0 0 0 0 0  Number falls in past yr: 0 0 0 0 0  Injury with Fall? 0 0 0 0 0  Risk for fall due to : No Fall Risks No Fall Risks No Fall Risks No Fall Risks No Fall Risks  Follow up Falls prevention discussed;Education provided;Falls evaluation completed Education provided;Falls prevention discussed Falls prevention discussed;Education provided;Falls evaluation completed Falls prevention discussed;Education provided Falls prevention discussed;Education provided      Functional Status Survey:      Assessment & Plan  Problem List Items Addressed This Visit       Other   Recurrent depression (HCC) - Primary   Chronic, ongoing Reviewed PHQ9 which is minimal in scoring but he reports ongoing sadness and dysphoric mood surrounding 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  He has not been using Duloxetine  routinely- recommend he takes daily to provide full benefit  Will place referral to social worker for therapy services per his request Follow up in 3 months or sooner if concerns arise        Relevant Medications   DULoxetine  (CYMBALTA ) 30 MG capsule   Other Relevant Orders   Ambulatory referral to  Social Work   Primary insomnia   Chronic,intermittent  He reports some issues with sleep initiation at times  Reviewed sleep hygiene- supplemental information provided in AVS and recommend adding melatonin for sleep aid  May need to try Trazodone  if melatonin is not beneficial. Hoping with more consistent use of Duloxetine  he will have less anxiety and depression symptoms and thus improved sleep Follow up in 3 months or sooner if concerns arise        Caregiver stress   Chronic, ongoing Referral placed to LCSW for counseling  He was started on Duloxetine  30 mg PO every day but he is not taking consistently right now Recommend improving Duloxetine  admin and will see if therapy augments current regimen Follow up in 3 months or sooner if concerns arise        Relevant Medications   DULoxetine  (  CYMBALTA ) 30 MG capsule   Other Relevant Orders   Ambulatory referral to Social Work     Return in about 3 months (around 02/04/2024) for Depression, insomnia, .   I, Kiron Osmun E Haldon Carley, PA-C, have reviewed all documentation for this visit. The documentation on 11/06/23 for the exam, diagnosis, procedures, and orders are all accurate and complete.   [place], MHS, PA-C [facility]

## 2023-11-06 NOTE — Patient Instructions (Addendum)
 Sleep hygiene (Practices to help improve sleep)  *Try to go to bed at the same time every night *Make your room as dark and quiet as possible for sleep (ambient noises or sleep machine is okay too) *Try to establish a bedtime routine before bed (shower, reading for a little bit, etc.) *No TVs or phone for at least to an hour before bed. (The blue light from these devices can affect your sleep-wake cycle and make it harder to go to sleep) *Avoid caffeine in the afternoon and especially before bedtime.  The goal is to establish a relaxing environment and routine that signals your body that it is time to go to sleep.   I also recommend trying Melatonin 1-3 mg at bedtime if you are having trouble falling asleep. This is available over the counter in several different forms   Please make sure you are taking the Duloxetine  once per day, every day to make sure it is able to reach therapeutic effect and provide some mood relief   You should receive a call in the next week or so to set up counseling services so please be on the look  out for that call. If you don't hear anything in 2 weeks please call us  so we can look into it.

## 2023-11-06 NOTE — Assessment & Plan Note (Signed)
 Chronic, ongoing Referral placed to LCSW for counseling  He was started on Duloxetine  30 mg PO every day but he is not taking consistently right now Recommend improving Duloxetine  admin and will see if therapy augments current regimen Follow up in 3 months or sooner if concerns arise

## 2023-11-06 NOTE — Assessment & Plan Note (Signed)
 Chronic,intermittent  He reports some issues with sleep initiation at times  Reviewed sleep hygiene- supplemental information provided in AVS and recommend adding melatonin for sleep aid  May need to try Trazodone  if melatonin is not beneficial. Hoping with more consistent use of Duloxetine  he will have less anxiety and depression symptoms and thus improved sleep Follow up in 3 months or sooner if concerns arise

## 2023-11-09 ENCOUNTER — Telehealth: Payer: Self-pay | Admitting: *Deleted

## 2023-11-09 NOTE — Progress Notes (Signed)
 Complex Care Management Note Care Guide Note  11/09/2023 Name: Manuel [name] MRN: [id number] DOB: Mar 22, 1956   Complex Care Management Outreach Attempts: An unsuccessful telephone outreach was attempted today to offer the patient information about available complex care management services.  Follow Up Plan:  Additional outreach attempts will be made to offer the patient complex care management information and services.   Encounter Outcome:  No Answer  Thedford Franks, CCMA Care Coordination Care Guide Direct Dial: [phone]

## 2023-11-10 DIAGNOSIS — R0789 Other chest pain: Secondary | ICD-10-CM | POA: Diagnosis not present

## 2023-11-10 NOTE — Progress Notes (Signed)
 Complex Care Management Note Care Guide Note  11/10/2023 Name: Tri Chittick MRN: [id number] DOB: Dec 08, 1955   Complex Care Management Outreach Attempts: A second unsuccessful outreach was attempted today to offer the patient with information about available complex care management services.  Follow Up Plan:  Additional outreach attempts will be made to offer the patient complex care management information and services.   Encounter Outcome:  No Answer  Thedford Franks, CCMA Care Coordination Care Guide Direct Dial: [phone]

## 2023-11-11 NOTE — Progress Notes (Signed)
 Complex Care Management Note Care Guide Note  11/11/2023 Name: Manuel [name] MRN: [id number] DOB: Jan 02, 1956   Complex Care Management Outreach Attempts: A third unsuccessful outreach was attempted today to offer the patient with information about available complex care management services.  Follow Up Plan:  No further outreach attempts will be made at this time. We have been unable to contact the patient to offer or enroll patient in complex care management services.  Encounter Outcome:  No Answer  Thedford Franks, CCMA Care Coordination Care Guide Direct Dial: [phone]

## 2023-11-13 ENCOUNTER — Telehealth: Payer: Self-pay | Admitting: *Deleted

## 2023-11-13 NOTE — Progress Notes (Signed)
 Complex Care Management Note  Care Guide Note 11/13/2023 Name: Manuel [name] MRN: [id number] DOB: Feb 09, 1956  Manuel [name] is a 68 y.o. year old male who sees Tapia, Leisa, PA-C for primary care. I reached out to Manuel [name] by phone today to offer complex care management services.  Manuel [name] was given information about Complex Care Management services today including:   The Complex Care Management services include support from the care team which includes your Nurse Coordinator, Clinical Social Worker, or Pharmacist.  The Complex Care Management team is here to help remove barriers to the health concerns and goals most important to you. Complex Care Management services are voluntary, and the patient may decline or stop services at any time by request to their care team member.   Complex Care Management Consent Status: Patient agreed to services and verbal consent obtained.   Follow up plan:  Telephone appointment with complex care management team member scheduled for:  11/23/2023  Encounter Outcome:  Patient Scheduled  Manuel [name], [facility] Care Coordination Care Guide Direct Dial: [phone]

## 2023-11-23 ENCOUNTER — Telehealth: Payer: Self-pay | Admitting: *Deleted

## 2023-11-23 ENCOUNTER — Ambulatory Visit: Payer: Self-pay | Admitting: *Deleted

## 2023-11-23 NOTE — Patient Outreach (Signed)
  Care Coordination   11/23/2023 Name: Glade San MRN: [id number] DOB: Jul 29, 1956   Care Coordination Outreach Attempts:  An unsuccessful telephone outreach was attempted today to offer the patient information about available complex care management services.  Follow Up Plan:  Additional outreach attempts will be made to offer the patient complex care management information and services.   Encounter Outcome:  No Answer   Care Coordination Interventions:  No, not indicated    Nazaret Chea, LCSW [place]  [facility], [facility] Licensed Clinical Social Worker Care Coordinator  Direct Dial: [phone]

## 2023-11-23 NOTE — Patient Outreach (Signed)
  Care Coordination   Initial Visit Note   11/23/2023 Name: Manuel [name] MRN: [id number] DOB: 01-Oct-1956  Manuel [name] is a 68 y.o. year old male who sees Danelle Berry, [place] for primary care. I spoke with  Manuel [name] by phone today.  What matters to the patients health and wellness today?  Ongoing mental health follow up related to caregiver stress and financial challenges.    Goals Addressed             This Visit's Progress    Ongoing mental health follow up       Activities and task to complete in order to accomplish goals.   EMOTIONAL / MENTAL HEALTH SUPPORT Keep all upcoming appointment discussed today Self Support options  (continue to utilize coping strategies discussed today, priortizing self care and boundary setting) CSW will follow up with referral for ongoing mental health counseling with [facility] or [facility] outpatient.            SDOH assessments and interventions completed:  Yes  SDOH Interventions Today    Flowsheet Row Most Recent Value  SDOH Interventions   Food Insecurity Interventions Intervention Not Indicated  Housing Interventions Intervention Not Indicated  Transportation Interventions --  [reports that family assists with transportation to medical appts]  Utilities Interventions Intervention Not Indicated  [U-card $650.00-helps with utilities]  Stress Interventions Provide Counseling  Social Connections Interventions Intervention Not Indicated        Care Coordination Interventions:  Yes, provided  Interventions Today    Flowsheet Row Most Recent Value  Chronic Disease   Chronic disease during today's visit Other  [depression, caregiver stress]  General Interventions   General Interventions Discussed/Reviewed General Interventions Discussed, Investment banker, operational of current community resource needs related to   ongoing mental health follow up-confirms receiving help with utilities through the  [employer], also receives a U-card]  Education Interventions   Education Provided Provided Education  Provided Verbal Education On Mental Health/Coping with Illness  Mental Health Interventions   Mental Health Discussed/Reviewed Mental Health Discussed, Depression, Grief and Loss, Coping Strategies  [MH needs assessed-caregiver stress acknowledged as well as challenges managing grief and loss-self care encouraged as well as boundary setting with famly/friends]  Safety Interventions   Safety Discussed/Reviewed Safety Discussed  [patient denties thoughts of harm to self or others]       Follow up plan: Follow up call scheduled for 12/07/23    Encounter Outcome:  Patient Visit Completed

## 2023-11-23 NOTE — Patient Instructions (Signed)
Visit Information  Thank you for taking time to visit with me today. Please don't hesitate to contact me if I can be of assistance to you.   Following are the goals we discussed today:   Goals Addressed             This Visit's Progress    Ongoing mental health follow up       Activities and task to complete in order to accomplish goals.   EMOTIONAL / MENTAL HEALTH SUPPORT Keep all upcoming appointment discussed today Self Support options  (continue to utilize coping strategies discussed today, priortizing self care and boundary setting) CSW will follow up with referral for ongoing mental health counseling with [facility] or [facility] outpatient.            Our next appointment is by telephone on 12/07/23 at 9am  Please call the care guide team at [phone] if you need to cancel or reschedule your appointment.   If you are experiencing a Mental Health or Behavioral Health Crisis or need someone to talk to, please call the Suicide and Crisis Lifeline: 988   Patient verbalizes understanding of instructions and care plan provided today and agrees to view in MyChart. Active MyChart status and patient understanding of how to access instructions and care plan via MyChart confirmed with patient.     Telephone follow up appointment with care management team member scheduled for: 12/07/23  Verna Czech, LCSW [place]  [facility], [facility] Licensed Clinical Social Worker Care Coordinator  Direct Dial: [phone]

## 2023-12-07 ENCOUNTER — Telehealth: Payer: Self-pay | Admitting: *Deleted

## 2023-12-07 ENCOUNTER — Encounter: Payer: Self-pay | Admitting: *Deleted

## 2023-12-07 NOTE — Patient Outreach (Signed)
  Care Coordination   12/07/2023 Name: Manuel [name] MRN: [id number] DOB: 06-27-1956   Care Coordination Outreach Attempts:  An unsuccessful telephone outreach was attempted today to offer the patient information about available complex care management services.  Follow Up Plan:  Additional outreach attempts will be made to offer the patient complex care management information and services.   Encounter Outcome:  No Answer   Care Coordination Interventions:  No, not indicated      Yarnell Kozloski, LCSW [place]  [facility], [facility] Licensed Clinical Social Worker Care Coordinator  Direct Dial: [phone]

## 2024-01-06 DIAGNOSIS — Z720 Tobacco use: Secondary | ICD-10-CM | POA: Diagnosis not present

## 2024-01-06 DIAGNOSIS — R319 Hematuria, unspecified: Secondary | ICD-10-CM | POA: Diagnosis not present

## 2024-01-06 DIAGNOSIS — N1831 Chronic kidney disease, stage 3a: Secondary | ICD-10-CM | POA: Diagnosis not present

## 2024-01-06 DIAGNOSIS — I129 Hypertensive chronic kidney disease with stage 1 through stage 4 chronic kidney disease, or unspecified chronic kidney disease: Secondary | ICD-10-CM | POA: Diagnosis not present

## 2024-01-17 NOTE — Progress Notes (Deleted)
 01/19/2024  10:44 PM   [name] Aug 18, 1956 [id number]  Referring provider: Danelle Berry, PA-C [address] [place],  [place] [address]   Urological history: 1. Elevated PSA -PSA pending  -PSA (03/2019) 5.1  -Prostate MRI on 07/25/2019 revealed a small PI-RADS category 4 lesion in the right posterolateral peripheral zone of the prostate apex is present. Targeting data sent to [facility].  Considerable benign prostatic hypertrophy, prostate volume 78.53 cubic cm.  Thin peripheral zone with generalized low T2 signal, probably postinflammatory.  Sigmoid colon diverticulosis. 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 BPH with LU TS -s/p HoLEP 04/2021  3. High risk hematuria -smoker -work up in 2013.  He underwent a CT Urogram, cystoscopy with bilateral retrogrades and ultmately a TURP with Dr. Carin Primrose at [facility].  No malignancies were discovered.  Hematuria was from his enlarged, friable prostate.  He has not had any gross hematuria since that time.  A too small to characterize lesion was seen in the left midpole of the kidney on the CT Urogram in 2013.   RUS 05/2019 Increased echogenicity in the cortex of both kidneys is consistent with medical renal disease. No other acute abnormalities identified -cysto 10/2019 no malignancy found  -CTU 2022 and cysto 2022 negative for malignancy  4. ED -contributing factors of age, smoking, BPH, CAD, HLD, depression and antidepressents -sildenafil 20 mg, 3 to 5 tablets prior to intercourse  No chief complaint on file.   HPI: Manuel [name] is a 68 y.o.  male who presents today for a 12 month follow up.  Previous records reviewed.     I PSS ***       Score:  1-7 Mild 8-19 Moderate 20-35 Severe  SHIM ***      Score: 1-7 Severe ED 8-11 Moderate ED 12-16 Mild-Moderate ED 17-21 Mild ED 22-25 No ED   PMH: Past Medical History:  Diagnosis  Date   Anemia    Anxiety    Aortic atherosclerosis (HCC) 01/11/2018   CT scan Feb 2019   Asthma    Bilateral carpal tunnel syndrome    Bilateral hand numbness    BPH without urinary obstruction    Cervical spondylosis without myelopathy    Chronic kidney disease    STAGE 3   Complete tear of rotator cuff    bilateral   COPD (chronic obstructive pulmonary disease) (HCC)    Depression    Emphysema lung (HCC) 01/11/2018   Chest CT Feb 2019   History of stomach ulcers    HLD (hyperlipidemia)    LVH (left ventricular hypertrophy)    Personal history of tobacco use, presenting hazards to health 12/21/2015   Poor dentition    Sickle cell trait (HCC)    Sleep apnea    Smoker     Surgical History: Past Surgical History:  Procedure Laterality Date   CARPAL TUNNEL RELEASE Left    COLONOSCOPY WITH PROPOFOL N/A 12/25/2015   Procedure: COLONOSCOPY WITH PROPOFOL;  Surgeon: Midge Minium, MD;  Location: [facility];  Service: Endoscopy;  Laterality: N/A;   COLONOSCOPY WITH PROPOFOL N/A 05/02/2021   Procedure: COLONOSCOPY WITH PROPOFOL;  Surgeon: Midge Minium, MD;  Location: [facility];  Service: Endoscopy;  Laterality: N/A;   CYSTOURETHROSCOPY  08/25/12   with ureteral cathization w/wo retrograde pyelogram   HOLEP-LASER ENUCLEATION OF THE PROSTATE WITH MORCELLATION N/A 04/05/2021   Procedure: HOLEP-LASER ENUCLEATION OF THE PROSTATE WITH MORCELLATION;  Surgeon: Sondra Come, MD;  Location: [facility] ORS;  Service: Urology;  Laterality: N/A;   POLYPECTOMY N/A 05/02/2021   Procedure: POLYPECTOMY;  Surgeon: Midge Minium, MD;  Location: [facility];  Service: Endoscopy;  Laterality: N/A;   ROTATOR CUFF REPAIR Right 01/25/15   SHOULDER ARTHROSCOPY WITH SUBACROMIAL DECOMPRESSION Right [id number]   TRANSURETHRAL RESECTION OF PROSTATE  08/25/12    Home Medications:  Allergies as of 01/19/2024       Reactions   Penicillin G Other (See Comments)   Childhood        Medication List         Accurate as of January 17, 2024 10:44 PM. If you have any questions, ask your nurse or doctor.          albuterol 108 (90 Base) MCG/ACT inhaler Commonly known as: VENTOLIN HFA Inhale 2 puffs into the lungs every 6 (six) hours as needed for wheezing or shortness of breath.   Baclofen 5 MG Tabs Take 1-2 tablets (5-10 mg total) by mouth 3 (three) times daily as needed (msk pain or spasms).   benzonatate 100 MG capsule Commonly known as: TESSALON Take 1 capsule (100 mg total) by mouth 2 (two) times daily as needed for cough.   budesonide-formoterol 160-4.5 MCG/ACT inhaler Commonly known as: SYMBICORT Inhale 2 puffs into the lungs 2 (two) times daily. And can use as rescue/PRN 2 puffs up to 4 times a day for wheeze or cough, max 12 puffs in 24 hours   diphenhydramine-acetaminophen 25-500 MG Tabs tablet Commonly known as: TYLENOL PM Take 2 tablets by mouth at bedtime as needed (pain).   DULoxetine 30 MG capsule Commonly known as: Cymbalta Take 1 capsule (30 mg total) by mouth daily.   naproxen 500 MG tablet Commonly known as: NAPROSYN Take 1 tablet (500 mg total) by mouth 2 (two) times daily as needed for moderate pain.   rosuvastatin 20 MG tablet Commonly known as: Crestor Take 1 tablet (20 mg total) by mouth at bedtime.   sildenafil 20 MG tablet Commonly known as: REVATIO Take 3 to 5 tablets two hours before intercouse on an empty stomach.  Do not take with nitrates.   vitamin B-12 500 MCG tablet Commonly known as: CYANOCOBALAMIN Take 2 tablets (1,000 mcg total) by mouth daily.   Vitamin D (Ergocalciferol) 1.25 MG (50000 UNIT) Caps capsule Commonly known as: DRISDOL Take 1 capsule (50,000 Units total) by mouth every 7 (seven) days.   VITAMIN D3 PO Take by mouth.        Allergies:  Allergies  Allergen Reactions   Penicillin G Other (See Comments)    Childhood    Family History: Family History  Problem Relation Age of Onset   Aneurysm Mother     Hypertension Mother    Diabetes Father    Hypertension Father    Prostate cancer Father    Lupus Father    Cancer Maternal Aunt    Cancer Maternal Uncle    Diabetes Paternal Uncle    Heart disease Cousin    Prostate cancer Paternal Uncle    COPD Neg Hx    Stroke Neg Hx    Kidney disease Neg Hx     Social History:  reports that he has been smoking cigarettes. He has a 35 pack-year smoking history. He has never used smokeless tobacco. He reports that he does not drink alcohol and does not use drugs.  ROS: For pertinent review of systems please refer to history of present  illness  Physical Exam: There were no vitals taken for this visit.  Constitutional:  Well nourished. Alert and oriented, No acute distress. HEENT: [place] AT, moist mucus membranes.  Trachea midline, no masses. Cardiovascular: No clubbing, cyanosis, or edema. Respiratory: Normal respiratory effort, no increased work of breathing. GI: Abdomen is soft, non tender, non distended, no abdominal masses. Liver and spleen not palpable.  No hernias appreciated.  Stool sample for occult testing is not indicated.   GU: No CVA tenderness.  No bladder fullness or masses.  Patient with circumcised/uncircumcised phallus. ***Foreskin easily retracted***  Urethral meatus is patent.  No penile discharge. No penile lesions or rashes. Scrotum without lesions, cysts, rashes and/or edema.  Testicles are located scrotally bilaterally. No masses are appreciated in the testicles. Left and right epididymis are normal. Rectal: Patient with  normal sphincter tone. Anus and perineum without scarring or rashes. No rectal masses are appreciated. Prostate is approximately *** grams, *** nodules are appreciated. Seminal vesicles are normal. Skin: No rashes, bruises or suspicious lesions. Lymph: No cervical or inguinal adenopathy. Neurologic: Grossly intact, no focal deficits, moving all 4 extremities. Psychiatric: Normal mood and affect.   Laboratory  Data: Renal Function Panel Order: [id number] Component Ref Range & Units 11 d ago  Glucose 65 - 99 mg/dL 84  Comment:                                                                                             Fasting reference interval                                           BUN 7 - 25 mg/dL 15  Creatinine 8.65 - 7.84 mg/dL 6.96 High   eGFR CKD-EPI CR 2021 > OR = 60 mL/min/1.13m2 54 Low   BUN/Creatinine Ratio 6 - 22 (calc) 10  Sodium 135 - 146 mmol/L 139  Potassium 3.5 - 5.3 mmol/L 4.5  Chloride 98 - 110 mmol/L 103  Bicarbonate (CO2) 20 - 32 mmol/L 31  Calcium 8.6 - 10.3 mg/dL 9.1  Phosphorus 2.1 - 4.3 mg/dL 3.5  Albumin 3.6 - 5.1 g/dL 4  Resulting Agency See order comments   Specimen Collected: 01/06/24 11:11   Performed by: Chancy Hurter Last Resulted: 01/07/24 11:18  Received From: [facility]  Result Received: 01/07/24 13:14   CBC and Differential Order: [id number] Component Ref Range & Units 11 d ago  WBC 3.8 - 10.8 Thousand/uL 4.6  RBC 4.20 - 5.80 Million/uL 5.71  Hemoglobin 13.2 - 17.1 g/dL 44.0  Hematocrit 10.2 - 50.0 % 43.8  MCV 80.0 - 100.0 fL 76.7 Low   MCH 27.0 - 33.0 pg 25 Low   MCHC 32.0 - 36.0 g/dL 72.5  Comment:      For adults, a slight decrease in the calculated MCHC      value (in the range of 30 to 32 g/dL) is most likely      not clinically significant; however, it should be      interpreted with  caution in correlation with other      red cell parameters and the patient's clinical      condition.  RDW 11.0 - 15.0 % 14.3  Platelets 140 - 400 Thousand/uL 229  MPV 7.5 - 12.5 fL 9.9  Neutrophils Absolute 1500 - 7800 cells/uL 2,737  Band Neutrophils Absolute, Manual Count 0 - 750 cells/uL CANCELED  Comment: Result canceled by the ancillary.  Metamyelocytes Absolute 0 cells/uL CANCELED  Comment: Result canceled by the ancillary.  Absolute Myelocytes 0 cells/uL CANCELED  Comment: Result canceled by the ancillary.   Absolute Promyelocytes 0 cells/uL CANCELED  Comment: Result canceled by the ancillary.  Lymphocytes Absolute 850 - 3900 cells/uL 1,072  Monocytes Absolute 200 - 950 cells/uL 603  Eosinophils Absolute 15 - 500 cells/uL 170  Basophils Absolute 0 - 200 cells/uL 18  Blasts Absolute 0 cells/uL CANCELED  Comment: Result canceled by the ancillary.  NRBC Absolute 0 cells/uL CANCELED  Comment: Result canceled by the ancillary.  Neutrophils Relative % 59.5  Bands Absolute % CANCELED  Comment: Result canceled by the ancillary.  Metamyelocytes Percent % CANCELED  Comment: Result canceled by the ancillary.  Myelocytes Relative % CANCELED  Comment: Result canceled by the ancillary.  Promyelocytes Relative % CANCELED  Comment: Result canceled by the ancillary.  Lymphocytes % 23.3  Variant lymphocytes/100 WBC (Bld) 0 - 10 % CANCELED  Comment: Result canceled by the ancillary.  Monocytes % 13.1  Eosinophils % 3.7  Basophils Relative % 0.4  Blasts % CANCELED  Comment: Result canceled by the ancillary.  nRBC 0 /100 WBC CANCELED  Comment: Result canceled by the ancillary.  Comment(s) CANCELED  Comment: Result canceled by the ancillary.  Resulting Agency See order comments   Specimen Collected: 01/06/24 11:11   Performed by: Chancy Hurter Last Resulted: 01/07/24 11:18  Received From: [facility]  Result Received: 01/07/24 13:14   Urinalysis, Complete w/reflex to Culture Order: [id number] Component Ref Range & Units 11 d ago  Color, Urine YELLOW YELLOW  Appearance Urine CLEAR CLEAR  Specific Gravity, UA 1.001 - 1.035 1.012  pH Urine 5.0 - 8.0 5.5  Glucose, Ur NEGATIVE NEGATIVE  Bilirubin, Urine NEGATIVE NEGATIVE  Ketones, Urine NEGATIVE NEGATIVE  Hemoglobin Ur Ql Strip NEGATIVE NEGATIVE  Protein, Ur NEGATIVE NEGATIVE  Nitrite, Urine NEGATIVE NEGATIVE  WBC Esterase Urine NEGATIVE NEGATIVE  WBC, Urine < OR = 5 /HPF NONE SEEN  RBC, Urine < OR = 2  /HPF NONE SEEN  Epithelial Cells in Urine < OR = 5 /HPF NONE SEEN  Trans Epithelial, Urine < OR = 5 /HPF CANCELED  Comment: Result canceled by the ancillary.  Renal Epithelial Cells, Urine < OR = 3 /HPF CANCELED  Comment: Result canceled by the ancillary.  Bacteria NONE SEEN /HPF NONE SEEN  Calcium Oxalate Crystals, Urine NONE OR FEW /HPF CANCELED  Comment: Result canceled by the ancillary.  Triple Phosphate Crystals, Urine NONE OR FEW /HPF CANCELED  Comment: Result canceled by the ancillary.  Uric Acid Crystals, Urine NONE OR FEW /HPF CANCELED  Comment: Result canceled by the ancillary.  Amorphous Sediments NONE OR FEW /HPF CANCELED  Comment: Result canceled by the ancillary.  Crystals NONE SEEN /HPF CANCELED  Comment: Result canceled by the ancillary.  Hyaline Casts, Urine NONE SEEN /LPF 0-5 Abnormal   Granular Casts, Urine NONE SEEN /LPF CANCELED  Comment: Result canceled by the ancillary.  Casts NONE SEEN /LPF CANCELED  Comment: Result canceled by the ancillary.  Yeast, UA NONE SEEN /HPF CANCELED  Comment: Result canceled by the ancillary.  Note:   Comment:      This urine was analyzed for the presence of WBC,      RBC, bacteria, casts, and other formed elements.      Only those elements seen were reported.                Resulting Agency See order comments   Specimen Collected: 01/06/24 11:11   Performed by: Chancy Hurter Last Resulted: 01/07/24 11:18  Received From: [facility]  Result Received: 01/07/24 13:14  I have reviewed the labs.  Pertinent Imaging N/A   Assessment & Plan:    1. BPH with LU TS -PSA pending  -DRE benign*** -UA benign -PVR < 300 cc*** -symptoms - none  2. Erectile dysfunction -continue sildenafil 20 mg, on-demand-dosing  3. High risk hematuria -work up completed in 2013 and 2022 - positive for an enlarged prostate -no reports of gross hematuria -UA (01/2024) negative for micro heme  No follow-ups on  file.  Cloretta Ned [facility] [address] [place], [place] [address] [phone]

## 2024-01-18 ENCOUNTER — Other Ambulatory Visit: Payer: Self-pay

## 2024-01-18 ENCOUNTER — Other Ambulatory Visit: Payer: 59

## 2024-01-18 DIAGNOSIS — N138 Other obstructive and reflux uropathy: Secondary | ICD-10-CM

## 2024-01-19 ENCOUNTER — Ambulatory Visit: Payer: Self-pay | Admitting: Urology

## 2024-01-19 DIAGNOSIS — R319 Hematuria, unspecified: Secondary | ICD-10-CM

## 2024-01-19 DIAGNOSIS — N138 Other obstructive and reflux uropathy: Secondary | ICD-10-CM

## 2024-01-19 DIAGNOSIS — N529 Male erectile dysfunction, unspecified: Secondary | ICD-10-CM

## 2024-01-19 LAB — PSA: Prostate Specific Ag, Serum: 1.8 ng/mL (ref 0.0–4.0)

## 2024-01-20 NOTE — Progress Notes (Signed)
 01/22/2024  9:24 PM   [name] 1956/04/12 [id number]  Referring provider: Danelle Berry, PA-C [address] [place],  [place] [address]   Urological history: 1. Elevated PSA -PSA (01/2024) 1.8 -PSA (03/2019) 5.1  -Prostate MRI on 07/25/2019 revealed a small PI-RADS category 4 lesion in the right posterolateral peripheral zone of the prostate apex is present. Targeting data sent to [facility].  Considerable benign prostatic hypertrophy, prostate volume 78.53 cubic cm.  Thin peripheral zone with generalized low T2 signal, probably postinflammatory.  Sigmoid colon diverticulosis. 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 BPH with LU TS -s/p HoLEP 04/2021  3. High risk hematuria -smoker -work up in 2013.  He underwent a CT Urogram, cystoscopy with bilateral retrogrades and ultmately a TURP with Dr. Carin Primrose at [facility].  No malignancies were discovered.  Hematuria was from his enlarged, friable prostate.  He has not had any gross hematuria since that time.  A too small to characterize lesion was seen in the left midpole of the kidney on the CT Urogram in 2013.   RUS 05/2019 Increased echogenicity in the cortex of both kidneys is consistent with medical renal disease. No other acute abnormalities identified -cysto 10/2019 no malignancy found  -CTU 2022 and cysto 2022 negative for malignancy  4. ED -contributing factors of age, smoking, BPH, CAD, HLD, depression and antidepressents -sildenafil 20 mg, 3 to 5 tablets prior to intercourse  Chief Complaint  Patient presents with   Follow-up   Benign Prostatic Hypertrophy    HPI: Manuel [name] is a 68 y.o.  male who presents today for a 12 month follow up.  Previous records reviewed.     He is overwhelmed with taking care of his wife who has mental illness.    I PSS 0/1  He has no urinary complaints.  Patient denies any modifying or aggravating  factors.  Patient denies any recent UTI's, gross hematuria, dysuria or suprapubic/flank pain.  Patient denies any fevers, chills, nausea or vomiting.     IPSS     Row Name 01/22/24 0800         International Prostate Symptom Score   How often have you had the sensation of not emptying your bladder? Not at All     How often have you had to urinate less than every two hours? Not at All     How often have you found you stopped and started again several times when you urinated? Not at All     How often have you found it difficult to postpone urination? Not at All     How often have you had a weak urinary stream? Not at All     How often have you had to strain to start urination? Not at All     How many times did you typically get up at night to urinate? None     Total IPSS Score 0       Quality of Life due to urinary symptoms   If you were to spend the rest of your life with your urinary condition just the way it is now how would you feel about that? Pleased             Score:  1-7 Mild 8-19 Moderate 20-35 Severe  SHIM 13  He has not been sexually active as his wife is having issues her mental illness.   He is taking sildenafil  as needed and needs a refill.   Patient is not having spontaneous erections.  He denies any pain or curvature with erections.     SHIM     Row Name 01/22/24 [phone]         SHIM: Over the last 6 months:   How do you rate your confidence that you could get and keep an erection? Low     When you had erections with sexual stimulation, how often were your erections hard enough for penetration (entering your partner)? Sometimes (about half the time)     During sexual intercourse, how often were you able to maintain your erection after you had penetrated (entered) your partner? A Few Times (much less than half the time)     During sexual intercourse, how difficult was it to maintain your erection to completion of intercourse? Slightly Difficult     When you  attempted sexual intercourse, how often was it satisfactory for you? A Few Times (much less than half the time)       SHIM Total Score   SHIM 13                Score: 1-7 Severe ED 8-11 Moderate ED 12-16 Mild-Moderate ED 17-21 Mild ED 22-25 No ED   PMH: Past Medical History:  Diagnosis Date   Anemia    Anxiety    Aortic atherosclerosis (HCC) 01/11/2018   CT scan Feb 2019   Asthma    Bilateral carpal tunnel syndrome    Bilateral hand numbness    BPH without urinary obstruction    Cervical spondylosis without myelopathy    Chronic kidney disease    STAGE 3   Complete tear of rotator cuff    bilateral   COPD (chronic obstructive pulmonary disease) (HCC)    Depression    Emphysema lung (HCC) 01/11/2018   Chest CT Feb 2019   History of stomach ulcers    HLD (hyperlipidemia)    LVH (left ventricular hypertrophy)    Personal history of tobacco use, presenting hazards to health 12/21/2015   Poor dentition    Sickle cell trait (HCC)    Sleep apnea    Smoker     Surgical History: Past Surgical History:  Procedure Laterality Date   CARPAL TUNNEL RELEASE Left    COLONOSCOPY WITH PROPOFOL N/A 12/25/2015   Procedure: COLONOSCOPY WITH PROPOFOL;  Surgeon: Midge Minium, MD;  Location: [facility];  Service: Endoscopy;  Laterality: N/A;   COLONOSCOPY WITH PROPOFOL N/A 05/02/2021   Procedure: COLONOSCOPY WITH PROPOFOL;  Surgeon: Midge Minium, MD;  Location: [facility];  Service: Endoscopy;  Laterality: N/A;   CYSTOURETHROSCOPY  08/25/12   with ureteral cathization w/wo retrograde pyelogram   HOLEP-LASER ENUCLEATION OF THE PROSTATE WITH MORCELLATION N/A 04/05/2021   Procedure: HOLEP-LASER ENUCLEATION OF THE PROSTATE WITH MORCELLATION;  Surgeon: Sondra Come, MD;  Location: [facility];  Service: Urology;  Laterality: N/A;   POLYPECTOMY N/A 05/02/2021   Procedure: POLYPECTOMY;  Surgeon: Midge Minium, MD;  Location: [facility];  Service: Endoscopy;  Laterality:  N/A;   ROTATOR CUFF REPAIR Right 01/25/15   SHOULDER ARTHROSCOPY WITH SUBACROMIAL DECOMPRESSION Right [id number]   TRANSURETHRAL RESECTION OF PROSTATE  08/25/12    Home Medications:  Allergies as of 01/22/2024       Reactions   Penicillin G Other (See Comments)   Childhood        Medication List        Accurate as of January 22, 2024 11:59 PM. If you have any questions, ask your nurse or doctor.          albuterol 108 (90 Base) MCG/ACT inhaler Commonly known as: VENTOLIN HFA Inhale 2 puffs into the lungs every 6 (six) hours as needed for wheezing or shortness of breath.   Baclofen 5 MG Tabs Take 1-2 tablets (5-10 mg total) by mouth 3 (three) times daily as needed (msk pain or spasms).   benzonatate 100 MG capsule Commonly known as: TESSALON Take 1 capsule (100 mg total) by mouth 2 (two) times daily as needed for cough.   budesonide-formoterol 160-4.5 MCG/ACT inhaler Commonly known as: SYMBICORT Inhale 2 puffs into the lungs 2 (two) times daily. And can use as rescue/PRN 2 puffs up to 4 times a day for wheeze or cough, max 12 puffs in 24 hours   diphenhydramine-acetaminophen 25-500 MG Tabs tablet Commonly known as: TYLENOL PM Take 2 tablets by mouth at bedtime as needed (pain).   DULoxetine 30 MG capsule Commonly known as: Cymbalta Take 1 capsule (30 mg total) by mouth daily.   naproxen 500 MG tablet Commonly known as: NAPROSYN Take 1 tablet (500 mg total) by mouth 2 (two) times daily as needed for moderate pain.   rosuvastatin 20 MG tablet Commonly known as: Crestor Take 1 tablet (20 mg total) by mouth at bedtime.   sildenafil 20 MG tablet Commonly known as: REVATIO Take 3 to 5 tablets two hours before intercouse on an empty stomach.  Do not take with nitrates.   vitamin B-12 500 MCG tablet Commonly known as: CYANOCOBALAMIN Take 2 tablets (1,000 mcg total) by mouth daily.   Vitamin D (Ergocalciferol) 1.25 MG (50000 UNIT) Caps capsule Commonly known as:  DRISDOL Take 1 capsule (50,000 Units total) by mouth every 7 (seven) days.   VITAMIN D3 PO Take by mouth.        Allergies:  Allergies  Allergen Reactions   Penicillin G Other (See Comments)    Childhood    Family History: Family History  Problem Relation Age of Onset   Aneurysm Mother    Hypertension Mother    Diabetes Father    Hypertension Father    Prostate cancer Father    Lupus Father    Cancer Maternal Aunt    Cancer Maternal Uncle    Diabetes Paternal Uncle    Heart disease Cousin    Prostate cancer Paternal Uncle    COPD Neg Hx    Stroke Neg Hx    Kidney disease Neg Hx     Social History:  reports that he has been smoking cigarettes. He has a 35 pack-year smoking history. He has never used smokeless tobacco. He reports that he does not drink alcohol and does not use drugs.  ROS: For pertinent review of systems please refer to history of present illness  Physical Exam: BP (!) 154/89   Pulse 84   Ht 5\' 10"  (1.778 m)   Wt 160 lb (72.6 kg)   BMI 22.96 kg/m   Constitutional:  Well nourished. Alert and oriented, No acute distress. HEENT: [place] AT, moist mucus membranes.  Trachea midline Cardiovascular: No clubbing, cyanosis, or edema. Respiratory: Normal respiratory effort, no increased work of breathing. GU: No CVA tenderness.  No bladder fullness or masses.  Patient with uncircumcised phallus.  Foreskin easily retracted  Urethral meatus is patent.  No penile discharge. No penile lesions or rashes. Scrotum without lesions, cysts, rashes and/or edema.  Testicles are located scrotally bilaterally. No  masses are appreciated in the testicles. Left and right epididymis are normal. Rectal: Patient with  normal sphincter tone. Anus and perineum without scarring or rashes. No rectal masses are appreciated. Prostate is approximately 50 grams, left > right lobe, no nodules are appreciated. Seminal vesicles could not be palpated.  Neurologic: Grossly intact, no focal  deficits, moving all 4 extremities. Psychiatric: Normal mood and affect.   Laboratory Data: Renal Function Panel Order: [id number] Component Ref Range & Units 11 d ago  Glucose 65 - 99 mg/dL 84  Comment:                                                                                             Fasting reference interval                                           BUN 7 - 25 mg/dL 15  Creatinine 4.09 - 8.11 mg/dL 9.14 High   eGFR CKD-EPI CR 2021 > OR = 60 mL/min/1.7m2 54 Low   BUN/Creatinine Ratio 6 - 22 (calc) 10  Sodium 135 - 146 mmol/L 139  Potassium 3.5 - 5.3 mmol/L 4.5  Chloride 98 - 110 mmol/L 103  Bicarbonate (CO2) 20 - 32 mmol/L 31  Calcium 8.6 - 10.3 mg/dL 9.1  Phosphorus 2.1 - 4.3 mg/dL 3.5  Albumin 3.6 - 5.1 g/dL 4  Resulting Agency See order comments   Specimen Collected: 01/06/24 11:11   Performed by: Chancy Hurter Last Resulted: 01/07/24 11:18  Received From: [facility]  Result Received: 01/07/24 13:14   CBC and Differential Order: [id number] Component Ref Range & Units 11 d ago  WBC 3.8 - 10.8 Thousand/uL 4.6  RBC 4.20 - 5.80 Million/uL 5.71  Hemoglobin 13.2 - 17.1 g/dL 08.6  Hematocrit 57.8 - 50.0 % 43.8  MCV 80.0 - 100.0 fL 76.7 Low   MCH 27.0 - 33.0 pg 25 Low   MCHC 32.0 - 36.0 g/dL 46.9  Comment:      For adults, a slight decrease in the calculated MCHC      value (in the range of 30 to 32 g/dL) is most likely      not clinically significant; however, it should be      interpreted with caution in correlation with other      red cell parameters and the patient's clinical      condition.  RDW 11.0 - 15.0 % 14.3  Platelets 140 - 400 Thousand/uL 229  MPV 7.5 - 12.5 fL 9.9  Neutrophils Absolute 1500 - 7800 cells/uL 2,737  Band Neutrophils Absolute, Manual Count 0 - 750 cells/uL CANCELED  Comment: Result canceled by the ancillary.  Metamyelocytes Absolute 0 cells/uL CANCELED  Comment: Result canceled by the ancillary.   Absolute Myelocytes 0 cells/uL CANCELED  Comment: Result canceled by the ancillary.  Absolute Promyelocytes 0 cells/uL CANCELED  Comment: Result canceled by the ancillary.  Lymphocytes Absolute 850 - 3900 cells/uL 1,072  Monocytes Absolute 200 - 950 cells/uL 603  Eosinophils Absolute 15 - 500  cells/uL 170  Basophils Absolute 0 - 200 cells/uL 18  Blasts Absolute 0 cells/uL CANCELED  Comment: Result canceled by the ancillary.  NRBC Absolute 0 cells/uL CANCELED  Comment: Result canceled by the ancillary.  Neutrophils Relative % 59.5  Bands Absolute % CANCELED  Comment: Result canceled by the ancillary.  Metamyelocytes Percent % CANCELED  Comment: Result canceled by the ancillary.  Myelocytes Relative % CANCELED  Comment: Result canceled by the ancillary.  Promyelocytes Relative % CANCELED  Comment: Result canceled by the ancillary.  Lymphocytes % 23.3  Variant lymphocytes/100 WBC (Bld) 0 - 10 % CANCELED  Comment: Result canceled by the ancillary.  Monocytes % 13.1  Eosinophils % 3.7  Basophils Relative % 0.4  Blasts % CANCELED  Comment: Result canceled by the ancillary.  nRBC 0 /100 WBC CANCELED  Comment: Result canceled by the ancillary.  Comment(s) CANCELED  Comment: Result canceled by the ancillary.  Resulting Agency See order comments   Specimen Collected: 01/06/24 11:11   Performed by: Chancy Hurter Last Resulted: 01/07/24 11:18  Received From: [facility]  Result Received: 01/07/24 13:14   Urinalysis, Complete w/reflex to Culture Order: [id number] Component Ref Range & Units 11 d ago  Color, Urine YELLOW YELLOW  Appearance Urine CLEAR CLEAR  Specific Gravity, UA 1.001 - 1.035 1.012  pH Urine 5.0 - 8.0 5.5  Glucose, Ur NEGATIVE NEGATIVE  Bilirubin, Urine NEGATIVE NEGATIVE  Ketones, Urine NEGATIVE NEGATIVE  Hemoglobin Ur Ql Strip NEGATIVE NEGATIVE  Protein, Ur NEGATIVE NEGATIVE  Nitrite, Urine NEGATIVE NEGATIVE  WBC Esterase  Urine NEGATIVE NEGATIVE  WBC, Urine < OR = 5 /HPF NONE SEEN  RBC, Urine < OR = 2 /HPF NONE SEEN  Epithelial Cells in Urine < OR = 5 /HPF NONE SEEN  Trans Epithelial, Urine < OR = 5 /HPF CANCELED  Comment: Result canceled by the ancillary.  Renal Epithelial Cells, Urine < OR = 3 /HPF CANCELED  Comment: Result canceled by the ancillary.  Bacteria NONE SEEN /HPF NONE SEEN  Calcium Oxalate Crystals, Urine NONE OR FEW /HPF CANCELED  Comment: Result canceled by the ancillary.  Triple Phosphate Crystals, Urine NONE OR FEW /HPF CANCELED  Comment: Result canceled by the ancillary.  Uric Acid Crystals, Urine NONE OR FEW /HPF CANCELED  Comment: Result canceled by the ancillary.  Amorphous Sediments NONE OR FEW /HPF CANCELED  Comment: Result canceled by the ancillary.  Crystals NONE SEEN /HPF CANCELED  Comment: Result canceled by the ancillary.  Hyaline Casts, Urine NONE SEEN /LPF 0-5 Abnormal   Granular Casts, Urine NONE SEEN /LPF CANCELED  Comment: Result canceled by the ancillary.  Casts NONE SEEN /LPF CANCELED  Comment: Result canceled by the ancillary.  Yeast, UA NONE SEEN /HPF CANCELED  Comment: Result canceled by the ancillary.  Note:   Comment:      This urine was analyzed for the presence of WBC,      RBC, bacteria, casts, and other formed elements.      Only those elements seen were reported.                Resulting Agency See order comments   Specimen Collected: 01/06/24 11:11   Performed by: Chancy Hurter Last Resulted: 01/07/24 11:18  Received From: [facility]  Result Received: 01/07/24 13:14  I have reviewed the labs.  Pertinent Imaging N/A   Assessment & Plan:    1. BPH with LU TS -PSA pending  -DRE benign -UA benign -symptoms - none  2. Erectile dysfunction -  continue sildenafil 20 mg, on-demand-dosing  3. High risk hematuria -work up completed in 2013 and 2022 - positive for an enlarged prostate -no reports of gross hematuria -UA  (01/2024) negative for micro heme  Return in about 6 months (around 07/24/2024) for PSA,  I PSS, SHIM .  Cloretta Ned  [facility] [address] [place], [place] [address] [phone]

## 2024-01-22 ENCOUNTER — Ambulatory Visit (INDEPENDENT_AMBULATORY_CARE_PROVIDER_SITE_OTHER): Admitting: Urology

## 2024-01-22 VITALS — BP 154/89 | HR 84 | Ht 70.0 in | Wt 160.0 lb

## 2024-01-22 DIAGNOSIS — R319 Hematuria, unspecified: Secondary | ICD-10-CM | POA: Diagnosis not present

## 2024-01-22 DIAGNOSIS — M7542 Impingement syndrome of left shoulder: Secondary | ICD-10-CM | POA: Diagnosis not present

## 2024-01-22 DIAGNOSIS — N138 Other obstructive and reflux uropathy: Secondary | ICD-10-CM | POA: Diagnosis not present

## 2024-01-22 DIAGNOSIS — M7541 Impingement syndrome of right shoulder: Secondary | ICD-10-CM | POA: Diagnosis not present

## 2024-01-22 DIAGNOSIS — N529 Male erectile dysfunction, unspecified: Secondary | ICD-10-CM | POA: Diagnosis not present

## 2024-01-22 DIAGNOSIS — N401 Enlarged prostate with lower urinary tract symptoms: Secondary | ICD-10-CM | POA: Diagnosis not present

## 2024-01-24 ENCOUNTER — Encounter: Payer: Self-pay | Admitting: Urology

## 2024-02-04 ENCOUNTER — Ambulatory Visit: Payer: Self-pay | Admitting: Family Medicine

## 2024-02-18 ENCOUNTER — Ambulatory Visit: Admitting: Family Medicine

## 2024-03-11 ENCOUNTER — Ambulatory Visit: Admitting: Family Medicine

## 2024-03-14 ENCOUNTER — Ambulatory Visit (INDEPENDENT_AMBULATORY_CARE_PROVIDER_SITE_OTHER): Admitting: Family Medicine

## 2024-03-14 ENCOUNTER — Encounter: Payer: Self-pay | Admitting: Family Medicine

## 2024-03-14 ENCOUNTER — Telehealth: Payer: Self-pay

## 2024-03-14 VITALS — BP 128/72 | HR 76 | Resp 16 | Ht 70.0 in | Wt 145.0 lb

## 2024-03-14 DIAGNOSIS — E559 Vitamin D deficiency, unspecified: Secondary | ICD-10-CM | POA: Diagnosis not present

## 2024-03-14 DIAGNOSIS — N183 Chronic kidney disease, stage 3 unspecified: Secondary | ICD-10-CM | POA: Diagnosis not present

## 2024-03-14 DIAGNOSIS — E538 Deficiency of other specified B group vitamins: Secondary | ICD-10-CM

## 2024-03-14 DIAGNOSIS — J439 Emphysema, unspecified: Secondary | ICD-10-CM

## 2024-03-14 DIAGNOSIS — F339 Major depressive disorder, recurrent, unspecified: Secondary | ICD-10-CM

## 2024-03-14 DIAGNOSIS — R7303 Prediabetes: Secondary | ICD-10-CM

## 2024-03-14 DIAGNOSIS — Z636 Dependent relative needing care at home: Secondary | ICD-10-CM | POA: Diagnosis not present

## 2024-03-14 DIAGNOSIS — Z5181 Encounter for therapeutic drug level monitoring: Secondary | ICD-10-CM

## 2024-03-14 DIAGNOSIS — E782 Mixed hyperlipidemia: Secondary | ICD-10-CM

## 2024-03-14 MED ORDER — BUDESONIDE-FORMOTEROL FUMARATE 160-4.5 MCG/ACT IN AERO: 2.0000 | INHALATION_SPRAY | Freq: Two times a day (BID) | RESPIRATORY_TRACT | 3 refills | Status: DC

## 2024-03-14 MED ORDER — ROSUVASTATIN CALCIUM 20 MG PO TABS
20.0000 mg | ORAL_TABLET | Freq: Every day | ORAL | 3 refills | Status: AC
Start: 2024-03-14 — End: ?

## 2024-03-14 MED ORDER — BUDESONIDE-FORMOTEROL FUMARATE 160-4.5 MCG/ACT IN AERO: 2.0000 | INHALATION_SPRAY | Freq: Two times a day (BID) | RESPIRATORY_TRACT | 3 refills | Status: AC

## 2024-03-14 MED ORDER — DULOXETINE HCL 30 MG PO CPEP: 30.0000 mg | ORAL_CAPSULE | Freq: Every day | ORAL | 1 refills | Status: DC

## 2024-03-14 NOTE — Addendum Note (Signed)
 Addended by: Elissa Guise on: 03/14/2024 04:20 PM   Modules accepted: Orders

## 2024-03-14 NOTE — Telephone Encounter (Signed)
 Copied from CRM [id number]. Topic: Clinical - Prescription Issue >> Mar 14, 2024  3:15 PM Stanly Early wrote: Reason for CRM: Ethyl Hering calling from [facility] to get the Rx fixed. budesonide -formoterol  (SYMBICORT ) its supposed to be a quantity of 10.2 not 10.1 or 10

## 2024-03-14 NOTE — Addendum Note (Signed)
 Addended by: Elissa Guise on: 03/14/2024 02:48 PM   Modules accepted: Orders

## 2024-03-14 NOTE — Progress Notes (Signed)
 Name: Manuel [name]   MRN: [id number]    DOB: 06/15/1956   Date:03/14/2024       Progress Note  Chief Complaint  Patient presents with   Medical Management of Chronic Issues     Subjective:   Manuel [name] is a 68 y.o. male, presents to clinic for routine follow up on chronic conditions  COPD - on symbicort    Hyperlipidemia: Crestor  20 - not taking - he forgets and is exhausted  Last Lipids: poorly controlled  Lab Results  Component Value Date   CHOL 193 09/16/2023   HDL 45 09/16/2023   LDLCALC 125 (H) 09/16/2023   TRIG 120 09/16/2023   CHOLHDL 4.3 09/16/2023   Prediabetes  Lab Results  Component Value Date   HGBA1C 6.0 (H) 09/16/2023   B12 and Vit D deficiencies - with last OV Nov pt was put on supplements and due to recheck today  CKD recheck labs today      03/14/2024    9:07 AM 11/23/2023    9:39 AM 11/06/2023    8:54 AM  Depression screen PHQ 2/9  Decreased Interest 0 0 0  Down, Depressed, Hopeless 1 1 1   PHQ - 2 Score 1 1 1   Altered sleeping 0 0 0  Tired, decreased energy 1 1 0  Change in appetite 1 0 0  Feeling bad or failure about yourself  0 0 0  Trouble concentrating 0 0 0  Moving slowly or fidgety/restless 0 0 0  Suicidal thoughts 0 0 0  PHQ-9 Score 3 2 1       10/04/2020    2:51 PM 06/04/2020    2:43 PM  GAD 7 : Generalized Anxiety Score  Nervous, Anxious, on Edge 0 0  Control/stop worrying 0 1  Worry too much - different things 1 1  Trouble relaxing 3 1  Restless 2 1  Easily annoyed or irritable 0 0  Afraid - awful might happen 0 1  Total GAD 7 Score 6 5  Anxiety Difficulty  Somewhat difficult   On cymbalta  - stress with work and caregiver stress - constant - not improving and no change in support or resources      Current Outpatient Medications:    albuterol  (VENTOLIN  HFA) 108 (90 Base) MCG/ACT inhaler, Inhale 2 puffs into the lungs every 6 (six) hours as needed for wheezing or shortness of breath., Disp: 8 g, Rfl: 0   Baclofen   5 MG TABS, Take 1-2 tablets (5-10 mg total) by mouth 3 (three) times daily as needed (msk pain or spasms)., Disp: 60 tablet, Rfl: 0   benzonatate  (TESSALON ) 100 MG capsule, Take 1 capsule (100 mg total) by mouth 2 (two) times daily as needed for cough., Disp: 30 capsule, Rfl: 1   budesonide -formoterol  (SYMBICORT ) 160-4.5 MCG/ACT inhaler, Inhale 2 puffs into the lungs 2 (two) times daily. And can use as rescue/PRN 2 puffs up to 4 times a day for wheeze or cough, max 12 puffs in 24 hours, Disp: 1 each, Rfl: 3   diphenhydramine-acetaminophen  (TYLENOL  PM) 25-500 MG TABS tablet, Take 2 tablets by mouth at bedtime as needed (pain)., Disp: , Rfl:    DULoxetine  (CYMBALTA ) 30 MG capsule, Take 1 capsule (30 mg total) by mouth daily., Disp: 30 capsule, Rfl: 1   naproxen  (NAPROSYN ) 500 MG tablet, Take 1 tablet (500 mg total) by mouth 2 (two) times daily as needed for moderate pain., Disp: 60 tablet, Rfl: 0   rosuvastatin  (CRESTOR ) 20 MG tablet,  Take 1 tablet (20 mg total) by mouth at bedtime., Disp: 90 tablet, Rfl: 1   sildenafil  (REVATIO ) 20 MG tablet, Take 3 to 5 tablets two hours before intercouse on an empty stomach.  Do not take with nitrates., Disp: 30 tablet, Rfl: 3   vitamin B-12 (CYANOCOBALAMIN ) 500 MCG tablet, Take 2 tablets (1,000 mcg total) by mouth daily., Disp: 30 tablet, Rfl: 11   Vitamin D , Ergocalciferol , (DRISDOL ) 1.25 MG (50000 UNIT) CAPS capsule, Take 1 capsule (50,000 Units total) by mouth every 7 (seven) days., Disp: 12 capsule, Rfl: 0  Patient Active Problem List   Diagnosis Date Noted   Advanced care planning/counseling discussion 09/16/2023   Caregiver stress 08/15/2021   Hx of colonic polyps    Polyp of sigmoid colon    Primary insomnia 10/31/2020   Chronic kidney disease, stage III (moderate) (HCC) 11/12/2018   Recurrent depression (HCC) 07/15/2018   Chronic tension-type headache, intractable 05/17/2018   Myofascial pain syndrome 05/17/2018   Chronic daily headache 04/12/2018    Coronary artery disease 01/11/2018   Aortic atherosclerosis (HCC) 01/11/2018   Pulmonary emphysema (HCC) 01/11/2018   Mass of left thigh 12/30/2017   Vitamin D  deficiency 01/14/2016   Vitamin B12 deficiency 01/14/2016   Renal lesion 11/13/2015   Chronic lower back pain 11/09/2015   BPH (benign prostatic hyperplasia) 11/01/2015   Elevated PSA 11/01/2015   Hyperlipidemia 11/01/2015   Bunion, left foot 11/01/2015   Sickle cell trait (HCC) 01/16/2015   Cervical spondylosis without myelopathy 01/09/2015   Carpal tunnel syndrome 01/09/2015   Rotator cuff syndrome of left shoulder 09/18/2014   LVH (left ventricular hypertrophy) 08/18/2014   Current smoker 10/03/2013    Past Surgical History:  Procedure Laterality Date   CARPAL TUNNEL RELEASE Left    COLONOSCOPY WITH PROPOFOL  N/A 12/25/2015   Procedure: COLONOSCOPY WITH PROPOFOL ;  Surgeon: Marnee Sink, MD;  Location: [facility];  Service: Endoscopy;  Laterality: N/A;   COLONOSCOPY WITH PROPOFOL  N/A 05/02/2021   Procedure: COLONOSCOPY WITH PROPOFOL ;  Surgeon: Marnee Sink, MD;  Location: [facility];  Service: Endoscopy;  Laterality: N/A;   CYSTOURETHROSCOPY  08/25/12   with ureteral cathization w/wo retrograde pyelogram   HOLEP-LASER ENUCLEATION OF THE PROSTATE WITH MORCELLATION N/A 04/05/2021   Procedure: HOLEP-LASER ENUCLEATION OF THE PROSTATE WITH MORCELLATION;  Surgeon: Lawerence Pressman, MD;  Location: [facility];  Service: Urology;  Laterality: N/A;   POLYPECTOMY N/A 05/02/2021   Procedure: POLYPECTOMY;  Surgeon: Marnee Sink, MD;  Location: [facility];  Service: Endoscopy;  Laterality: N/A;   ROTATOR CUFF REPAIR Right 01/25/15   SHOULDER ARTHROSCOPY WITH SUBACROMIAL DECOMPRESSION Right [id number]   TRANSURETHRAL RESECTION OF PROSTATE  08/25/12    Family History  Problem Relation Age of Onset   Aneurysm Mother    Hypertension Mother    Diabetes Father    Hypertension Father    Prostate cancer Father    Lupus Father     Cancer Maternal Aunt    Cancer Maternal Uncle    Diabetes Paternal Uncle    Heart disease Cousin    Prostate cancer Paternal Uncle    COPD Neg Hx    Stroke Neg Hx    Kidney disease Neg Hx     Social History   Tobacco Use   Smoking status: Every Day    Current packs/day: 1.00    Average packs/day: 1 pack/day for 35.0 years (35.0 ttl pk-yrs)    Types: Cigarettes   Smokeless tobacco: Never  Vaping Use  Vaping status: Never Used  Substance Use Topics   Alcohol use: No   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     Allergies  Allergen Reactions   Penicillin G Other (See Comments)    Childhood    Health Maintenance  Topic Date Due   COVID-19 Vaccine (6 - 2024-25 season) 02/29/2024   Colonoscopy  05/02/2024   DTaP/Tdap/Td (2 - Tdap) 03/11/2025 (Originally 11/04/2023)   Lung Cancer Screening  06/01/2024   INFLUENZA VACCINE  06/03/2024   Medicare Annual Wellness (AWV)  06/04/2024   Pneumonia Vaccine 12+ Years old  Completed   Hepatitis C Screening  Completed   HIV Screening  Completed   Zoster Vaccines- Shingrix   Completed   HPV VACCINES  Aged Out   Meningococcal B Vaccine  Aged Out    Chart Review Today: I personally reviewed active problem list, medication list, allergies, family history, social history, health maintenance, notes from last encounter, lab results, imaging with the patient/caregiver today.   Review of Systems  Constitutional: Negative.   HENT: Negative.    Eyes: Negative.   Respiratory: Negative.    Cardiovascular: Negative.   Gastrointestinal: Negative.   Endocrine: Negative.   Genitourinary: Negative.   Musculoskeletal: Negative.   Skin: Negative.   Allergic/Immunologic: Negative.   Neurological: Negative.   Hematological: Negative.   Psychiatric/Behavioral:  Positive for dysphoric mood. Negative for behavioral problems, sleep disturbance and suicidal ideas. The patient is not hyperactive.   All other systems reviewed and are  negative.    Objective:   Vitals:   03/14/24 0901  BP: 128/72  Pulse: 76  Resp: 16  SpO2: 98%  Weight: 145 lb (65.8 kg)  Height: 5\' 10"  (1.778 m)    Body mass index is 20.81 kg/m.  Physical Exam Vitals and nursing note reviewed.  Constitutional:      General: He is not in acute distress.    Appearance: He is well-developed. He is not ill-appearing, toxic-appearing or diaphoretic.  HENT:     Head: Normocephalic and atraumatic.     Nose: Nose normal.  Eyes:     General:        Right eye: No discharge.        Left eye: No discharge.     Conjunctiva/sclera: Conjunctivae normal.  Neck:     Trachea: No tracheal deviation.  Cardiovascular:     Rate and Rhythm: Normal rate and regular rhythm.     Pulses: Normal pulses.     Heart sounds: Normal heart sounds.  Pulmonary:     Effort: Pulmonary effort is normal. No respiratory distress.     Breath sounds: Normal breath sounds. No stridor. No wheezing, rhonchi or rales.  Musculoskeletal:        General: Normal range of motion.  Skin:    General: Skin is warm and dry.     Findings: No rash.  Neurological:     Mental Status: He is alert.     Motor: No abnormal muscle tone.     Coordination: Coordination normal.  Psychiatric:        Mood and Affect: Mood is depressed. Mood is not anxious. Affect is not tearful.        Behavior: Behavior normal. Behavior is cooperative.         Results for orders placed or performed in visit on 01/18/24  PSA   Collection Time: 01/18/24  8:28 AM  Result Value Ref Range   Prostate Specific  Ag, Serum 1.8 0.0 - 4.0 ng/mL      Assessment & Plan:   1. Pulmonary emphysema, unspecified emphysema type (HCC) Still smoking, not using inhalers regularly, no change to baseline sx, no recent exacerbations Encouraged reducing smoking, using inhaler when needed, getting appt with exacerbations - budesonide -formoterol  (SYMBICORT ) 160-4.5 MCG/ACT inhaler; Inhale 2 puffs into the lungs 2 (two)  times daily. And can use as rescue/PRN 2 puffs up to 4 times a day for wheeze or cough, max 12 puffs in 24 hours  Dispense: 1 each; Refill: 3  2. Mixed hyperlipidemia (Primary) Not taking statins - discussed his LDL and the benefit of statins, he will start to try and take more often - Comprehensive metabolic panel with GFR - Lipid panel - rosuvastatin  (CRESTOR ) 20 MG tablet; Take 1 tablet (20 mg total) by mouth daily.  Dispense: 90 tablet; Refill: 3  3. Vitamin D  deficiency Low last OV and supplemented with Rx from [facility] Recheck  - VITAMIN D  25 Hydroxy (Vit-D Deficiency, Fractures)  4. Prediabetes Encouraged after last labs to work on diet efforts recheck - Hemoglobin A1c - Comprehensive metabolic panel with GFR  5. Vitamin B12 deficiency Given supplement, but poor med compliance, recheck - Vitamin B12  6. Stage 3 chronic kidney disease, unspecified whether stage 3a or 3b CKD (HCC) Monitoring, BP at goal today  7. Caregiver stress He requests psych consult - he will [facility] to [facility] PHQ neg with low score but pt visibly down/exhausted - DULoxetine  (CYMBALTA ) 30 MG capsule; Take 1 capsule (30 mg total) by mouth daily.  Dispense: 90 capsule; Refill: 1 - Ambulatory referral to Psychiatry  8. Encounter for medication monitoring  - Vitamin B12 - Hemoglobin A1c - Comprehensive metabolic panel with GFR - Lipid panel - VITAMIN D  25 Hydroxy (Vit-D Deficiency, Fractures)  9. Recurrent depression (HCC) See #7 - DULoxetine  (CYMBALTA ) 30 MG capsule; Take 1 capsule (30 mg total) by mouth daily.  Dispense: 90 capsule; Refill: 1 - Ambulatory referral to Psychiatry    Return in about 6 months (around 09/14/2024) for Routine follow-up.   Adeline Hone, PA-C 03/14/24 10:01 AM

## 2024-03-15 ENCOUNTER — Ambulatory Visit: Payer: Self-pay | Admitting: Family Medicine

## 2024-03-15 DIAGNOSIS — E538 Deficiency of other specified B group vitamins: Secondary | ICD-10-CM

## 2024-03-15 LAB — COMPREHENSIVE METABOLIC PANEL WITH GFR
AG Ratio: 1.5 (calc) (ref 1.0–2.5)
ALT: 6 U/L — ABNORMAL LOW (ref 9–46)
AST: 16 U/L (ref 10–35)
Albumin: 4.2 g/dL (ref 3.6–5.1)
Alkaline phosphatase (APISO): 61 U/L (ref 35–144)
BUN/Creatinine Ratio: 8 (calc) (ref 6–22)
BUN: 11 mg/dL (ref 7–25)
CO2: 34 mmol/L — ABNORMAL HIGH (ref 20–32)
Calcium: 9.6 mg/dL (ref 8.6–10.3)
Chloride: 104 mmol/L (ref 98–110)
Creat: 1.4 mg/dL — ABNORMAL HIGH (ref 0.70–1.35)
Globulin: 2.8 g/dL (ref 1.9–3.7)
Glucose, Bld: 88 mg/dL (ref 65–99)
Potassium: 4.7 mmol/L (ref 3.5–5.3)
Sodium: 142 mmol/L (ref 135–146)
Total Bilirubin: 0.5 mg/dL (ref 0.2–1.2)
Total Protein: 7 g/dL (ref 6.1–8.1)
eGFR: 56 mL/min/{1.73_m2} — ABNORMAL LOW (ref >=60)

## 2024-03-15 LAB — LIPID PANEL
Cholesterol: 228 mg/dL — ABNORMAL HIGH (ref <200)
HDL: 54 mg/dL (ref >=40)
LDL Cholesterol (Calc): 151 mg/dL — ABNORMAL HIGH
Non-HDL Cholesterol (Calc): 174 mg/dL — ABNORMAL HIGH (ref <130)
Total CHOL/HDL Ratio: 4.2 (calc) (ref <5.0)
Triglycerides: 110 mg/dL (ref <150)

## 2024-03-15 LAB — VITAMIN D 25 HYDROXY (VIT D DEFICIENCY, FRACTURES): Vit D, 25-Hydroxy: 34 ng/mL (ref 30–100)

## 2024-03-15 LAB — HEMOGLOBIN A1C
Hgb A1c MFr Bld: 6.1 % — ABNORMAL HIGH (ref <5.7)
Mean Plasma Glucose: 128 mg/dL
eAG (mmol/L): 7.1 mmol/L

## 2024-03-15 LAB — VITAMIN B12: Vitamin B-12: 309 pg/mL (ref 200–1100)

## 2024-03-15 MED ORDER — VITAMIN B-12 500 MCG PO TABS: 1000.0000 ug | ORAL_TABLET | Freq: Every day | ORAL | 11 refills | Status: AC

## 2024-05-17 DIAGNOSIS — Z79899 Other long term (current) drug therapy: Secondary | ICD-10-CM | POA: Diagnosis not present

## 2024-06-10 ENCOUNTER — Ambulatory Visit: Payer: Self-pay

## 2024-06-10 VITALS — BP 128/72 | Ht 70.0 in | Wt 131.0 lb

## 2024-06-10 DIAGNOSIS — Z0000 Encounter for general adult medical examination without abnormal findings: Secondary | ICD-10-CM

## 2024-06-10 NOTE — Progress Notes (Signed)
 Because this visit was a virtual/telehealth visit,  certain criteria was not obtained, such a blood pressure, CBG if applicable, and timed get up and go. Any medications not marked as taking were not mentioned during the medication reconciliation part of the visit. Any vitals not documented were not able to be obtained due to this being a telehealth visit or patient was unable to self-report a recent blood pressure reading due to a lack of equipment at home via telehealth. Vitals that have been documented are verbally provided by the patient.  This visit was performed by a medical professional under my direct supervision. I was immediately available for consultation/collaboration. I have reviewed and agree with the Annual Wellness Visit documentation.  Subjective:   Manuel [name] is a 68 y.o. who presents for a Medicare Wellness preventive visit.  As a reminder, Annual Wellness Visits don't include a physical exam, and some assessments may be limited, especially if this visit is performed virtually. We may recommend an in-person follow-up visit with your provider if needed.  Visit Complete: Virtual I connected with  Manuel [name] on 06/10/24 by a audio enabled telemedicine application and verified that I am speaking with the correct person using two identifiers.  Patient Location: Home  Provider Location: Home Office  I discussed the limitations of evaluation and management by telemedicine. The patient expressed understanding and agreed to proceed.  Vital Signs: Because this visit was a virtual/telehealth visit, some criteria may be missing or patient reported. Any vitals not documented were not able to be obtained and vitals that have been documented are patient reported.  VideoDeclined- This patient declined Librarian, academic. Therefore the visit was completed with audio only.  Persons Participating in Visit: Patient.  AWV Questionnaire: No: Patient  Medicare AWV questionnaire was not completed prior to this visit.  Cardiac Risk Factors include: advanced age (>49men, >59 women);male gender;dyslipidemia     Objective:    Today's Vitals   06/10/24 1148  BP: 128/72  Weight: 131 lb (59.4 kg)  Height: 5' 10 (1.778 m)   Body mass index is 18.8 kg/m.     06/10/2024   11:51 AM 06/05/2023   11:44 AM 11/11/2022   11:42 AM 06/29/2021    8:51 AM 06/18/2021    9:42 AM 05/02/2021    8:19 AM 04/05/2021    6:22 AM  Advanced Directives  Does Patient Have a Medical Advance Directive? Yes Yes No No No No No  Type of Estate agent of [place];Living will Healthcare Power of [place];Living will       Does patient want to make changes to medical advance directive? No - Patient declined        Copy of Healthcare Power of Attorney in Chart? No - copy requested        Would patient like information on creating a medical advance directive?   No - Patient declined  No - Patient declined No - Patient declined No - Patient declined    Current Medications (verified) Outpatient Encounter Medications as of 06/10/2024  Medication Sig   albuterol  (VENTOLIN  HFA) 108 (90 Base) MCG/ACT inhaler Inhale 2 puffs into the lungs every 6 (six) hours as needed for wheezing or shortness of breath.   Baclofen  5 MG TABS Take 1-2 tablets (5-10 mg total) by mouth 3 (three) times daily as needed (msk pain or spasms).   benzonatate  (TESSALON ) 100 MG capsule Take 1 capsule (100 mg total) by mouth 2 (two) times daily  as needed for cough.   budesonide -formoterol  (SYMBICORT ) 160-4.5 MCG/ACT inhaler Inhale 2 puffs into the lungs 2 (two) times daily. And can use as rescue/PRN 2 puffs up to 4 times a day for wheeze or cough, max 12 puffs in 24 hours   diphenhydramine-acetaminophen  (TYLENOL  PM) 25-500 MG TABS tablet Take 2 tablets by mouth at bedtime as needed (pain).   DULoxetine  (CYMBALTA ) 30 MG capsule Take 1 capsule (30 mg total) by mouth daily.   rosuvastatin   (CRESTOR ) 20 MG tablet Take 1 tablet (20 mg total) by mouth daily.   sildenafil  (REVATIO ) 20 MG tablet Take 3 to 5 tablets two hours before intercouse on an empty stomach.  Do not take with nitrates.   vitamin B-12 (CYANOCOBALAMIN ) 500 MCG tablet Take 2 tablets (1,000 mcg total) by mouth daily.   No facility-administered encounter medications on file as of 06/10/2024.    Allergies (verified) Penicillin g   History: Past Medical History:  Diagnosis Date   Anemia    Anxiety    Aortic atherosclerosis (HCC) 01/11/2018   CT scan Feb 2019   Asthma    Bilateral carpal tunnel syndrome    Bilateral hand numbness    BPH without urinary obstruction    Cervical spondylosis without myelopathy    Chronic kidney disease    STAGE 3   Complete tear of rotator cuff    bilateral   COPD (chronic obstructive pulmonary disease) (HCC)    Depression    Emphysema lung (HCC) 01/11/2018   Chest CT Feb 2019   History of stomach ulcers    HLD (hyperlipidemia)    LVH (left ventricular hypertrophy)    Personal history of tobacco use, presenting hazards to health 12/21/2015   Poor dentition    Sickle cell trait (HCC)    Sleep apnea    Smoker    Past Surgical History:  Procedure Laterality Date   CARPAL TUNNEL RELEASE Left    COLONOSCOPY WITH PROPOFOL  N/A 12/25/2015   Procedure: COLONOSCOPY WITH PROPOFOL ;  Surgeon: Rogelia Copping, MD;  Location: [facility];  Service: Endoscopy;  Laterality: N/A;   COLONOSCOPY WITH PROPOFOL  N/A 05/02/2021   Procedure: COLONOSCOPY WITH PROPOFOL ;  Surgeon: Copping Rogelia, MD;  Location: [facility];  Service: Endoscopy;  Laterality: N/A;   CYSTOURETHROSCOPY  08/25/12   with ureteral cathization w/wo retrograde pyelogram   HOLEP-LASER ENUCLEATION OF THE PROSTATE WITH MORCELLATION N/A 04/05/2021   Procedure: HOLEP-LASER ENUCLEATION OF THE PROSTATE WITH MORCELLATION;  Surgeon: Francisca Redell BROCKS, MD;  Location: [facility] OR[facility];  Service: Urology;  Laterality: N/A;   POLYPECTOMY N/A  05/02/2021   Procedure: POLYPECTOMY;  Surgeon: Copping Rogelia, MD;  Location: [facility];  Service: Endoscopy;  Laterality: N/A;   ROTATOR CUFF REPAIR Right 01/25/15   SHOULDER ARTHROSCOPY WITH SUBACROMIAL DECOMPRESSION Right [id number]   TRANSURETHRAL RESECTION OF PROSTATE  08/25/12   Family History  Problem Relation Age of Onset   Aneurysm Mother    Hypertension Mother    Diabetes Father    Hypertension Father    Prostate cancer Father    Lupus Father    Cancer Maternal Aunt    Cancer Maternal Uncle    Diabetes Paternal Uncle    Heart disease Cousin    Prostate cancer Paternal Uncle    COPD Neg Hx    Stroke Neg Hx    Kidney disease Neg Hx    Social History   Socioeconomic History   Marital status: Married    Spouse name: Not on file  Number of children: Not on file   Years of education: Not on file   Highest education level: Not on file  Occupational History   Not on file  Tobacco Use   Smoking status: Every Day    Current packs/day: 1.00    Average packs/day: 1 pack/day for 35.0 years (35.0 ttl pk-yrs)    Types: Cigarettes   Smokeless tobacco: Never  Vaping Use   Vaping status: Never Used  Substance and Sexual Activity   Alcohol use: No   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   Sexual activity: Yes  Other Topics Concern   Not on file  Social History Narrative   Not on file   Social Drivers of Health   Financial Resource Strain: Low Risk  (06/10/2024)   Overall Financial Resource Strain (CARDIA)    Difficulty of Paying Living Expenses: Not hard at all  Food Insecurity: No Food Insecurity (06/10/2024)   Hunger Vital Sign    Worried About Running Out of Food in the Last Year: Never true    Ran Out of Food in the Last Year: Never true  Transportation Needs: No Transportation Needs (06/10/2024)   PRAPARE - Administrator, Civil Service (Medical): No    Lack of Transportation (Non-Medical): No  Physical Activity: Sufficiently Active  (06/10/2024)   Exercise Vital Sign    Days of Exercise per Week: 7 days    Minutes of Exercise per Session: 60 min  Stress: Stress Concern Present (06/10/2024)   [employer] of Occupational Health - Occupational Stress Questionnaire    Feeling of Stress: To some extent  Social Connections: Moderately Isolated (06/10/2024)   Social Connection and Isolation Panel    Frequency of Communication with Friends and Family: More than three times a week    Frequency of Social Gatherings with Friends and Family: Three times a week    Attends Religious Services: Never    Active Member of Clubs or Organizations: No    Attends Banker Meetings: Never    Marital Status: Married    Tobacco Counseling Ready to quit: Not Answered Counseling given: Not Answered    Clinical Intake:  Pre-visit preparation completed: Yes  Pain : No/denies pain     BMI - recorded: 18.8 Nutritional Status: BMI <19  Underweight Nutritional Risks: Unintentional weight loss Diabetes: No  Lab Results  Component Value Date   HGBA1C 6.1 (H) 03/14/2024   HGBA1C 6.0 (H) 09/16/2023   HGBA1C 5.8 10/30/2020     How often do you need to have someone help you when you read instructions, pamphlets, or other written materials from your doctor or pharmacy?: 1 - Never  Interpreter Needed?: No  Information entered by :: Manuel [name],CMA   Activities of Daily Living     06/10/2024   11:50 AM 09/16/2023    9:08 AM  In your present state of health, do you have any difficulty performing the following activities:  Hearing? 0 1  Vision? 0 1  Difficulty concentrating or making decisions? 0 0  Walking or climbing stairs? 0 1  Dressing or bathing? 0 0  Doing errands, shopping? 0 0  Preparing Food and eating ? N   Using the Toilet? N   In the past six months, have you accidently leaked urine? N   Do you have problems with loss of bowel control? N   Managing your Medications? N   Managing your Finances?  N  Housekeeping or managing your Housekeeping? N     Patient Care Team: Leavy Mole, PA-C as PCP - General (Family Medicine) Jinny Carmine, MD as Consulting Physician (Gastroenterology) Helon Clotilda DELENA DEVONNA as Physician Assistant (Urology) Verlinda Boas, PA-C (Orthopedic Surgery) Maree Jannett POUR, MD as Consulting Physician (Neurology) Blair Mt, MD as Referring Physician (Otolaryngology)  I have updated your Care Teams any recent Medical Services you may have received from other providers in the past year.     Assessment:   This is a routine wellness examination for Manuel [name].  Hearing/Vision screen Hearing Screening - Comments:: No difficulties Vision Screening - Comments:: Patient wears glasses    Goals Addressed             This Visit's Progress    Patient Stated       To do what's right        Depression Screen     06/10/2024   11:54 AM 03/14/2024    9:07 AM 11/23/2023    9:39 AM 11/06/2023    8:54 AM 09/16/2023    9:08 AM 06/05/2023   11:42 AM 04/16/2023   10:55 AM  PHQ 2/9 Scores  PHQ - 2 Score 3 1 1 1  0 0 0  PHQ- 9 Score 4 3 2 1  0  0    Fall Risk     06/10/2024   11:51 AM 09/16/2023    9:08 AM 06/05/2023   11:36 AM 04/16/2023   10:55 AM 08/05/2022    8:46 AM  Fall Risk   Falls in the past year? 0 0 0 0 0  Number falls in past yr: 0 0 0 0 0  Injury with Fall? 0 0 0 0 0  Risk for fall due to : No Fall Risks No Fall Risks No Fall Risks No Fall Risks No Fall Risks  Follow up Falls evaluation completed Falls prevention discussed;Education provided;Falls evaluation completed Education provided;Falls prevention discussed Falls prevention discussed;Education provided;Falls evaluation completed Falls prevention discussed;Education provided      Data saved with a previous flowsheet row definition    MEDICARE RISK AT HOME:  Medicare Risk at Home Any stairs in or around the home?: Yes If so, are there any without handrails?: No Home free of loose throw rugs in  walkways, pet beds, electrical cords, etc?: Yes Adequate lighting in your home to reduce risk of falls?: Yes Life alert?: No Use of a cane, walker or w/c?: No Grab bars in the bathroom?: Yes Shower chair or bench in shower?: Yes Elevated toilet seat or a handicapped toilet?: Yes  TIMED UP AND GO:  Was the test performed?  No  Cognitive Function: 6CIT completed        06/10/2024   11:49 AM 06/05/2023   11:46 AM  6CIT Screen  What Year? 0 points 0 points  What month? 0 points 0 points  What time? 0 points 0 points  Count back from 20 4 points 0 points  Months in reverse 4 points 0 points  Repeat phrase 4 points 0 points  Total Score 12 points 0 points    Immunizations Immunization History  Administered Date(s) Administered   Influenza, Mdck, Trivalent,PF 6+ MOS(egg free) 10/03/2013   Influenza, Seasonal, Injecte, Preservative Fre 12/27/2012   Influenza,inj,Quad PF,6+ Mos 11/01/2015, 10/31/2016, 08/24/2017, 07/15/2018, 08/26/2019, 10/04/2020, 08/15/2021, 07/15/2022   Influenza-Unspecified 08/18/2023   PFIZER(Purple Top)SARS-COV-2 Vaccination 03/14/2020, 04/04/2020, 10/24/2020, 03/14/2021   PNEUMOCOCCAL CONJUGATE-20 04/16/2023   Pfizer(Comirnaty )Fall Seasonal Vaccine 12 years and older 08/31/2023  Pneumococcal Polysaccharide-23 04/12/2018   Td 11/03/2013   Zoster Recombinant(Shingrix ) 12/17/2021, 07/15/2022    Screening Tests Health Maintenance  Topic Date Due   COVID-19 Vaccine (6 - 2024-25 season) 02/29/2024   Colonoscopy  05/02/2024   Lung Cancer Screening  06/01/2024   INFLUENZA VACCINE  06/03/2024   DTaP/Tdap/Td (2 - Tdap) 03/11/2025 (Originally 11/04/2023)   Medicare Annual Wellness (AWV)  06/10/2025   Pneumococcal Vaccine: 50+ Years  Completed   Hepatitis C Screening  Completed   Zoster Vaccines- Shingrix   Completed   Hepatitis B Vaccines  Aged Out   HPV VACCINES  Aged Out   Meningococcal B Vaccine  Aged Out    Health Maintenance  Health Maintenance Due   Topic Date Due   COVID-19 Vaccine (6 - 2024-25 season) 02/29/2024   Colonoscopy  05/02/2024   Lung Cancer Screening  06/01/2024   INFLUENZA VACCINE  06/03/2024   Health Maintenance Items Addressed:   Additional Screening:  Vision Screening: Recommended annual ophthalmology exams for early detection of glaucoma and other disorders of the eye. Would you like a referral to an eye doctor? No    Dental Screening: Recommended annual dental exams for proper oral hygiene  Community Resource Referral / Chronic Care Management: CRR required this visit?  No   CCM required this visit?  No   Plan:    I have personally reviewed and noted the following in the patient's chart:   Medical and social history Use of alcohol, tobacco or illicit drugs  Current medications and supplements including opioid prescriptions. Patient is not currently taking opioid prescriptions. Functional ability and status Nutritional status Physical activity Advanced directives List of other physicians Hospitalizations, surgeries, and ER visits in previous 12 months Vitals Screenings to include cognitive, depression, and falls Referrals and appointments  In addition, I have reviewed and discussed with patient certain preventive protocols, quality metrics, and best practice recommendations. A written personalized care plan for preventive services as well as general preventive health recommendations were provided to patient.   Lyle MARLA Right, [place]   06/10/2024   After Visit Summary: (MyChart) Due to this being a telephonic visit, the after visit summary with patients personalized plan was offered to patient via MyChart   Notes: Nothing significant to report at this time.

## 2024-06-10 NOTE — Patient Instructions (Signed)
 Manuel [name] , Thank you for taking time out of your busy schedule to complete your Annual Wellness Visit with me. I enjoyed our conversation and look forward to speaking with you again next year. I, as well as your care team,  appreciate your ongoing commitment to your health goals. Please review the following plan we discussed and let me know if I can assist you in the future. Your Game plan/ To Do List    Referrals: If you haven't heard from the office you've been referred to, please reach out to them at the phone provided.   Follow up Visits: We will see or speak with you next year for your Next Medicare AWV with our clinical staff Have you seen your provider in the last 6 months (3 months if uncontrolled diabetes)? No  Clinician Recommendations:  Aim for 30 minutes of exercise or brisk walking, 6-8 glasses of water , and 5 servings of fruits and vegetables each day.       This is a list of the screenings recommended for you:  Health Maintenance  Topic Date Due   COVID-19 Vaccine (6 - 2024-25 season) 02/29/2024   Colon Cancer Screening  05/02/2024   Screening for Lung Cancer  06/01/2024   Flu Shot  06/03/2024   DTaP/Tdap/Td vaccine (2 - Tdap) 03/11/2025*   Medicare Annual Wellness Visit  06/10/2025   Pneumococcal Vaccine for age over 67  Completed   Hepatitis C Screening  Completed   Zoster (Shingles) Vaccine  Completed   Hepatitis B Vaccine  Aged Out   HPV Vaccine  Aged Out   Meningitis B Vaccine  Aged Out  *Topic was postponed. The date shown is not the original due date.    Advanced directives: (Copy Requested) Please bring a copy of your health care power of attorney and living will to the office to be added to your chart at your convenience. You can mail to [facility] [address] [place], [place] [address] or email to [email or web address] Advance Care Planning is important because it:  [x]  Makes sure you receive the medical care that is consistent  with your values, goals, and preferences  [x]  It provides guidance to your family and loved ones and reduces their decisional burden about whether or not they are making the right decisions based on your wishes.  Follow the link provided in your after visit summary or read over the paperwork we have mailed to you to help you started getting your Advance Directives in place. If you need assistance in completing these, please reach out to us  so that we can help you!  See attachments for Preventive Care and Fall Prevention Tips.

## 2024-06-20 ENCOUNTER — Other Ambulatory Visit: Payer: Self-pay | Admitting: Family Medicine

## 2024-06-20 DIAGNOSIS — F339 Major depressive disorder, recurrent, unspecified: Secondary | ICD-10-CM

## 2024-06-20 DIAGNOSIS — Z636 Dependent relative needing care at home: Secondary | ICD-10-CM

## 2024-06-21 ENCOUNTER — Other Ambulatory Visit: Payer: Self-pay | Admitting: Acute Care

## 2024-06-21 DIAGNOSIS — Z122 Encounter for screening for malignant neoplasm of respiratory organs: Secondary | ICD-10-CM

## 2024-06-21 DIAGNOSIS — Z87891 Personal history of nicotine dependence: Secondary | ICD-10-CM

## 2024-06-21 DIAGNOSIS — F17210 Nicotine dependence, cigarettes, uncomplicated: Secondary | ICD-10-CM

## 2024-06-22 ENCOUNTER — Telehealth: Payer: Self-pay | Admitting: Acute Care

## 2024-06-22 NOTE — Telephone Encounter (Signed)
 Message received from contact center to call patient to schedule annual LDCt for himself and for spouse.  Returned call and left VM with our direct line

## 2024-06-22 NOTE — Telephone Encounter (Signed)
 Requested by interface surescripts. Future visit 09/12/24. Requested Prescriptions  Pending Prescriptions Disp Refills   DULoxetine  (CYMBALTA ) 30 MG capsule [Pharmacy Med Name: DULOXETINE  HCL 30 MG CAP] 90 capsule 0    Sig: TAKE 1 CAPSULE BY MOUTH ONCE DAILY     Psychiatry: Antidepressants - SNRI - duloxetine  Failed - 06/22/2024  2:04 PM      Failed - Cr in normal range and within 360 days    Creat  Date Value Ref Range Status  03/14/2024 1.40 (H) 0.70 - 1.35 mg/dL Final         Passed - eGFR is 30 or above and within 360 days    GFR, Est African American  Date Value Ref Range Status  10/04/2020 62 > OR = 60 mL/min/1.46m2 Final   GFR, Est Non African American  Date Value Ref Range Status  10/04/2020 53 (L) > OR = 60 mL/min/1.35m2 Final   eGFR  Date Value Ref Range Status  03/14/2024 56 (L) > OR = 60 mL/min/1.27m2 Final         Passed - Completed PHQ-2 or PHQ-9 in the last 360 days      Passed - Last BP in normal range    BP Readings from Last 1 Encounters:  06/10/24 128/72         Passed - Valid encounter within last 6 months    Recent Outpatient Visits           3 months ago Mixed hyperlipidemia   [facility] Leavy Mole, PA-C       Future Appointments             In 1 month McGowan, Clotilda DELENA RIGGERS [facility] [place]

## 2024-06-28 NOTE — Telephone Encounter (Signed)
 Returned call from VM re: scheduling LDCT.  Left Vm for call back and requested a good time to call if he leaves another message

## 2024-07-12 ENCOUNTER — Ambulatory Visit

## 2024-07-13 DIAGNOSIS — N1831 Chronic kidney disease, stage 3a: Secondary | ICD-10-CM | POA: Diagnosis not present

## 2024-07-13 DIAGNOSIS — I129 Hypertensive chronic kidney disease with stage 1 through stage 4 chronic kidney disease, or unspecified chronic kidney disease: Secondary | ICD-10-CM | POA: Diagnosis not present

## 2024-07-13 DIAGNOSIS — R319 Hematuria, unspecified: Secondary | ICD-10-CM | POA: Diagnosis not present

## 2024-07-13 DIAGNOSIS — Z720 Tobacco use: Secondary | ICD-10-CM | POA: Diagnosis not present

## 2024-07-14 ENCOUNTER — Other Ambulatory Visit

## 2024-07-15 ENCOUNTER — Ambulatory Visit: Admitting: Urology

## 2024-07-18 ENCOUNTER — Other Ambulatory Visit: Payer: Self-pay | Admitting: Family Medicine

## 2024-07-18 ENCOUNTER — Ambulatory Visit
Admission: RE | Admit: 2024-07-18 | Discharge: 2024-07-18 | Disposition: A | Discharge status: Home / Self Care | Source: Ambulatory Visit | Attending: Acute Care | Admitting: Acute Care

## 2024-07-18 ENCOUNTER — Encounter: Payer: Self-pay | Admitting: Family Medicine

## 2024-07-18 DIAGNOSIS — Z122 Encounter for screening for malignant neoplasm of respiratory organs: Secondary | ICD-10-CM | POA: Insufficient documentation

## 2024-07-18 DIAGNOSIS — F17210 Nicotine dependence, cigarettes, uncomplicated: Secondary | ICD-10-CM | POA: Diagnosis not present

## 2024-07-18 DIAGNOSIS — Z87891 Personal history of nicotine dependence: Secondary | ICD-10-CM | POA: Diagnosis not present

## 2024-07-18 DIAGNOSIS — R634 Abnormal weight loss: Secondary | ICD-10-CM

## 2024-07-18 NOTE — Progress Notes (Unsigned)
 Pt losing weight, here with his wife  Not really eating, not cooking as much, pt working more and both Manuel [name] and his wife losing weight  He agreed to be weighed in clinic and weight was 137 He has CT chest the LDCT lung cancer screening today Wt Readings from Last 5 Encounters:  06/10/24 131 lb (59.4 kg)  03/14/24 145 lb (65.8 kg)  01/22/24 160 lb (72.6 kg)  11/06/23 160 lb (72.6 kg)  09/16/23 160 lb 6.4 oz (72.8 kg)   BMI Readings from Last 5 Encounters:  06/10/24 18.80 kg/m  03/14/24 20.81 kg/m  01/22/24 22.96 kg/m  11/06/23 22.96 kg/m  09/16/23 23.02 kg/m   Lab Results  Component Value Date   PSA1 1.8 01/18/2024   PSA1 1.5 01/09/2023   PSA1 1.4 01/02/2022   PSA 1.35 09/16/2023   denies urinary sx  He states chronic back pain is getting a little bit better Getting chest CT today Will get PSA today with shannon McGowen has labs scheduled this week and f/up appt next week Lost about 25-30 lbs  Got notice from Dr. Douglas recently, checked on pt today while he was in clinic, ensured he had close f/up and kept appt.   Discussed concerns, weight, and things to check or monitor with pt today He will start to decrease his work hours and strenuous activity soon with weather changes/winter and he expects to be able to eat more and gain back some weight.  We discussed increasing calories, I offered to do referral to [facility]/SW or try to order ensure or similar for supplemental calories/protein/nutrition but he declined today  Michelene Cower, PA-C 07/18/2024

## 2024-07-19 DIAGNOSIS — H2513 Age-related nuclear cataract, bilateral: Secondary | ICD-10-CM | POA: Diagnosis not present

## 2024-07-19 DIAGNOSIS — H5203 Hypermetropia, bilateral: Secondary | ICD-10-CM | POA: Diagnosis not present

## 2024-07-19 DIAGNOSIS — H52223 Regular astigmatism, bilateral: Secondary | ICD-10-CM | POA: Diagnosis not present

## 2024-07-19 DIAGNOSIS — H524 Presbyopia: Secondary | ICD-10-CM | POA: Diagnosis not present

## 2024-07-21 ENCOUNTER — Encounter: Payer: Self-pay | Admitting: Urology

## 2024-07-22 ENCOUNTER — Other Ambulatory Visit

## 2024-07-22 DIAGNOSIS — N138 Other obstructive and reflux uropathy: Secondary | ICD-10-CM | POA: Diagnosis not present

## 2024-07-22 DIAGNOSIS — N529 Male erectile dysfunction, unspecified: Secondary | ICD-10-CM

## 2024-07-23 LAB — PSA: Prostate Specific Ag, Serum: 1.4 ng/mL (ref 0.0–4.0)

## 2024-07-26 ENCOUNTER — Other Ambulatory Visit: Payer: Self-pay

## 2024-07-26 DIAGNOSIS — Z122 Encounter for screening for malignant neoplasm of respiratory organs: Secondary | ICD-10-CM

## 2024-07-26 DIAGNOSIS — F17210 Nicotine dependence, cigarettes, uncomplicated: Secondary | ICD-10-CM

## 2024-07-26 DIAGNOSIS — Z87891 Personal history of nicotine dependence: Secondary | ICD-10-CM

## 2024-07-26 NOTE — Progress Notes (Unsigned)
 07/28/2024  9:16 PM   Manuel [name] 25-Apr-1956 [id number]  Referring provider: Leavy Mole, PA-C [address] [place],  [place] [address]   Urological history: 1. Elevated PSA -PSA (03/2019) 5.1  -Prostate MRI on 07/25/2019 revealed a small PI-RADS category 4 lesion in the right posterolateral peripheral zone of the prostate apex is present. Targeting data sent to [facility].  Considerable benign prostatic hypertrophy, prostate volume 78.53 cubic cm.  Thin peripheral zone with generalized low T2 signal, probably postinflammatory.  Sigmoid colon diverticulosis. 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 BPH with LU TS - PSA (07/2024) 1.4 -s/p HoLEP 04/2021  3. High risk hematuria -smoker -work up in 2013.  He underwent a CT Urogram, cystoscopy with bilateral retrogrades and ultmately a TURP with Dr. Charles at [facility].  No malignancies were discovered.  Hematuria was from his enlarged, friable prostate.  He has not had any gross hematuria since that time.  A too small to characterize lesion was seen in the left midpole of the kidney on the CT Urogram in 2013.   RUS 05/2019 Increased echogenicity in the cortex of both kidneys is consistent with medical renal disease. No other acute abnormalities identified -cysto 10/2019 no malignancy found  -CTU 2022 and cysto 2022 negative for malignancy  4. ED -contributing factors of age, smoking, BPH, CAD, HLD, depression and antidepressents -sildenafil  20 mg, 3 to 5 tablets prior to intercourse  No chief complaint on file.   HPI: Manuel [name] is a 68 y.o.  male who presents today for a 6 month follow up.  Previous records reviewed.     I PSS ***  He reports sensation of incomplete bladder emptying, urinary frequency, urinary intermittency, urinary urgency, a weak urinary stream, having to strain to void, nocturia x ***, leaking before being able to reach  the restroom, leaking with coughing, leaking without awareness, and post void dribbling.     He is wearing *** pads//depends  daily.    Patient denies any modifying or aggravating factors.  Patient denies any recent UTI's, gross hematuria, dysuria or suprapubic/flank pain.  Patient denies any fevers, chills, nausea or vomiting.  ***  He has a family history of PCa, colon cancer, ovarian cancer and/or breast cancer with ***.   He does not have a family history of PCa, colon cancer, ovarian cancer, and/or breast cancer .***     UA***  PVR***  PSA (07/2024) 1.4  Serum creatinine (07/2024) 1.24, eGFR 64  Hemoglobin A1c (03/2024) 6.1  SHIM ***  He does not have confidence that he could get and keep an erection, his erections are not firm enough for penetrative intercourse, he has difficulty maintaining his erections,  and he is not finding intercourse satisfactory for him.  ***  Patient still having spontaneous erections.  ***   He denies any pain or curvature with erections.    He is not able to ejaculate, has pain with ejaculation, and has blood in his ejaculate fluid.    Testosterone level ***  Cholesterol (03/2024) 228  Hemoglobin A1c (03/2024) 6.1  TSH (09/2023) 0.95   Tried and failed ***   PMH: Past Medical History:  Diagnosis Date   Anemia    Anxiety    Aortic atherosclerosis 01/11/2018   CT scan Feb 2019   Asthma    Bilateral carpal tunnel syndrome    Bilateral hand numbness    BPH without urinary obstruction  Cervical spondylosis without myelopathy    Chronic kidney disease    STAGE 3   Complete tear of rotator cuff    bilateral   COPD (chronic obstructive pulmonary disease) (HCC)    Depression    Emphysema lung (HCC) 01/11/2018   Chest CT Feb 2019   History of stomach ulcers    HLD (hyperlipidemia)    LVH (left ventricular hypertrophy)    Personal history of tobacco use, presenting hazards to health 12/21/2015   Poor dentition    Sickle cell trait     Sleep apnea    Smoker     Surgical History: Past Surgical History:  Procedure Laterality Date   CARPAL TUNNEL RELEASE Left    COLONOSCOPY WITH PROPOFOL  N/A 12/25/2015   Procedure: COLONOSCOPY WITH PROPOFOL ;  Surgeon: Rogelia Copping, MD;  Location: [facility];  Service: Endoscopy;  Laterality: N/A;   COLONOSCOPY WITH PROPOFOL  N/A 05/02/2021   Procedure: COLONOSCOPY WITH PROPOFOL ;  Surgeon: Copping Rogelia, MD;  Location: [facility];  Service: Endoscopy;  Laterality: N/A;   CYSTOURETHROSCOPY  08/25/12   with ureteral cathization w/wo retrograde pyelogram   HOLEP-LASER ENUCLEATION OF THE PROSTATE WITH MORCELLATION N/A 04/05/2021   Procedure: HOLEP-LASER ENUCLEATION OF THE PROSTATE WITH MORCELLATION;  Surgeon: Francisca Redell BROCKS, MD;  Location: [facility];  Service: Urology;  Laterality: N/A;   POLYPECTOMY N/A 05/02/2021   Procedure: POLYPECTOMY;  Surgeon: Copping Rogelia, MD;  Location: [facility];  Service: Endoscopy;  Laterality: N/A;   ROTATOR CUFF REPAIR Right 01/25/15   SHOULDER ARTHROSCOPY WITH SUBACROMIAL DECOMPRESSION Right [id number]   TRANSURETHRAL RESECTION OF PROSTATE  08/25/12    Home Medications:  Allergies as of 07/28/2024       Reactions   Penicillin G Other (See Comments)   Childhood        Medication List        Accurate as of July 26, 2024  9:16 PM. If you have any questions, ask your nurse or doctor.          albuterol  108 (90 Base) MCG/ACT inhaler Commonly known as: VENTOLIN  HFA Inhale 2 puffs into the lungs every 6 (six) hours as needed for wheezing or shortness of breath.   Baclofen  5 MG Tabs Take 1-2 tablets (5-10 mg total) by mouth 3 (three) times daily as needed (msk pain or spasms).   benzonatate  100 MG capsule Commonly known as: TESSALON  Take 1 capsule (100 mg total) by mouth 2 (two) times daily as needed for cough.   budesonide -formoterol  160-4.5 MCG/ACT inhaler Commonly known as: SYMBICORT  Inhale 2 puffs into the lungs 2 (two)  times daily. And can use as rescue/PRN 2 puffs up to 4 times a day for wheeze or cough, max 12 puffs in 24 hours   diphenhydramine-acetaminophen  25-500 MG Tabs tablet Commonly known as: TYLENOL  PM Take 2 tablets by mouth at bedtime as needed (pain).   DULoxetine  30 MG capsule Commonly known as: CYMBALTA  TAKE 1 CAPSULE BY MOUTH ONCE DAILY   rosuvastatin  20 MG tablet Commonly known as: Crestor  Take 1 tablet (20 mg total) by mouth daily.   sildenafil  20 MG tablet Commonly known as: REVATIO  Take 3 to 5 tablets two hours before intercouse on an empty stomach.  Do not take with nitrates.   vitamin B-12 500 MCG tablet Commonly known as: CYANOCOBALAMIN  Take 2 tablets (1,000 mcg total) by mouth daily.        Allergies:  Allergies  Allergen Reactions   Penicillin G Other (See Comments)  Childhood    Family History: Family History  Problem Relation Age of Onset   Aneurysm Mother    Hypertension Mother    Diabetes Father    Hypertension Father    Prostate cancer Father    Lupus Father    Cancer Maternal Aunt    Cancer Maternal Uncle    Diabetes Paternal Uncle    Heart disease Cousin    Prostate cancer Paternal Uncle    COPD Neg Hx    Stroke Neg Hx    Kidney disease Neg Hx     Social History:  reports that he has been smoking cigarettes. He has a 35 pack-year smoking history. He has never used smokeless tobacco. He reports that he does not drink alcohol and does not use drugs.  ROS: For pertinent review of systems please refer to history of present illness  Physical Exam: There were no vitals taken for this visit.  Constitutional:  Well nourished. Alert and oriented, No acute distress. HEENT: [place] AT, moist mucus membranes.  Trachea midline, no masses. Cardiovascular: No clubbing, cyanosis, or edema. Respiratory: Normal respiratory effort, no increased work of breathing. GI: Abdomen is soft, non tender, non distended, no abdominal masses. Liver and spleen not  palpable.  No hernias appreciated.  Stool sample for occult testing is not indicated.   GU: No CVA tenderness.  No bladder fullness or masses.  Patient with circumcised/uncircumcised phallus. ***Foreskin easily retracted***  Urethral meatus is patent.  No penile discharge. No penile lesions or rashes. Scrotum without lesions, cysts, rashes and/or edema.  Testicles are located scrotally bilaterally. No masses are appreciated in the testicles. Left and right epididymis are normal. Rectal: Patient with  normal sphincter tone. Anus and perineum without scarring or rashes. No rectal masses are appreciated. Prostate is approximately *** grams, *** nodules are appreciated. Seminal vesicles are normal. Skin: No rashes, bruises or suspicious lesions. Lymph: No cervical or inguinal adenopathy. Neurologic: Grossly intact, no focal deficits, moving all 4 extremities. Psychiatric: Normal mood and affect.   Laboratory Data: See EPIC and HPI I have reviewed the labs.  Pertinent Imaging N/A   Assessment & Plan:    1. BPH with LU TS -PSA stable -DRE benign -UA benign -symptoms - none  2. Erectile dysfunction -continue sildenafil  20 mg, on-demand-dosing  3. High risk hematuria -work up completed in 2013 and 2022 - positive for an enlarged prostate -no reports of gross hematuria -UA ***  No follow-ups on file.  CLOTILDA HELON RIGGERS  [facility] [address], [address] [place], [place] [address] [phone]

## 2024-07-28 ENCOUNTER — Encounter: Payer: Self-pay | Admitting: Urology

## 2024-07-28 ENCOUNTER — Ambulatory Visit (INDEPENDENT_AMBULATORY_CARE_PROVIDER_SITE_OTHER): Admitting: Urology

## 2024-07-28 VITALS — BP 156/79 | HR 57 | Ht 69.0 in | Wt 137.9 lb

## 2024-07-28 DIAGNOSIS — N529 Male erectile dysfunction, unspecified: Secondary | ICD-10-CM

## 2024-07-28 DIAGNOSIS — R319 Hematuria, unspecified: Secondary | ICD-10-CM | POA: Diagnosis not present

## 2024-07-28 DIAGNOSIS — N401 Enlarged prostate with lower urinary tract symptoms: Secondary | ICD-10-CM

## 2024-07-28 DIAGNOSIS — N138 Other obstructive and reflux uropathy: Secondary | ICD-10-CM | POA: Diagnosis not present

## 2024-07-28 LAB — URINALYSIS, COMPLETE
Bilirubin, UA: NEGATIVE
Glucose, UA: NEGATIVE
Ketones, UA: NEGATIVE
Leukocytes,UA: NEGATIVE
Nitrite, UA: NEGATIVE
Protein,UA: NEGATIVE
RBC, UA: NEGATIVE
Specific Gravity, UA: 1.01 (ref 1.005–1.030)
Urobilinogen, Ur: 0.2 mg/dL (ref 0.2–1.0)
pH, UA: 6 (ref 5.0–7.5)

## 2024-07-28 LAB — MICROSCOPIC EXAMINATION
Epithelial Cells (non renal): >10 /HPF — AB (ref 0–10)
RBC, Urine: NONE SEEN /HPF (ref 0–2)

## 2024-07-28 MED ORDER — SILDENAFIL CITRATE 20 MG PO TABS: ORAL_TABLET | ORAL | 3 refills | Status: AC

## 2024-07-29 ENCOUNTER — Ambulatory Visit: Admitting: Urology

## 2024-08-09 DIAGNOSIS — M7542 Impingement syndrome of left shoulder: Secondary | ICD-10-CM | POA: Diagnosis not present

## 2024-08-09 DIAGNOSIS — M7541 Impingement syndrome of right shoulder: Secondary | ICD-10-CM | POA: Diagnosis not present

## 2024-09-12 ENCOUNTER — Ambulatory Visit: Admitting: Family Medicine

## 2024-09-13 ENCOUNTER — Other Ambulatory Visit: Payer: Self-pay | Admitting: Family Medicine

## 2024-09-13 DIAGNOSIS — F339 Major depressive disorder, recurrent, unspecified: Secondary | ICD-10-CM

## 2024-09-13 DIAGNOSIS — Z636 Dependent relative needing care at home: Secondary | ICD-10-CM

## 2024-09-15 NOTE — Telephone Encounter (Signed)
 Requested Prescriptions  Pending Prescriptions Disp Refills   DULoxetine  (CYMBALTA ) 30 MG capsule [Pharmacy Med Name: DULOXETINE  HCL 30 MG CAP] 90 capsule 0    Sig: TAKE 1 CAPSULE BY MOUTH ONCE DAILY     Psychiatry: Antidepressants - SNRI - duloxetine  Failed - 09/15/2024  1:36 PM      Failed - Cr in normal range and within 360 days    Creat  Date Value Ref Range Status  03/14/2024 1.40 (H) 0.70 - 1.35 mg/dL Final         Failed - Last BP in normal range    BP Readings from Last 1 Encounters:  07/28/24 (!) 156/79         Passed - eGFR is 30 or above and within 360 days    GFR, Est African American  Date Value Ref Range Status  10/04/2020 62 > OR = 60 mL/min/1.3m2 Final   GFR, Est Non African American  Date Value Ref Range Status  10/04/2020 53 (L) > OR = 60 mL/min/1.5m2 Final   eGFR  Date Value Ref Range Status  03/14/2024 56 (L) > OR = 60 mL/min/1.80m2 Final         Passed - Completed PHQ-2 or PHQ-9 in the last 360 days      Passed - Valid encounter within last 6 months    Recent Outpatient Visits           6 months ago Mixed hyperlipidemia   [facility] Leavy Mole, PA-C       Future Appointments             In 4 months McGowan, Clotilda DELENA RIGGERS [facility] [place]

## 2024-11-30 ENCOUNTER — Encounter: Admitting: Family Medicine

## 2025-01-25 ENCOUNTER — Ambulatory Visit: Admitting: Urology

## 2025-06-16 ENCOUNTER — Ambulatory Visit
